# Patient Record
Sex: Male | Born: 1940 | Race: White | Hispanic: No | Smoking: Former smoker
Health system: Southern US, Community
[De-identification: ages and names within clinical notes are randomized; demographics above are authoritative.]

## PROBLEM LIST (undated history)

## (undated) DIAGNOSIS — I10 Essential (primary) hypertension: Secondary | ICD-10-CM

## (undated) DIAGNOSIS — J189 Pneumonia, unspecified organism: Secondary | ICD-10-CM

## (undated) DIAGNOSIS — H409 Unspecified glaucoma: Secondary | ICD-10-CM

## (undated) DIAGNOSIS — H3530 Unspecified macular degeneration: Secondary | ICD-10-CM

## (undated) DIAGNOSIS — R002 Palpitations: Secondary | ICD-10-CM

## (undated) DIAGNOSIS — R011 Cardiac murmur, unspecified: Secondary | ICD-10-CM

## (undated) DIAGNOSIS — K219 Gastro-esophageal reflux disease without esophagitis: Secondary | ICD-10-CM

## (undated) DIAGNOSIS — I723 Aneurysm of iliac artery: Secondary | ICD-10-CM

## (undated) DIAGNOSIS — I8290 Acute embolism and thrombosis of unspecified vein: Secondary | ICD-10-CM

## (undated) DIAGNOSIS — C4490 Unspecified malignant neoplasm of skin, unspecified: Secondary | ICD-10-CM

## (undated) DIAGNOSIS — I714 Abdominal aortic aneurysm, without rupture, unspecified: Secondary | ICD-10-CM

## (undated) DIAGNOSIS — R001 Bradycardia, unspecified: Secondary | ICD-10-CM

## (undated) DIAGNOSIS — C801 Malignant (primary) neoplasm, unspecified: Secondary | ICD-10-CM

## (undated) DIAGNOSIS — L989 Disorder of the skin and subcutaneous tissue, unspecified: Secondary | ICD-10-CM

## (undated) DIAGNOSIS — R42 Dizziness and giddiness: Secondary | ICD-10-CM

## (undated) DIAGNOSIS — R200 Anesthesia of skin: Secondary | ICD-10-CM

## (undated) DIAGNOSIS — E785 Hyperlipidemia, unspecified: Secondary | ICD-10-CM

## (undated) DIAGNOSIS — G4730 Sleep apnea, unspecified: Secondary | ICD-10-CM

## (undated) DIAGNOSIS — I499 Cardiac arrhythmia, unspecified: Secondary | ICD-10-CM

## (undated) DIAGNOSIS — I493 Ventricular premature depolarization: Secondary | ICD-10-CM

## (undated) DIAGNOSIS — I4891 Unspecified atrial fibrillation: Secondary | ICD-10-CM

## (undated) DIAGNOSIS — R0683 Snoring: Secondary | ICD-10-CM

## (undated) HISTORY — DX: Unspecified glaucoma: H40.9

## (undated) HISTORY — DX: Essential (primary) hypertension: I10

## (undated) HISTORY — DX: Abdominal aortic aneurysm, without rupture: I71.4

## (undated) HISTORY — PX: POLYPECTOMY: SHX149

## (undated) HISTORY — PX: COLONOSCOPY: SHX174

## (undated) HISTORY — DX: Snoring: R06.83

## (undated) HISTORY — DX: Cardiac murmur, unspecified: R01.1

## (undated) HISTORY — DX: Bradycardia, unspecified: R00.1

## (undated) HISTORY — DX: Sleep apnea, unspecified: G47.30

## (undated) HISTORY — PX: SKIN CANCER EXCISION: SHX779

## (undated) HISTORY — DX: Palpitations: R00.2

## (undated) HISTORY — DX: Dizziness and giddiness: R42

## (undated) HISTORY — DX: Disorder of the skin and subcutaneous tissue, unspecified: L98.9

## (undated) HISTORY — DX: Abdominal aortic aneurysm, without rupture, unspecified: I71.40

## (undated) HISTORY — PX: WISDOM TOOTH EXTRACTION: SHX21

## (undated) HISTORY — DX: Hyperlipidemia, unspecified: E78.5

## (undated) HISTORY — PX: TONSILLECTOMY: SHX5217

## (undated) HISTORY — DX: Gastro-esophageal reflux disease without esophagitis: K21.9

## (undated) HISTORY — DX: Unspecified atrial fibrillation: I48.91

---
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
## 1963-12-19 HISTORY — PX: APPENDECTOMY: SHX54

## 2001-12-31 ENCOUNTER — Ambulatory Visit (HOSPITAL_COMMUNITY): Admission: RE | Admit: 2001-12-31 | Discharge: 2001-12-31 | Payer: Self-pay | Admitting: *Deleted

## 2001-12-31 ENCOUNTER — Encounter: Payer: Self-pay | Admitting: *Deleted

## 2002-01-09 ENCOUNTER — Encounter: Payer: Self-pay | Admitting: *Deleted

## 2002-01-09 ENCOUNTER — Ambulatory Visit (HOSPITAL_COMMUNITY): Admission: RE | Admit: 2002-01-09 | Discharge: 2002-01-09 | Payer: Self-pay | Admitting: *Deleted

## 2005-01-31 ENCOUNTER — Ambulatory Visit: Payer: Self-pay | Admitting: *Deleted

## 2005-02-17 ENCOUNTER — Ambulatory Visit: Payer: Self-pay | Admitting: *Deleted

## 2007-07-12 ENCOUNTER — Ambulatory Visit: Payer: Self-pay | Admitting: Cardiovascular Disease

## 2007-07-24 ENCOUNTER — Ambulatory Visit: Payer: Self-pay | Admitting: Cardiovascular Disease

## 2007-07-24 LAB — CONVERTED CEMR LAB
ALT: 23 units/L (ref 0–53)
AST: 26 units/L (ref 0–37)
Albumin: 3.8 g/dL (ref 3.5–5.2)
Alkaline Phosphatase: 81 units/L (ref 39–117)
Bilirubin, Direct: 0.1 mg/dL (ref 0.0–0.3)
Cholesterol: 182 mg/dL (ref 0–200)
HDL: 35.1 mg/dL — ABNORMAL LOW (ref >39.0)
LDL Cholesterol: 122 mg/dL — ABNORMAL HIGH (ref 0–99)
Total Bilirubin: 0.9 mg/dL (ref 0.3–1.2)
Total CHOL/HDL Ratio: 5.2
Total Protein: 7.3 g/dL (ref 6.0–8.3)
Triglycerides: 125 mg/dL (ref 0–149)
VLDL: 25 mg/dL (ref 0–40)

## 2007-10-11 ENCOUNTER — Ambulatory Visit: Payer: Self-pay | Admitting: Cardiovascular Disease

## 2007-10-22 ENCOUNTER — Ambulatory Visit: Payer: Self-pay | Admitting: Gastroenterology

## 2007-11-05 ENCOUNTER — Ambulatory Visit: Payer: Self-pay | Admitting: Gastroenterology

## 2009-06-02 ENCOUNTER — Encounter: Payer: Self-pay | Admitting: Physician Assistant

## 2009-06-02 ENCOUNTER — Telehealth: Payer: Self-pay | Admitting: Cardiovascular Disease

## 2009-06-02 ENCOUNTER — Ambulatory Visit: Payer: Self-pay | Admitting: Cardiology

## 2009-06-02 DIAGNOSIS — E785 Hyperlipidemia, unspecified: Secondary | ICD-10-CM

## 2009-06-02 DIAGNOSIS — R42 Dizziness and giddiness: Secondary | ICD-10-CM

## 2009-06-02 DIAGNOSIS — I10 Essential (primary) hypertension: Secondary | ICD-10-CM | POA: Insufficient documentation

## 2009-06-02 DIAGNOSIS — R002 Palpitations: Secondary | ICD-10-CM

## 2009-06-02 DIAGNOSIS — I679 Cerebrovascular disease, unspecified: Secondary | ICD-10-CM | POA: Insufficient documentation

## 2009-07-15 ENCOUNTER — Ambulatory Visit: Payer: Self-pay | Admitting: Cardiovascular Disease

## 2009-08-31 ENCOUNTER — Ambulatory Visit: Payer: Self-pay | Admitting: Cardiovascular Disease

## 2009-08-31 DIAGNOSIS — I4891 Unspecified atrial fibrillation: Secondary | ICD-10-CM

## 2009-08-31 DIAGNOSIS — R011 Cardiac murmur, unspecified: Secondary | ICD-10-CM

## 2010-04-07 ENCOUNTER — Telehealth: Payer: Self-pay | Admitting: Cardiovascular Disease

## 2010-04-13 ENCOUNTER — Telehealth: Payer: Self-pay | Admitting: Cardiovascular Disease

## 2010-04-20 ENCOUNTER — Ambulatory Visit: Payer: Self-pay | Admitting: Cardiovascular Disease

## 2010-11-11 ENCOUNTER — Encounter (INDEPENDENT_AMBULATORY_CARE_PROVIDER_SITE_OTHER): Payer: Self-pay | Admitting: *Deleted

## 2011-01-05 ENCOUNTER — Ambulatory Visit
Admission: RE | Admit: 2011-01-05 | Discharge: 2011-01-05 | Payer: Self-pay | Source: Home / Self Care | Attending: Cardiovascular Disease | Admitting: Cardiovascular Disease

## 2011-01-05 ENCOUNTER — Encounter: Payer: Self-pay | Admitting: Cardiovascular Disease

## 2011-01-17 NOTE — Assessment & Plan Note (Signed)
Summary: ROV/DIZZINESS AND BP ISSUES/DM   Visit Type:  Follow-up Primary Provider:  Shaune Pollack, MD  CC:  Dizziness/BP issues.  History of Present Illness: Jonathan Lane is seen today for F/U of dizzyness and HTN.  He seems overly concerned about his BP.  He occasionally gets hight readings at home.  In our office he is always non-postural and in the 120 sytolic range.  I did speak with him last time about the effects of ETOH making BP more labile.  He had his prostate biopsy with Dr Cassell Smiles and fortuately his does not have CA.  I believe this stress may have had something to do with it.  He does have some dizzyness with change in postion and I suspect this represents an inner ear problem.  He denies SSCP, diaphoresis, dyspnea ore edema. He has been compliant with his meds  Current Problems (verified): 1)  Cardiac Murmur  (ICD-785.2) 2)  Atrial Fibrillation  (ICD-427.31) 3)  Dizziness  (ICD-780.4) 4)  Bradycardia..relative  (ICD-427.89) 5)  Hypertension, Unspecified  (ICD-401.9) 6)  Hyperlipidemia-mixed  (ICD-272.4) 7)  Palpitations  (ICD-785.1)  Current Medications (verified): 1)  Cardizem Cd 240 Mg Xr24h-Cap (Diltiazem Hcl Coated Beads) .... One Tablet By Mouth Once Daily 2)  Losartan Potassium-Hctz 100-12.5 Mg Tabs (Losartan Potassium-Hctz) .... One Tablet By Mouth Once Daily 3)  Aspir-Low 81 Mg Tbec (Aspirin) .Marland Kitchen.. 1 Tab Morning 4)  Aspirin 325 Mg Tabs (Aspirin) .Marland Kitchen.. 1 Tab By Mouth Once Daily 5)  Coq10 30 Mg Caps (Coenzyme Q10) .... Take One Daily 6)  Compete  Tabs (Multiple Vitamins-Minerals) .... Take One Daily 7)  Lumigan 0.01 % Soln (Bimatoprost) .... Uad 8)  Fish Oil   Oil (Fish Oil) .... Tab By Mouth Once Daily 9)  Pomegranate 250 Mg Caps (Pomegranate (Punica Granatum)) .Marland Kitchen.. 1 Tab By Mouth Once Daily 10)  Cranberry 405 Mg Caps (Cranberry) .Marland Kitchen.. 1 Tab By Mouth Once Daily 11)  Sm Stool Softener 100 Mg Caps (Docusate Sodium) .... Uad 12)  Istalol 0.5 % Soln (Timolol Maleate) ....  Uad 13)  Prostate Health  Caps (Misc Natural Products) .... Once Daily  Allergies (verified): No Known Drug Allergies  Past History:  Past Medical History: Last updated: 06/02/2009 BRADYCARDIA.Marland KitchenRELATIVE (ICD-427.89) HYPERTENSION, UNSPECIFIED (ICD-401.9) HYPERLIPIDEMIA-MIXED (ICD-272.4) PALPITATIONS (ICD-785.1)    Past Surgical History: Last updated: 06/02/2009 Tonsillectomy..either [id number] or 1959 Appendectomy..1965  Family History: Last updated: 06/02/2009 Family History of CVA or Stroke: Mother deceased at 43..stroke, Father deceased at 87.. stroke  Social History: Last updated: 06/02/2009 Retired .Marland Kitchen2000 Married  Tobacco Use - Former. quit 1967 Alcohol Use - yes Regular Exercise - yes Drug Use - no  Review of Systems       Denies fever, malais, weight loss, blurry vision, decreased visual acuity, cough, sputum, SOB, hemoptysis, pleuritic pain, palpitaitons, heartburn, abdominal pain, melena, lower extremity edema, claudication, or rash.   Vital Signs:  Patient profile:   70 year old male Height:      74 inches Weight:      198 pounds BMI:     25.51 Pulse rate:   61 / minute BP sitting:   122 / 70  (left arm) BP standing:   120 / 72  Vitals Entered By: Laurance Flatten CMA (Apr 20, 2010 8:17 AM)  Physical Exam  General:  Affect appropriate Healthy:  appears stated age HEENT: normal Neck supple with no adenopathy JVP normal no bruits no thyromegaly Lungs clear with no wheezing and good diaphragmatic motion Heart:  S1/S2  no murmur,rub, gallop or click PMI normal Abdomen: benighn, BS positve, no tenderness, no AAA no bruit.  No HSM or HJR Distal pulses intact with no bruits No edema Neuro non-focal Skin warm and dry    Impression & Recommendations:  Problem # 1:  HYPERTENSION, UNSPECIFIED (ICD-401.9) Well controlled.  Will correlate his home BP cuff with ours next visit His updated medication list for this problem includes:    Cardizem Cd 240 Mg  Xr24h-cap (Diltiazem hcl coated beads) ..... One tablet by mouth once daily    Losartan Potassium-hctz 100-12.5 Mg Tabs (Losartan potassium-hctz) ..... One tablet by mouth once daily    Aspir-low 81 Mg Tbec (Aspirin) .Marland Kitchen... 1 tab morning    Aspirin 325 Mg Tabs (Aspirin) .Marland Kitchen... 1 tab by mouth once daily  Problem # 2:  DIZZINESS (ICD-780.4) Non-cardiac with no postural symptoms.  Likely related to inner ear or vetigo. F/U primary  Problem # 3:  HYPERLIPIDEMIA-MIXED (ICD-272.4) Last LDL 122.  Continue diet Rx in light of no vascular diseae.  F/U labs in 6 months  Patient Instructions: 1)  Your physician recommends that you schedule a follow-up appointment in: 6 months. 2)  Your physician recommends that you continue on your current medications as directed. Please refer to the Current Medication list given to you today.

## 2011-01-17 NOTE — Progress Notes (Signed)
Summary: pls call spouse  Phone Note Call from Patient Call back at Home Phone [phone]   Caller: Patient Reason for Call: Talk to Nurse, Talk to Doctor Summary of Call: pt will be out so per patient it is ok to talk to spouse Bonita Quin  Initial call taken by: Omer Jack,  April 13, 2010 9:51 AM  Follow-up for Phone Call        spoke with pt wife, she is aware of the med changes suggested by dr Eden Emms. the pt is unavailable at this time to talk with me. appt made for follow up next week per spouse request. will call pt back later today to discuss meds Deliah Goody, RN  April 13, 2010 10:46 AM  spoke with pt, he is willing to make the med changes and will keep his appt next week to follow up. he will call prior to the appt with problems Deliah Goody, RN  April 13, 2010 5:26 PM     New/Updated Medications: LOSARTAN POTASSIUM-HCTZ 100-12.5 MG TABS (LOSARTAN POTASSIUM-HCTZ) one tablet by mouth once daily Prescriptions: LOSARTAN POTASSIUM-HCTZ 100-12.5 MG TABS (LOSARTAN POTASSIUM-HCTZ) one tablet by mouth once daily  #30 x 12   Entered by:   Deliah Goody, RN   Authorized by:   Colon Branch, MD, [facility]   Signed by:   Deliah Goody, RN on 04/13/2010   Method used:   Electronically to        [facility] (retail)       [address]       [place], [place]  [id number]       Ph: [phone]       Fax: [phone]   RxID:   [id number]

## 2011-01-17 NOTE — Letter (Signed)
Summary: Appointment - Missed  [facility], Main Office  [address]   [place], [place] [address]   Phone: [phone]  Fax: [phone]         November 11, 2010 MRN: [id number]       Jonathan Lane [address] [place], [place]  [address]     Dear Jonathan Lane,  Our records indicate you missed your appointment on October 25, 2010 with Dr. Eden Emms.  It is very important that we reach you to reschedule this appointment. We look forward to participating in your health care needs. Please contact us at the number listed above at your earliest convenience to reschedule this appointment.     Sincerely,   Glass blower/designer

## 2011-01-17 NOTE — Progress Notes (Signed)
Summary: b/p today 173/108 - wants to be seen today  Phone Note Call from Patient Call back at Home Phone [phone]   Caller: Patient Reason for Call: Talk to Nurse Summary of Call: B/P today 173/108. at home. dizziness, "feeling something in his heart" . pt on new meds. would like to come into the office if possible.  Initial call taken by: Lorne Skeens,  April 07, 2010 8:39 AM  Follow-up for Phone Call        spoke with pt, he had dizziness yesterday and when he got up this am he also felt dizzy. he took his bp this am and it was 173/108. he has noticed that his bp has been running elevated for the last several weeks. he also states he has noticed a funny feeling in his heart that he associates with his elevated bp. he feels the cardizem is not working as well as it first did. the dizziness the pt is having is when he looks up or when he gets out of bed to go to the bathroom. he states he feels like something is going on inside his head. instructed pt to contact primary care md for eval of vertigo. will foward to dr Eden Emms to review bp meds for titration. Deliah Goody, RN  April 07, 2010 10:54 AM   please call pt @ [phone] [address]  April 07, 2010 1:12 PM Follow-up by: Deliah Goody, RN,  April 07, 2010 9:54 AM  Additional Follow-up for Phone Call Additional follow up Details #1::        Change ramapril to Hyzaar 100/12.5 continue cardiazem.  F/U primary and F/U me 4-6 weeks.  Order carotid duplex Additional Follow-up by: Colon Branch, MD, [facility],  April 08, 2010 11:40 AM     Appended Document: b/p today 173/108 - wants to be seen today Left message to call back

## 2011-01-19 NOTE — Assessment & Plan Note (Signed)
Summary: f27m/mj   Primary Provider:  Shaune Pollack, MD  CC:  CHECK UP.  History of Present Illness: Jonathan Lane is seen today for F/U of dizzyness and HTN.  He seems overly concerned about his BP.  He occasionally gets hight readings at home.  In our office he is always non-postural and in the 120 sytolic range.  I did speak with him last time about the effects of ETOH making BP more labile.  He had his prostate biopsy with Dr Cassell Smiles and fortuately his does not have CA.  I believe this stress may have had something to do with it.  His visual problems and dizzyness are resolved  He denies SSCP, diaphoresis, dyspnea ore edema. He has been compliant with his meds  Has some soreness in the medial aspect of his right elbow "golfers elbow" even though he only plays tennis.  Has decreased ASA to 81 mg.  Suggested Naproxen for NSAI   Current Problems (verified): 1)  Cardiac Murmur  (ICD-785.2) 2)  Atrial Fibrillation  (ICD-427.31) 3)  Dizziness  (ICD-780.4) 4)  Bradycardia..relative  (ICD-427.89) 5)  Hypertension, Unspecified  (ICD-401.9) 6)  Hyperlipidemia-mixed  (ICD-272.4) 7)  Palpitations  (ICD-785.1)  Current Medications (verified): 1)  Cardizem Cd 240 Mg Xr24h-Cap (Diltiazem Hcl Coated Beads) .... One Tablet By Mouth Once Daily 2)  Losartan Potassium-Hctz 100-12.5 Mg Tabs (Losartan Potassium-Hctz) .... One Tablet By Mouth Once Daily 3)  Aspir-Low 81 Mg Tbec (Aspirin) .Marland Kitchen.. 1 Tab Morning 4)  Coq10 30 Mg Caps (Coenzyme Q10) .... Take One Daily 5)  Compete  Tabs (Multiple Vitamins-Minerals) .... Take One Daily 6)  Lumigan 0.01 % Soln (Bimatoprost) .... Uad 7)  Fish Oil   Oil (Fish Oil) .... Tab By Mouth Once Daily 8)  Pomegranate 250 Mg Caps (Pomegranate (Punica Granatum)) .Marland Kitchen.. 1 Tab By Mouth Once Daily 9)  Cranberry 405 Mg Caps (Cranberry) .Marland Kitchen.. 1 Tab By Mouth Once Daily 10)  Sm Stool Softener 100 Mg Caps (Docusate Sodium) .... Uad 11)  Prostate Health  Caps (Misc Natural Products) .... Once  Daily 12)  Combigan 0.2-0.5 % Soln (Brimonidine Tartrate-Timolol) .... As Direected 13)  Valacyclovir Hcl 1 Gm Tabs (Valacyclovir Hcl) .... As Needed  Allergies (verified): No Known Drug Allergies  Past History:  Past Medical History: Last updated: 06/02/2009 BRADYCARDIA.Marland KitchenRELATIVE (ICD-427.89) HYPERTENSION, UNSPECIFIED (ICD-401.9) HYPERLIPIDEMIA-MIXED (ICD-272.4) PALPITATIONS (ICD-785.1)    Past Surgical History: Last updated: 06/02/2009 Tonsillectomy..either [id number] or 1959 Appendectomy..1965  Family History: Last updated: 06/02/2009 Family History of CVA or Stroke: Mother deceased at 75..stroke, Father deceased at 62.. stroke  Social History: Last updated: 06/02/2009 Retired .Marland Kitchen2000 Married  Tobacco Use - Former. quit 1967 Alcohol Use - yes Regular Exercise - yes Drug Use - no  Review of Systems       Denies fever, malais, weight loss, blurry vision, decreased visual acuity, cough, sputum, SOB, hemoptysis, pleuritic pain, palpitaitons, heartburn, abdominal pain, melena, lower extremity edema, claudication, or rash.   Vital Signs:  Patient profile:   70 year old male Height:      74 inches Weight:      204 pounds BMI:     26.29 Pulse rate:   57 / minute Pulse (ortho):   64 / minute Resp:     14 per minute BP sitting:   130 / 72  (left arm) BP standing:   132 / 70  Vitals Entered By: Kem Parkinson (January 05, 2011 4:31 PM)  Physical Exam  General:  Affect appropriate Healthy:  appears  stated age HEENT: normal Neck supple with no adenopathy JVP normal no bruits no thyromegaly Lungs clear with no wheezing and good diaphragmatic motion Heart:  S1/S2 no murmur,rub, gallop or click PMI normal Abdomen: benighn, BS positve, no tenderness, no AAA no bruit.  No HSM or HJR Distal pulses intact with no bruits No edema Neuro non-focal Skin warm and dry    Impression & Recommendations:  Problem # 1:  ATRIAL FIBRILLATION (ICD-427.31) Maint NSR The  following medications were removed from the medication list:    Aspirin 325 Mg Tabs (Aspirin) .Marland Kitchen... 1 tab by mouth once daily His updated medication list for this problem includes:    Aspir-low 81 Mg Tbec (Aspirin) .Marland Kitchen... 1 tab morning  Problem # 2:  DIZZINESS (ICD-780.4) Resolved likely related to inner ear.  Not postural and BP under adequate control  Problem # 3:  HYPERTENSION, UNSPECIFIED (ICD-401.9) Well controlled The following medications were removed from the medication list:    Aspirin 325 Mg Tabs (Aspirin) .Marland Kitchen... 1 tab by mouth once daily His updated medication list for this problem includes:    Cardizem Cd 240 Mg Xr24h-cap (Diltiazem hcl coated beads) ..... One tablet by mouth once daily    Losartan Potassium-hctz 100-12.5 Mg Tabs (Losartan potassium-hctz) ..... One tablet by mouth once daily    Aspir-low 81 Mg Tbec (Aspirin) .Marland Kitchen... 1 tab morning  Problem # 4:  HYPERLIPIDEMIA-MIXED (ICD-272.4) At goal with no side effects CHOL: 182 (07/24/2007)   LDL: 122 (07/24/2007)   HDL: 35.1 (07/24/2007)   TG: 125 (07/24/2007)  Problem # 5:  PALPITATIONS (ICD-785.1) Benign  Continue Calcium blocker The following medications were removed from the medication list:    Aspirin 325 Mg Tabs (Aspirin) .Marland Kitchen... 1 tab by mouth once daily His updated medication list for this problem includes:    Cardizem Cd 240 Mg Xr24h-cap (Diltiazem hcl coated beads) ..... One tablet by mouth once daily    Aspir-low 81 Mg Tbec (Aspirin) .Marland Kitchen... 1 tab morning  Patient Instructions: 1)  Your physician wants you to follow-up in: 6 MONTHS  You will receive a reminder letter in the mail two months in advance. If you don't receive a letter, please call our office to schedule the follow-up appointment.

## 2011-05-02 NOTE — Assessment & Plan Note (Signed)
[facility]                            CARDIOLOGY OFFICE NOTE   NAME:Masterson, ROBERTA KELLY                    MRN:          [id number]  DATE:07/12/2007                            DOB:          31-Jul-1941    Mr. Carriere is seen today as a new patient.  He is referred by Dr.  Georgina Pillion for evaluation of hypertension, hypercholesterolemia, and  relative bradycardia.   The patient has previously been seen by Dr. Kennyth Arnold back in 2006.   He has not had a recent stress test.  The patient has had hypertension  for at least 30 years.  He has been maintained on atenolol and Ramipril.   He takes his blood pressure at home.  In general he has noticed that his  pulse has been fairly low and his blood pressure can spike during  exercise.  His weight is up a little bit.  He wants to increase his  exercise activity.   The patient has no documented coronary artery disease.  He is a  nonsmoker, having quit in 1967.  He is a nondiabetic.  His cholesterol  has been elevated in the past.  I do not have any recent lab work on  him.  He said that he started taking Fish oil a couple of years ago and  this had markedly improved his memory and that was all he was taking for  his cholesterol.   In talking to the patient, he is retired.  His activity level has been  somewhat low.  He is trying to increase it, particularly by walking and  playing tennis.   He wanted to square away his blood pressure medicines before he did any  more activities.   His review of systems otherwise negative.   PAST MEDICAL HISTORY:  Is fairly benign.  He has had  hypercholesterolemia, hypertension, previous smoking, tonsillectomy and  appendectomy.   He is retired. He is married to [facility] who is also a patient of  mine.  He retired in 2000.  He likes to play tennis.  He is a nonsmoker,  nondrinker.   FAMILY HISTORY:  Remarkable for a mother dying at age 65 of a stroke.  Father  dying at age 70 of a stroke.   He is currently taking:  1. Atenolol 50 daily.  2. Ramipril 10 daily.  3. Coenzyme Q.  4. Fish oil.  5. Aspirin daily.  6. Cranberry extract.  7. Zinc.   He denies any allergies.   Review of systems otherwise negative.   EXAMINATION:  Is remarkable for a healthy appearing middle-aged white  male in no distress.  His respiratory rate is 14, pulse is quite low at 49.  His weight is  equal to 207.  Blood pressure is 126/72.  He is afebrile.  HEENT:  Normal.  Carotids normal without bruit.  There is no lymphadenopathy, no  thyromegaly, no JVP elevation.  LUNGS:  Clear.  Good diaphragmatic motion, no wheezing.  There is an S1, S2 with normal heart sounds, PMI is normal.  ABDOMEN:  Benign. There is no AAA,  no tenderness.  Bowel sounds are  positive. There is no hepatosplenomegaly, hepatojugular reflex.  Distal  pulses are intact with no edema.  PTs are +3, femorals are +3.  NEURO:  Nonfocal.  There is no muscular weakness.   His baseline EKG shows sinus bradycardia at a rate of 49 with low atrial  focus, otherwise normal.   IMPRESSION:  1. Hypertension with relative bradycardia.  Decrease atenolol to 25 a      day, increase Ramipril to 10 b.i.d.  Follow up in 8 to 10 weeks.  2. Relative bradycardia secondary to beta blockade.  I think that this      is probably giving him some chronotropic incompetence and fatigue      with exercise.  Since he wants to improve his exercise tolerance, I      think it is reasonable to lower his beta blocker and note number 1,      to compensate we will increase his Ramipril for blood pressure      control.  3. History of hypercholesterolemia, not on statin.  Fish oil is fine      but we need to recheck a fasting lipid and liver profile.  4. Previous issues in regards to muscle cramps, no evidence of      claudication or peripheral vascular disease.  Continue coenzyme Q      as he feels better on it.  5. Stroke  prophylaxis.  Continue baby aspirin a day.   I will see him back in 8 to 10 weeks to further assess his blood  pressure and heart rate and talk to him about his cholesterol.     Noralyn Pick. Eden Emms, MD, [facility]  Electronically Signed    PCN/MedQ  DD: 07/12/2007  DT: 07/12/2007  Job #: [id number]

## 2011-05-02 NOTE — Assessment & Plan Note (Signed)
[facility]                            CARDIOLOGY OFFICE NOTE   NAME:Jonathan Lane, Jonathan Lane                    MRN:          [id number]  DATE:10/11/2007                            DOB:          05/07/1941    Jonathan Lane returns today for followup.  I followed him for  palpitations, hypercholesterolemia, and hypertension.  His biggest issue  has been relative bradycardia and his competitive tennis playing.  His  weight has been stable.  He is watching his salt.  The last time I saw  him his heart rate was 48.  We cut his atenolol back to 25 mg a day and  put him on ramipril 10 mg a day.  He seems to be doing well with this.   REVIEW OF SYSTEMS:  He has not had any lightheadedness, palpitations,  PND, or orthopnea.  He is due to have Dr. Georgina Pillion check his lipids again.  He prefers to take fish oil alone.  Review of systems otherwise  negative.   CURRENT MEDICATIONS:  1. Coenzyme-Q.  2. An aspirin a day.  3. Cranberry juice.  4. Ramipril 10 a day.  5. Atenolol 25 a day.  I suggested that he may take his atenolol at      night, rather than be a little sluggish during his tennis      tournaments.   PHYSICAL EXAMINATION:  VITAL SIGNS:  His weight is 206, blood pressure  is 116/66, pulse is up 58, afebrile, respiratory rate 14.  HEENT:  Normal.  NECK:  Carotids normal without bruit.  No lymphadenopathy.  No  thyromegaly.  No JVP elevation.  HEART:  S1 S2 with normal heart sounds.  LUNGS:  Clear with good diaphragmatic motion.  ABDOMEN:  Benign.  No renal bruits.  No tenderness.  Bowel sounds  positive.  Femorals are plus 3.  EXTREMITIES:  PTs are plus 3.  NEUROLOGIC:  Nonfocal.  No muscular weakness.   IMPRESSION:  1. Hypertension, currently fairly well controlled.  Continue ramipril      and low dose beta-blocker.  2. Hypercholesterolemia.  Continue fish oil.  Followup lipid and liver      profile with Dr. Georgina Pillion.  3. Pervious muscle cramping,  improved on Coenzyme-Q.  No evidence of      vascular heart disease.   I will see the patient back in about 6 months to reassess his blood  pressure.     Jonathan Lane. Eden Emms, MD, [facility]  Electronically Signed    PCN/MedQ  DD: 10/11/2007  DT: 10/11/2007  Job #: [id number]

## 2011-05-16 ENCOUNTER — Other Ambulatory Visit: Payer: Self-pay | Admitting: Cardiovascular Disease

## 2011-05-16 ENCOUNTER — Telehealth: Payer: Self-pay | Admitting: Cardiovascular Disease

## 2011-05-16 MED ORDER — LOSARTAN POTASSIUM-HCTZ 100-12.5 MG PO TABS: 1.0000 | ORAL_TABLET | Freq: Every day | ORAL | Status: DC

## 2011-05-16 NOTE — Telephone Encounter (Signed)
Pt needs losartan to be call in to [facility] # [phone]

## 2011-05-16 NOTE — Telephone Encounter (Signed)
rx sent in today, pt needs ov with Jonathan Lane

## 2011-11-06 ENCOUNTER — Other Ambulatory Visit: Payer: Self-pay

## 2011-11-06 MED ORDER — LOSARTAN POTASSIUM-HCTZ 100-12.5 MG PO TABS: 1.0000 | ORAL_TABLET | Freq: Every day | ORAL | Status: DC

## 2011-11-08 ENCOUNTER — Other Ambulatory Visit: Payer: Self-pay

## 2011-11-15 ENCOUNTER — Telehealth: Payer: Self-pay | Admitting: *Deleted

## 2011-11-15 ENCOUNTER — Other Ambulatory Visit: Payer: Self-pay | Admitting: Cardiovascular Disease

## 2011-11-15 MED ORDER — LOSARTAN POTASSIUM-HCTZ 100-12.5 MG PO TABS: 1.0000 | ORAL_TABLET | Freq: Every day | ORAL | Status: DC

## 2011-11-15 NOTE — Telephone Encounter (Signed)
[facility] pharmacy called for a prescription re- fill on pt for Hyzaar 100-12.5 mg one tablet po daily. Order for medication given to pharmacist, with 5 refills.

## 2011-12-29 ENCOUNTER — Encounter: Payer: Self-pay | Admitting: *Deleted

## 2012-01-01 ENCOUNTER — Encounter: Payer: Self-pay | Admitting: Cardiovascular Disease

## 2012-01-01 ENCOUNTER — Ambulatory Visit (INDEPENDENT_AMBULATORY_CARE_PROVIDER_SITE_OTHER): Payer: Medicare Other | Admitting: Cardiovascular Disease

## 2012-01-01 DIAGNOSIS — I10 Essential (primary) hypertension: Secondary | ICD-10-CM

## 2012-01-01 DIAGNOSIS — E785 Hyperlipidemia, unspecified: Secondary | ICD-10-CM

## 2012-01-01 DIAGNOSIS — I4891 Unspecified atrial fibrillation: Secondary | ICD-10-CM

## 2012-01-01 NOTE — Assessment & Plan Note (Signed)
Cholesterol is at goal.  Continue current dose of statin and diet Rx.  No myalgias or side effects.  F/U  LFT's in 6 months. Lab Results  Component Value Date   LDLCALC 122* 07/24/2007             

## 2012-01-01 NOTE — Assessment & Plan Note (Signed)
Maint NSR continue cardizem

## 2012-01-01 NOTE — Patient Instructions (Signed)
Your physician wants you to follow-up in:  6 MONTHS WITH DR NISHAN  You will receive a reminder letter in the mail two months in advance. If you don't receive a letter, please call our office to schedule the follow-up appointment. Your physician recommends that you continue on your current medications as directed. Please refer to the Current Medication list given to you today. 

## 2012-01-01 NOTE — Assessment & Plan Note (Signed)
Well controlled.  Continue current medications and low sodium Dash type diet.    

## 2012-01-01 NOTE — Progress Notes (Signed)
Jonathan Lane is seen today for F/U of dizzyness and HTN. He seems overly concerned about his BP. He occasionally gets hight readings at home. In our office he is always non-postural and in the 120 sytolic range. I did speak with him last time about the effects of ETOH making BP more labile. He had his prostate biopsy with Dr Cassell Smiles and fortuately his does not have CA. I believe this stress may have had something to do with it. His visual problems and dizzyness are resolved He denies SSCP, diaphoresis, dyspnea ore edema. He has been compliant with his meds Has some soreness in the medial aspect of his right elbow "golfers elbow" even though he only plays tennis. Has decreased ASA to 81 mg. Suggested Naproxen for NSAI  Weight is up and not exercising.    Wife Jonathan Lane is a patient of mine and has a difficult decision regarding spinal surgery  ROS: Denies fever, malais, weight loss, blurry vision, decreased visual acuity, cough, sputum, SOB, hemoptysis, pleuritic pain, palpitaitons, heartburn, abdominal pain, melena, lower extremity edema, claudication, or rash.  All other systems reviewed and negative  General: Affect appropriate Healthy:  appears stated age HEENT: normal Neck supple with no adenopathy JVP normal no bruits no thyromegaly Lungs clear with no wheezing and good diaphragmatic motion Heart:  S1/S2 no murmur,rub, gallop or click PMI normal Abdomen: benighn, BS positve, no tenderness, no AAA no bruit.  No HSM or HJR Distal pulses intact with no bruits No edema Neuro non-focal Skin warm and dry No muscular weakness   Current Outpatient Prescriptions  Medication Sig Dispense Refill  . aspirin 81 MG tablet Take 160 mg by mouth daily.      . bimatoprost (LUMIGAN) 0.01 % SOLN 1 drop at bedtime.      . Coenzyme Q10 (CO Q 10 PO) Take 1 Can by mouth daily.      . COSOPT PF 22.3-6.8 MG/ML SOLN Apply 1 drop to eye 2 (two) times daily. Each eye       . Cranberry 400 MG CAPS Take 1 capsule by  mouth daily.      Marland Kitchen diltiazem (CARDIZEM CD) 240 MG 24 hr capsule Take 240 mg by mouth daily.      Marland Kitchen docusate sodium (COLACE) 100 MG capsule Take 100 mg by mouth as needed.      . fish oil-omega-3 fatty acids 1000 MG capsule Take 1 g by mouth daily.       Marland Kitchen losartan-hydrochlorothiazide (HYZAAR) 100-12.5 MG per tablet Take 1 tablet by mouth daily.  30 tablet  11  . Misc Natural Products (PROSTATE HEALTH) CAPS Take 1 capsule by mouth daily.      . multivitamin (THERAGRAN) per tablet Take 1 tablet by mouth daily.      . Pomegranate 250 MG CAPS Take 1 capsule by mouth daily.      . valACYclovir (VALTREX) 1000 MG tablet Take 1,000 mg by mouth as needed.        Allergies  Combigan  Electrocardiogram:  NSR rate 62 normal ECG no LVH  Assessment and Plan

## 2012-02-23 ENCOUNTER — Other Ambulatory Visit: Payer: Self-pay | Admitting: Cardiovascular Disease

## 2012-02-23 MED ORDER — DILTIAZEM HCL ER COATED BEADS 240 MG PO CP24: 240.0000 mg | ORAL_CAPSULE | Freq: Every day | ORAL | Status: DC

## 2013-01-03 ENCOUNTER — Telehealth: Payer: Self-pay | Admitting: Cardiovascular Disease

## 2013-01-03 MED ORDER — LOSARTAN POTASSIUM-HCTZ 100-12.5 MG PO TABS: 1.0000 | ORAL_TABLET | Freq: Every day | ORAL | Status: DC

## 2013-01-03 NOTE — Telephone Encounter (Signed)
[facility] request for losartin 100-12.5 three times, no response , pt now out and going out of town today, pls call in asap

## 2013-01-27 ENCOUNTER — Other Ambulatory Visit: Payer: Self-pay | Admitting: *Deleted

## 2013-01-27 MED ORDER — DILTIAZEM HCL ER COATED BEADS 240 MG PO CP24: 240.0000 mg | ORAL_CAPSULE | Freq: Every day | ORAL | Status: DC

## 2013-06-16 ENCOUNTER — Other Ambulatory Visit: Payer: Self-pay | Admitting: Cardiovascular Disease

## 2013-09-19 ENCOUNTER — Other Ambulatory Visit: Payer: Self-pay | Admitting: Cardiovascular Disease

## 2013-10-22 ENCOUNTER — Other Ambulatory Visit: Payer: Self-pay | Admitting: Cardiovascular Disease

## 2013-11-19 ENCOUNTER — Other Ambulatory Visit: Payer: Self-pay | Admitting: Cardiovascular Disease

## 2013-12-04 ENCOUNTER — Ambulatory Visit (INDEPENDENT_AMBULATORY_CARE_PROVIDER_SITE_OTHER): Payer: Medicare Other | Admitting: Cardiovascular Disease

## 2013-12-04 ENCOUNTER — Encounter: Payer: Self-pay | Admitting: Cardiovascular Disease

## 2013-12-04 VITALS — BP 134/70 | HR 55 | Ht 74.0 in | Wt 198.0 lb

## 2013-12-04 DIAGNOSIS — I4891 Unspecified atrial fibrillation: Secondary | ICD-10-CM

## 2013-12-04 DIAGNOSIS — E785 Hyperlipidemia, unspecified: Secondary | ICD-10-CM

## 2013-12-04 MED ORDER — LOSARTAN POTASSIUM-HCTZ 100-12.5 MG PO TABS: 1.0000 | ORAL_TABLET | Freq: Every day | ORAL | Status: DC

## 2013-12-04 MED ORDER — DILTIAZEM HCL ER COATED BEADS 240 MG PO CP24: 240.0000 mg | ORAL_CAPSULE | Freq: Every day | ORAL | Status: DC

## 2013-12-04 NOTE — Assessment & Plan Note (Signed)
Main NSR with no palpitations  

## 2013-12-04 NOTE — Assessment & Plan Note (Signed)
Well controlled.  Continue current medications and low sodium Dash type diet.    

## 2013-12-04 NOTE — Patient Instructions (Signed)
Your physician wants you to follow-up in: YEAR WITH DR NISHAN  You will receive a reminder letter in the mail two months in advance. If you don't receive a letter, please call our office to schedule the follow-up appointment.  Your physician recommends that you continue on your current medications as directed. Please refer to the Current Medication list given to you today. 

## 2013-12-04 NOTE — Progress Notes (Signed)
Patient ID: Jonathan Lane, male   DOB: 1941/02/02, 72 y.o.   MRN: [id number] Jonathan Lane is seen today for F/U of dizzyness and HTN. He seems overly concerned about his BP. He occasionally gets hight readings at home. In our office he is always non-postural and in the 120 sytolic range. I did speak with him last time about the effects of ETOH making BP more labile. He had his prostate biopsy with Dr Jonathan Lane and fortuately his does not have CA. I believe this stress may have had something to do with it. His visual problems and dizzyness are resolved He denies SSCP, diaphoresis, dyspnea ore edema. He has been compliant with his meds Has some soreness in the medial aspect of his right elbow "golfers elbow" even though he only plays tennis. Has decreased ASA to 81 mg. Suggested Naproxen for NSAI Weight is up and not exercising.   Wife Jonathan Lane is a patient of mine with chronic back problems Recent travel to [place] and flu   Has lost weight by eating better   ROS: Denies fever, malais, weight loss, blurry vision, decreased visual acuity, cough, sputum, SOB, hemoptysis, pleuritic pain, palpitaitons, heartburn, abdominal pain, melena, lower extremity edema, claudication, or rash.  All other systems reviewed and negative  General: Affect appropriate Healthy:  appears stated age HEENT: normal Neck supple with no adenopathy JVP normal no bruits no thyromegaly Lungs clear with no wheezing and good diaphragmatic motion Heart:  S1/S2 no murmur, no rub, gallop or click PMI normal Abdomen: benighn, BS positve, no tenderness, no AAA no bruit.  No HSM or HJR Distal pulses intact with no bruits No edema Neuro non-focal Skin warm and dry No muscular weakness   Current Outpatient Prescriptions  Medication Sig Dispense Refill  . aspirin 81 MG tablet Take 160 mg by mouth daily.      . bimatoprost (LUMIGAN) 0.01 % SOLN 1 drop at bedtime.      . Coenzyme Q10 (CO Q 10 PO) Take 1 Can by mouth daily.      .  COSOPT PF 22.3-6.8 MG/ML SOLN Apply 1 drop to eye 2 (two) times daily. Each eye       . Cranberry 400 MG CAPS Take 1 capsule by mouth daily.      Marland Kitchen diltiazem (CARDIZEM CD) 240 MG 24 hr capsule Take 1 capsule (240 mg total) by mouth daily.  30 capsule  11  . docusate sodium (COLACE) 100 MG capsule Take 100 mg by mouth as needed.      . fish oil-omega-3 fatty acids 1000 MG capsule Take 1 g by mouth daily.       Marland Kitchen losartan-hydrochlorothiazide (HYZAAR) 100-12.5 MG per tablet TAKE 1 TABLET ONCE DAILY.  30 tablet  0  . Misc Natural Products (PROSTATE HEALTH) CAPS Take 1 capsule by mouth daily.      . multivitamin (THERAGRAN) per tablet Take 1 tablet by mouth daily.      . Pomegranate 250 MG CAPS Take 1 capsule by mouth daily.      . valACYclovir (VALTREX) 1000 MG tablet Take 1,000 mg by mouth as needed.       No current facility-administered medications for this visit.    Allergies  Combigan  Electrocardiogram:  SR rate 56 nonspecific ST/T wave changes   Assessment and Plan

## 2013-12-04 NOTE — Assessment & Plan Note (Signed)
Cholesterol is at goal.  Continue current dose of statin and diet Rx.  No myalgias or side effects.  F/U  LFT's in 6 months. Lab Results  Component Value Date   LDLCALC 122* 07/24/2007

## 2014-04-07 ENCOUNTER — Encounter: Payer: Self-pay | Admitting: *Deleted

## 2014-04-09 ENCOUNTER — Ambulatory Visit (INDEPENDENT_AMBULATORY_CARE_PROVIDER_SITE_OTHER): Payer: Medicare Other | Admitting: Neurology

## 2014-04-09 ENCOUNTER — Encounter: Payer: Self-pay | Admitting: Neurology

## 2014-04-09 ENCOUNTER — Encounter (INDEPENDENT_AMBULATORY_CARE_PROVIDER_SITE_OTHER): Payer: Self-pay

## 2014-04-09 VITALS — BP 125/79 | HR 52 | Resp 18 | Ht 74.0 in | Wt 200.0 lb

## 2014-04-09 DIAGNOSIS — R0609 Other forms of dyspnea: Secondary | ICD-10-CM

## 2014-04-09 DIAGNOSIS — R0683 Snoring: Secondary | ICD-10-CM | POA: Insufficient documentation

## 2014-04-09 DIAGNOSIS — R0989 Other specified symptoms and signs involving the circulatory and respiratory systems: Secondary | ICD-10-CM

## 2014-04-09 DIAGNOSIS — G4710 Hypersomnia, unspecified: Secondary | ICD-10-CM

## 2014-04-09 NOTE — Progress Notes (Signed)
[facility]  Provider:  Larey Seat, M D  Referring Provider: Marjorie Smolder, MD Primary Care Physician:  Marjorie Smolder, MD  Chief Complaint  Patient presents with  . New Evaluation    Room 11  . Sleep consult   Dear Dr. Inda Merlin , Thank you for allowing me to participate in your patient's sleep medical care.   HPI:  Jonathan Lane is a 73 y.o., caucasian, married, right handed male , who is seen here upon referral from Dr. Inda Merlin for a sleep evaluation,  The patient's usual bedtime is around 12:00, falls asleep promptly , rises in the morning between 7 and 8 AM. Appears not to be asleep for the full  interval. He states that he wakes up frequently about every 30 minutes after an initial arousal at about 4 AM. He's not sure why he wakes up so frequently; he is not in pain, he does not have shortness of breath or choking, nor nightmares. Goes to  the bathroom between one or 2 times at night.  He estimates his total sleep time to be close to 5.5 hours. He has been witnessed to snore and his wife reports him to have apnea. His wife reports that he would have crescendo breathing, that after a period of shallow breathing or stopping to breathe he would seemingly gasp for air and  jerk. He has daytime excessive sleepiness and severe fatigue.  The patient reports no refreshing sleep and non restorative sleep for well over 2 decades.   He was employed at [employer]. He retired in 2000. He was working irregular hours , 60 hrs. /week . No caffeine, his PVCs are resolved, rare ETOH, no tobacco use.   He has a known history of nasal septal deviation, had a tonsillectomy in childhood that caused a bleeding, and has a history of PVCs.  He is in the process of losing weight ( 30 pounds already ) , but has continued to snore, and he has nocturia, significant retrognathia. He has a mustache.  He has been evaluated for another adenoid  surgery .    His father was known to snore and had apnea, he was overweight. He dies after a stroke.   Review of Systems: Out of a complete 14 system review, the patient complains of only the following symptoms, and all other reviewed systems are negative. Epworth sleepiness score of 12 points, the fatigue severity score at 48 points and the geriatric depression scale at 2 points.     History   Social History  . Marital Status: Married    Spouse Name: [name]    Number of Children: 1  . Years of Education: Masters   Occupational History  .     Social History Main Topics  . Smoking status: Former Smoker    Types: Cigarettes    Quit date: 12/18/1965  . Smokeless tobacco: Never Used  . Alcohol Use: Yes     Comment: 5-7 drinks per week  . Drug Use: No  . Sexual Activity: Not on file   Other Topics Concern  . Not on file   Social History Narrative   Patient is married [name]).   Patient is retired.   Patient has one adult child.   Patient does not drink any caffeine.   Patient is right-handed.   Patient has a Scientist, water quality.             Family History  Problem Relation Age of Onset  . Stroke Father   . Stroke Mother   . Stroke Sister   . Breast cancer Sister   . High blood pressure Sister   . Diabetes Sister   . Colon cancer      Uncle    Past Medical History  Diagnosis Date  . Hyperlipidemia   . Hypertension   . Bradycardia   . Heart palpitations   . Cardiac murmur   . Atrial fibrillation   . Dizziness   . Bradycardia   . Snoring     Past Surgical History  Procedure Laterality Date  . Tonsillectomy      as a child  . Appendectomy  1965    Current Outpatient Prescriptions  Medication Sig Dispense Refill  . aspirin 81 MG tablet Take 160 mg by mouth daily.      . bimatoprost (LUMIGAN) 0.01 % SOLN 1 drop at bedtime.      . Coenzyme Q10 (CO Q 10 PO) Take 1 Can by mouth daily.      . COSOPT PF 22.3-6.8 MG/ML SOLN Apply 1 drop to eye 2 (two)  times daily. Each eye       . Cranberry 400 MG CAPS Take 1 capsule by mouth daily.      Marland Kitchen diltiazem (CARDIZEM CD) 240 MG 24 hr capsule Take 1 capsule (240 mg total) by mouth daily.  30 capsule  11  . docusate sodium (COLACE) 100 MG capsule Take 100 mg by mouth as needed.      . fish oil-omega-3 fatty acids 1000 MG capsule Take 1 g by mouth daily.       Marland Kitchen losartan-hydrochlorothiazide (HYZAAR) 100-12.5 MG per tablet Take 1 tablet by mouth daily.  30 tablet  11  . Misc Natural Products (PROSTATE HEALTH) CAPS Take 1 capsule by mouth daily.      . multivitamin (THERAGRAN) per tablet Take 1 tablet by mouth daily.      . Pomegranate 250 MG CAPS Take 1 capsule by mouth daily.      . valACYclovir (VALTREX) 1000 MG tablet Take 1,000 mg by mouth as needed.       No current facility-administered medications for this visit.    Allergies as of 04/09/2014 - Review Complete 04/09/2014  Allergen Reaction Noted  . Combigan [brimonidine tartrate-timolol]  01/01/2012    Vitals: BP 125/79  Pulse 52  Resp 18  Ht 6\' 2"  (1.88 m)  Wt 200 lb (90.719 kg)  BMI 25.67 kg/m2 Last Weight:  Wt Readings from Last 1 Encounters:  04/09/14 200 lb (90.719 kg)   Last Height:   Ht Readings from Last 1 Encounters:  04/09/14 6\' 2"  (1.88 m)    Physical exam:  General: The patient is awake, alert and appears not in acute distress. The patient is well groomed. Head: Normocephalic, atraumatic.  Neck is supple. Mallampati 3 , neck circumference: 16, retrognathia, reduced air space.   TMJ click on both sides.   Cardiovascular:  Regular rate and rhythm , without  murmurs or carotid bruit, and without distended neck veins. Respiratory: Lungs are clear to auscultation. Skin:  Without evidence of edema, or rash Trunk: BMI , normal posture.  Neurologic exam : The patient is awake and alert, oriented to place and time.   Memory subjective described as intact. There is a normal attention span & concentration ability.  Speech is fluent without dysarthria, dysphonia or aphasia. Mood and affect are appropriate.  Cranial nerves: Pupils are  equal and briskly reactive to light. Funduscopic exam without  evidence of pallor or edema.  Extraocular movements  in vertical and horizontal planes intact and without nystagmus. Visual fields by finger perimetry are intact. Hearing to finger rub intact.  Facial sensation intact to fine touch. Facial motor strength is symmetric and tongue and uvula move midline.  Motor exam:  Normal tone , muscle bulk and symmetric  in all extremities.  Sensory:  Fine touch, pinprick and vibration were tested in all extremities.  Proprioception is tested in the upper extremities only. This was normal.  Coordination: Rapid alternating movements in the fingers/hands is tested and normal. Finger-to-nose maneuver tested and normal without evidence of ataxia, dysmetria or tremor.  Gait and station: Patient walks without assistive device .  Deep tendon reflexes: in the  upper and lower extremities are symmetric and intact. Babinski maneuver response is  downgoing.   Assessment:  After physical and neurologic examination, review of laboratory studies, imaging, neurophysiology testing and pre-existing records, assessment is  1) witnessed apneas, snoring and gasping for air - retrognathia, TMJ- but he has reduced his BMI significantly.  OSA testing needed.   Plan:  Treatment plan and additional workup : 1)split at 15 and score at 3%, no CO2 is needed.

## 2014-04-09 NOTE — Patient Instructions (Addendum)
Sleep Apnea   Sleep apnea is a sleep disorder characterized by abnormal pauses in breathing while you sleep. When your breathing pauses, the level of oxygen in your blood decreases. This causes you to move out of deep sleep and into light sleep. As a result, your quality of sleep is poor, and the system that carries your blood throughout your body (cardiovascular system) experiences stress. If sleep apnea remains untreated, the following conditions can develop:  · High blood pressure (hypertension).  · Coronary artery disease.  · Inability to achieve or maintain an erection (impotence).  · Impairment of your thought process (cognitive dysfunction).  There are three types of sleep apnea:  1. Obstructive sleep apnea Pauses in breathing during sleep because of a blocked airway.  2. Central sleep apnea Pauses in breathing during sleep because the area of the brain that controls your breathing does not send the correct signals to the muscles that control breathing.  3. Mixed sleep apnea A combination of both obstructive and central sleep apnea.  RISK FACTORS  The following risk factors can increase your risk of developing sleep apnea:  · Being overweight.  · Smoking.  · Having narrow passages in your nose and throat.  · Being of older age.  · Being male.  · Alcohol use.  · Sedative and tranquilizer use.  · Ethnicity. Among individuals younger than 35 years, African Americans are at increased risk of sleep apnea.  SYMPTOMS   · Difficulty staying asleep.  · Daytime sleepiness and fatigue.  · Loss of energy.  · Irritability.  · Loud, heavy snoring.  · Morning headaches.  · Trouble concentrating.  · Forgetfulness.  · Decreased interest in sex.  DIAGNOSIS   In order to diagnose sleep apnea, your caregiver will perform a physical examination. Your caregiver may suggest that you take a home sleep test. Your caregiver may also recommend that you spend the night in a sleep lab. In the sleep lab, several monitors record  information about your heart, lungs, and brain while you sleep. Your leg and arm movements and blood oxygen level are also recorded.  TREATMENT  The following actions may help to resolve mild sleep apnea:  · Sleeping on your side.    · Using a decongestant if you have nasal congestion.    · Avoiding the use of depressants, including alcohol, sedatives, and narcotics.    · Losing weight and modifying your diet if you are overweight.  There also are devices and treatments to help open your airway:  · Oral appliances. These are custom-made mouthpieces that shift your lower jaw forward and slightly open your bite. This opens your airway.  · Devices that create positive airway pressure. This positive pressure "splints" your airway open to help you breathe better during sleep. The following devices create positive airway pressure:  · Continuous positive airway pressure (CPAP) device. The CPAP device creates a continuous level of air pressure with an air pump. The air is delivered to your airway through a mask while you sleep. This continuous pressure keeps your airway open.  · Nasal expiratory positive airway pressure (EPAP) device. The EPAP device creates positive air pressure as you exhale. The device consists of single-use valves, which are inserted into each nostril and held in place by adhesive. The valves create very little resistance when you inhale but create much more resistance when you exhale. That increased resistance creates the positive airway pressure. This positive pressure while you exhale keeps your airway open, making it easier   continuous air pressure through a mask. However, with the BPAP machine, the pressure is set at two different levels. The pressure when you  exhale is lower than the pressure when you inhale.  Surgery. Typically, surgery is only done if you cannot comply with less invasive treatments or if the less invasive treatments do not improve your condition. Surgery involves removing excess tissue in your airway to create a wider passage way. Document Released: 11/24/2002 Document Revised: 03/31/2013 Document Reviewed: 04/11/2012 [facility] Patient Information 2014 [place]. Fatigue Fatigue is a feeling of tiredness, lack of energy, lack of motivation, or feeling tired all the time. Having enough rest, good nutrition, and reducing stress will normally reduce fatigue. Consult your caregiver if it persists. The nature of your fatigue will help your caregiver to find out its cause. The treatment is based on the cause.  CAUSES  There are many causes for fatigue. Most of the time, fatigue can be traced to one or more of your habits or routines. Most causes fit into one or more of three general areas. They are: Lifestyle problems  Sleep disturbances.  Overwork.  Physical exertion.  Unhealthy habits.  Poor eating habits or eating disorders.  Alcohol and/or drug use .  Lack of proper nutrition (malnutrition). Psychological problems  Stress and/or anxiety problems.  Depression.  Grief.  Boredom. Medical Problems or Conditions  Anemia.  Pregnancy.  Thyroid gland problems.  Recovery from major surgery.  Continuous pain.  Emphysema or asthma that is not well controlled  Allergic conditions.  Diabetes.  Infections (such as mononucleosis).  Obesity.  Sleep disorders, such as sleep apnea.  Heart failure or other heart-related problems.  Cancer.  Kidney disease.  Liver disease.  Effects of certain medicines such as antihistamines, cough and cold remedies, prescription pain medicines, heart and blood pressure medicines, drugs used for treatment of cancer, and some antidepressants. SYMPTOMS  The symptoms of  fatigue include:   Lack of energy.  Lack of drive (motivation).  Drowsiness.  Feeling of indifference to the surroundings. DIAGNOSIS  The details of how you feel help guide your caregiver in finding out what is causing the fatigue. You will be asked about your present and past health condition. It is important to review all medicines that you take, including prescription and non-prescription items. A thorough exam will be done. You will be questioned about your feelings, habits, and normal lifestyle. Your caregiver may suggest blood tests, urine tests, or other tests to look for common medical causes of fatigue.  TREATMENT  Fatigue is treated by correcting the underlying cause. For example, if you have continuous pain or depression, treating these causes will improve how you feel. Similarly, adjusting the dose of certain medicines will help in reducing fatigue.  HOME CARE INSTRUCTIONS   Try to get the required amount of good sleep every night.  Eat a healthy and nutritious diet, and drink enough water throughout the day.  Practice ways of relaxing (including yoga or meditation).  Exercise regularly.  Make plans to change situations that cause stress. Act on those plans so that stresses decrease over time. Keep your work and personal routine reasonable.  Avoid street drugs and minimize use of alcohol.  Start taking a daily multivitamin after consulting your caregiver. SEEK MEDICAL CARE IF:   You have persistent tiredness, which cannot be accounted for.  You have fever.  You have unintentional weight loss.  You have headaches.  You have disturbed sleep throughout the night.  You are feeling  sad.  You have constipation.  You have dry skin.  You have gained weight.  You are taking any new or different medicines that you suspect are causing fatigue.  You are unable to sleep at night.  You develop any unusual swelling of your legs or other parts of your body. SEEK  IMMEDIATE MEDICAL CARE IF:   You are feeling confused.  Your vision is blurred.  You feel faint or pass out.  You develop severe headache.  You develop severe abdominal, pelvic, or back pain.  You develop chest pain, shortness of breath, or an irregular or fast heartbeat.  You are unable to pass a normal amount of urine.  You develop abnormal bleeding such as bleeding from the rectum or you vomit blood.  You have thoughts about harming yourself or committing suicide.  You are worried that you might harm someone else. MAKE SURE YOU:   Understand these instructions.  Will watch your condition.  Will get help right away if you are not doing well or get worse. Document Released: 10/01/2007 Document Revised: 02/26/2012 Document Reviewed: 10/01/2007 [facility] Patient Information 2014 [place].

## 2014-05-20 ENCOUNTER — Ambulatory Visit: Payer: BC Managed Care – PPO | Admitting: Neurology

## 2014-05-20 DIAGNOSIS — G4733 Obstructive sleep apnea (adult) (pediatric): Secondary | ICD-10-CM

## 2014-05-28 ENCOUNTER — Telehealth: Payer: Self-pay | Admitting: Neurology

## 2014-05-28 NOTE — Telephone Encounter (Signed)
I called and spoke with the patient about his recent sleep study results. Patient stated he will callback this afternoon for his sleep results.

## 2014-05-29 ENCOUNTER — Other Ambulatory Visit: Payer: Self-pay | Admitting: *Deleted

## 2014-05-29 DIAGNOSIS — G4710 Hypersomnia, unspecified: Secondary | ICD-10-CM

## 2014-05-29 DIAGNOSIS — R0683 Snoring: Secondary | ICD-10-CM

## 2014-05-29 NOTE — Telephone Encounter (Signed)
I called and spoke with the patient about his recent sleep study results. I informed the patient that the study revealed very mild obstructive sleep and that Dr. Brett Fairy would like to discuss treatment options during a follow up appointment. Patient has been scheduled for June 01, 2014 at 11:30 am with an arrival time of 11:15 am. I will fax a copy of the report to Dr. Merilynn Finland office and will give the patient his copy on the morning of his appointment.

## 2014-06-01 ENCOUNTER — Encounter: Payer: Self-pay | Admitting: Neurology

## 2014-06-01 ENCOUNTER — Ambulatory Visit (INDEPENDENT_AMBULATORY_CARE_PROVIDER_SITE_OTHER): Payer: Medicare Other | Admitting: Neurology

## 2014-06-01 VITALS — BP 123/77 | HR 60 | Resp 17 | Ht 73.25 in | Wt 196.0 lb

## 2014-06-01 DIAGNOSIS — M2619 Other specified anomalies of jaw-cranial base relationship: Secondary | ICD-10-CM

## 2014-06-01 DIAGNOSIS — M2610 Unspecified anomaly of jaw-cranial base relationship: Secondary | ICD-10-CM

## 2014-06-01 DIAGNOSIS — G4733 Obstructive sleep apnea (adult) (pediatric): Secondary | ICD-10-CM

## 2014-06-01 NOTE — Progress Notes (Signed)
[facility]  Provider:  Larey Lane, M D  Referring Provider: Marjorie Smolder, MD Primary Care Physician:  Jonathan Smolder, MD  Chief Complaint  Patient presents with  . Follow-up    Room   . treatment options   Dear Dr. Inda Lane ,   Thank you for allowing me to participate in your patient's sleep medical care.   HPI:  Jonathan Lane is a 73 y.o., caucasian, married, right handed male , who is seen here upon referral from Dr. Inda Lane for a sleep evaluation, Jonathan Lane has continued to lose weight and is actually visibly slender, he has less snoring which is confirmed by his spouse. The patient underwent a polysomnography on 05-20-14 which documented an AHI of 7.2 and an RDI of 7.2 as well. The sleep efficiency was 77% the patient AHI was greatest during REM sleep and partially depending on sleep position. During REM sleep the AHI was 15.1 and in supine sleep 8.0. The last option saturation was 86% is about 45 minutes of desaturations. Heart rate was borderline low between 49 and 51 beats per minute. The patient's main problem seems to be fatigue as well as a slightly elevated daytime sleepiness with an Epworth of 12 points. Be discussed today treatment options for this mild sleep apnea. Since REM sleep seems to accentuate the sleep apnea positive airway pressure therapy is a viable option to treat him it would be a very small pressure Jonathan Lane for about 14 days. Alternative therapies oppositional which the patient already is aware of.  The position of sleep however did not accentuated apneas very  significantly. Since most of the apneas were actually REM dependent medication to suppress  REM sleep or prolonged the REM latency , which is also a viable option. The patient has  retrognathia and a dental advancement therapy could be addressed should these other options fail.       Consultation note: The patient's usual bedtime is around 12:00, falls  asleep promptly , rises in the morning between 7 and 8 AM. Appears not to be asleep for the full  interval. He states that he wakes up frequently about every 30 minutes after an initial arousal at about 4 AM. He's not sure why he wakes up so frequently; he is not in pain, he does not have shortness of breath or choking, nor nightmares. Goes to  the bathroom between one or 2 times at night.  He estimates his total sleep time to be close to 5.5 hours. He has been witnessed to snore and his wife reports him to have apnea. His wife reports that he would have crescendo breathing, that after a period of shallow breathing or stopping to breathe he would seemingly gasp for air and  jerk. He has daytime excessive sleepiness and severe fatigue.  The patient reports no refreshing sleep and non restorative sleep for well over 2 decades. He was employed at [employer]. He retired in 2000. He was working irregular hours , 60 hrs. /week . No caffeine, his PVCs are resolved, rare ETOH, no tobacco use.  He has a known history of nasal septal deviation, had a tonsillectomy in childhood that caused a bleeding, and has a history of PVCs.  He is in the process of losing weight ( 30 pounds already ) , but has continued to snore, and he has nocturia, significant retrognathia. He has a mustache.  He has been evaluated for another adenoid surgery .  His father was known to snore and had apnea, he was overweight. He dies after a stroke.   Review of Systems: Out of a complete 14 system review, the patient complains of only the following symptoms, and all other reviewed systems are negative. Epworth sleepiness score of 12 points, the fatigue severity score at 48 points and the geriatric depression scale at 2 points.     History   Social History  . Marital Status: Married    Spouse Name: Jonathan Lane    Number of Children: 1  . Years of Education: Masters   Occupational History  .     Social History  Main Topics  . Smoking status: Former Smoker    Types: Cigarettes    Quit date: 12/18/1965  . Smokeless tobacco: Never Used  . Alcohol Use: Yes     Comment: 5-7 drinks per week  . Drug Use: No  . Sexual Activity: Not on file   Other Topics Concern  . Not on file   Social History Narrative   Patient is married Jonathan Lane).   Patient is retired.   Patient has one adult child.   Patient does not drink any caffeine.   Patient is right-handed.   Patient has a Scientist, water quality.             Family History  Problem Relation Age of Onset  . Stroke Father   . Stroke Mother   . Stroke Sister   . Breast cancer Sister   . High blood pressure Sister   . Diabetes Sister   . Colon cancer      Uncle    Past Medical History  Diagnosis Date  . Hyperlipidemia   . Hypertension   . Bradycardia   . Heart palpitations   . Cardiac murmur   . Atrial fibrillation   . Dizziness   . Bradycardia   . Snoring     Past Surgical History  Procedure Laterality Date  . Tonsillectomy      as a child  . Appendectomy  1965    Current Outpatient Prescriptions  Medication Sig Dispense Refill  . aspirin 81 MG tablet Take 81 mg by mouth daily.       . bimatoprost (LUMIGAN) 0.01 % SOLN 1 drop at bedtime.      . Coenzyme Q10 (CO Q 10 PO) Take 1 Can by mouth daily.      . COSOPT PF 22.3-6.8 MG/ML SOLN Apply 1 drop to eye 2 (two) times daily. Each eye       . Cranberry 400 MG CAPS Take 1 capsule by mouth daily.      Marland Kitchen diltiazem (CARDIZEM CD) 240 MG 24 hr capsule Take 1 capsule (240 mg total) by mouth daily.  30 capsule  11  . docusate sodium (COLACE) 100 MG capsule Take 100 mg by mouth as needed.      . fish oil-omega-3 fatty acids 1000 MG capsule Take 1 g by mouth daily.       Marland Kitchen losartan-hydrochlorothiazide (HYZAAR) 100-12.5 MG per tablet Take 1 tablet by mouth daily.  30 tablet  11  . Misc Natural Products (PROSTATE HEALTH) CAPS Take 1 capsule by mouth daily.      . multivitamin (THERAGRAN) per  tablet Take 1 tablet by mouth daily.      . Pomegranate 250 MG CAPS Take 1 capsule by mouth daily.      . valACYclovir (VALTREX) 1000 MG tablet Take 1,000 mg by mouth as needed.  No current facility-administered medications for this visit.    Allergies as of 06/01/2014 - Review Complete 06/01/2014  Allergen Reaction Noted  . Combigan [brimonidine tartrate-timolol]  01/01/2012    Vitals: BP 123/77  Pulse 60  Resp 17  Ht 6' 1.25" (1.861 m)  Wt 196 lb (88.905 kg)  BMI 25.67 kg/m2 Last Weight:  Wt Readings from Last 1 Encounters:  06/01/14 196 lb (88.905 kg)   Last Height:   Ht Readings from Last 1 Encounters:  06/01/14 6' 1.25" (1.861 m)    Physical exam:  General: The patient is awake, alert and appears not in acute distress. The patient is well groomed. Head: Normocephalic, atraumatic.  Neck is supple. Mallampati 3 , neck circumference: 16, retrognathia, reduced air space.   TMJ click on both sides.   Cardiovascular:  Regular rate and rhythm , without  murmurs or carotid bruit, and without distended neck veins. Respiratory: Lungs are clear to auscultation. Skin:  Without evidence of edema, or rash Trunk: BMI , normal posture.  Neurologic exam : The patient is awake and alert, oriented to place and time.   Memory subjective described as intact. There is a normal attention span & concentration ability. Speech is fluent without dysarthria, dysphonia or aphasia. Mood and affect are appropriate.  Cranial nerves: Pupils are equal and briskly reactive to light. Funduscopic exam without  evidence of pallor or edema.  Extraocular movements  in vertical and horizontal planes intact and without nystagmus. Visual fields by finger perimetry are intact. Hearing to finger rub intact.  Facial sensation intact to fine touch. Facial motor strength is symmetric and tongue and uvula move midline.  Motor exam:  Normal tone , muscle bulk and symmetric  in all extremities.  Sensory:   Fine touch, pinprick and vibration were tested in all extremities.  Proprioception is tested in the upper extremities only. This was normal.  Coordination: Rapid alternating movements in the fingers/hands is tested and normal. Finger-to-nose maneuver tested and normal without evidence of ataxia, dysmetria or tremor.  Gait and station: Patient walks without assistive device .  Deep tendon reflexes: in the  upper and lower extremities are symmetric and intact. Babinski maneuver response is  downgoing.   Assessment:  After physical and neurologic examination, review of laboratory studies, imaging, neurophysiology testing and pre-existing records, assessment is  1) witnessed apneas, snoring and gasping for air - retrognathia, TMJ- but he has reduced his BMI significantly.     Discussed auto pap treatment as well as low dose Amitriptyline .  Plan:  Treatment plan and additional workup : 1) send order to DME for auto-titration form 4 through 10 cm water , mask of choice to be fitted with [place] .  Elavil 10 mg at night not a possibility due to glaucoma.  If sleep PAP doesn't work will use dental advancement therapy

## 2014-06-04 ENCOUNTER — Other Ambulatory Visit: Payer: Medicare Other | Admitting: *Deleted

## 2014-06-05 ENCOUNTER — Other Ambulatory Visit (INDEPENDENT_AMBULATORY_CARE_PROVIDER_SITE_OTHER): Payer: Self-pay | Admitting: *Deleted

## 2014-06-05 DIAGNOSIS — Z0289 Encounter for other administrative examinations: Secondary | ICD-10-CM

## 2014-06-05 NOTE — Progress Notes (Signed)
Pt arrives at sleep lab for CPAP mask fitting and desensitization due to: patient needs mask fitting as he is being sent directly to auto-cpap settings of 4-10 cm H2O.  Pt tried 4 cm, 5 cm, 6 cm w/ epr 1 today and seemed to do well with all settings.  He was educated about EPR and auto cpap capabilities and how it will benefit him during REM sleep.  Patient has mildly deviated septum and we discussed saline nasal spray, Flonase, humidity, breathe right strips as options to ensure he does not experience issues with it.  He does have an ENT physician who could evaluate him if needed if it presents a problem.  He is committed to therapy and anxious to get started.  He was able to desensitize well in the sleep lab and tried several sleep positions and used CPAP for a total of probably 30 minutes.    CPAP Masks tried:  ResMed P10 Medium, Respironics Nuance Pro size medium, [place] Large nasal mask, [place] size standard nasal mask  CPAP Masks preferred:  ResMed P10 medium  Desensitization needs:   Pt did experience some tenderness even though he preferred the pillow, he may need to have a nasal mask available to switch back and forth with, he is aware he can contact our office to borrow one from [place].  He is also made aware of the initial 30 day mask exchange program offered by DME and mask manufacture.  He asked about Amitriptyline prescription, I couldn't see that it was place but only discussed during visit.  I explained Dr. Brett Fairy may want to see how he does with CPAP only and reevaluate at 30 day visit before adding medication.  I will contact [facility] and let them know he is ready and anxious to get started.  [facility] brochure given to patient.

## 2014-07-15 ENCOUNTER — Encounter: Payer: Self-pay | Admitting: Neurology

## 2014-08-20 ENCOUNTER — Encounter: Payer: Self-pay | Admitting: Gastroenterology

## 2014-09-25 ENCOUNTER — Encounter: Payer: Self-pay | Admitting: Gastroenterology

## 2014-10-09 ENCOUNTER — Ambulatory Visit (INDEPENDENT_AMBULATORY_CARE_PROVIDER_SITE_OTHER): Payer: Medicare Other | Admitting: Neurology

## 2014-10-09 ENCOUNTER — Encounter: Payer: Self-pay | Admitting: Neurology

## 2014-10-09 VITALS — BP 143/81 | HR 55 | Temp 97.2°F | Resp 14 | Ht 74.25 in | Wt 198.0 lb

## 2014-10-09 DIAGNOSIS — Z9989 Dependence on other enabling machines and devices: Secondary | ICD-10-CM | POA: Insufficient documentation

## 2014-10-09 DIAGNOSIS — Z9911 Dependence on respirator [ventilator] status: Secondary | ICD-10-CM

## 2014-10-09 MED ORDER — FEXOFENADINE HCL 30 MG PO TBDP: 30.0000 mg | ORAL_TABLET | Freq: Every day | ORAL | Status: DC

## 2014-10-09 NOTE — Progress Notes (Signed)
[facility]  Provider:  Larey Seat, M D  Referring Provider: Marjorie Smolder, MD Primary Care Physician:  Marjorie Smolder, MD  Chief Complaint  Patient presents with  . RV sleep    Rm 11, alone   Dear Dr. Inda Merlin ,   Thank you for allowing me to participate in your patient's sleep medical care:   HPI:  Jonathan Lane is a 73 y.o., caucasian, married, right handed male , who is seen here upon referral from Dr. Inda Merlin for a sleep evaluation, Tamashiro has continued to lose weight and is actually visibly slender, he has less snoring which is confirmed by his spouse. The patient underwent a polysomnography on 05-20-14 which documented an AHI of 7.2 and an RDI of 7.2 as well. The sleep efficiency was 77% the patient AHI was greatest during REM sleep and partially depending on sleep position. During REM sleep the AHI was 15.1 and in supine sleep 8.0. The last option saturation was 86% is about 45 minutes of desaturations. Heart rate was borderline low between 49 and 51 beats per minute. The patient's main problem seems to be fatigue as well as a slightly elevated daytime sleepiness with an Epworth of 12 points. Be discussed today treatment options for this mild sleep apnea. Since REM sleep seems to accentuate the sleep apnea positive airway pressure therapy is a viable option to treat him it would be a very small pressure [name] for about 14 days. Alternative therapies oppositional which the patient already is aware of.  The position of sleep however did not accentuated apneas very  significantly. Since most of the apneas were actually REM dependent medication to suppress  REM sleep or prolonged the REM latency , which is also a viable option. The patient has  retrognathia and a dental advancement therapy could be addressed should these other options fail. The patient's usual bedtime is around 12:00, falls asleep promptly , rises in the morning between 7  and 8 AM. Appears not to be asleep for the full  interval. He states that he wakes up frequently about every 30 minutes after an initial arousal at about 4 AM. He's not sure why he wakes up so frequently; he is not in pain, he does not have shortness of breath or choking, nor nightmares. Goes to  the bathroom between one or 2 times at night.  He estimates his total sleep time to be close to 5.5 hours. He has been witnessed to snore and his wife reports him to have apnea. His wife reports that he would have crescendo breathing, that after a period of shallow breathing or stopping to breathe he would seemingly gasp for air and  jerk. He has daytime excessive sleepiness and severe fatigue.  The patient reports no refreshing sleep and non restorative sleep for well over 2 decades. He was employed at [employer]. He retired in 2000.  He was working irregular hours , 60 hrs. /week . No caffeine, his PVCs are resolved, rare ETOH, no tobacco use. He has a known history of nasal septal deviation, had a tonsillectomy in childhood that caused a bleeding, and has a history of PVCs.  He is in the process of losing weight ( 30 pounds already ) , but has continued to snore, and he has nocturia, significant retrognathia. He has a mustache.  He has been evaluated for another adenoid surgery .    His father was known to snore and  had apnea, he was overweight. He died after a stroke. His daughter is bipolar and decompensated after 9-11, lost her job in [place] after a paranoid break down.   Interval history : 10-09-14  Mr. Gutterman sleeps better, is compliant with CPAP but uses almost daily Afrin.  73% compliance after recent adenoid surgery.  AHI 2.1 ,  Average use of CPAP daily is  4 hours 49 minutes.  High air leak. He is happy with his restorative sleep. Weight loss sustained.  198 pounds    Review of Systems: Out of a complete 14 system review, the patient complains of only the  following symptoms, and all other reviewed systems are negative.  FSS 17 from 48 points  and Epworth  9 points from 12 . GDS score  Runny nose.  Non smoker, but 5-6 drinks a week.          History   Social History  . Marital Status: Married    Spouse Name: Jonathan Lane    Number of Children: 1  . Years of Education: Masters   Occupational History  .     Social History Main Topics  . Smoking status: Former Smoker    Types: Cigarettes    Quit date: 12/18/1965  . Smokeless tobacco: Never Used  . Alcohol Use: Yes     Comment: 5-7 drinks per week  . Drug Use: No  . Sexual Activity: Not on file   Other Topics Concern  . Not on file   Social History Narrative   Patient is married Jonathan Lane).   Patient is retired.   Patient has one adult child.   Patient does not drink any caffeine.   Patient is right-handed.   Patient has a Scientist, water quality.             Family History  Problem Relation Age of Onset  . Stroke Father   . Stroke Mother   . Stroke Sister   . Breast cancer Sister   . High blood pressure Sister   . Diabetes Sister   . Colon cancer      Uncle    Past Medical History  Diagnosis Date  . Hyperlipidemia   . Hypertension   . Bradycardia   . Heart palpitations   . Cardiac murmur   . Atrial fibrillation   . Dizziness   . Bradycardia   . Snoring     Past Surgical History  Procedure Laterality Date  . Tonsillectomy      as a child  . Appendectomy  1965    Current Outpatient Prescriptions  Medication Sig Dispense Refill  . aspirin 81 MG tablet Take 81 mg by mouth daily.       . bimatoprost (LUMIGAN) 0.01 % SOLN 1 drop at bedtime.      . Coenzyme Q10 (CO Q 10 PO) Take 1 Can by mouth daily.      . COSOPT PF 22.3-6.8 MG/ML SOLN Apply 1 drop to eye 2 (two) times daily. Each eye       . Cranberry 400 MG CAPS Take 1 capsule by mouth daily.      Marland Kitchen diltiazem (CARDIZEM CD) 240 MG 24 hr capsule Take 1 capsule (240 mg total) by mouth daily.  30 capsule  11   . docusate sodium (COLACE) 100 MG capsule Take 100 mg by mouth as needed.      . fish oil-omega-3 fatty acids 1000 MG capsule Take 1 g by mouth daily.       Marland Kitchen  losartan-hydrochlorothiazide (HYZAAR) 100-12.5 MG per tablet Take 1 tablet by mouth daily.  30 tablet  11  . Misc Natural Products (PROSTATE HEALTH) CAPS Take 1 capsule by mouth daily.      . multivitamin (THERAGRAN) per tablet Take 1 tablet by mouth daily.      Marland Kitchen oxymetazoline (AFRIN) 0.05 % nasal spray Place 1 spray into both nostrils daily as needed for congestion.      . Pomegranate 250 MG CAPS Take 1 capsule by mouth daily.      . valACYclovir (VALTREX) 1000 MG tablet Take 1,000 mg by mouth as needed.       No current facility-administered medications for this visit.    Allergies as of 10/09/2014 - Review Complete 10/09/2014  Allergen Reaction Noted  . Combigan [brimonidine tartrate-timolol]  01/01/2012    Vitals: BP 143/81  Pulse 55  Temp(Src) 97.2 F (36.2 C) (Oral)  Resp 14  Ht 6' 2.25" (1.886 m)  Wt 198 lb (89.812 kg)  BMI 25.25 kg/m2 Last Weight:  Wt Readings from Last 1 Encounters:  10/09/14 198 lb (89.812 kg)   Last Height:   Ht Readings from Last 1 Encounters:  10/09/14 6' 2.25" (1.886 m)    Physical exam:  General: The patient is awake, alert and appears not in acute distress. The patient is well groomed. Head: Normocephalic, atraumatic.  Neck is supple. Mallampati 3 , neck circumference: 16, retrognathia, reduced air space.   TMJ click on both sides.   Cardiovascular:  Regular rate and rhythm , without  murmurs or carotid bruit, and without distended neck veins. Respiratory: Lungs are clear to auscultation. Skin:  Without evidence of edema, or rash Trunk: BMI , normal posture.  Neurologic exam : The patient is awake and alert, oriented to place and time.   Memory subjective described as intact. There is a normal attention span & concentration ability. Speech is fluent without dysarthria,  dysphonia or aphasia. Mood and affect are appropriate.  Cranial nerves: Pupils are equal and briskly reactive to light. Funduscopic exam without  evidence of pallor or edema.  Extraocular movements  in vertical and horizontal planes intact and without nystagmus. Visual fields by finger perimetry are intact. Hearing to finger rub intact.  Facial sensation intact to fine touch. Facial motor strength is symmetric and tongue and uvula move midline.  Motor exam:  Normal tone , muscle bulk and symmetric  in all extremities.  Sensory:  Fine touch, pinprick and vibration were tested in all extremities.  Proprioception is tested in the upper extremities only. This was normal.  Coordination: Rapid alternating movements in the fingers/hands is tested and normal. Finger-to-nose maneuver tested and normal without evidence of ataxia, dysmetria or tremor.  Gait and station: Patient walks without assistive device .  Deep tendon reflexes: in the  upper and lower extremities are symmetric and intact. Babinski maneuver response is  downgoing.   Assessment:  After physical and neurologic examination, review of laboratory studies, imaging, neurophysiology testing and pre-existing records, assessment is OSA. Plan:  Treatment plan and additional workup : 1)  Not interested in sinus  surgery , has a blocked right nasion, on Afrin. I will write for fexofenadine   Daily use and urged him to hydrate well , try Mucinex.   Saline nose rinse.

## 2014-11-18 ENCOUNTER — Ambulatory Visit (AMBULATORY_SURGERY_CENTER): Payer: Self-pay | Admitting: *Deleted

## 2014-11-18 VITALS — Ht 74.0 in | Wt 201.6 lb

## 2014-11-18 DIAGNOSIS — Z86010 Personal history of colonic polyps: Secondary | ICD-10-CM

## 2014-11-18 MED ORDER — NA SULFATE-K SULFATE-MG SULF 17.5-3.13-1.6 GM/177ML PO SOLN: 1.0000 | Freq: Once | ORAL | Status: DC

## 2014-11-18 NOTE — Progress Notes (Signed)
No egg or soy allergy. ewm No home 02 use. ewm No blood thinners. ewm No issues with past sedation. ewm Pt declined emmi video. ewm Pt states he takes florastor and prn colace. stools are soft and regular now per pt. ewm

## 2014-12-02 ENCOUNTER — Ambulatory Visit (AMBULATORY_SURGERY_CENTER): Payer: Medicare Other | Admitting: Gastroenterology

## 2014-12-02 ENCOUNTER — Encounter: Payer: Self-pay | Admitting: Gastroenterology

## 2014-12-02 VITALS — BP 124/59 | HR 52 | Temp 97.7°F | Resp 13 | Ht 74.0 in | Wt 201.0 lb

## 2014-12-02 DIAGNOSIS — Z86010 Personal history of colonic polyps: Secondary | ICD-10-CM

## 2014-12-02 MED ORDER — SODIUM CHLORIDE 0.9 % IV SOLN: 500.0000 mL | INTRAVENOUS | Status: DC

## 2014-12-02 NOTE — Progress Notes (Signed)
A/ox3 pleased with MAC, report to Penny RN 

## 2014-12-02 NOTE — Op Note (Signed)
[place]  [address]. [place], [address]   COLONOSCOPY PROCEDURE REPORT  PATIENT: Jonathan Lane, Jonathan Lane  MR#: [id number] BIRTHDATE: 1941/07/19 , 73  yrs. old GENDER: male ENDOSCOPIST: Inda Castle, MD REFERRED JK:DTOIZ Inda Merlin, M.D. PROCEDURE DATE:  12/02/2014 PROCEDURE:   Colonoscopy, diagnostic First Screening Colonoscopy - Avg.  risk and is 50 yrs.  old or older - No.  Prior Negative Screening - Now for repeat screening. N/A  History of Adenoma - Now for follow-up colonoscopy & has been > or = to 3 yrs.  Yes hx of adenoma.  Has been 3 or more years since last colonoscopy.  Polyps Removed Today? No.  Recommend repeat exam, <10 yrs? No. ASA CLASS:   Class II INDICATIONS:high risk personal history of colonic polyps 20023. 2008 colonoscopy negative for polyps MEDICATIONS: Monitored anesthesia care and Propofol 160 mg IV  DESCRIPTION OF PROCEDURE:   After the risks benefits and alternatives of the procedure were thoroughly explained, informed consent was obtained.  The digital rectal exam revealed no abnormalities of the rectum.   The LB [id number]  endoscope was introduced through the anus and advanced to the terminal ileum which was intubated for a short distance. No adverse events experienced.   The quality of the prep was excellent using Suprep The instrument was then slowly withdrawn as the colon was fully examined.      COLON FINDINGS: There was mild diverticulosis noted in the sigmoid colon.   The examination was otherwise normal.  Retroflexed views revealed no abnormalities. The time to cecum=2 minutes 23 seconds. Withdrawal time=7 minutes 26 seconds.  The scope was withdrawn and the procedure completed. COMPLICATIONS: There were no immediate complications.  ENDOSCOPIC IMPRESSION: 1.   Mild diverticulosis was noted in the sigmoid colon 2.   The examination was otherwise normal  RECOMMENDATIONS: Given your age, you will not need another  colonoscopy for colon cancer screening or polyp surveillance.  These types of tests usually stop around the age 32.  eSigned:  Inda Castle, MD 12/02/2014 10:16 AM   cc:

## 2014-12-02 NOTE — Patient Instructions (Signed)
Discharge instructions given. Handouts on diverticulosis and a high fiber diet. Resume previous medications. YOU HAD AN ENDOSCOPIC PROCEDURE TODAY AT THE [facility]: Refer to the procedure report that was given to you for any specific questions about what was found during the examination.  If the procedure report does not answer your questions, please call your gastroenterologist to clarify.  If you requested that your care partner not be given the details of your procedure findings, then the procedure report has been included in a sealed envelope for you to review at your convenience later.  YOU SHOULD EXPECT: Some feelings of bloating in the abdomen. Passage of more gas than usual.  Walking can help get rid of the air that was put into your GI tract during the procedure and reduce the bloating. If you had a lower endoscopy (such as a colonoscopy or flexible sigmoidoscopy) you may notice spotting of blood in your stool or on the toilet paper. If you underwent a bowel prep for your procedure, then you may not have a normal bowel movement for a few days.  DIET: Your first meal following the procedure should be a light meal and then it is ok to progress to your normal diet.  A half-sandwich or bowl of soup is an example of a good first meal.  Heavy or fried foods are harder to digest and may make you feel nauseous or bloated.  Likewise meals heavy in dairy and vegetables can cause extra gas to form and this can also increase the bloating.  Drink plenty of fluids but you should avoid alcoholic beverages for 24 hours.  ACTIVITY: Your care partner should take you home directly after the procedure.  You should plan to take it easy, moving slowly for the rest of the day.  You can resume normal activity the day after the procedure however you should NOT DRIVE or use heavy machinery for 24 hours (because of the sedation medicines used during the test).    SYMPTOMS TO REPORT IMMEDIATELY: A  gastroenterologist can be reached at any hour.  During normal business hours, 8:30 AM to 5:00 PM Monday through Friday, call [phone].  After hours and on weekends, please call the GI answering service at [phone] who will take a message and have the physician on call contact you.   Following lower endoscopy (colonoscopy or flexible sigmoidoscopy):  Excessive amounts of blood in the stool  Significant tenderness or worsening of abdominal pains  Swelling of the abdomen that is new, acute  Fever of 100F or higher  FOLLOW UP: If any biopsies were taken you will be contacted by phone or by letter within the next 1-3 weeks.  Call your gastroenterologist if you have not heard about the biopsies in 3 weeks.  Our staff will call the home number listed on your records the next business day following your procedure to check on you and address any questions or concerns that you may have at that time regarding the information given to you following your procedure. This is a courtesy call and so if there is no answer at the home number and we have not heard from you through the emergency physician on call, we will assume that you have returned to your regular daily activities without incident.  SIGNATURES/CONFIDENTIALITY: You and/or your care partner have signed paperwork which will be entered into your electronic medical record.  These signatures attest to the fact that that the information above on your After Visit Summary   has been reviewed and is understood.  Full responsibility of the confidentiality of this discharge information lies with you and/or your care-partner. 

## 2014-12-03 ENCOUNTER — Telehealth: Payer: Self-pay | Admitting: *Deleted

## 2014-12-03 NOTE — Telephone Encounter (Signed)
  Follow up Call-  Call back number 12/02/2014  Post procedure Call Back phone  # [phone]  Permission to leave phone message Yes     Left message to call us back if experiencing problems or has any questions

## 2014-12-23 ENCOUNTER — Other Ambulatory Visit: Payer: Self-pay | Admitting: Cardiovascular Disease

## 2015-01-13 ENCOUNTER — Encounter: Payer: Self-pay | Admitting: Cardiovascular Disease

## 2015-01-13 ENCOUNTER — Ambulatory Visit (INDEPENDENT_AMBULATORY_CARE_PROVIDER_SITE_OTHER): Payer: PPO | Admitting: Cardiovascular Disease

## 2015-01-13 VITALS — BP 130/70 | HR 54 | Ht 74.0 in | Wt 206.8 lb

## 2015-01-13 DIAGNOSIS — R0683 Snoring: Secondary | ICD-10-CM

## 2015-01-13 DIAGNOSIS — E785 Hyperlipidemia, unspecified: Secondary | ICD-10-CM

## 2015-01-13 DIAGNOSIS — I10 Essential (primary) hypertension: Secondary | ICD-10-CM

## 2015-01-13 NOTE — Assessment & Plan Note (Signed)
Cholesterol is at goal.  Continue current dose of statin and diet Rx.  No myalgias or side effects.  F/U  LFT's in 6 months. Lab Results  Component Value Date   LDLCALC 122* 07/24/2007

## 2015-01-13 NOTE — Assessment & Plan Note (Signed)
Wearing CPAP since June and sleeping better Discussed how this will improve BP and make recurrence of PAF less likely

## 2015-01-13 NOTE — Assessment & Plan Note (Signed)
Well controlled.  Continue current medications and low sodium Dash type diet.   Told him to take BP meds in morning and afternoon

## 2015-01-13 NOTE — Patient Instructions (Signed)
Your physician wants you to follow-up in: YEAR WITH DR NISHAN  You will receive a reminder letter in the mail two months in advance. If you don't receive a letter, please call our office to schedule the follow-up appointment.  Your physician recommends that you continue on your current medications as directed. Please refer to the Current Medication list given to you today. 

## 2015-01-13 NOTE — Progress Notes (Signed)
Patient ID: Jonathan Lane, male   DOB: 1941-04-04, 74 y.o.   MRN: [id number] [facility] is seen today for F/U of dizzyness and HTN. He seems overly concerned about his BP. He occasionally gets hight readings at home. In our office he is always non-postural and in the 280 sytolic range. I did speak with him last time about the effects of ETOH making BP more labile. He had his prostate biopsy with Dr Era Bumpers and fortuately his does not have CA. I believe this stress may have had something to do with it. His visual problems and dizzyness are resolved He denies SSCP, diaphoresis, dyspnea ore edema. He has been compliant with his meds Has some soreness in the medial aspect of his right elbow "golfers elbow" even though he only plays tennis. Has decreased ASA to 81 mg. Suggested Naproxen for NSAI Weight is up and not exercising.   Wife [employer] is a patient of mine with chronic back problems  Much better on high dose tramadol 8 pills/day Recent travel to [place] and flu   Has lost weight by eating better     ROS: Denies fever, malais, weight loss, blurry vision, decreased visual acuity, cough, sputum, SOB, hemoptysis, pleuritic pain, palpitaitons, heartburn, abdominal pain, melena, lower extremity edema, claudication, or rash.  All other systems reviewed and negative  General: Affect appropriate Healthy:  appears stated age 74: normal Neck supple with no adenopathy JVP normal no bruits no thyromegaly Lungs clear with no wheezing and good diaphragmatic motion Heart:  S1/S2 no murmur, no rub, gallop or click PMI normal Abdomen: benighn, BS positve, no tenderness, no AAA no bruit.  No HSM or HJR Distal pulses intact with no bruits No edema Neuro non-focal Skin warm and dry No muscular weakness   Current Outpatient Prescriptions  Medication Sig Dispense Refill  . aspirin 81 MG tablet Take 81 mg by mouth daily.     . bimatoprost (LUMIGAN) 0.01 % SOLN 1 drop at bedtime.    . Coenzyme Q10  (CO Q 10 PO) Take 1 Can by mouth daily.    . COSOPT PF 22.3-6.8 MG/ML SOLN Apply 1 drop to eye 2 (two) times daily. Each eye     . Cranberry 400 MG CAPS Take 1 capsule by mouth daily.    Marland Kitchen diltiazem (CARDIZEM CD) 240 MG 24 hr capsule TAKE (1) CAPSULE DAILY. 30 capsule 0  . docusate sodium (COLACE) 100 MG capsule Take 100 mg by mouth as needed.    . fexofenadine (ALLEGRA ODT) 30 MG disintegrating tablet Take 1 tablet (30 mg total) by mouth daily. (Patient not taking: Reported on 11/18/2014) 30 tablet 0  . fish oil-omega-3 fatty acids 1000 MG capsule Take 1 g by mouth daily.     Marland Kitchen losartan-hydrochlorothiazide (HYZAAR) 100-12.5 MG per tablet TAKE 1 TABLET ONCE DAILY. 30 tablet 0  . Misc Natural Products (PROSTATE HEALTH) CAPS Take 1 capsule by mouth daily.    . multivitamin (THERAGRAN) per tablet Take 1 tablet by mouth daily.    Marland Kitchen oxymetazoline (AFRIN) 0.05 % nasal spray Place 1 spray into both nostrils daily as needed for congestion.    . Pomegranate 250 MG CAPS Take 1 capsule by mouth daily.    Marland Kitchen saccharomyces boulardii (FLORASTOR) 250 MG capsule Take 250 mg by mouth 2 (two) times daily.    . valACYclovir (VALTREX) 1000 MG tablet Take 1,000 mg by mouth as needed.     No current facility-administered medications for this visit.  Allergies  Combigan  Electrocardiogram:  12/04/13  SR rate 56  Nonspecific ST changes  01/13/15 SR rate 54  Normal   Assessment and Plan

## 2015-01-25 ENCOUNTER — Other Ambulatory Visit: Payer: Self-pay | Admitting: Cardiovascular Disease

## 2015-02-02 ENCOUNTER — Other Ambulatory Visit: Payer: Self-pay | Admitting: Cardiovascular Disease

## 2015-02-05 ENCOUNTER — Emergency Department (HOSPITAL_COMMUNITY)
Admission: EM | Admit: 2015-02-05 | Discharge: 2015-02-06 | Disposition: A | Discharge status: Home / Self Care | Payer: PPO | Attending: Emergency Medicine | Admitting: Emergency Medicine

## 2015-02-05 ENCOUNTER — Encounter (HOSPITAL_COMMUNITY): Payer: Self-pay | Admitting: Emergency Medicine

## 2015-02-05 DIAGNOSIS — R040 Epistaxis: Secondary | ICD-10-CM | POA: Insufficient documentation

## 2015-02-05 DIAGNOSIS — R011 Cardiac murmur, unspecified: Secondary | ICD-10-CM | POA: Insufficient documentation

## 2015-02-05 DIAGNOSIS — Z9981 Dependence on supplemental oxygen: Secondary | ICD-10-CM | POA: Diagnosis not present

## 2015-02-05 DIAGNOSIS — G4730 Sleep apnea, unspecified: Secondary | ICD-10-CM | POA: Insufficient documentation

## 2015-02-05 DIAGNOSIS — E785 Hyperlipidemia, unspecified: Secondary | ICD-10-CM | POA: Insufficient documentation

## 2015-02-05 DIAGNOSIS — Z7982 Long term (current) use of aspirin: Secondary | ICD-10-CM | POA: Diagnosis not present

## 2015-02-05 DIAGNOSIS — I10 Essential (primary) hypertension: Secondary | ICD-10-CM | POA: Diagnosis not present

## 2015-02-05 DIAGNOSIS — Z79899 Other long term (current) drug therapy: Secondary | ICD-10-CM | POA: Diagnosis not present

## 2015-02-05 DIAGNOSIS — I4891 Unspecified atrial fibrillation: Secondary | ICD-10-CM | POA: Insufficient documentation

## 2015-02-05 DIAGNOSIS — Z87891 Personal history of nicotine dependence: Secondary | ICD-10-CM | POA: Insufficient documentation

## 2015-02-05 DIAGNOSIS — H409 Unspecified glaucoma: Secondary | ICD-10-CM | POA: Diagnosis not present

## 2015-02-05 MED ORDER — OXYMETAZOLINE HCL 0.05 % NA SOLN
3.0000 | Freq: Once | NASAL | Status: AC
  Administered 2015-02-06: 3 via NASAL
  Filled 2015-02-05: qty 15

## 2015-02-05 NOTE — ED Notes (Signed)
Bleeding controlled.

## 2015-02-05 NOTE — ED Notes (Signed)
Pt reports he sneezed and his nose began to bleed. Bleeding currently uncontrolled pt told to hold pressure to bridge of nose. Pt reports he takes Aspirin and is on blood pressure medication.

## 2015-02-06 MED ORDER — SALINE SPRAY 0.65 % NA SOLN: 2.0000 | NASAL | Status: DC | PRN

## 2015-02-06 NOTE — Discharge Instructions (Signed)

## 2015-02-06 NOTE — ED Provider Notes (Signed)
TIME SEEN: 12:20 AM  CHIEF COMPLAINT: Epistaxis  HPI: Pt is a 74 y.o. male with history of hypertension, hyperlipidemia, atrial fibrillation only on aspirin who presents to the emergency department with a left-sided nosebleed that started tonight. He reports he sneezed and his nose began to bleed. Denies any facial trauma, objects in his nose, picking his nose. States he was holding pressure at home without relief.  ROS: See HPI Constitutional: no fever  Eyes: no drainage  ENT: no runny nose   Cardiovascular:  no chest pain  Resp: no SOB  GI: no vomiting GU: no dysuria Integumentary: no rash  Allergy: no hives  Musculoskeletal: no leg swelling  Neurological: no slurred speech ROS otherwise negative  PAST MEDICAL HISTORY/PAST SURGICAL HISTORY:  Past Medical History  Diagnosis Date  . Hyperlipidemia   . Hypertension   . Bradycardia   . Heart palpitations   . Cardiac murmur   . Atrial fibrillation   . Dizziness   . Bradycardia   . Snoring   . Sleep apnea     uses cpap  . Allergy   . Glaucoma     MEDICATIONS:  Prior to Admission medications   Medication Sig Start Date End Date Taking? Authorizing Provider  aspirin 81 MG tablet Take 81 mg by mouth daily.     Historical Provider, MD  bimatoprost (LUMIGAN) 0.01 % SOLN 1 drop at bedtime.    Historical Provider, MD  Coenzyme Q10 (CO Q 10 PO) Take 1 Can by mouth daily.    Historical Provider, MD  COSOPT PF 22.3-6.8 MG/ML SOLN Apply 1 drop to eye 2 (two) times daily. Each eye  12/28/11   Historical Provider, MD  Cranberry 400 MG CAPS Take 1 capsule by mouth daily.    Historical Provider, MD  diltiazem (CARDIZEM CD) 240 MG 24 hr capsule TAKE (1) CAPSULE DAILY. 01/27/15   Josue Hector, MD  docusate sodium (COLACE) 100 MG capsule Take 100 mg by mouth as needed.    Historical Provider, MD  fexofenadine (ALLEGRA ODT) 30 MG disintegrating tablet Take 1 tablet (30 mg total) by mouth daily. 10/09/14   Asencion Partridge Dohmeier, MD  fish  oil-omega-3 fatty acids 1000 MG capsule Take 1 g by mouth daily.     Historical Provider, MD  losartan-hydrochlorothiazide (HYZAAR) 100-12.5 MG per tablet TAKE 1 TABLET ONCE DAILY. 01/27/15   Josue Hector, MD  Misc Natural Products [facility]) CAPS Take 1 capsule by mouth daily.    Historical Provider, MD  multivitamin [facility]) per tablet Take 1 tablet by mouth daily.    Historical Provider, MD  oxymetazoline (AFRIN) 0.05 % nasal spray Place 1 spray into both nostrils daily as needed for congestion.    Historical Provider, MD  Pomegranate 250 MG CAPS Take 1 capsule by mouth daily.    Historical Provider, MD  saccharomyces boulardii (FLORASTOR) 250 MG capsule Take 250 mg by mouth 2 (two) times daily.    Historical Provider, MD  valACYclovir (VALTREX) 1000 MG tablet Take 1,000 mg by mouth as needed.    Historical Provider, MD    ALLERGIES:  Allergies  Allergen Reactions  . Combigan [Brimonidine Tartrate-Timolol] Itching    Itching eyes    SOCIAL HISTORY:  History  Substance Use Topics  . Smoking status: Former Smoker    Types: Cigarettes    Quit date: 12/18/1965  . Smokeless tobacco: Never Used  . Alcohol Use: Yes     Comment: 5-7 drinks per week  FAMILY HISTORY: Family History  Problem Relation Age of Onset  . Stroke Father   . Stroke Mother   . Stroke Sister   . Breast cancer Sister   . High blood pressure Sister   . Diabetes Sister   . Colon cancer      Uncle  . Colon cancer Paternal Uncle   . Rectal cancer Neg Hx   . Stomach cancer Neg Hx     EXAM: BP 147/75 mmHg  Pulse 67  Temp(Src) 97.7 F (36.5 C) (Oral)  Resp 18  SpO2 97% CONSTITUTIONAL: Alert and oriented and responds appropriately to questions. Well-appearing; well-nourished HEAD: Normocephalic EYES: Conjunctivae clear, PERRL, no conjunctival pallor ENT: normal nose; no rhinorrhea; moist mucous membranes; pharynx without lesions noted, turbinates are inflamed but there is no active bleeding,  no blood in the posterior oropharynx NECK: Supple, no meningismus, no LAD  CARD: RRR; S1 and S2 appreciated; no murmurs, no clicks, no rubs, no gallops RESP: Normal chest excursion without splinting or tachypnea; breath sounds clear and equal bilaterally; no wheezes, no rhonchi, no rales,  ABD/GI: Normal bowel sounds; non-distended; soft, non-tender, no rebound, no guarding BACK:  The back appears normal and is non-tender to palpation, there is no CVA tenderness EXT: Normal ROM in all joints; non-tender to palpation; no edema; normal capillary refill; no cyanosis    SKIN: Normal color for age and race; warm NEURO: Moves all extremities equally PSYCH: The patient's mood and manner are appropriate. Grooming and personal hygiene are appropriate.  MEDICAL DECISION MAKING: Patient here with nosebleed has stopped after using Afrin nasal spray and pressure. Likely secondary to dry mucous membranes secondary to weather. Have advised him to use over-the-counter nasal saline. Discussed with him return precautions and what to do at home of his nose begins to bleed again. Patient and wife verbalize understanding and are comfortable with plan.       [place], DO 02/06/15 [phone]

## 2015-04-07 ENCOUNTER — Encounter: Payer: Self-pay | Admitting: Cardiovascular Disease

## 2015-08-24 ENCOUNTER — Other Ambulatory Visit: Payer: Self-pay | Admitting: Cardiovascular Disease

## 2015-10-11 ENCOUNTER — Encounter: Payer: Self-pay | Admitting: Neurology

## 2015-10-11 ENCOUNTER — Ambulatory Visit (INDEPENDENT_AMBULATORY_CARE_PROVIDER_SITE_OTHER): Payer: PPO | Admitting: Neurology

## 2015-10-11 VITALS — BP 142/82 | HR 62 | Resp 20 | Ht 74.0 in | Wt 203.0 lb

## 2015-10-11 DIAGNOSIS — G4733 Obstructive sleep apnea (adult) (pediatric): Secondary | ICD-10-CM | POA: Diagnosis not present

## 2015-10-11 DIAGNOSIS — Z9989 Dependence on other enabling machines and devices: Principal | ICD-10-CM

## 2015-10-11 NOTE — Progress Notes (Signed)
[facility]  Provider:  Larey Lane, [facility]  Referring Provider: Darcus Austin, MD Primary Care Physician:  Jonathan Smolder, MD  Chief Complaint  Patient presents with  . Follow-up    cpap follow up, rm 10, alone   Dear Dr. Inda Lane ,   Thank you for allowing me to participate in your patient's sleep medical care:   HPI:  Jonathan Lane is a 74 y.o., caucasian, married, right handed male , who is seen here upon referral from Dr. Inda Lane for a sleep evaluation, [facility] Lane has continued to lose weight and is actually visibly slender, he has less snoring which is confirmed by his spouse. The patient underwent a polysomnography on 05-20-14 which documented an AHI of 7.2 and an RDI of 7.2 as well. The sleep efficiency was 77% the patient AHI was greatest during REM sleep and partially depending on sleep position. During REM sleep the AHI was 15.1 and in supine sleep 8.0. The last option saturation was 86% is about 45 minutes of desaturations. Heart rate was borderline low between 49 and 51 beats per minute.  The patient's main problem seems to be fatigue as well as a slightly elevated daytime sleepiness with an Epworth of 12 points. Be discussed today treatment options for this mild sleep apnea. Since REM sleep seems to accentuate the sleep apnea positive airway pressure therapy is a viable option to treat him it would be a very small pressure [facility] for about 14 days. Alternative therapies oppositional which the patient already is aware of.  The position of sleep however did not accentuated apneas very significantly. Since most of the apneas were actually REM dependent medication to suppress  REM sleep or prolonged the REM latency , which is also a viable option. The patient has  retrognathia and a dental advancement therapy could be addressed should these other options fail.  The patient's usual bedtime is around 12:00, falls asleep promptly , rises in the  morning between 7 and 8 AM. Appears not to be asleep for the full  interval. He states that he wakes up frequently about every 30 minutes after an initial arousal at about 4 AM. He's not sure why he wakes up so frequently; he is not in pain, he does not have shortness of breath or choking, nor nightmares. Goes to  the bathroom between one or 2 times at night.  He estimates his total sleep time to be close to 5.5 hours. He has been witnessed to snore and his wife reports him to have apnea. His wife reports that he would have crescendo breathing, that after a period of shallow breathing or stopping to breathe he would seemingly gasp for air and  jerk. He has daytime excessive sleepiness and severe fatigue. The patient reports no refreshing sleep and non restorative sleep for well over 2 decades. He was employed at [employer]. He retired in 2000.  He was working irregular hours , 60 hrs. /week . No caffeine, his PVCs are resolved, rare ETOH, no tobacco use. He has a known history of nasal septal deviation, had a tonsillectomy in childhood that caused a bleeding, and has a history of PVCs. Non smoker, but 5-6 alcoholic drinks a week.   He is in the process of losing weight ( 30 pounds already ) , but has continued to snore, and he has nocturia, significant retrognathia. He has a mustache.  He has been evaluated for another adenoid surgery .  Treatment plan and additional workup :   Not interested in sinus surgery , has a blocked right nasion, on Afrin. I will write for fexofenadine daily use and urged him to hydrate well , try Mucinex.   Saline nose rinse.   10-11-15, We are is in today for compliance report on [employer] CPAP use he has been 100% compliant in number of days and 97% compliance for over 4 hours of daily use. His average user time is 6 hours and 34 minutes his machine is on auto set, between 4 and 10 cm water with full-time and expiratory pressure relief of 3 cm water.  His AHI 0.6 he still has moderate severe air leaks his 95th percentile pressure is 8.2 cm water. There needs to be no adjustments made.  He has reported neither headaches, nor poor sleep. He is refreshed.   Review of Systems: Out of a complete 14 system review, the patient complains of only the following symptoms, and all other reviewed systems are negative. 10-11-15 , His Epworth sleepiness score today is 3 fatigue severity 15 and the geriatric depression score was endorsed at 0 points. Runny nose, nasal voice. Abdominal obesity, slender extremities. Retrognathia.    Social History   Social History  . Marital Status: Married    Spouse Name: [employer] Lane  . Number of Children: 1  . Years of Education: Masters   Occupational History  .     Social History Main Topics  . Smoking status: Former Smoker    Types: Cigarettes    Quit date: 12/18/1965  . Smokeless tobacco: Never Used  . Alcohol Use: Yes     Comment: 5-7 drinks per week  . Drug Use: No  . Sexual Activity: Not on file   Other Topics Concern  . Not on file   Social History Narrative   Patient is married [employer]).   Patient is retired.   Patient has one adult child.   Patient does not drink any caffeine.   Patient is right-handed.   Patient has a Scientist, water quality.             Family History  Problem Relation Age of Onset  . Stroke Father   . Stroke Mother   . Stroke Sister   . Breast cancer Sister   . High blood pressure Sister   . Diabetes Sister   . Colon cancer      Uncle  . Colon cancer Paternal Uncle   . Rectal cancer Neg Hx   . Stomach cancer Neg Hx     Past Medical History  Diagnosis Date  . Hyperlipidemia   . Hypertension   . Bradycardia   . Heart palpitations   . Cardiac murmur   . Atrial fibrillation ([place])   . Dizziness   . Bradycardia   . Snoring   . Sleep apnea     uses cpap  . Allergy   . Glaucoma     Past Surgical History  Procedure Laterality Date  . Tonsillectomy      as a  child  . Appendectomy  1965  . Colonoscopy    . Polypectomy      Current Outpatient Prescriptions  Medication Sig Dispense Refill  . aspirin 81 MG tablet Take 81 mg by mouth daily.     . Coenzyme Q10 (CO Q 10 PO) Take 1 Can by mouth daily.    . COSOPT PF 22.3-6.8 MG/ML SOLN Apply 1 drop to eye 2 (two) times daily.  Each eye     . Cranberry 400 MG CAPS Take 1 capsule by mouth daily.    Marland Kitchen diltiazem (CARDIZEM CD) 240 MG 24 hr capsule TAKE (1) CAPSULE DAILY. 30 capsule 3  . docusate sodium (COLACE) 100 MG capsule Take 100 mg by mouth as needed.    . fexofenadine (ALLEGRA ODT) 30 MG disintegrating tablet Take 1 tablet (30 mg total) by mouth daily. 30 tablet 0  . fish oil-omega-3 fatty acids 1000 MG capsule Take 1 g by mouth daily.     Marland Kitchen losartan-hydrochlorothiazide (HYZAAR) 100-12.5 MG per tablet TAKE 1 TABLET ONCE DAILY. 30 tablet 3  . Misc Natural Products (PROSTATE HEALTH) CAPS Take 1 capsule by mouth daily.    . multivitamin (THERAGRAN) per tablet Take 1 tablet by mouth daily.    Marland Kitchen oxymetazoline (AFRIN) 0.05 % nasal spray Place 1 spray into both nostrils daily as needed for congestion.    . Pomegranate 250 MG CAPS Take 1 capsule by mouth daily.    Marland Kitchen saccharomyces boulardii (FLORASTOR) 250 MG capsule Take 250 mg by mouth 2 (two) times daily.    . sodium chloride (OCEAN) 0.65 % SOLN nasal spray Place 2 sprays into both nostrils as needed for congestion. 30 mL 0  . UNABLE TO FIND Med Name: Lantanoprost 0.05% 1 drop in each eye at bedtime.    . valACYclovir (VALTREX) 1000 MG tablet Take 1,000 mg by mouth as needed.     No current facility-administered medications for this visit.    Allergies as of 10/11/2015 - Review Complete 10/11/2015  Allergen Reaction Noted  . Combigan [brimonidine tartrate-timolol] Itching 01/01/2012    Vitals: BP 142/82 mmHg  Pulse 62  Resp 20  Ht 6\' 2"  (1.88 m)  Wt 203 lb (92.08 kg)  BMI 26.05 kg/m2 Last Weight:  Wt Readings from Last 1 Encounters:   10/11/15 203 lb (92.08 kg)   Last Height:   Ht Readings from Last 1 Encounters:  10/11/15 6\' 2"  (1.88 m)    Physical exam:  General: The patient is awake, alert and appears not in acute distress. The patient is well groomed. Head: Normocephalic, atraumatic.  Neck is supple. Mallampati 3 , neck circumference: 16, retrognathia, reduced air space.   TMJ click on both sides.   Cardiovascular:  Regular rate and rhythm , without  murmurs or carotid bruit, and without distended neck veins. Respiratory: Lungs are clear to auscultation. Skin:  Without evidence of edema, or rash Trunk: BMI  normal posture.  Neurologic exam : The patient is awake and alert, oriented to place and time.   Memory subjective described as intact.  Speech is fluent without dysarthria, but with nasal dysphonia . Mood and affect are appropriate.  Cranial nerves: Pupils are equal and briskly reactive to light. Funduscopic exam without  evidence of pallor or edema.  Extraocular movements  in vertical and horizontal planes intact and without nystagmus. Visual fields by finger perimetry are intact. Hearing to finger rub intact.  Facial sensation intact to fine touch. Facial motor strength is symmetric and tongue and uvula move midline.  Motor exam:  Normal tone , muscle bulk and symmetric  in all extremities. Droopy shoulders. No Gynaecomastia.   Sensory:  Fine touch, pinprick and vibration were tested in all extremities.  Proprioception is tested in the upper extremities only. This was normal.  Coordination: Rapid alternating movements in the fingers/hands is tested and normal.  Finger-to-nose maneuver tested and normal without evidence of ataxia, dysmetria or tremor.  Gait and station: Patient walks without assistive device .  Deep tendon reflexes: in the upper and lower extremities are symmetric and intact. Babinski maneuver response is downgoing.   Assessment:  After physical and neurologic examination,  review of laboratory studies, imaging, neurophysiology testing and pre-existing records, assessment is OSA. More than 50% of the face to face time during this 20 minute visit was dedicated to advancing treatments and arising treatments in sleep apnea. [employer] will continue to use CPAP. He could change to a dental device since his weight loss allowed for alternatives to CPAP.     Plan:   Continue CPAP use, Rv in 12 month.  Cc Dr. Inda Lane

## 2015-12-17 ENCOUNTER — Other Ambulatory Visit: Payer: Self-pay | Admitting: Cardiovascular Disease

## 2016-01-10 NOTE — Progress Notes (Signed)
Patient ID: Jonathan Lane, male   DOB: 1941-12-05, 75 y.o.   MRN: [id number] Jonathan Lane is seen today for F/U of dizzyness and HTN. He seems overly concerned about his BP. He occasionally gets hight readings at home. In our office he is always non-postural and in the [id number] sytolic range. I did speak with him last time about the effects of ETOH making BP more labile. He had his prostate biopsy with Dr Era Bumpers and fortuately his does not have CA. I believe this stress may have had something to do with it. His visual problems and dizzyness are resolved He denies SSCP, diaphoresis, dyspnea ore edema. He has been compliant with his meds Has some soreness in the medial aspect of his right elbow "golfers elbow" even though he only plays tennis. Has decreased ASA to 81 mg. Suggested Naproxen for NSAI Weight is up and not exercising.   Wife [name] is a patient of mine with chronic back problems  Much better on high dose tramadol 8 pills/day Recent travel to [place] and flu   Has lost weight by eating better    ROS: Denies fever, malais, weight loss, blurry vision, decreased visual acuity, cough, sputum, SOB, hemoptysis, pleuritic pain, palpitaitons, heartburn, abdominal pain, melena, lower extremity edema, claudication, or rash.  All other systems reviewed and negative  General: Affect appropriate Healthy:  appears stated age 75: normal Neck supple with no adenopathy JVP normal no bruits no thyromegaly Lungs clear with no wheezing and good diaphragmatic motion Heart:  S1/S2 SEM  murmur, no rub, gallop or click PMI normal Abdomen: benighn, BS positve, no tenderness, no AAA no bruit.  No HSM or HJR Distal pulses intact with no bruits No edema Neuro non-focal Skin warm and dry No muscular weakness   Current Outpatient Prescriptions  Medication Sig Dispense Refill  . aspirin 81 MG tablet Take 81 mg by mouth daily.     . Coenzyme Q10 (CO Q 10 PO) Take 1 capsule by mouth daily.     . COSOPT  PF 22.3-6.8 MG/ML SOLN Place 1 drop into both eyes 2 (two) times daily.     . Cranberry 400 MG CAPS Take 1 capsule by mouth daily.    Marland Kitchen diltiazem (DILACOR XR) 240 MG 24 hr capsule Take 240 mg by mouth daily.    . fexofenadine (ALLEGRA ODT) 30 MG disintegrating tablet Take 1 tablet (30 mg total) by mouth daily. 30 tablet 0  . fish oil-omega-3 fatty acids 1000 MG capsule Take 1 g by mouth daily.     Marland Kitchen latanoprost (XALATAN) 0.005 % ophthalmic solution Place 1 drop into both eyes at bedtime.    Marland Kitchen losartan-hydrochlorothiazide (HYZAAR) 100-12.5 MG tablet Take 1 tablet by mouth daily.    . Misc Natural Products (PROSTATE HEALTH) CAPS Take 1 capsule by mouth daily.    . multivitamin (THERAGRAN) per tablet Take 1 tablet by mouth daily.    Marland Kitchen oxymetazoline (AFRIN) 0.05 % nasal spray Place 1 spray into both nostrils daily as needed for congestion.    . polyethylene glycol (MIRALAX / GLYCOLAX) packet Take 17 g by mouth every other day.    . Pomegranate 250 MG CAPS Take 1 capsule by mouth daily.    Marland Kitchen saccharomyces boulardii (FLORASTOR) 250 MG capsule Take 250 mg by mouth 2 (two) times daily.    . sodium chloride (OCEAN) 0.65 % SOLN nasal spray Place 2 sprays into both nostrils as needed for congestion. 30 mL 0  . valACYclovir (VALTREX) 1000  MG tablet Take 1,000 mg by mouth as directed.      No current facility-administered medications for this visit.    Allergies  Combigan  Electrocardiogram:  12/04/13  SR rate 56  Nonspecific ST changes  01/13/15 SR rate 54  Normal   01/13/16 SR rate 61 PVC otherwise normal   Assessment and Plan  HTN:  Well controlled diet and exercise helping most Murmur:  Benign SEM no need for echo  Prostate:  PSA back down biopsy negative f/u [facility]baum PVC: on ECG asymptomatic observe   [name]

## 2016-01-11 ENCOUNTER — Encounter: Payer: Self-pay | Admitting: Cardiovascular Disease

## 2016-01-13 ENCOUNTER — Ambulatory Visit (INDEPENDENT_AMBULATORY_CARE_PROVIDER_SITE_OTHER): Payer: PPO | Admitting: Cardiovascular Disease

## 2016-01-13 ENCOUNTER — Encounter: Payer: Self-pay | Admitting: Cardiovascular Disease

## 2016-01-13 VITALS — BP 142/70 | HR 58 | Ht 74.0 in | Wt 205.8 lb

## 2016-01-13 DIAGNOSIS — I493 Ventricular premature depolarization: Secondary | ICD-10-CM

## 2016-01-13 DIAGNOSIS — I10 Essential (primary) hypertension: Secondary | ICD-10-CM | POA: Diagnosis not present

## 2016-01-13 MED ORDER — DILTIAZEM HCL ER 240 MG PO CP24: 240.0000 mg | ORAL_CAPSULE | Freq: Every day | ORAL | Status: DC

## 2016-01-13 MED ORDER — LOSARTAN POTASSIUM-HCTZ 100-12.5 MG PO TABS: 1.0000 | ORAL_TABLET | Freq: Every day | ORAL | Status: DC

## 2016-01-13 NOTE — Patient Instructions (Signed)
Medication Instructions:  Your physician recommends that you continue on your current medications as directed. Please refer to the Current Medication list given to you today.  Labwork: NONE  Testing/Procedures: NONE  Follow-Up: Your physician wants you to follow-up in:1 year with Dr. Nishan. You will receive a reminder letter in the mail two months in advance. If you don't receive a letter, please call our office to schedule the follow-up appointment.   If you need a refill on your cardiac medications before your next appointment, please call your pharmacy.    

## 2016-01-25 DIAGNOSIS — Z86018 Personal history of other benign neoplasm: Secondary | ICD-10-CM | POA: Diagnosis not present

## 2016-01-25 DIAGNOSIS — L821 Other seborrheic keratosis: Secondary | ICD-10-CM | POA: Diagnosis not present

## 2016-01-25 DIAGNOSIS — Z85828 Personal history of other malignant neoplasm of skin: Secondary | ICD-10-CM | POA: Diagnosis not present

## 2016-01-25 DIAGNOSIS — D485 Neoplasm of uncertain behavior of skin: Secondary | ICD-10-CM | POA: Diagnosis not present

## 2016-01-25 DIAGNOSIS — D0471 Carcinoma in situ of skin of right lower limb, including hip: Secondary | ICD-10-CM | POA: Diagnosis not present

## 2016-01-25 DIAGNOSIS — D225 Melanocytic nevi of trunk: Secondary | ICD-10-CM | POA: Diagnosis not present

## 2016-01-25 DIAGNOSIS — Z23 Encounter for immunization: Secondary | ICD-10-CM | POA: Diagnosis not present

## 2016-02-10 DIAGNOSIS — D0471 Carcinoma in situ of skin of right lower limb, including hip: Secondary | ICD-10-CM | POA: Diagnosis not present

## 2016-04-08 ENCOUNTER — Encounter: Payer: Self-pay | Admitting: Neurology

## 2016-06-29 DIAGNOSIS — G4733 Obstructive sleep apnea (adult) (pediatric): Secondary | ICD-10-CM | POA: Diagnosis not present

## 2016-06-29 DIAGNOSIS — M2610 Unspecified anomaly of jaw-cranial base relationship: Secondary | ICD-10-CM | POA: Diagnosis not present

## 2016-06-30 DIAGNOSIS — E782 Mixed hyperlipidemia: Secondary | ICD-10-CM | POA: Diagnosis not present

## 2016-06-30 DIAGNOSIS — Z0000 Encounter for general adult medical examination without abnormal findings: Secondary | ICD-10-CM | POA: Diagnosis not present

## 2016-06-30 DIAGNOSIS — I10 Essential (primary) hypertension: Secondary | ICD-10-CM | POA: Diagnosis not present

## 2016-06-30 DIAGNOSIS — N400 Enlarged prostate without lower urinary tract symptoms: Secondary | ICD-10-CM | POA: Diagnosis not present

## 2016-08-23 DIAGNOSIS — E782 Mixed hyperlipidemia: Secondary | ICD-10-CM | POA: Diagnosis not present

## 2016-08-31 DIAGNOSIS — Z86018 Personal history of other benign neoplasm: Secondary | ICD-10-CM | POA: Diagnosis not present

## 2016-08-31 DIAGNOSIS — Z85828 Personal history of other malignant neoplasm of skin: Secondary | ICD-10-CM | POA: Diagnosis not present

## 2016-08-31 DIAGNOSIS — C44319 Basal cell carcinoma of skin of other parts of face: Secondary | ICD-10-CM | POA: Diagnosis not present

## 2016-08-31 DIAGNOSIS — L820 Inflamed seborrheic keratosis: Secondary | ICD-10-CM | POA: Diagnosis not present

## 2016-08-31 DIAGNOSIS — D485 Neoplasm of uncertain behavior of skin: Secondary | ICD-10-CM | POA: Diagnosis not present

## 2016-08-31 DIAGNOSIS — D225 Melanocytic nevi of trunk: Secondary | ICD-10-CM | POA: Diagnosis not present

## 2016-08-31 DIAGNOSIS — L570 Actinic keratosis: Secondary | ICD-10-CM | POA: Diagnosis not present

## 2016-10-10 ENCOUNTER — Encounter: Payer: Self-pay | Admitting: Neurology

## 2016-10-10 ENCOUNTER — Ambulatory Visit (INDEPENDENT_AMBULATORY_CARE_PROVIDER_SITE_OTHER): Payer: PPO | Admitting: Neurology

## 2016-10-10 VITALS — BP 126/64 | HR 50 | Resp 20 | Ht 74.0 in | Wt 195.0 lb

## 2016-10-10 DIAGNOSIS — G4733 Obstructive sleep apnea (adult) (pediatric): Secondary | ICD-10-CM

## 2016-10-10 DIAGNOSIS — Z9989 Dependence on other enabling machines and devices: Secondary | ICD-10-CM | POA: Diagnosis not present

## 2016-10-10 NOTE — Progress Notes (Signed)
[facility]  Provider:  Larey Seat, [place] D  Referring Provider: Darcus Austin, MD Primary Care Physician:  Marjorie Smolder, MD  Chief Complaint  Patient presents with  . Follow-up    has used cpap in 6 months   Dear Dr. Inda Merlin ,   Thank you for allowing me to participate in your patient's sleep medical care:   HPI:  Jonathan Lane is a 75 y.o., caucasian, married, right handed male , who is seen here upon referral from Dr. Inda Merlin for a sleep evaluation, Toon has continued to lose weight and is actually visibly slender, he has less snoring which is confirmed by his spouse. The patient underwent a polysomnography on 05-20-14 which documented an AHI of 7.2 and an RDI of 7.2 as well. The sleep efficiency was 77% the patient AHI was greatest during REM sleep and partially depending on sleep position. During REM sleep the AHI was 15.1 and in supine sleep 8.0. The last option saturation was 86% is about 45 minutes of desaturations. Heart rate was borderline low between 49 and 51 beats per minute.  The patient's main problem seems to be fatigue as well as a slightly elevated daytime sleepiness with an Epworth of 12 points. Be discussed today treatment options for this mild sleep apnea. Since REM sleep seems to accentuate the sleep apnea positive airway pressure therapy is a viable option to treat him it would be a very small pressure [name] for about 14 days. Alternative therapies oppositional which the patient already is aware of.  The position of sleep however did not accentuated apneas very significantly. Since most of the apneas were actually REM dependent medication to suppress  REM sleep or prolonged the REM latency , which is also a viable option. The patient has  retrognathia and a dental advancement therapy could be addressed should these other options fail.  The patient's usual bedtime is around 12:00, falls asleep promptly , rises in the  morning between 7 and 8 AM. Appears not to be asleep for the full  interval. He states that he wakes up frequently about every 30 minutes after an initial arousal at about 4 AM. He's not sure why he wakes up so frequently; he is not in pain, he does not have shortness of breath or choking, nor nightmares. Goes to  the bathroom between one or 2 times at night.  He estimates his total sleep time to be close to 5.5 hours. He has been witnessed to snore and his wife reports him to have apnea. His wife reports that he would have crescendo breathing, that after a period of shallow breathing or stopping to breathe he would seemingly gasp for air and  jerk. He has daytime excessive sleepiness and severe fatigue. The patient reports no refreshing sleep and non restorative sleep for well over 2 decades. He was employed at [employer]. He retired in 2000.  He was working irregular hours , 60 hrs. /week . No caffeine, his PVCs are resolved, rare ETOH, no tobacco use. He has a known history of nasal septal deviation, had a tonsillectomy in childhood that caused a bleeding, and has a history of PVCs. Non smoker, 5-6 alcoholic drinks a week.   He is in the process of losing weight ( 30 pounds already ) , but has continued to snore, and he has nocturia, significant retrognathia. He has a mustache.  He has been evaluated for another adenoid surgery . Treatment  plan and additional workup : Not interested in sinus surgery, has a blocked right nasion, on Afrin. I will write for fexofenadine daily use and urged him to hydrate well , try Mucinex.   Saline nose rinse.   10-11-2015, We are is in today for compliance report on Mr. Lazzara CPAP use he has been 100% compliant in number of days and 97% compliance for over 4 hours of daily use. His average user time is 6 hours and 34 minutes his machine is on auto set, between 4 and 10 cm water with full-time and expiratory pressure relief of 3 cm water. His AHI  0.6 he still has moderate severe air leaks his 95th percentile pressure is 8.2 cm water. There needs to be no adjustments made.  He has reported neither headaches, nor poor sleep. He is refreshed.   10-10-2016, I have the pleasure of seeing Mr. Constance Haw today, reported a normal degree of daytime sleepiness with an Epworth score of 7, fatigue severity only 17, geriatric depression score 2 out of 15. He is an compliant CPAP user with 87% compliance average user time of 5 hours and 10 minutes each night. He's using an AutoSet between 4 and 10 cm water pressure with 3 cm EPR. 95th percentile pressure is 7.9 and his residual AHI is 0.7 excellent resolution. He states that he has a little bit sleepier lately than he used to be and that he also has trouble to get to sleep before 2 AM if he takes daytime naps. He has therefore eliminate those over 30 minutes.   Review of Systems: Out of a complete 14 system review, the patient complains of only the following symptoms, and all other reviewed systems are negative. 10-11-15 , His Epworth sleepiness score today is  7 fatigue severity  17  and the geriatric depression score was endorsed at 2/15  points. Abdominal obesity, slender extremities. Retrognathia.    Social History   Social History  . Marital status: Married    Spouse name: [name]  . Number of children: 1  . Years of education: Masters   Occupational History  .  Retired   Social History Main Topics  . Smoking status: Former Smoker    Types: Cigarettes    Quit date: 12/18/1965  . Smokeless tobacco: Never Used  . Alcohol use Yes     Comment: 5-7 drinks per week  . Drug use: No  . Sexual activity: Not on file   Other Topics Concern  . Not on file   Social History Narrative   Patient is married [name]).   Patient is retired.   Patient has one adult child.   Patient does not drink any caffeine.   Patient is right-handed.   Patient has a Scientist, water quality.             Family History   Problem Relation Age of Onset  . Stroke Father   . Stroke Mother   . Stroke Sister   . Breast cancer Sister   . High blood pressure Sister   . Diabetes Sister   . Colon cancer      Uncle  . Colon cancer Paternal Uncle   . Rectal cancer Neg Hx   . Stomach cancer Neg Hx     Past Medical History:  Diagnosis Date  . Allergy   . Atrial fibrillation ([place])   . Bradycardia   . Bradycardia   . Cardiac murmur   . Dizziness   . Glaucoma   .  Heart palpitations   . Hyperlipidemia   . Hypertension   . Sleep apnea    uses cpap  . Snoring     Past Surgical History:  Procedure Laterality Date  . APPENDECTOMY  1965  . COLONOSCOPY    . POLYPECTOMY    . TONSILLECTOMY     as a child    Current Outpatient Prescriptions  Medication Sig Dispense Refill  . aspirin 81 MG tablet Take 81 mg by mouth daily.     Marland Kitchen atorvastatin (LIPITOR) 10 MG tablet Take 10 mg by mouth daily.    . Coenzyme Q10 (CO Q 10 PO) Take 1 capsule by mouth daily.     . COSOPT PF 22.3-6.8 MG/ML SOLN Place 1 drop into both eyes 2 (two) times daily.     . Cranberry 400 MG CAPS Take 1 capsule by mouth daily.    Marland Kitchen diltiazem (DILACOR XR) 240 MG 24 hr capsule Take 1 capsule (240 mg total) by mouth daily. 90 capsule 3  . fish oil-omega-3 fatty acids 1000 MG capsule Take 1 g by mouth 2 (two) times daily.     Marland Kitchen latanoprost (XALATAN) 0.005 % ophthalmic solution Place 1 drop into both eyes at bedtime.    Marland Kitchen losartan-hydrochlorothiazide (HYZAAR) 100-12.5 MG tablet Take 1 tablet by mouth daily. 90 tablet 3  . Misc Natural Products (PROSTATE HEALTH) CAPS Take 1 capsule by mouth daily.    . multivitamin (THERAGRAN) per tablet Take 1 tablet by mouth daily.    Marland Kitchen oxymetazoline (AFRIN) 0.05 % nasal spray Place 1 spray into both nostrils daily as needed for congestion.    . polyethylene glycol (MIRALAX / GLYCOLAX) packet Take 17 g by mouth every other day.    . Pomegranate 250 MG CAPS Take 1 capsule by mouth daily.    . sodium chloride  (OCEAN) 0.65 % SOLN nasal spray Place 2 sprays into both nostrils as needed for congestion. 30 mL 0  . UNABLE TO FIND Med Name: Focus Factor    . valACYclovir (VALTREX) 1000 MG tablet Take 1,000 mg by mouth as directed.      No current facility-administered medications for this visit.     Allergies as of 10/10/2016 - Review Complete 10/10/2016  Allergen Reaction Noted  . Combigan [brimonidine tartrate-timolol] Itching 01/01/2012    Vitals: BP 126/64   Pulse (!) 50   Resp 20   Ht 6\' 2"  (1.88 m)   Wt 195 lb (88.5 kg)   BMI 25.04 kg/m  Last Weight:  Wt Readings from Last 1 Encounters:  10/10/16 195 lb (88.5 kg)   Last Height:   Ht Readings from Last 1 Encounters:  10/10/16 6\' 2"  (1.88 m)    Physical exam:  General: The patient is awake, alert and appears not in acute distress. The patient is well groomed. Head: Normocephalic, atraumatic.  Neck is supple. Mallampati 3, neck circumference 16, retrognathia, reduced air space.   TMJ click on both sides.   Cardiovascular: Regular rate and rhythm, without  murmurs or carotid bruit, and without distended neck veins. Respiratory: Lungs are clear to auscultation. Skin:  Without evidence of edema, or rash Trunk: droopy shoulders,  Erect.   Neurologic exam : The patient is awake and alert, oriented to place and time.  Memory subjective described as intact.  Speech is fluent without dysarthria, but with nasal dysphonia. Mood and affect are appropriate.  Cranial nerves: Pupils are equal and briskly reactive to light.  Extraocular movements  in vertical  and horizontal planes intact and without nystagmus. Visual fields by finger perimetry are intact. Hearing to finger rub intact.  Facial sensation intact to fine touch. Facial motor strength is symmetric and tongue and uvula move midline. Motor exam: Normal tone, muscle bulk and symmetric in all extremities. Droopy shoulders. No Gynaecomastia.  Deep tendon reflexes: in the upper and lower  extremities are symmetric and intact. Babinski maneuver response is downgoing.  Assessment:  After physical and neurologic examination, review of laboratory studies, imaging, neurophysiology testing and pre-existing records, assessment is OSA. More than 50% of the face to face time during this 20 minute visit was dedicated to advancing treatments and arising treatments in sleep apnea. Mr. Thelen will continue to use CPAP. He could change to a dental device since his weight loss allowed for alternatives to CPAP.   Plan: Continue CPAP use, Rv in 12 month.  Cc Dr. Dow Adolph, MD

## 2016-10-17 DIAGNOSIS — C44319 Basal cell carcinoma of skin of other parts of face: Secondary | ICD-10-CM | POA: Diagnosis not present

## 2017-01-11 ENCOUNTER — Other Ambulatory Visit: Payer: Self-pay | Admitting: *Deleted

## 2017-01-11 MED ORDER — DILTIAZEM HCL ER 240 MG PO CP24: 240.0000 mg | ORAL_CAPSULE | Freq: Every day | ORAL | 0 refills | Status: DC

## 2017-01-11 MED ORDER — LOSARTAN POTASSIUM-HCTZ 100-12.5 MG PO TABS: 1.0000 | ORAL_TABLET | Freq: Every day | ORAL | 0 refills | Status: DC

## 2017-02-04 NOTE — Progress Notes (Signed)
Patient ID: Jonathan Lane, male   DOB: 1941/01/04, 76 y.o.   MRN: [id number]   Jonathan Lane is seen today for F/U of dizziness and HTN. BP is labile likely due to ETOH . Sees Jonathan Lane for elevated PSA with negative Biopsy for cancer  I believe this stress may have had something to do with it.  He denies SSCP, diaphoresis, dyspnea ore edema. He has been compliant with his meds   Wife Jonathan Lane  is a patient of mine with chronic back problems  Much better on high dose tramadol 8 pills/day Recent travel to [place] and flu   Has lost weight by eating better    ROS: Denies fever, malais, weight loss, blurry vision, decreased visual acuity, cough, sputum, SOB, hemoptysis, pleuritic pain, palpitaitons, heartburn, abdominal pain, melena, lower extremity edema, claudication, or rash.  All other systems reviewed and negative  General: Affect appropriate Healthy:  appears stated age 1: normal Neck supple with no adenopathy JVP normal no bruits no thyromegaly Lungs clear with no wheezing and good diaphragmatic motion Heart:  S1/S2 SEM  murmur, no rub, gallop or click PMI normal Abdomen: benighn, BS positve, no tenderness, no AAA no bruit.  No HSM or HJR Distal pulses intact with no bruits No edema Neuro non-focal Skin warm and dry No muscular weakness   Current Outpatient Prescriptions  Medication Sig Dispense Refill  . aspirin 81 MG tablet Take 81 mg by mouth daily.     Marland Kitchen atorvastatin (LIPITOR) 10 MG tablet Take 10 mg by mouth daily.    . Coenzyme Q10 (CO Q 10 PO) Take 1 capsule by mouth daily.     . COSOPT PF 22.3-6.8 MG/ML SOLN Place 1 drop into both eyes 2 (two) times daily.     . Cranberry 400 MG CAPS Take 1 capsule by mouth daily.    Marland Kitchen diltiazem (DILACOR XR) 240 MG 24 hr capsule Take 1 capsule (240 mg total) by mouth daily. 90 capsule 3  . fish oil-omega-3 fatty acids 1000 MG capsule Take 1 g by mouth 2 (two) times daily.     Marland Kitchen latanoprost (XALATAN) 0.005 % ophthalmic  solution Place 1 drop into both eyes at bedtime.    Marland Kitchen losartan-hydrochlorothiazide (HYZAAR) 100-12.5 MG tablet Take 1 tablet by mouth daily. 90 tablet 3  . Misc Natural Products (PROSTATE HEALTH) CAPS Take 1 capsule by mouth daily.    . multivitamin (THERAGRAN) per tablet Take 1 tablet by mouth daily.    Marland Kitchen oxymetazoline (AFRIN) 0.05 % nasal spray Place 1 spray into both nostrils daily as needed for congestion.    . polyethylene glycol (MIRALAX / GLYCOLAX) packet Take 17 g by mouth every other day.    . Pomegranate 250 MG CAPS Take 1 capsule by mouth daily.    . sodium chloride (OCEAN) 0.65 % SOLN nasal spray Place 2 sprays into both nostrils as needed for congestion. 30 mL 0  . UNABLE TO FIND Med Name: Focus Factor    . valACYclovir (VALTREX) 1000 MG tablet Take 1,000 mg by mouth as directed.      No current facility-administered medications for this visit.     Allergies  Combigan [brimonidine tartrate-timolol]  Electrocardiogram:  12/04/13  SR rate 56  Nonspecific ST changes  01/13/15 SR rate 54  Normal   01/13/16 SR rate 61 PVC otherwise normal  02/15/17  SR rate 50 PR 212 normal   Assessment and Plan  HTN:  Well controlled diet and exercise helping most  Murmur:  Benign SEM no need for echo  Prostate:  PSA back down biopsy negative f/u Jonathan Lane PVC: on ECG asymptomatic observe  Bradycardia:  Not symptomatic avoid beta blocker consider f/u ETT for chronotropic response If he becomes symptomatic   Jonathan Lane

## 2017-02-15 ENCOUNTER — Ambulatory Visit (INDEPENDENT_AMBULATORY_CARE_PROVIDER_SITE_OTHER): Payer: PPO | Admitting: Cardiovascular Disease

## 2017-02-15 ENCOUNTER — Encounter: Payer: Self-pay | Admitting: Cardiovascular Disease

## 2017-02-15 VITALS — BP 128/68 | HR 50 | Ht 74.0 in | Wt 203.8 lb

## 2017-02-15 DIAGNOSIS — I4891 Unspecified atrial fibrillation: Secondary | ICD-10-CM | POA: Diagnosis not present

## 2017-02-15 DIAGNOSIS — H47329 Drusen of optic disc, unspecified eye: Secondary | ICD-10-CM | POA: Diagnosis not present

## 2017-02-15 DIAGNOSIS — H401132 Primary open-angle glaucoma, bilateral, moderate stage: Secondary | ICD-10-CM | POA: Diagnosis not present

## 2017-02-15 DIAGNOSIS — H2513 Age-related nuclear cataract, bilateral: Secondary | ICD-10-CM | POA: Diagnosis not present

## 2017-02-15 MED ORDER — LOSARTAN POTASSIUM-HCTZ 100-12.5 MG PO TABS: 1.0000 | ORAL_TABLET | Freq: Every day | ORAL | 3 refills | Status: DC

## 2017-02-15 MED ORDER — DILTIAZEM HCL ER 240 MG PO CP24: 240.0000 mg | ORAL_CAPSULE | Freq: Every day | ORAL | 3 refills | Status: DC

## 2017-02-15 NOTE — Patient Instructions (Addendum)

## 2017-03-01 DIAGNOSIS — Z86018 Personal history of other benign neoplasm: Secondary | ICD-10-CM | POA: Diagnosis not present

## 2017-03-01 DIAGNOSIS — Z85828 Personal history of other malignant neoplasm of skin: Secondary | ICD-10-CM | POA: Diagnosis not present

## 2017-03-01 DIAGNOSIS — L821 Other seborrheic keratosis: Secondary | ICD-10-CM | POA: Diagnosis not present

## 2017-03-01 DIAGNOSIS — D224 Melanocytic nevi of scalp and neck: Secondary | ICD-10-CM | POA: Diagnosis not present

## 2017-03-01 DIAGNOSIS — D225 Melanocytic nevi of trunk: Secondary | ICD-10-CM | POA: Diagnosis not present

## 2017-03-01 DIAGNOSIS — Z23 Encounter for immunization: Secondary | ICD-10-CM | POA: Diagnosis not present

## 2017-03-19 DIAGNOSIS — N401 Enlarged prostate with lower urinary tract symptoms: Secondary | ICD-10-CM | POA: Diagnosis not present

## 2017-03-19 DIAGNOSIS — R972 Elevated prostate specific antigen [PSA]: Secondary | ICD-10-CM | POA: Diagnosis not present

## 2017-03-19 DIAGNOSIS — N5201 Erectile dysfunction due to arterial insufficiency: Secondary | ICD-10-CM | POA: Diagnosis not present

## 2017-03-19 DIAGNOSIS — R351 Nocturia: Secondary | ICD-10-CM | POA: Diagnosis not present

## 2017-05-31 DIAGNOSIS — M549 Dorsalgia, unspecified: Secondary | ICD-10-CM | POA: Diagnosis not present

## 2017-07-30 DIAGNOSIS — Z0000 Encounter for general adult medical examination without abnormal findings: Secondary | ICD-10-CM | POA: Diagnosis not present

## 2017-07-30 DIAGNOSIS — E782 Mixed hyperlipidemia: Secondary | ICD-10-CM | POA: Diagnosis not present

## 2017-07-30 DIAGNOSIS — F410 Panic disorder [episodic paroxysmal anxiety] without agoraphobia: Secondary | ICD-10-CM | POA: Diagnosis not present

## 2017-07-30 DIAGNOSIS — N400 Enlarged prostate without lower urinary tract symptoms: Secondary | ICD-10-CM | POA: Diagnosis not present

## 2017-07-30 DIAGNOSIS — I10 Essential (primary) hypertension: Secondary | ICD-10-CM | POA: Diagnosis not present

## 2017-08-22 DIAGNOSIS — H401132 Primary open-angle glaucoma, bilateral, moderate stage: Secondary | ICD-10-CM | POA: Diagnosis not present

## 2017-08-22 DIAGNOSIS — H43813 Vitreous degeneration, bilateral: Secondary | ICD-10-CM | POA: Diagnosis not present

## 2017-08-22 DIAGNOSIS — H43812 Vitreous degeneration, left eye: Secondary | ICD-10-CM | POA: Diagnosis not present

## 2017-08-22 DIAGNOSIS — H353111 Nonexudative age-related macular degeneration, right eye, early dry stage: Secondary | ICD-10-CM | POA: Diagnosis not present

## 2017-08-22 DIAGNOSIS — H353221 Exudative age-related macular degeneration, left eye, with active choroidal neovascularization: Secondary | ICD-10-CM | POA: Diagnosis not present

## 2017-08-22 DIAGNOSIS — H2513 Age-related nuclear cataract, bilateral: Secondary | ICD-10-CM | POA: Diagnosis not present

## 2017-08-31 DIAGNOSIS — D225 Melanocytic nevi of trunk: Secondary | ICD-10-CM | POA: Diagnosis not present

## 2017-08-31 DIAGNOSIS — Z86018 Personal history of other benign neoplasm: Secondary | ICD-10-CM | POA: Diagnosis not present

## 2017-08-31 DIAGNOSIS — L570 Actinic keratosis: Secondary | ICD-10-CM | POA: Diagnosis not present

## 2017-08-31 DIAGNOSIS — Z85828 Personal history of other malignant neoplasm of skin: Secondary | ICD-10-CM | POA: Diagnosis not present

## 2017-08-31 DIAGNOSIS — D485 Neoplasm of uncertain behavior of skin: Secondary | ICD-10-CM | POA: Diagnosis not present

## 2017-08-31 DIAGNOSIS — D0461 Carcinoma in situ of skin of right upper limb, including shoulder: Secondary | ICD-10-CM | POA: Diagnosis not present

## 2017-08-31 DIAGNOSIS — Z23 Encounter for immunization: Secondary | ICD-10-CM | POA: Diagnosis not present

## 2017-08-31 DIAGNOSIS — D224 Melanocytic nevi of scalp and neck: Secondary | ICD-10-CM | POA: Diagnosis not present

## 2017-08-31 DIAGNOSIS — L821 Other seborrheic keratosis: Secondary | ICD-10-CM | POA: Diagnosis not present

## 2017-08-31 DIAGNOSIS — H353221 Exudative age-related macular degeneration, left eye, with active choroidal neovascularization: Secondary | ICD-10-CM | POA: Diagnosis not present

## 2017-10-02 DIAGNOSIS — H353221 Exudative age-related macular degeneration, left eye, with active choroidal neovascularization: Secondary | ICD-10-CM | POA: Diagnosis not present

## 2017-10-02 DIAGNOSIS — H2513 Age-related nuclear cataract, bilateral: Secondary | ICD-10-CM | POA: Diagnosis not present

## 2017-10-02 DIAGNOSIS — H43813 Vitreous degeneration, bilateral: Secondary | ICD-10-CM | POA: Diagnosis not present

## 2017-10-02 DIAGNOSIS — H353111 Nonexudative age-related macular degeneration, right eye, early dry stage: Secondary | ICD-10-CM | POA: Diagnosis not present

## 2017-10-05 DIAGNOSIS — L570 Actinic keratosis: Secondary | ICD-10-CM | POA: Diagnosis not present

## 2017-10-05 DIAGNOSIS — D0461 Carcinoma in situ of skin of right upper limb, including shoulder: Secondary | ICD-10-CM | POA: Diagnosis not present

## 2017-10-11 ENCOUNTER — Ambulatory Visit: Payer: PPO | Admitting: Neurology

## 2017-10-15 ENCOUNTER — Encounter: Payer: Self-pay | Admitting: Nurse Practitioner

## 2017-10-15 DIAGNOSIS — H401131 Primary open-angle glaucoma, bilateral, mild stage: Secondary | ICD-10-CM | POA: Diagnosis not present

## 2017-10-15 DIAGNOSIS — H25813 Combined forms of age-related cataract, bilateral: Secondary | ICD-10-CM | POA: Diagnosis not present

## 2017-10-15 DIAGNOSIS — H353221 Exudative age-related macular degeneration, left eye, with active choroidal neovascularization: Secondary | ICD-10-CM | POA: Diagnosis not present

## 2017-10-15 DIAGNOSIS — H353111 Nonexudative age-related macular degeneration, right eye, early dry stage: Secondary | ICD-10-CM | POA: Diagnosis not present

## 2017-10-16 NOTE — Progress Notes (Signed)
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 Inda Merlin for a sleep evaluation, Jonathan Lane has continued to lose weight and is actually visibly slender, he has less snoring which is confirmed by his spouse. The patient underwent a polysomnography on 05-20-14 which documented an AHI of 7.2 and an RDI of 7.2 as well. The sleep efficiency was 77% the patient AHI was greatest during REM sleep and partially depending on sleep position. During REM sleep the AHI was 15.1 and in supine sleep 8.0. The last option saturation was 86% is about 45 minutes of desaturations. Heart rate was borderline low between 49 and 51 beats per minute.  The patient's main problem seems to be fatigue as well as a slightly elevated daytime sleepiness with an Epworth of 12 points. Be discussed today treatment options for this mild sleep apnea. Since REM sleep seems to accentuate the sleep apnea positive airway pressure therapy is a viable option to treat him it would be a very small pressure Jonathan Lane for about 14 days. Alternative therapies oppositional which the patient already is aware of.  The position of sleep however did not accentuated apneas very significantly. Since most of the apneas were actually REM dependent medication to suppress  REM sleep or prolonged the REM latency , which is also a viable option. The patient has  retrognathia and a dental advancement therapy could be addressed should these other options fail.  The patient's usual bedtime is around 12:00, falls asleep promptly , rises in the morning between 7 and 8 AM. Appears not to be asleep for the full  interval. He states that he wakes up frequently about every 30 minutes after an initial arousal at about 4  AM. He's not sure why he wakes up so frequently; he is not in pain, he does not have shortness of breath or choking, nor nightmares. Goes to  the bathroom between one or 2 times at night.  He estimates his total sleep time to be close to 5.5 hours. He has been witnessed to snore and his wife reports him to have apnea. His wife reports that he would have crescendo breathing, that after a period of shallow breathing or stopping to breathe he would seemingly gasp for air and  jerk. He has daytime excessive sleepiness and severe fatigue. The patient reports no refreshing sleep and non restorative sleep for well over 2 decades. He was employed at [employer]. He retired in 2000.  He was working irregular hours , 60 hrs. /week . No caffeine, his PVCs are resolved, rare ETOH, no tobacco use. He has a known history of nasal septal deviation, had a tonsillectomy in childhood that caused a bleeding, and has a history of PVCs. Non smoker, 5-6 alcoholic drinks a week.   He is in the process of losing weight ( 30 pounds already ) , but has continued to snore, and he has nocturia, significant retrognathia. He has a mustache.  He has been evaluated for another adenoid surgery . Treatment plan and additional workup : Not interested in sinus surgery, has a blocked right nasion, on Afrin. I will write for fexofenadine daily use and urged him to hydrate well , try Mucinex.   Saline nose rinse.  10-11-2015, We are is in today for compliance report on Jonathan Lane CPAP use he has been 100% compliant in number of days and 97% compliance for over 4 hours of daily use. His average user time is 6 hours and 34 minutes his machine is on auto set, between 4 and 10 cm water with full-time and expiratory pressure relief of 3 cm water. His AHI 0.6 he still has moderate severe air leaks his 95th percentile pressure is 8.2 cm water. There needs to be no adjustments made.  He has reported neither headaches, nor poor  sleep. He is refreshed.   10-10-2016, I have the pleasure of seeing Jonathan Lane today, reported a normal degree of daytime sleepiness with an Epworth score of 7, fatigue severity only 17, geriatric depression score 2 out of 15. He is an compliant CPAP user with 87% compliance average user time of 5 hours and 10 minutes each night. He's using an AutoSet between 4 and 10 cm water pressure with 3 cm EPR. 95th percentile pressure is 7.9 and his residual AHI is 0.7 excellent resolution. He states that he has a little bit sleepier lately than he used to be and that he also has trouble to get to sleep before 2 AM if he takes daytime naps. He has therefore eliminate those over 30 minutes.   UPDATE 10/31/2018CM Jonathan Lane, 76 year old male returns for follow-up with history of obstructive sleep apnea. He is here for CPAP compliance. ESS score today is 4 and fatigue severity scale is 9. CPAP compliance data dated 08/17/2017 - 10/15/2017 shows greater than 4 hours at 85% average usage 6 hours 32 minutes. Pressure 4-10 cm EPR 3. AHI 1.1. He uses nasal pillows. He denies afternoon naps. He is still physically active. He returns for reevaluation    REVIEW OF SYSTEMS: Full 14 system review of systems performed and notable only for those listed, all others are neg:  Constitutional: neg  Cardiovascular: neg Ear/Nose/Throat: neg  Skin: neg Eyes: neg Respiratory: neg Gastroitestinal: neg  Hematology/Lymphatic: neg  Endocrine: neg Musculoskeletal:neg Allergy/Immunology: neg Neurological: neg Psychiatric: neg Sleep : Obstructive sleep apnea with CPAP   ALLERGIES: Allergies  Allergen Reactions  . Combigan [Brimonidine Tartrate-Timolol] Itching    Itching eyes    HOME MEDICATIONS: Outpatient Medications Prior to Visit  Medication Sig Dispense Refill  . aspirin 81 MG tablet Take 81 mg by mouth daily.     Marland Kitchen atorvastatin (LIPITOR) 10 MG tablet Take 10 mg by mouth daily.    . Coenzyme Q10 (CO Q 10 PO)  Take 1 capsule by mouth daily.     . COSOPT PF 22.3-6.8 MG/ML SOLN Place 1 drop into both eyes 2 (two) times daily.     . Cranberry 400 MG CAPS Take 1 capsule by mouth daily.    Marland Kitchen diltiazem (DILACOR XR) 240 MG 24 hr capsule Take 1 capsule (240 mg total) by mouth daily. 90 capsule 3  . fish oil-omega-3 fatty acids 1000 MG capsule Take 1 g by mouth 2 (two) times daily.     Marland Kitchen latanoprost (XALATAN) 0.005 % ophthalmic solution Place 1 drop into both eyes at bedtime.    Marland Kitchen losartan-hydrochlorothiazide (HYZAAR) 100-12.5 MG tablet Take 1 tablet by mouth daily. 90 tablet 3  . Misc Natural Products (PROSTATE HEALTH) CAPS Take 1 capsule by mouth daily.    . multivitamin (THERAGRAN) per tablet Take 1 tablet by mouth daily.    Marland Kitchen oxymetazoline (AFRIN) 0.05 % nasal spray Place 1 spray into  both nostrils daily as needed for congestion.    . Pomegranate 250 MG CAPS Take 1 capsule by mouth daily.    . sodium chloride (OCEAN) 0.65 % SOLN nasal spray Place 2 sprays into both nostrils as needed for congestion. 30 mL 0  . UNABLE TO FIND Med Name: Focus Factor    . valACYclovir (VALTREX) 1000 MG tablet Take 1,000 mg by mouth as directed.     . polyethylene glycol (MIRALAX / GLYCOLAX) packet Take 17 g by mouth every other day.     No facility-administered medications prior to visit.     PAST MEDICAL HISTORY: Past Medical History:  Diagnosis Date  . Allergy   . Atrial fibrillation ([place])   . Bradycardia   . Bradycardia   . Cardiac murmur   . Dizziness   . Glaucoma   . Heart palpitations   . Hyperlipidemia   . Hypertension   . Skin abnormalities    pre cancerous lesion R hand  . Sleep apnea    uses cpap  . Snoring     PAST SURGICAL HISTORY: Past Surgical History:  Procedure Laterality Date  . APPENDECTOMY  1965  . COLONOSCOPY    . POLYPECTOMY    . TONSILLECTOMY     as a child    FAMILY HISTORY: Family History  Problem Relation Age of Onset  . Stroke Father   . Stroke Mother   . Stroke  Sister   . Breast cancer Sister   . High blood pressure Sister   . Diabetes Sister   . Colon cancer Unknown        Uncle  . Colon cancer Paternal Uncle   . Mitral valve prolapse Other        sugery 09-27-2017  . Rectal cancer Neg Hx   . Stomach cancer Neg Hx     SOCIAL HISTORY: Social History   Social History  . Marital status: Married    Spouse name: [name]  . Number of children: 1  . Years of education: Masters   Occupational History  .  Retired   Social History Main Topics  . Smoking status: Former Smoker    Types: Cigarettes    Quit date: 12/18/1965  . Smokeless tobacco: Never Used  . Alcohol use Yes     Comment: 5-7 drinks per week  . Drug use: No  . Sexual activity: Not on file   Other Topics Concern  . Not on file   Social History Narrative   Patient is married [name]).   Patient is retired.   Patient has one adult child.   Patient does not drink any caffeine.   Patient is right-handed.   Patient has a Scientist, water quality.              PHYSICAL EXAM  Vitals:   10/17/17 1253  Weight: 203 lb 3.2 oz (92.2 kg)  Height: 6\' 2"  (1.88 m)   Body mass index is 26.09 kg/m.  Generalized: Well developed, in no acute distress  Head: normocephalic and atraumatic,. Oropharynx benign  Neck: Supple,  Musculoskeletal: No deformity   Neurological examination   Mentation: Alert oriented to time, place, history taking. Attention span and concentration appropriate. Recent and remote memory intact.  Follows all commands speech and language fluent.   Cranial nerve II-XII: .Pupils were equal round reactive to light extraocular movements were full, visual field were full on confrontational test. Facial sensation and strength were normal. hearing was intact to finger rubbing bilaterally.  Uvula tongue midline. head turning and shoulder shrug were normal and symmetric.Tongue protrusion into cheek strength was normal. Motor: normal bulk and tone, full strength in the BUE,  BLE, Sensory: normal and symmetric to light touch,  Coordination: finger-nose-finger, heel-to-shin bilaterally, no dysmetria Reflexes: Symmetric upper and lower, plantar responses were flexor bilaterally. Gait and Station: Rising up from seated position without assistance, normal stance,  moderate stride, good arm swing, smooth turning, able to perform tiptoe, and heel walking without difficulty. Tandem gait is steady  DIAGNOSTIC DATA (LABS, IMAGING, TESTING) - I reviewed patient records, labs, notes, testing and imaging myself where available.  No results found for: WBC, HGB, HCT, MCV, PLT    Component Value Date/Time   PROT 7.3 07/24/2007 1013   ALBUMIN 3.8 07/24/2007 1013   AST 26 07/24/2007 1013   ALT 23 07/24/2007 1013   ALKPHOS 81 07/24/2007 1013   BILITOT 0.9 07/24/2007 1013   Lab Results  Component Value Date   CHOL 182 07/24/2007   HDL 35.1 (L) 07/24/2007   LDLCALC 122 (H) 07/24/2007   TRIG 125 07/24/2007   CHOLHDL 5.2 CALC 07/24/2007    ASSESSMENT AND PLAN  76 y.o. year old male  has a past medical history of Allergy; Atrial fibrillation ([place]); Bradycardia; Bradycardia; Cardiac murmur; Dizziness; Glaucoma; Heart palpitations; Hyperlipidemia; Hypertension; Skin abnormalities; Sleep apnea; and Snoring. here to follow-up for obstructive sleep apnea. CPAP compliance. ESS score today is 4 and fatigue severity scale is 9. CPAP compliance data dated 08/17/2017 - 10/15/2017 shows greater than 4 hours at 85% average usage 6 hours 32 minutes. Pressure 4-10 cm EPR 3. AHI 1.1   CPAP compliance 85 data reviewed with patient  Continue same settings Follow-up yearly and when necessary Dennie Bible, [facility], [facility], APRN  [facility] [address], [place], [place] [address] [phone]

## 2017-10-17 ENCOUNTER — Encounter: Payer: Self-pay | Admitting: Nurse Practitioner

## 2017-10-17 ENCOUNTER — Ambulatory Visit (INDEPENDENT_AMBULATORY_CARE_PROVIDER_SITE_OTHER): Payer: PPO | Admitting: Nurse Practitioner

## 2017-10-17 VITALS — Ht 74.0 in | Wt 203.2 lb

## 2017-10-17 DIAGNOSIS — G4733 Obstructive sleep apnea (adult) (pediatric): Secondary | ICD-10-CM | POA: Diagnosis not present

## 2017-10-17 DIAGNOSIS — Z9989 Dependence on other enabling machines and devices: Secondary | ICD-10-CM

## 2017-10-17 NOTE — Patient Instructions (Addendum)
CPAP compliance 85% Continue same settings Follow-up yearly and when necessary

## 2017-10-18 NOTE — Progress Notes (Signed)
I agree with the assessment and plan as directed by NP .The patient is known to me .   Kemberly Taves, MD  

## 2017-10-30 DIAGNOSIS — H353221 Exudative age-related macular degeneration, left eye, with active choroidal neovascularization: Secondary | ICD-10-CM | POA: Diagnosis not present

## 2017-10-30 DIAGNOSIS — H43813 Vitreous degeneration, bilateral: Secondary | ICD-10-CM | POA: Diagnosis not present

## 2017-10-30 DIAGNOSIS — H353111 Nonexudative age-related macular degeneration, right eye, early dry stage: Secondary | ICD-10-CM | POA: Diagnosis not present

## 2017-10-30 DIAGNOSIS — H2513 Age-related nuclear cataract, bilateral: Secondary | ICD-10-CM | POA: Diagnosis not present

## 2017-11-01 DIAGNOSIS — K219 Gastro-esophageal reflux disease without esophagitis: Secondary | ICD-10-CM | POA: Diagnosis not present

## 2017-11-01 DIAGNOSIS — Z23 Encounter for immunization: Secondary | ICD-10-CM | POA: Diagnosis not present

## 2017-11-12 DIAGNOSIS — H25813 Combined forms of age-related cataract, bilateral: Secondary | ICD-10-CM | POA: Diagnosis not present

## 2017-11-12 DIAGNOSIS — H353111 Nonexudative age-related macular degeneration, right eye, early dry stage: Secondary | ICD-10-CM | POA: Diagnosis not present

## 2017-11-12 DIAGNOSIS — H353221 Exudative age-related macular degeneration, left eye, with active choroidal neovascularization: Secondary | ICD-10-CM | POA: Diagnosis not present

## 2017-11-12 DIAGNOSIS — H401131 Primary open-angle glaucoma, bilateral, mild stage: Secondary | ICD-10-CM | POA: Diagnosis not present

## 2017-11-19 DIAGNOSIS — J069 Acute upper respiratory infection, unspecified: Secondary | ICD-10-CM | POA: Diagnosis not present

## 2017-11-30 DIAGNOSIS — H353221 Exudative age-related macular degeneration, left eye, with active choroidal neovascularization: Secondary | ICD-10-CM | POA: Diagnosis not present

## 2017-11-30 DIAGNOSIS — H43813 Vitreous degeneration, bilateral: Secondary | ICD-10-CM | POA: Diagnosis not present

## 2017-11-30 DIAGNOSIS — H43392 Other vitreous opacities, left eye: Secondary | ICD-10-CM | POA: Diagnosis not present

## 2017-11-30 DIAGNOSIS — H353111 Nonexudative age-related macular degeneration, right eye, early dry stage: Secondary | ICD-10-CM | POA: Diagnosis not present

## 2017-12-27 DIAGNOSIS — J029 Acute pharyngitis, unspecified: Secondary | ICD-10-CM | POA: Diagnosis not present

## 2017-12-27 DIAGNOSIS — R05 Cough: Secondary | ICD-10-CM | POA: Diagnosis not present

## 2018-01-01 DIAGNOSIS — J209 Acute bronchitis, unspecified: Secondary | ICD-10-CM | POA: Diagnosis not present

## 2018-01-21 ENCOUNTER — Encounter: Payer: Self-pay | Admitting: Family Medicine

## 2018-01-21 DIAGNOSIS — R5383 Other fatigue: Secondary | ICD-10-CM | POA: Diagnosis not present

## 2018-01-21 DIAGNOSIS — K219 Gastro-esophageal reflux disease without esophagitis: Secondary | ICD-10-CM | POA: Diagnosis not present

## 2018-01-21 DIAGNOSIS — R634 Abnormal weight loss: Secondary | ICD-10-CM | POA: Diagnosis not present

## 2018-01-23 DIAGNOSIS — R634 Abnormal weight loss: Secondary | ICD-10-CM | POA: Diagnosis not present

## 2018-01-24 ENCOUNTER — Encounter: Payer: Self-pay | Admitting: Family Medicine

## 2018-01-24 DIAGNOSIS — R634 Abnormal weight loss: Secondary | ICD-10-CM | POA: Diagnosis not present

## 2018-01-25 DIAGNOSIS — H43813 Vitreous degeneration, bilateral: Secondary | ICD-10-CM | POA: Diagnosis not present

## 2018-01-25 DIAGNOSIS — H353221 Exudative age-related macular degeneration, left eye, with active choroidal neovascularization: Secondary | ICD-10-CM | POA: Diagnosis not present

## 2018-01-25 DIAGNOSIS — H353111 Nonexudative age-related macular degeneration, right eye, early dry stage: Secondary | ICD-10-CM | POA: Diagnosis not present

## 2018-01-25 DIAGNOSIS — H43392 Other vitreous opacities, left eye: Secondary | ICD-10-CM | POA: Diagnosis not present

## 2018-01-28 ENCOUNTER — Telehealth: Payer: Self-pay | Admitting: Cardiovascular Disease

## 2018-01-28 DIAGNOSIS — F411 Generalized anxiety disorder: Secondary | ICD-10-CM | POA: Diagnosis not present

## 2018-01-28 DIAGNOSIS — K219 Gastro-esophageal reflux disease without esophagitis: Secondary | ICD-10-CM | POA: Diagnosis not present

## 2018-01-28 DIAGNOSIS — R634 Abnormal weight loss: Secondary | ICD-10-CM | POA: Diagnosis not present

## 2018-01-28 DIAGNOSIS — G4700 Insomnia, unspecified: Secondary | ICD-10-CM | POA: Diagnosis not present

## 2018-01-28 NOTE — Telephone Encounter (Signed)
New message    Pt c/o BP issue: STAT if pt c/o blurred vision, one-sided weakness or slurred speech  1. What are your last 5 BP readings? Yesterday 185/84 , today 170/87   2. Are you having any other symptoms (ex. Dizziness, headache, blurred vision, passed out)?  Heart muscle hurting that was unusual   3. What is your BP issue? Discuss with nurse.

## 2018-01-28 NOTE — Telephone Encounter (Signed)
Patient stated his BP has been elevated for a couple of weeks and yesterday he felt some pain in his heart muscle. Patient stated he saw his PCP a few weeks ago and they told him to follow up with his cardiologist. Patient stated he call an on call doctor last night and they told him to follow up with his cardiologist.  Made an appointment with Soyla Dryer to be evaluated. Encouraged patient to go to ED if chest pain came back. Patient verbalized understanding.

## 2018-01-29 ENCOUNTER — Encounter: Payer: Self-pay | Admitting: Physician Assistant

## 2018-01-29 ENCOUNTER — Ambulatory Visit: Payer: PPO | Admitting: Physician Assistant

## 2018-01-29 VITALS — BP 150/84 | HR 63 | Ht 73.0 in | Wt 190.2 lb

## 2018-01-29 DIAGNOSIS — R079 Chest pain, unspecified: Secondary | ICD-10-CM | POA: Insufficient documentation

## 2018-01-29 DIAGNOSIS — I10 Essential (primary) hypertension: Secondary | ICD-10-CM | POA: Diagnosis not present

## 2018-01-29 DIAGNOSIS — E785 Hyperlipidemia, unspecified: Secondary | ICD-10-CM

## 2018-01-29 DIAGNOSIS — R002 Palpitations: Secondary | ICD-10-CM

## 2018-01-29 MED ORDER — LOSARTAN POTASSIUM-HCTZ 100-25 MG PO TABS: 1.0000 | ORAL_TABLET | Freq: Every day | ORAL | 3 refills | Status: DC

## 2018-01-29 MED ORDER — DILTIAZEM HCL ER 240 MG PO CP24: 240.0000 mg | ORAL_CAPSULE | Freq: Every day | ORAL | 3 refills | Status: DC

## 2018-01-29 NOTE — Progress Notes (Signed)
Cardiology Office Note    Date:  01/29/2018   ID:  Jonathan Lane, DOB 02-20-1941, MRN [id number]  PCP:  Darcus Austin, MD  Cardiologist: [name], MD  Chief Complaint  Patient presents with  . Hypertension    History of Present Illness:  Jonathan Lane is a 77 y.o. male 3 of labile hypertension felt secondary to alcohol in the past(although patient denies), dizziness, bradycardia not symptomatic but avoid beta-blockers and consider ETT for chronotropic response in the future if he becomes symptomatic.  Chart also indicates he has had atrial fibrillation in the past but I cannot find documentation of this and he only notes that he has had PVCs.  He has been maintained on diltiazem.  Patient has felt like his BP has been up for a month. Under a lot of stress with his bipolar daughter. Wife just had TAVR.  Flu in the house and eating a lot of food from outside-soups, deli meats and processed foods.Sunday while shopping felt an ache in his left chest-lasted ~1hr. He went home and BP 185/87.  He came down so he did not go to the ER.  Saw primary care yest BP 140/70, treated for GERD and just got back from cruise so tested negative for parasites.  Has had about 10 pound weight loss.  Past Medical History:  Diagnosis Date  . Allergy   . Atrial fibrillation ([place])   . Bradycardia   . Bradycardia   . Cardiac murmur   . Dizziness   . Glaucoma   . Heart palpitations   . Hyperlipidemia   . Hypertension   . Skin abnormalities    pre cancerous lesion R hand  . Sleep apnea    uses cpap  . Snoring     Past Surgical History:  Procedure Laterality Date  . APPENDECTOMY  1965  . COLONOSCOPY    . POLYPECTOMY    . TONSILLECTOMY     as a child    Current Medications: Current Meds  Medication Sig  . Apoaequorin (PREVAGEN PO) Take 1 capsule by mouth as directed. Focus Health and memory.  Marland Kitchen aspirin 81 MG tablet Take 81 mg by mouth daily.   Marland Kitchen atorvastatin (LIPITOR) 10 MG tablet  Take 10 mg by mouth daily.  . Coenzyme Q10 (CO Q 10 PO) Take 1 capsule by mouth daily.   . COSOPT PF 22.3-6.8 MG/ML SOLN Place 1 drop into both eyes 2 (two) times daily.   . Cranberry 400 MG CAPS Take 1 capsule by mouth daily.  Marland Kitchen diltiazem (DILACOR XR) 240 MG 24 hr capsule Take 1 capsule (240 mg total) by mouth daily.  . fish oil-omega-3 fatty acids 1000 MG capsule Take 1 g by mouth 2 (two) times daily.   Marland Kitchen latanoprost (XALATAN) 0.005 % ophthalmic solution Place 1 drop into both eyes at bedtime.  . Misc Natural Products (PROSTATE HEALTH) CAPS Take 1 capsule by mouth daily.  . multivitamin (THERAGRAN) per tablet Take 1 tablet by mouth daily.  Marland Kitchen oxymetazoline (AFRIN) 0.05 % nasal spray Place 1 spray into both nostrils daily as needed for congestion.  . Pomegranate 250 MG CAPS Take 1 capsule by mouth daily.  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  . Probiotic Product (ALIGN PO) Take by mouth. Take daily  . RHOPRESSA 0.02 % SOLN as directed.  . sodium chloride (OCEAN) 0.65 % SOLN nasal spray Place 2 sprays into both nostrils as needed for  congestion.  . valACYclovir (VALTREX) 1000 MG tablet Take 1,000 mg by mouth as directed.   . [DISCONTINUED] diltiazem (DILACOR XR) 240 MG 24 hr capsule Take 1 capsule (240 mg total) by mouth daily.  . [DISCONTINUED] losartan-hydrochlorothiazide (HYZAAR) 100-12.5 MG tablet Take 1 tablet by mouth daily.     Allergies:   Combigan [brimonidine tartrate-timolol]   Social History   Socioeconomic History  . Marital status: Married    Spouse name: [name]  . Number of children: 1  . Years of education: Masters  . Highest education level: None  Social Needs  . Financial resource strain: None  . Food insecurity - worry: None  . Food insecurity - inability: None  . Transportation needs - medical: None  . Transportation needs - non-medical: None  Occupational History    Employer: RETIRED  Tobacco Use  .  Smoking status: Former Smoker    Types: Cigarettes    Last attempt to quit: 12/18/1965    Years since quitting: 52.1  . Smokeless tobacco: Never Used  Substance and Sexual Activity  . Alcohol use: Yes    Comment: 5-7 drinks per week  . Drug use: No  . Sexual activity: None  Other Topics Concern  . None  Social History Narrative   Patient is married Charity fundraiser).   Patient is retired.   Patient has one adult child.   Patient does not drink any caffeine.   Patient is right-handed.   Patient has a Scientist, water quality.              Family History:  The patient's family history includes Breast cancer in his sister; Colon cancer in his paternal uncle and unknown relative; Diabetes in his sister; High blood pressure in his sister; Mitral valve prolapse in his other; Stroke in his father, mother, and sister.   ROS:   Please see the history of present illness.    Review of Systems  Constitution: Positive for weight loss.  Cardiovascular: Positive for chest pain.  Respiratory: Positive for cough.    All other systems reviewed and are negative.   PHYSICAL EXAM:   VS:  BP (!) 150/84   Pulse 63   Ht 6\' 1"  (1.854 m)   Wt 190 lb 4 oz (86.3 kg)   SpO2 97%   BMI 25.10 kg/m   Physical Exam  GEN: Well nourished, well developed, in no acute distress  Neck: no JVD, carotid bruits, or masses Cardiac:RRR; 1/6 systolic murmur at the left sternal border Respiratory:  clear to auscultation bilaterally, normal work of breathing GI: soft, nontender, nondistended, + BS Ext: without cyanosis, clubbing, or edema, Good distal pulses bilaterally Neuro:  Alert and Oriented x 3 Psych: euthymic mood, full affect  Wt Readings from Last 3 Encounters:  01/29/18 190 lb 4 oz (86.3 kg)  10/17/17 203 lb 3.2 oz (92.2 kg)  02/15/17 203 lb 12.8 oz (92.4 kg)      Studies/Labs Reviewed:   EKG:  EKG is  ordered today.  The ekg ordered today demonstrates sinus bradycardia with nonspecific ST-T wave changes, similar  to prior EKGs.  No acute change  Recent Labs: No results found for requested labs within last 8760 hours.   Lipid Panel    Component Value Date/Time   CHOL 182 07/24/2007 1013   TRIG 125 07/24/2007 1013   HDL 35.1 (L) 07/24/2007 1013   CHOLHDL 5.2 CALC 07/24/2007 1013   VLDL 25 07/24/2007 1013   LDLCALC 122 (H) 07/24/2007 1013  Additional studies/ records that were reviewed today include:     ASSESSMENT:    1. Essential hypertension   2. Chest pain, unspecified type   3. Elevated lipids   4. Palpitations      PLAN:  In order of problems listed above:  Essential hypertension has been elevated recently suspect a combination of stress, excessive sodium intake.  Will increase Hyzaar to 100/25 mg daily.  He does have blood work at primary care yesterday so we will not repeat this.  Chest pain described as an ache while shopping on Sunday lasted about 1 hour and blood pressure was elevated.  With cardiac risk factors of hypertension and HLD would recommend nuclear stress test.  Follow-up with Dr. Johnsie Cancel  Hyperlipidemia on Lipitor  History of palpitations has been on diltiazem for many years.  There is a question of atrial fibrillation as it is on his problem list but I could not find that documented in the charts and patient is unaware of this diagnosis    Medication Adjustments/Labs and Tests Ordered: Current medicines are reviewed at length with the patient today.  Concerns regarding medicines are outlined above.  Medication changes, Labs and Tests ordered today are listed in the Patient Instructions below. Patient Instructions  Medication Instructions:  1) INCREASE HYZAAR to 100-25mg  daily  Labwork: None  Testing/Procedures: Selinda Eon recommends you have a NUCLEAR STRESS TEST.  Follow-Up: Your provider recommends that you schedule a follow-up appointment with Dr. Johnsie Cancel in 3 weeks.   Any Other Special Instructions Will Be Listed Below (If Applicable).     If  you need a refill on your cardiac medications before your next appointment, please call your pharmacy.      Sumner Boast, PA-C  01/29/2018 10:42 AM    [facility] [address], [place], [place]  [address] Phone: [phone]; Fax: [phone]

## 2018-01-29 NOTE — Patient Instructions (Signed)
Medication Instructions:  1) INCREASE HYZAAR to 100-25mg  daily  Labwork: None  Testing/Procedures: Selinda Eon recommends you have a NUCLEAR STRESS TEST.  Follow-Up: Your provider recommends that you schedule a follow-up appointment with Dr. Johnsie Cancel in 3 weeks.   Any Other Special Instructions Will Be Listed Below (If Applicable).     If you need a refill on your cardiac medications before your next appointment, please call your pharmacy.

## 2018-02-01 DIAGNOSIS — H401131 Primary open-angle glaucoma, bilateral, mild stage: Secondary | ICD-10-CM | POA: Diagnosis not present

## 2018-02-01 DIAGNOSIS — H25813 Combined forms of age-related cataract, bilateral: Secondary | ICD-10-CM | POA: Diagnosis not present

## 2018-02-01 DIAGNOSIS — H353221 Exudative age-related macular degeneration, left eye, with active choroidal neovascularization: Secondary | ICD-10-CM | POA: Diagnosis not present

## 2018-02-01 DIAGNOSIS — H353111 Nonexudative age-related macular degeneration, right eye, early dry stage: Secondary | ICD-10-CM | POA: Diagnosis not present

## 2018-02-19 ENCOUNTER — Encounter (HOSPITAL_COMMUNITY): Payer: PPO

## 2018-02-19 DIAGNOSIS — G4733 Obstructive sleep apnea (adult) (pediatric): Secondary | ICD-10-CM | POA: Diagnosis not present

## 2018-02-19 DIAGNOSIS — M2610 Unspecified anomaly of jaw-cranial base relationship: Secondary | ICD-10-CM | POA: Diagnosis not present

## 2018-02-25 DIAGNOSIS — R61 Generalized hyperhidrosis: Secondary | ICD-10-CM | POA: Diagnosis not present

## 2018-02-25 DIAGNOSIS — R1031 Right lower quadrant pain: Secondary | ICD-10-CM | POA: Diagnosis not present

## 2018-02-25 DIAGNOSIS — K219 Gastro-esophageal reflux disease without esophagitis: Secondary | ICD-10-CM | POA: Diagnosis not present

## 2018-02-26 ENCOUNTER — Other Ambulatory Visit: Payer: Self-pay | Admitting: Family Medicine

## 2018-02-26 ENCOUNTER — Ambulatory Visit
Admission: RE | Admit: 2018-02-26 | Discharge: 2018-02-26 | Disposition: A | Discharge status: Home / Self Care | Payer: PPO | Source: Ambulatory Visit | Attending: Family Medicine | Admitting: Family Medicine

## 2018-02-26 DIAGNOSIS — R61 Generalized hyperhidrosis: Secondary | ICD-10-CM

## 2018-02-28 DIAGNOSIS — Z85828 Personal history of other malignant neoplasm of skin: Secondary | ICD-10-CM | POA: Diagnosis not present

## 2018-02-28 DIAGNOSIS — L821 Other seborrheic keratosis: Secondary | ICD-10-CM | POA: Diagnosis not present

## 2018-02-28 DIAGNOSIS — D0471 Carcinoma in situ of skin of right lower limb, including hip: Secondary | ICD-10-CM | POA: Diagnosis not present

## 2018-02-28 DIAGNOSIS — Z86018 Personal history of other benign neoplasm: Secondary | ICD-10-CM | POA: Diagnosis not present

## 2018-02-28 DIAGNOSIS — D485 Neoplasm of uncertain behavior of skin: Secondary | ICD-10-CM | POA: Diagnosis not present

## 2018-02-28 DIAGNOSIS — D224 Melanocytic nevi of scalp and neck: Secondary | ICD-10-CM | POA: Diagnosis not present

## 2018-02-28 DIAGNOSIS — L309 Dermatitis, unspecified: Secondary | ICD-10-CM | POA: Diagnosis not present

## 2018-02-28 DIAGNOSIS — D225 Melanocytic nevi of trunk: Secondary | ICD-10-CM | POA: Diagnosis not present

## 2018-02-28 DIAGNOSIS — D2262 Melanocytic nevi of left upper limb, including shoulder: Secondary | ICD-10-CM | POA: Diagnosis not present

## 2018-02-28 DIAGNOSIS — Z23 Encounter for immunization: Secondary | ICD-10-CM | POA: Diagnosis not present

## 2018-03-01 DIAGNOSIS — H353111 Nonexudative age-related macular degeneration, right eye, early dry stage: Secondary | ICD-10-CM | POA: Diagnosis not present

## 2018-03-01 DIAGNOSIS — H353221 Exudative age-related macular degeneration, left eye, with active choroidal neovascularization: Secondary | ICD-10-CM | POA: Diagnosis not present

## 2018-03-01 DIAGNOSIS — H25813 Combined forms of age-related cataract, bilateral: Secondary | ICD-10-CM | POA: Diagnosis not present

## 2018-03-01 DIAGNOSIS — H401131 Primary open-angle glaucoma, bilateral, mild stage: Secondary | ICD-10-CM | POA: Diagnosis not present

## 2018-03-04 ENCOUNTER — Ambulatory Visit: Payer: PPO | Admitting: Cardiovascular Disease

## 2018-03-15 ENCOUNTER — Encounter: Payer: Self-pay | Admitting: Gastroenterology

## 2018-03-15 ENCOUNTER — Ambulatory Visit: Payer: PPO | Admitting: Gastroenterology

## 2018-03-15 VITALS — BP 138/80 | HR 66 | Ht 74.0 in | Wt 196.4 lb

## 2018-03-15 DIAGNOSIS — R1031 Right lower quadrant pain: Secondary | ICD-10-CM | POA: Insufficient documentation

## 2018-03-15 NOTE — Progress Notes (Signed)
03/15/2018 Jonathan Lane [id number] 1941/04/14   HISTORY OF PRESENT ILLNESS: This is a very pleasant 77 year old male who was previously known to Dr. Deatra Ina.  His last colonoscopy was in December 2015 at which time he was found only mild diverticulosis in the sigmoid colon.  He tells me that he's had issues with intermittent right lower quadrant abdominal pains on and off for 40 years.  He tells me that Dr. Lyla Son had told him previously that he did not know what was causing this pain.  Anyway, he presents to our office today at the request of his PCP, Dr. Inda Merlin, with complaints of an episode of right lower quadrant abdominal pain.  He tells me this happened about 3 or 4 weeks ago when it woke him from sleep in the middle the night.  The area stayed sore, but the acute pain had resolved.  At this point he is no longer having any abdominal pain and cannot reproduce the pain upon pressing on his abdomen.  He denies any issues with moving his bowels.  He also reports night sweats, but his PCP has been working with him in regards to evaluation of those.  His CBC was normal.  Sed rate was slightly elevated at 25.  CMP unremarkable.  TSH normal.  Past Medical History:  Diagnosis Date  . Allergy   . Atrial fibrillation ([place])   . Bradycardia   . Bradycardia   . Cardiac murmur   . Dizziness   . Glaucoma   . Heart palpitations   . Hyperlipidemia   . Hypertension   . Skin abnormalities    pre cancerous lesion R hand  . Sleep apnea    uses cpap  . Snoring    Past Surgical History:  Procedure Laterality Date  . APPENDECTOMY  1965  . COLONOSCOPY    . POLYPECTOMY    . TONSILLECTOMY     as a child    reports that he quit smoking about 52 years ago. His smoking use included cigarettes. He has never used smokeless tobacco. He reports that he drinks alcohol. He reports that he does not use drugs. family history includes Breast cancer in his sister; Colon cancer in his paternal uncle  and unknown relative; Diabetes in his sister; High blood pressure in his sister; Mitral valve prolapse in his other; Stroke in his father, mother, and sister. Allergies  Allergen Reactions  . Combigan [Brimonidine Tartrate-Timolol] Itching    Itching eyes      Outpatient Encounter Medications as of 03/15/2018  Medication Sig  . Apoaequorin (PREVAGEN PO) Take 1 capsule by mouth as directed. Focus [employer] and memory.  Marland Kitchen aspirin 81 MG tablet Take 81 mg by mouth daily.   Marland Kitchen atorvastatin (LIPITOR) 10 MG tablet Take 10 mg by mouth daily.  . Coenzyme Q10 (CO Q 10 PO) Take 1 capsule by mouth daily.   . COSOPT PF 22.3-6.8 MG/ML SOLN Place 1 drop into both eyes 2 (two) times daily.   . Cranberry 400 MG CAPS Take 1 capsule by mouth daily.  Marland Kitchen diltiazem (DILACOR XR) 240 MG 24 hr capsule Take 1 capsule (240 mg total) by mouth daily.  . fish oil-omega-3 fatty acids 1000 MG capsule Take 1 g by mouth 2 (two) times daily.   Marland Kitchen latanoprost (XALATAN) 0.005 % ophthalmic solution Place 1 drop into both eyes at bedtime.  Marland Kitchen losartan-hydrochlorothiazide (HYZAAR) 100-25 MG tablet Take 1 tablet by mouth daily.  . Misc Natural Products (  PROSTATE HEALTH) CAPS Take 1 capsule by mouth daily.  . multivitamin (THERAGRAN) per tablet Take 1 tablet by mouth daily.  Marland Kitchen oxymetazoline (AFRIN) 0.05 % nasal spray Place 1 spray into both nostrils daily as needed for congestion.  . Pomegranate 250 MG CAPS Take 1 capsule by mouth daily.  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  . Probiotic Product (ALIGN PO) Take by mouth. Take daily  . RHOPRESSA 0.02 % SOLN as directed.  . valACYclovir (VALTREX) 1000 MG tablet Take 1,000 mg by mouth as directed.   . [DISCONTINUED] sodium chloride (OCEAN) 0.65 % SOLN nasal spray Place 2 sprays into both nostrils as needed for congestion. (Patient not taking: Reported on 03/15/2018)   No facility-administered encounter medications on file as of  03/15/2018.      REVIEW OF SYSTEMS  : All other systems reviewed and negative except where noted in the History of Present Illness.   PHYSICAL EXAM: BP 138/80   Pulse 66   Ht 6\' 2"  (1.88 m)   Wt 196 lb 6.4 oz (89.1 kg)   SpO2 97%   BMI 25.22 kg/m  General: Well developed white male in no acute distress Head: Normocephalic and atraumatic Eyes:  Sclerae anicteric, conjunctiva pink. Ears: Normal auditory acuity Lungs: Clear throughout to auscultation; no increased WOB. Heart: Regular rate and rhythm; no M/R/G. Abdomen: Soft, non-distended.  BS present.  Non-tender. Musculoskeletal: Symmetrical with no gross deformities  Skin: No lesions on visible extremities Extremities: No edema  Neurological: Alert oriented x 4, grossly non-focal Psychological:  Alert and cooperative. Normal mood and affect  ASSESSMENT AND PLAN: *RLQ abdominal pain: Has had issues with chronic intermittent right lower quadrant abdominal pains for the past 40 years with no cause determined.  This specific episode woke him from sleep at night, but at this juncture he is no longer having any pain.  Advised him that if the pain returns then to call our office and we would order a CT scan of the abdomen and pelvis with contrast.  He is in agreement with the plan.    CC:  Darcus Austin, MD

## 2018-03-15 NOTE — Progress Notes (Signed)
Reviewed and agree with initial management plan.  Jobani Sabado T. Trulee Hamstra, MD FACG 

## 2018-03-21 ENCOUNTER — Telehealth (HOSPITAL_COMMUNITY): Payer: Self-pay | Admitting: *Deleted

## 2018-03-21 DIAGNOSIS — H25813 Combined forms of age-related cataract, bilateral: Secondary | ICD-10-CM | POA: Diagnosis not present

## 2018-03-21 DIAGNOSIS — H401131 Primary open-angle glaucoma, bilateral, mild stage: Secondary | ICD-10-CM | POA: Diagnosis not present

## 2018-03-21 DIAGNOSIS — H353111 Nonexudative age-related macular degeneration, right eye, early dry stage: Secondary | ICD-10-CM | POA: Diagnosis not present

## 2018-03-21 DIAGNOSIS — H353221 Exudative age-related macular degeneration, left eye, with active choroidal neovascularization: Secondary | ICD-10-CM | POA: Diagnosis not present

## 2018-03-21 NOTE — Telephone Encounter (Signed)
Left message on voicemail per DPR in reference to upcoming appointment scheduled on 03/26/18 with detailed instructions given per Myocardial Perfusion Study Information Sheet for the test. LM to arrive 15 minutes early, and that it is imperative to arrive on time for appointment to keep from having the test rescheduled. If you need to cancel or reschedule your appointment, please call the office within 24 hours of your appointment. Failure to do so may result in a cancellation of your appointment, and a $50 no show fee. Phone number given for call back for any questions. Kirstie Peri

## 2018-03-26 ENCOUNTER — Ambulatory Visit (HOSPITAL_COMMUNITY): Payer: PPO | Attending: Cardiology

## 2018-03-26 DIAGNOSIS — I10 Essential (primary) hypertension: Secondary | ICD-10-CM | POA: Diagnosis not present

## 2018-03-26 DIAGNOSIS — E782 Mixed hyperlipidemia: Secondary | ICD-10-CM | POA: Insufficient documentation

## 2018-03-26 DIAGNOSIS — I4891 Unspecified atrial fibrillation: Secondary | ICD-10-CM | POA: Insufficient documentation

## 2018-03-26 DIAGNOSIS — R079 Chest pain, unspecified: Secondary | ICD-10-CM | POA: Insufficient documentation

## 2018-03-26 LAB — MYOCARDIAL PERFUSION IMAGING
CHL CUP NUCLEAR SDS: 0
CSEPHR: 97 %
Estimated workload: 13.3 METS
Exercise duration (min): 11 min
Exercise duration (sec): 0 s
LVDIAVOL: 89 mL (ref 62–150)
LVSYSVOL: 26 mL
MPHR: 143 {beats}/min
NUC STRESS TID: 0.79
Peak HR: 139 {beats}/min
RATE: 0.28
Rest HR: 59 {beats}/min
SRS: 5
SSS: 5

## 2018-03-26 MED ORDER — TECHNETIUM TC 99M TETROFOSMIN IV KIT
10.8000 | PACK | Freq: Once | INTRAVENOUS | Status: AC | PRN
  Administered 2018-03-26: 10.8 via INTRAVENOUS
  Filled 2018-03-26: qty 11

## 2018-03-26 MED ORDER — TECHNETIUM TC 99M TETROFOSMIN IV KIT
32.1000 | PACK | Freq: Once | INTRAVENOUS | Status: AC | PRN
  Administered 2018-03-26: 32.1 via INTRAVENOUS
  Filled 2018-03-26: qty 33

## 2018-03-26 NOTE — Progress Notes (Signed)
Cardiology Office Note    Date:  04/03/2018   ID:  Jonathan Lane, DOB 05-Apr-1941, MRN [id number]  PCP:  Darcus Austin, MD  Cardiologist: [name], MD  No chief complaint on file.   History of Present Illness:   77 y.o. HTN, ETOH Use , bradycardia avoids beta blocker. On cardizem for palpitations long standing no documentation of PAF. Seen  By PA for elevated BP February and Hyzaar increased. Family stress with Bipolar daughter and wife having TAVR procedure. High salt Diet. Also complained of chest pain F/U myovue reviewed from 03/26/18 normal no ischemia EF 71%  I think he had a lot of anxiety and stress during winter with rental property and wife's health. Larena Glassman also patient of mine He lost 15 lbs and had no appetite On Nexium now    Past Medical History:  Diagnosis Date  . Allergy   . Atrial fibrillation ([place])   . Bradycardia   . Bradycardia   . Cardiac murmur   . Dizziness   . Glaucoma   . Heart palpitations   . Hyperlipidemia   . Hypertension   . Skin abnormalities    pre cancerous lesion R hand  . Sleep apnea    uses cpap  . Snoring     Past Surgical History:  Procedure Laterality Date  . APPENDECTOMY  1965  . COLONOSCOPY    . POLYPECTOMY    . TONSILLECTOMY     as a child    Current Medications: Current Meds  Medication Sig  . Apoaequorin (PREVAGEN PO) Take 1 capsule by mouth as directed. [employer] and memory.  Marland Kitchen aspirin 81 MG tablet Take 81 mg by mouth daily.   Marland Kitchen atorvastatin (LIPITOR) 10 MG tablet Take 10 mg by mouth daily.  . Coenzyme Q10 (CO Q 10 PO) Take 1 capsule by mouth daily.   . COSOPT PF 22.3-6.8 MG/ML SOLN Place 1 drop into both eyes 2 (two) times daily.   . Cranberry 400 MG CAPS Take 1 capsule by mouth daily.  Marland Kitchen diltiazem (DILACOR XR) 240 MG 24 hr capsule Take 1 capsule (240 mg total) by mouth daily.  . fish oil-omega-3 fatty acids 1000 MG capsule Take 1 g by mouth 2 (two) times daily.   Marland Kitchen latanoprost (XALATAN) 0.005 %  ophthalmic solution Place 1 drop into both eyes at bedtime.  Marland Kitchen losartan-hydrochlorothiazide (HYZAAR) 100-25 MG tablet Take 1 tablet by mouth daily.  . Misc Natural Products (PROSTATE HEALTH) CAPS Take 1 capsule by mouth daily.  . multivitamin (THERAGRAN) per tablet Take 1 tablet by mouth daily.  Marland Kitchen oxymetazoline (AFRIN) 0.05 % nasal spray Place 1 spray into both nostrils daily as needed for congestion.  . Pomegranate 250 MG CAPS Take 1 capsule by mouth daily.  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  . Probiotic Product (ALIGN PO) Take by mouth. Take daily  . RHOPRESSA 0.02 % SOLN as directed.  . valACYclovir (VALTREX) 1000 MG tablet Take 1,000 mg by mouth as directed.   . [DISCONTINUED] diltiazem (DILACOR XR) 240 MG 24 hr capsule Take 1 capsule (240 mg total) by mouth daily.  . [DISCONTINUED] losartan-hydrochlorothiazide (HYZAAR) 100-25 MG tablet Take 1 tablet by mouth daily.     Allergies:   Combigan [brimonidine tartrate-timolol]   Social History   Socioeconomic History  . Marital status: Married    Spouse name: [name]  . Number of children: 1  . Years of education: Masters  .  Highest education level: Not on file  Occupational History    Employer: RETIRED  Social Needs  . Financial resource strain: Not on file  . Food insecurity:    Worry: Not on file    Inability: Not on file  . Transportation needs:    Medical: Not on file    Non-medical: Not on file  Tobacco Use  . Smoking status: Former Smoker    Types: Cigarettes    Last attempt to quit: 12/18/1965    Years since quitting: 52.3  . Smokeless tobacco: Never Used  Substance and Sexual Activity  . Alcohol use: Yes    Comment: 5-7 drinks per week  . Drug use: No  . Sexual activity: Not on file  Lifestyle  . Physical activity:    Days per week: Not on file    Minutes per session: Not on file  . Stress: Not on file  Relationships  . Social connections:    Talks  on phone: Not on file    Gets together: Not on file    Attends religious service: Not on file    Active member of club or organization: Not on file    Attends meetings of clubs or organizations: Not on file    Relationship status: Not on file  Other Topics Concern  . Not on file  Social History Narrative   Patient is married [name]).   Patient is retired.   Patient has one adult child.   Patient does not drink any caffeine.   Patient is right-handed.   Patient has a Scientist, water quality.              Family History:  The patient's family history includes Breast cancer in his sister; Colon cancer in his paternal uncle and unknown relative; Diabetes in his sister; High blood pressure in his sister; Mitral valve prolapse in his other; Stroke in his father, mother, and sister.   ROS:   Please see the history of present illness.    Review of Systems  Constitution: Positive for weight loss.  Cardiovascular: Positive for chest pain.  Respiratory: Positive for cough.    All other systems reviewed and are negative.   PHYSICAL EXAM:   VS:  BP 120/68   Pulse (!) 58   Ht 6\' 1"  (1.854 m)   Wt 197 lb (89.4 kg)   SpO2 96%   BMI 25.99 kg/m   Affect appropriate Healthy:  appears stated age 77: normal Neck supple with no adenopathy JVP normal no bruits no thyromegaly Lungs clear with no wheezing and good diaphragmatic motion Heart:  S1/S2 no murmur, no rub, gallop or click PMI normal Abdomen: benighn, BS positve, no tenderness, no AAA no bruit.  No HSM or HJR Distal pulses intact with no bruits No edema Neuro non-focal Skin warm and dry No muscular weakness   Wt Readings from Last 3 Encounters:  04/03/18 197 lb (89.4 kg)  03/26/18 190 lb (86.2 kg)  03/15/18 196 lb 6.4 oz (89.1 kg)      Studies/Labs Reviewed:   EKG:  EKG is  ordered today.  The ekg ordered today demonstrates sinus bradycardia with nonspecific ST-T wave changes, similar to prior EKGs.  No acute  change  Recent Labs: No results found for requested labs within last 8760 hours.   Lipid Panel    Component Value Date/Time   CHOL 182 07/24/2007 1013   TRIG 125 07/24/2007 1013   HDL 35.1 (L) 07/24/2007 1013   CHOLHDL  5.2 CALC 07/24/2007 1013   VLDL 25 07/24/2007 1013   LDLCALC 122 (H) 07/24/2007 1013    Additional studies/ records that were reviewed today include:     ASSESSMENT:    1. Palpitations   2. Essential hypertension   3. Elevated lipids      PLAN:  In order of problems listed above:  HTN:  Improved on higher dose Hyzaar continue lifestyle modifications, weight loss and low sodium diet   Chest pain  Reviewed myovue from 03/26/18  Normal no ischemia EF 71% observe   HLD:  On statin labs with primary   Palpitations: benign sounding no need for event monitor at this time No beta blocker due to bradycardia PMH indicates PAF But no documentation of this   GERD:  On nexium improved weight loss appears to be from stress not "cancer" or other serious illness   [name]

## 2018-03-27 ENCOUNTER — Encounter: Payer: Self-pay | Admitting: Cardiovascular Disease

## 2018-04-02 DIAGNOSIS — H353221 Exudative age-related macular degeneration, left eye, with active choroidal neovascularization: Secondary | ICD-10-CM | POA: Diagnosis not present

## 2018-04-02 DIAGNOSIS — H353111 Nonexudative age-related macular degeneration, right eye, early dry stage: Secondary | ICD-10-CM | POA: Diagnosis not present

## 2018-04-02 DIAGNOSIS — H43813 Vitreous degeneration, bilateral: Secondary | ICD-10-CM | POA: Diagnosis not present

## 2018-04-02 DIAGNOSIS — H43392 Other vitreous opacities, left eye: Secondary | ICD-10-CM | POA: Diagnosis not present

## 2018-04-03 ENCOUNTER — Ambulatory Visit: Payer: PPO | Admitting: Cardiovascular Disease

## 2018-04-03 ENCOUNTER — Encounter: Payer: Self-pay | Admitting: Cardiovascular Disease

## 2018-04-03 VITALS — BP 120/68 | HR 58 | Ht 73.0 in | Wt 197.0 lb

## 2018-04-03 DIAGNOSIS — E785 Hyperlipidemia, unspecified: Secondary | ICD-10-CM

## 2018-04-03 DIAGNOSIS — I10 Essential (primary) hypertension: Secondary | ICD-10-CM | POA: Diagnosis not present

## 2018-04-03 DIAGNOSIS — R002 Palpitations: Secondary | ICD-10-CM

## 2018-04-03 MED ORDER — LOSARTAN POTASSIUM-HCTZ 100-25 MG PO TABS: 1.0000 | ORAL_TABLET | Freq: Every day | ORAL | 3 refills | Status: DC

## 2018-04-03 MED ORDER — DILTIAZEM HCL ER 240 MG PO CP24: 240.0000 mg | ORAL_CAPSULE | Freq: Every day | ORAL | 3 refills | Status: DC

## 2018-04-03 NOTE — Patient Instructions (Addendum)

## 2018-04-04 DIAGNOSIS — N401 Enlarged prostate with lower urinary tract symptoms: Secondary | ICD-10-CM | POA: Diagnosis not present

## 2018-04-04 DIAGNOSIS — R972 Elevated prostate specific antigen [PSA]: Secondary | ICD-10-CM | POA: Diagnosis not present

## 2018-04-04 DIAGNOSIS — D0471 Carcinoma in situ of skin of right lower limb, including hip: Secondary | ICD-10-CM | POA: Diagnosis not present

## 2018-04-04 DIAGNOSIS — R351 Nocturia: Secondary | ICD-10-CM | POA: Diagnosis not present

## 2018-04-04 DIAGNOSIS — N5201 Erectile dysfunction due to arterial insufficiency: Secondary | ICD-10-CM | POA: Diagnosis not present

## 2018-04-08 DIAGNOSIS — E782 Mixed hyperlipidemia: Secondary | ICD-10-CM | POA: Diagnosis not present

## 2018-04-08 DIAGNOSIS — I10 Essential (primary) hypertension: Secondary | ICD-10-CM | POA: Diagnosis not present

## 2018-04-08 DIAGNOSIS — F411 Generalized anxiety disorder: Secondary | ICD-10-CM | POA: Diagnosis not present

## 2018-04-22 DIAGNOSIS — H524 Presbyopia: Secondary | ICD-10-CM | POA: Diagnosis not present

## 2018-04-22 DIAGNOSIS — H353112 Nonexudative age-related macular degeneration, right eye, intermediate dry stage: Secondary | ICD-10-CM | POA: Diagnosis not present

## 2018-04-22 DIAGNOSIS — H401132 Primary open-angle glaucoma, bilateral, moderate stage: Secondary | ICD-10-CM | POA: Diagnosis not present

## 2018-04-22 DIAGNOSIS — H353222 Exudative age-related macular degeneration, left eye, with inactive choroidal neovascularization: Secondary | ICD-10-CM | POA: Diagnosis not present

## 2018-04-23 DIAGNOSIS — H353111 Nonexudative age-related macular degeneration, right eye, early dry stage: Secondary | ICD-10-CM | POA: Diagnosis not present

## 2018-04-23 DIAGNOSIS — H401131 Primary open-angle glaucoma, bilateral, mild stage: Secondary | ICD-10-CM | POA: Diagnosis not present

## 2018-04-23 DIAGNOSIS — H353221 Exudative age-related macular degeneration, left eye, with active choroidal neovascularization: Secondary | ICD-10-CM | POA: Diagnosis not present

## 2018-04-23 DIAGNOSIS — H25813 Combined forms of age-related cataract, bilateral: Secondary | ICD-10-CM | POA: Diagnosis not present

## 2018-05-06 ENCOUNTER — Ambulatory Visit: Payer: PPO | Admitting: Cardiovascular Disease

## 2018-05-22 ENCOUNTER — Encounter: Payer: Self-pay | Admitting: Gastroenterology

## 2018-05-22 ENCOUNTER — Ambulatory Visit: Payer: PPO | Admitting: Gastroenterology

## 2018-05-22 VITALS — BP 106/64 | HR 72 | Ht 74.0 in | Wt 202.4 lb

## 2018-05-22 DIAGNOSIS — J029 Acute pharyngitis, unspecified: Secondary | ICD-10-CM | POA: Diagnosis not present

## 2018-05-22 DIAGNOSIS — K219 Gastro-esophageal reflux disease without esophagitis: Secondary | ICD-10-CM

## 2018-05-22 MED ORDER — ESOMEPRAZOLE MAGNESIUM 40 MG PO CPDR: 40.0000 mg | DELAYED_RELEASE_CAPSULE | Freq: Two times a day (BID) | ORAL | 1 refills | Status: DC

## 2018-05-22 NOTE — Progress Notes (Signed)
05/22/2018 LORING LISKEY [id number] September 23, 1941   HISTORY OF PRESENT ILLNESS:  This is a 77 year old male who is patient of Dr. Lynne Leader (previously patient of Dr. Kelby Fam).  Was seen by me earlier this year with complaints of RLQ abdominal pain that had been present for 40 years.  At this point that pain has now about resolved.  Anyway, he comes in to our office today with complaints of reflux and sore throat.  He feels that overall he thinks that he's had reflux for a couple of years and did not recognize it.  Then, last fall he began taking zantac.  Around January of this year he started having sore throat as well.  PCP placed him on daily PPI, 40 mg and symptoms resolved.  He continued the medication for a few months and was doing well.  Now, the past several weeks, he has started with sore throat again despite his PPI.  He's also noted some heartburn and indigestion as well and got some liquid Maalox, which did help when he took it.  Reports what sounds like globus sensation, feels like there is something in his throat when he swallows.  He denies dysphagia or weight loss.     Past Medical History:  Diagnosis Date  . Allergy   . Atrial fibrillation ([place])   . Bradycardia   . Bradycardia   . Cardiac murmur   . Dizziness   . Glaucoma   . Heart palpitations   . Hyperlipidemia   . Hypertension   . Skin abnormalities    pre cancerous lesion R hand  . Sleep apnea    uses cpap  . Snoring    Past Surgical History:  Procedure Laterality Date  . APPENDECTOMY  1965  . COLONOSCOPY    . POLYPECTOMY    . TONSILLECTOMY     as a child    reports that he quit smoking about 52 years ago. His smoking use included cigarettes. He has never used smokeless tobacco. He reports that he drinks alcohol. He reports that he does not use drugs. family history includes Breast cancer in his sister; Colon cancer in his paternal uncle and unknown relative; Diabetes in his sister; High blood pressure in  his sister; Mitral valve prolapse in his other; Stroke in his father, mother, and sister. Allergies  Allergen Reactions  . Combigan [Brimonidine Tartrate-Timolol] Itching    Itching eyes      Outpatient Encounter Medications as of 05/22/2018  Medication Sig  . Apoaequorin (PREVAGEN PO) Take 1 capsule by mouth as directed. Focus [facility] and memory.  Marland Kitchen aspirin 81 MG tablet Take 81 mg by mouth daily.   Marland Kitchen atorvastatin (LIPITOR) 10 MG tablet Take 10 mg by mouth daily.  . Coenzyme Q10 (CO Q 10 PO) Take 1 capsule by mouth daily.   . COSOPT PF 22.3-6.8 MG/ML SOLN Place 1 drop into both eyes 2 (two) times daily.   . Cranberry 400 MG CAPS Take 1 capsule by mouth daily.  Marland Kitchen diltiazem (DILACOR XR) 240 MG 24 hr capsule Take 1 capsule (240 mg total) by mouth daily.  Marland Kitchen esomeprazole (NEXIUM) 40 MG capsule Take 40 mg by mouth daily at 12 noon.  . fish oil-omega-3 fatty acids 1000 MG capsule Take 1 g by mouth 2 (two) times daily.   Marland Kitchen latanoprost (XALATAN) 0.005 % ophthalmic solution Place 1 drop into both eyes at bedtime.  Marland Kitchen losartan-hydrochlorothiazide (HYZAAR) 100-25 MG tablet Take 1 tablet by mouth  daily.  . Misc Natural Products (PROSTATE HEALTH) CAPS Take 1 capsule by mouth daily.  . multivitamin (THERAGRAN) per tablet Take 1 tablet by mouth daily.  Marland Kitchen oxymetazoline (AFRIN) 0.05 % nasal spray Place 1 spray into both nostrils daily as needed for congestion.  . Pomegranate 250 MG CAPS Take 1 capsule by mouth daily.  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  . Probiotic Product (ALIGN PO) Take by mouth. Take daily  . RHOPRESSA 0.02 % SOLN as directed.  . valACYclovir (VALTREX) 1000 MG tablet Take 1,000 mg by mouth as directed.    No facility-administered encounter medications on file as of 05/22/2018.      REVIEW OF SYSTEMS  : All other systems reviewed and negative except where noted in the History of Present Illness.   PHYSICAL EXAM: BP 106/64    Pulse 72   Ht 6\' 2"  (1.88 m)   Wt 202 lb 6 oz (91.8 kg)   BMI 25.98 kg/m  General: Well developed white male in no acute distress Head: Normocephalic and atraumatic Eyes:  Sclerae anicteric, conjunctiva pink. Ears: Normal auditory acuity Lungs: Clear throughout to auscultation; no W/R/R. Heart: Regular rate and rhythm; no M/R/G. Abdomen: Soft, non-distended.  BS present.  Non-tender. Musculoskeletal: Symmetrical with no gross deformities  Skin: No lesions on visible extremities Extremities: No edema  Neurological: Alert oriented x 4, grossly non-focal Psychological:  Alert and cooperative. Normal mood and affect  ASSESSMENT AND PLAN: *GERD and sore throat:  Thinks that he's had reflux issues for a few years that had not been treated.  Initially symptoms got better and essentially resolved with daily PPI but now with recurrent symptom again.  Discussed reflux dietary measures; will give literature.  Will increase PPI to BID for 4-6 weeks and then likely will try to back down to once daily again.  Will schedule for EGD with Dr. Fuller Plan. *RLQ abdominal pain:  This has about resolved at this point.  **The risks, benefits, and alternatives to EGD were discussed with the patient and he consents to proceed.   CC:  Darcus Austin, MD

## 2018-05-22 NOTE — Progress Notes (Signed)
Reviewed and agree with management plan.  Cervando Durnin T. Lyssa Hackley, MD FACG 

## 2018-05-22 NOTE — Patient Instructions (Signed)
You have been scheduled for an endoscopy. Please follow written instructions given to you at your visit today. If you use inhalers (even only as needed), please bring them with you on the day of your procedure. Your physician has requested that you go to www.startemmi.com and enter the access code given to you at your visit today. This web site gives a general overview about your procedure. However, you should still follow specific instructions given to you by our office regarding your preparation for the procedure.  Increase esomeprazole to 40 mg twice a day.   We have given you a handout on coping with reflux.

## 2018-06-04 ENCOUNTER — Telehealth: Payer: Self-pay | Admitting: Cardiovascular Disease

## 2018-06-04 NOTE — Telephone Encounter (Signed)
New Message    Pt c/o of Chest Pain: STAT if CP now or developed within 24 hours  1. Are you having CP right now? Yes   2. Are you experiencing any other symptoms (ex. SOB, nausea, vomiting, sweating)? No symptoms  3. How long have you been experiencing CP? Sunday   4. Is your CP continuous or coming and going? Continuous  5. Have you taken Nitroglycerin? No  ?

## 2018-06-04 NOTE — Telephone Encounter (Signed)
Patient calling and states that Sunday he walked a mile around the track and after he finished walking he started to have left sided chest pain underneath his breast that he rates 6/10. He states that his pain has been continuous since then but eases off and comes back. He states that today he rates his pain 3/10. He states that his pain is at rest and when he is up walking around. He states that there isn't anything that he can do to make the pain feel better or worse. Patient denies SOB, radiation, lightheadedness, dizziness, palpitations, n/v, or any other symptoms. Patient states that his HR has been regular in the 70s. Patient states that the last time he checked his BP it was 120/70. Patient with normal myoview 03/2018 and no Hx of CAD. Advised patient to take Tylenol for pain and follow up with PCP. Patient verbalized understanding and thanked me for the call.

## 2018-06-25 DIAGNOSIS — H353111 Nonexudative age-related macular degeneration, right eye, early dry stage: Secondary | ICD-10-CM | POA: Diagnosis not present

## 2018-06-25 DIAGNOSIS — H43813 Vitreous degeneration, bilateral: Secondary | ICD-10-CM | POA: Diagnosis not present

## 2018-06-25 DIAGNOSIS — H43392 Other vitreous opacities, left eye: Secondary | ICD-10-CM | POA: Diagnosis not present

## 2018-06-25 DIAGNOSIS — H353221 Exudative age-related macular degeneration, left eye, with active choroidal neovascularization: Secondary | ICD-10-CM | POA: Diagnosis not present

## 2018-07-02 DIAGNOSIS — H401121 Primary open-angle glaucoma, left eye, mild stage: Secondary | ICD-10-CM | POA: Diagnosis not present

## 2018-07-17 DIAGNOSIS — H401132 Primary open-angle glaucoma, bilateral, moderate stage: Secondary | ICD-10-CM | POA: Diagnosis not present

## 2018-08-01 ENCOUNTER — Encounter: Payer: Self-pay | Admitting: Gastroenterology

## 2018-08-12 ENCOUNTER — Ambulatory Visit (AMBULATORY_SURGERY_CENTER): Payer: PPO | Admitting: Gastroenterology

## 2018-08-12 ENCOUNTER — Encounter: Payer: Self-pay | Admitting: Gastroenterology

## 2018-08-12 VITALS — BP 135/80 | HR 52 | Temp 98.0°F | Resp 11 | Ht 74.0 in | Wt 202.0 lb

## 2018-08-12 DIAGNOSIS — K219 Gastro-esophageal reflux disease without esophagitis: Secondary | ICD-10-CM

## 2018-08-12 DIAGNOSIS — K3189 Other diseases of stomach and duodenum: Secondary | ICD-10-CM | POA: Diagnosis not present

## 2018-08-12 DIAGNOSIS — I10 Essential (primary) hypertension: Secondary | ICD-10-CM | POA: Diagnosis not present

## 2018-08-12 DIAGNOSIS — R1031 Right lower quadrant pain: Secondary | ICD-10-CM | POA: Diagnosis not present

## 2018-08-12 DIAGNOSIS — G4733 Obstructive sleep apnea (adult) (pediatric): Secondary | ICD-10-CM | POA: Diagnosis not present

## 2018-08-12 MED ORDER — SODIUM CHLORIDE 0.9 % IV SOLN: 500.0000 mL | Freq: Once | INTRAVENOUS | Status: DC

## 2018-08-12 NOTE — Progress Notes (Signed)
Called to room to assist during endoscopic procedure.  Patient ID and intended procedure confirmed with present staff. Received instructions for my participation in the procedure from the performing physician.  

## 2018-08-12 NOTE — Op Note (Signed)
[place] Patient Name: Jonathan Lane Procedure Date: 08/12/2018 9:30 AM MRN: [id number] Endoscopist: Ladene Artist , MD Age: 77 Referring MD:  Date of Birth: 07-03-1941 Gender: Male Account #: [id number] Procedure:                Upper GI endoscopy Indications:              Abdominal pain in the right lower quadrant,                            Gastroesophageal reflux disease Medicines:                Monitored Anesthesia Care Procedure:                Pre-Anesthesia Assessment:                           - Prior to the procedure, a History and Physical                            was performed, and patient medications and                            allergies were reviewed. The patient's tolerance of                            previous anesthesia was also reviewed. The risks                            and benefits of the procedure and the sedation                            options and risks were discussed with the patient.                            All questions were answered, and informed consent                            was obtained. Prior Anticoagulants: The patient has                            taken no previous anticoagulant or antiplatelet                            agents. ASA Grade Assessment: II - A patient with                            mild systemic disease. After reviewing the risks                            and benefits, the patient was deemed in                            satisfactory condition to undergo the procedure.  After obtaining informed consent, the endoscope was                            passed under direct vision. Throughout the                            procedure, the patient's blood pressure, pulse, and                            oxygen saturations were monitored continuously. The                            Endoscope was introduced through the mouth, and                            advanced to the second part  of duodenum. The upper                            GI endoscopy was accomplished without difficulty.                            The patient tolerated the procedure well. Scope In: Scope Out: Findings:                 The Z-line was variable and was found at the                            gastroesophageal junction. Biopsies were taken with                            a cold forceps for histology.                           One benign-appearing, intrinsic mild stenosis was                            found at the gastroesophageal junction. This                            stenosis measured 1.4 cm (inner diameter). The                            stenosis was traversed.                           The exam of the esophagus was otherwise normal.                           Diffuse moderately erythematous mucosa without                            bleeding was found in the entire examined stomach.                            Biopsies were taken with a  cold forceps for                            histology.                           A small hiatal hernia was present.                           The exam of the stomach was otherwise normal.                           The duodenal bulb and second portion of the                            duodenum were normal. Complications:            No immediate complications. Estimated Blood Loss:     Estimated blood loss was minimal. Impression:               - Z-line variable, at the gastroesophageal                            junction. Biopsied.                           - Benign-appearing esophageal stenosis.                           - Erythematous mucosa in the stomach. Biopsied.                           - Small hiatal hernia.                           - Normal duodenal bulb and second portion of the                            duodenum. Recommendation:           - Patient has a contact number available for                            emergencies. The signs and  symptoms of potential                            delayed complications were discussed with the                            patient. Return to normal activities tomorrow.                            Written discharge instructions were provided to the                            patient.                           -  Resume previous diet.                           - Antireflux measures.                           - Continue present medications.                           - Await pathology results. Ladene Artist, MD 08/12/2018 9:45:16 AM This report has been signed electronically.

## 2018-08-12 NOTE — Progress Notes (Signed)
Report to PACU, RN, vss, BBS= Clear.  

## 2018-08-12 NOTE — Patient Instructions (Signed)
Impression/Recommendations:  Hiatal hernia handout given to patient. Antireflux handout given to patient.  Resume previous diet. Continue present medications.  Await pathology results.  YOU HAD AN ENDOSCOPIC PROCEDURE TODAY AT [facility]:   Refer to the procedure report that was given to you for any specific questions about what was found during the examination.  If the procedure report does not answer your questions, please call your gastroenterologist to clarify.  If you requested that your care partner not be given the details of your procedure findings, then the procedure report has been included in a sealed envelope for you to review at your convenience later.  YOU SHOULD EXPECT: Some feelings of bloating in the abdomen. Passage of more gas than usual.  Walking can help get rid of the air that was put into your GI tract during the procedure and reduce the bloating. If you had a lower endoscopy (such as a colonoscopy or flexible sigmoidoscopy) you may notice spotting of blood in your stool or on the toilet paper. If you underwent a bowel prep for your procedure, you may not have a normal bowel movement for a few days.  Please Note:  You might notice some irritation and congestion in your nose or some drainage.  This is from the oxygen used during your procedure.  There is no need for concern and it should clear up in a day or so.  SYMPTOMS TO REPORT IMMEDIATELY:  Following upper endoscopy (EGD)  Vomiting of blood or coffee ground material  New chest pain or pain under the shoulder blades  Painful or persistently difficult swallowing  New shortness of breath  Fever of 100F or higher  Black, tarry-looking stools  For urgent or emergent issues, a gastroenterologist can be reached at any hour by calling [phone].   DIET:  We do recommend a small meal at first, but then you may proceed to your regular diet.  Drink plenty of fluids but you should avoid alcoholic  beverages for 24 hours.  ACTIVITY:  You should plan to take it easy for the rest of today and you should NOT DRIVE or use heavy machinery until tomorrow (because of the sedation medicines used during the test).    FOLLOW UP: Our staff will call the number listed on your records the next business day following your procedure to check on you and address any questions or concerns that you may have regarding the information given to you following your procedure. If we do not reach you, we will leave a message.  However, if you are feeling well and you are not experiencing any problems, there is no need to return our call.  We will assume that you have returned to your regular daily activities without incident.  If any biopsies were taken you will be contacted by phone or by letter within the next 1-3 weeks.  Please call us at [phone] if you have not heard about the biopsies in 3 weeks.    SIGNATURES/CONFIDENTIALITY: You and/or your care partner have signed paperwork which will be entered into your electronic medical record.  These signatures attest to the fact that that the information above on your After Visit Summary has been reviewed and is understood.  Full responsibility of the confidentiality of this discharge information lies with you and/or your care-partner.

## 2018-08-13 ENCOUNTER — Telehealth: Payer: Self-pay

## 2018-08-13 ENCOUNTER — Other Ambulatory Visit: Payer: Self-pay | Admitting: Gastroenterology

## 2018-08-13 DIAGNOSIS — H401131 Primary open-angle glaucoma, bilateral, mild stage: Secondary | ICD-10-CM | POA: Diagnosis not present

## 2018-08-13 DIAGNOSIS — H2513 Age-related nuclear cataract, bilateral: Secondary | ICD-10-CM | POA: Diagnosis not present

## 2018-08-13 DIAGNOSIS — H353111 Nonexudative age-related macular degeneration, right eye, early dry stage: Secondary | ICD-10-CM | POA: Diagnosis not present

## 2018-08-13 DIAGNOSIS — H353221 Exudative age-related macular degeneration, left eye, with active choroidal neovascularization: Secondary | ICD-10-CM | POA: Diagnosis not present

## 2018-08-13 NOTE — Telephone Encounter (Signed)
  Follow up Call-  Call back number 08/12/2018  Post procedure Call Back phone  # [phone]  Permission to leave phone message Yes  Some recent data might be hidden     Patient questions:  Do you have a fever, pain , or abdominal swelling? No. Pain Score  0 *  Have you tolerated food without any problems? Yes.    Have you been able to return to your normal activities? Yes.    Do you have any questions about your discharge instructions: Diet   No. Medications  No. Follow up visit  No.  Do you have questions or concerns about your Care? No.  Actions: * If pain score is 4 or above: No action needed, pain <4.

## 2018-08-13 NOTE — Telephone Encounter (Signed)
Left message

## 2018-08-27 ENCOUNTER — Encounter: Payer: Self-pay | Admitting: Gastroenterology

## 2018-08-29 DIAGNOSIS — D485 Neoplasm of uncertain behavior of skin: Secondary | ICD-10-CM | POA: Diagnosis not present

## 2018-08-29 DIAGNOSIS — D225 Melanocytic nevi of trunk: Secondary | ICD-10-CM | POA: Diagnosis not present

## 2018-08-29 DIAGNOSIS — L570 Actinic keratosis: Secondary | ICD-10-CM | POA: Diagnosis not present

## 2018-08-29 DIAGNOSIS — Z86018 Personal history of other benign neoplasm: Secondary | ICD-10-CM | POA: Diagnosis not present

## 2018-08-29 DIAGNOSIS — Z85828 Personal history of other malignant neoplasm of skin: Secondary | ICD-10-CM | POA: Diagnosis not present

## 2018-08-29 DIAGNOSIS — D224 Melanocytic nevi of scalp and neck: Secondary | ICD-10-CM | POA: Diagnosis not present

## 2018-08-29 DIAGNOSIS — B078 Other viral warts: Secondary | ICD-10-CM | POA: Diagnosis not present

## 2018-08-30 ENCOUNTER — Telehealth: Payer: Self-pay | Admitting: Gastroenterology

## 2018-08-30 NOTE — Telephone Encounter (Signed)
I reviewed all the results with the patient.  All questions answered.  He will call back for any additional questions or concerns.

## 2018-09-03 DIAGNOSIS — H353221 Exudative age-related macular degeneration, left eye, with active choroidal neovascularization: Secondary | ICD-10-CM | POA: Diagnosis not present

## 2018-09-03 DIAGNOSIS — H43392 Other vitreous opacities, left eye: Secondary | ICD-10-CM | POA: Diagnosis not present

## 2018-09-03 DIAGNOSIS — H353111 Nonexudative age-related macular degeneration, right eye, early dry stage: Secondary | ICD-10-CM | POA: Diagnosis not present

## 2018-09-03 DIAGNOSIS — H43813 Vitreous degeneration, bilateral: Secondary | ICD-10-CM | POA: Diagnosis not present

## 2018-10-03 DIAGNOSIS — M5030 Other cervical disc degeneration, unspecified cervical region: Secondary | ICD-10-CM | POA: Diagnosis not present

## 2018-10-03 DIAGNOSIS — M5412 Radiculopathy, cervical region: Secondary | ICD-10-CM | POA: Diagnosis not present

## 2018-10-09 DIAGNOSIS — H6120 Impacted cerumen, unspecified ear: Secondary | ICD-10-CM | POA: Diagnosis not present

## 2018-10-09 DIAGNOSIS — Z23 Encounter for immunization: Secondary | ICD-10-CM | POA: Diagnosis not present

## 2018-10-09 DIAGNOSIS — Z0000 Encounter for general adult medical examination without abnormal findings: Secondary | ICD-10-CM | POA: Diagnosis not present

## 2018-10-09 DIAGNOSIS — I10 Essential (primary) hypertension: Secondary | ICD-10-CM | POA: Diagnosis not present

## 2018-10-09 DIAGNOSIS — F411 Generalized anxiety disorder: Secondary | ICD-10-CM | POA: Diagnosis not present

## 2018-10-09 DIAGNOSIS — K219 Gastro-esophageal reflux disease without esophagitis: Secondary | ICD-10-CM | POA: Diagnosis not present

## 2018-10-09 DIAGNOSIS — E782 Mixed hyperlipidemia: Secondary | ICD-10-CM | POA: Diagnosis not present

## 2018-10-17 ENCOUNTER — Ambulatory Visit: Payer: PPO | Admitting: Nurse Practitioner

## 2018-10-18 DIAGNOSIS — M542 Cervicalgia: Secondary | ICD-10-CM | POA: Diagnosis not present

## 2018-10-23 DIAGNOSIS — M542 Cervicalgia: Secondary | ICD-10-CM | POA: Diagnosis not present

## 2018-10-25 ENCOUNTER — Telehealth: Payer: Self-pay | Admitting: Cardiovascular Disease

## 2018-10-25 DIAGNOSIS — M542 Cervicalgia: Secondary | ICD-10-CM | POA: Diagnosis not present

## 2018-10-25 MED ORDER — HYDROCHLOROTHIAZIDE 25 MG PO TABS: 25.0000 mg | ORAL_TABLET | Freq: Every day | ORAL | 3 refills | Status: DC

## 2018-10-25 MED ORDER — LOSARTAN POTASSIUM 100 MG PO TABS: 100.0000 mg | ORAL_TABLET | Freq: Every day | ORAL | 3 refills | Status: DC

## 2018-10-25 NOTE — Telephone Encounter (Signed)
Made changes per pharmacy's request.

## 2018-10-25 NOTE — Telephone Encounter (Signed)
Yes this fine, thanks for checking

## 2018-10-25 NOTE — Telephone Encounter (Signed)
New Message         Pharmacist is calling because "Losartin HCTZ 100/25 MG is on back order and they were calling to see if they can split it into Losartin 100 HCZT 25 MG. (Ask for [place]).

## 2018-10-28 DIAGNOSIS — M542 Cervicalgia: Secondary | ICD-10-CM | POA: Diagnosis not present

## 2018-10-31 DIAGNOSIS — M542 Cervicalgia: Secondary | ICD-10-CM | POA: Diagnosis not present

## 2018-11-06 DIAGNOSIS — M542 Cervicalgia: Secondary | ICD-10-CM | POA: Diagnosis not present

## 2018-11-11 DIAGNOSIS — M542 Cervicalgia: Secondary | ICD-10-CM | POA: Diagnosis not present

## 2018-11-12 DIAGNOSIS — H43813 Vitreous degeneration, bilateral: Secondary | ICD-10-CM | POA: Diagnosis not present

## 2018-11-12 DIAGNOSIS — H353111 Nonexudative age-related macular degeneration, right eye, early dry stage: Secondary | ICD-10-CM | POA: Diagnosis not present

## 2018-11-12 DIAGNOSIS — H43392 Other vitreous opacities, left eye: Secondary | ICD-10-CM | POA: Diagnosis not present

## 2018-11-12 DIAGNOSIS — H353221 Exudative age-related macular degeneration, left eye, with active choroidal neovascularization: Secondary | ICD-10-CM | POA: Diagnosis not present

## 2018-11-21 DIAGNOSIS — H401132 Primary open-angle glaucoma, bilateral, moderate stage: Secondary | ICD-10-CM | POA: Diagnosis not present

## 2019-01-21 NOTE — Progress Notes (Signed)
[facility]  PATIENT: Jonathan Lane DOB: Mar 24, 1941   REASON FOR VISIT: follow up for obstructive sleep apnea noncompliant with CPAP HISTORY FROM: Patient    HISTORY OF PRESENT ILLNESS:Donaciano F Eckrich is a 78 y.o., caucasian, married, right handed male , who is seen here upon referral from Dr. Inda Merlin for a sleep evaluation, [place] has continued to lose weight and is actually visibly slender, he has less snoring which is confirmed by his spouse. The patient underwent a polysomnography on 05-20-14 which documented an AHI of 7.2 and an RDI of 7.2 as well. The sleep efficiency was 77% the patient AHI was greatest during REM sleep and partially depending on sleep position. During REM sleep the AHI was 15.1 and in supine sleep 8.0. The last option saturation was 86% is about 45 minutes of desaturations. Heart rate was borderline low between 49 and 51 beats per minute.  The patient's main problem seems to be fatigue as well as a slightly elevated daytime sleepiness with an Epworth of 12 points. Be discussed today treatment options for this mild sleep apnea. Since REM sleep seems to accentuate the sleep apnea positive airway pressure therapy is a viable option to treat him it would be a very small pressure [name] for about 14 days. Alternative therapies oppositional which the patient already is aware of.  The position of sleep however did not accentuated apneas very significantly. Since most of the apneas were actually REM dependent medication to suppress  REM sleep or prolonged the REM latency , which is also a viable option. The patient has  retrognathia and a dental advancement therapy could be addressed should these other options fail.  The patient's usual bedtime is around 12:00, falls asleep promptly , rises in the morning between 7 and 8 AM. Appears not to be asleep for the full  interval. He states that he wakes up frequently about every 30 minutes after an initial  arousal at about 4 AM. He's not sure why he wakes up so frequently; he is not in pain, he does not have shortness of breath or choking, nor nightmares. Goes to  the bathroom between one or 2 times at night.  He estimates his total sleep time to be close to 5.5 hours. He has been witnessed to snore and his wife reports him to have apnea. His wife reports that he would have crescendo breathing, that after a period of shallow breathing or stopping to breathe he would seemingly gasp for air and  jerk. He has daytime excessive sleepiness and severe fatigue. The patient reports no refreshing sleep and non restorative sleep for well over 2 decades. He was employed at [employer]. He retired in 2000.  He was working irregular hours , 60 hrs. /week . No caffeine, his PVCs are resolved, rare ETOH, no tobacco use. He has a known history of nasal septal deviation, had a tonsillectomy in childhood that caused a bleeding, and has a history of PVCs. Non smoker, 5-6 alcoholic drinks a week.   He is in the process of losing weight ( 30 pounds already ) , but has continued to snore, and he has nocturia, significant retrognathia. He has a mustache.  He has been evaluated for another adenoid surgery . Treatment plan and additional workup : Not interested in sinus surgery, has a blocked right nasion, on Afrin. I will write for fexofenadine daily use and urged him to hydrate well , try Mucinex.   Saline nose  rinse.   10-11-2015, We are is in today for compliance report on Mr. Degeorge CPAP use he has been 100% compliant in number of days and 97% compliance for over 4 hours of daily use. His average user time is 6 hours and 34 minutes his machine is on auto set, between 4 and 10 cm water with full-time and expiratory pressure relief of 3 cm water. His AHI 0.6 he still has moderate severe air leaks his 95th percentile pressure is 8.2 cm water. There needs to be no adjustments made.  He has reported neither  headaches, nor poor sleep. He is refreshed.   10-10-2016, I have the pleasure of seeing Mr. Constance Haw today, reported a normal degree of daytime sleepiness with an Epworth score of 7, fatigue severity only 17, geriatric depression score 2 out of 15. He is an compliant CPAP user with 87% compliance average user time of 5 hours and 10 minutes each night. He's using an AutoSet between 4 and 10 cm water pressure with 3 cm EPR. 95th percentile pressure is 7.9 and his residual AHI is 0.7 excellent resolution. He states that he has a little bit sleepier lately than he used to be and that he also has trouble to get to sleep before 2 AM if he takes daytime naps. He has therefore eliminate those over 30 minutes.   UPDATE 10/31/2018CM Mr. Litsey, 78 year old male returns for follow-up with history of obstructive sleep apnea. He is here for CPAP compliance. ESS score today is 4 and fatigue severity scale is 9. CPAP compliance data dated 08/17/2017 - 10/15/2017 shows greater than 4 hours at 85% average usage 6 hours 32 minutes. Pressure 4-10 cm EPR 3. AHI 1.1. He uses nasal pillows. He denies afternoon naps. He is still physically active. He returns for reevaluation  UPDATE 2/5/2020CM Mr. Constance Haw 78 year old male returns for follow-up with history of obstructive sleep apnea.  He was last seen in the office October 2018.  He went on the cruise in February of the next year got a flu virus on the cruise, had some problems with acid reflux, diagnosed with macular degeneration etc. and just has not used the CPAP machine in over a year now.  He was made aware that his insurance may not pay for his supplies when he orders them, he has to be at least 30 days compliant to get supplies paid for by his insurance company.  He is not due for a new machine.  He was also made aware of the risk and ramifications of not being compliant with CPAP.  He returns for reevaluation  REVIEW OF SYSTEMS: Full 14 system review of systems  performed and notable only for those listed, all others are neg:  Constitutional: neg  Cardiovascular: neg Ear/Nose/Throat: Ringing in the ears Skin: neg Eyes: neg Respiratory: neg Gastroitestinal: neg  Hematology/Lymphatic: neg  Endocrine: neg Musculoskeletal:neg Allergy/Immunology: neg Neurological: neg Psychiatric: neg Sleep : Obstructive sleep apnea with CPAP   ALLERGIES: Allergies  Allergen Reactions  . Combigan [Brimonidine Tartrate-Timolol] Itching    Itching eyes    HOME MEDICATIONS: Outpatient Medications Prior to Visit  Medication Sig Dispense Refill  . Apoaequorin (PREVAGEN PO) Take 1 capsule by mouth as directed. [facility] and memory.    Marland Kitchen aspirin 81 MG tablet Take 81 mg by mouth daily.     Marland Kitchen atorvastatin (LIPITOR) 10 MG tablet Take 10 mg by mouth daily.    . Coenzyme Q10 (CO Q 10 PO) Take 1 capsule by mouth  daily.     . COSOPT PF 22.3-6.8 MG/ML SOLN Place 1 drop into both eyes 2 (two) times daily.     . Cranberry 400 MG CAPS Take 1 capsule by mouth daily.    Marland Kitchen docusate sodium (COLACE) 100 MG capsule Take 200 mg by mouth daily.    Marland Kitchen esomeprazole (NEXIUM) 40 MG capsule Take 1 capsule (40 mg total) by mouth 2 (two) times daily before a meal. 60 capsule 1  . fish oil-omega-3 fatty acids 1000 MG capsule Take 1 g by mouth 2 (two) times daily.     . hydrochlorothiazide (HYDRODIURIL) 25 MG tablet Take 1 tablet (25 mg total) by mouth daily. 90 tablet 3  . latanoprost (XALATAN) 0.005 % ophthalmic solution Place 1 drop into both eyes at bedtime.    Marland Kitchen losartan (COZAAR) 100 MG tablet Take 1 tablet (100 mg total) by mouth daily. 90 tablet 3  . Misc Natural Products (PROSTATE HEALTH) CAPS Take 1 capsule by mouth daily.    . multivitamin (THERAGRAN) per tablet Take 1 tablet by mouth daily.    Marland Kitchen oxymetazoline (AFRIN) 0.05 % nasal spray Place 1 spray into both nostrils daily as needed for congestion.    . Pomegranate 250 MG CAPS Take 1 capsule by mouth daily.    Marland Kitchen  PRESCRIPTION MEDICATION Eyelea, monthly  (injection in the left eye for macular degeneration).    . Probiotic Product (ALIGN PO) Take by mouth. Take daily    . RHOPRESSA 0.02 % SOLN as directed.    Marland Kitchen UNABLE TO FIND Place into the left eye. Med Name: Gomez Cleverly Shots. 1 shot every 10 weeks.    . valACYclovir (VALTREX) 1000 MG tablet Take 1,000 mg by mouth as directed.     . diltiazem (DILACOR XR) 240 MG 24 hr capsule Take 1 capsule (240 mg total) by mouth daily. (Patient not taking: Reported on 01/22/2019) 90 capsule 3  . esomeprazole (NEXIUM) 40 MG capsule Take 40 mg by mouth daily.    Marland Kitchen esomeprazole (NEXIUM) 40 MG capsule TAKE 1 CAPSULE TWICE DAILY BEFORE MEALS 60 capsule 0   No facility-administered medications prior to visit.     PAST MEDICAL HISTORY: Past Medical History:  Diagnosis Date  . Atrial fibrillation ([place])   . Bradycardia   . Bradycardia   . Cardiac murmur   . Dizziness   . Glaucoma   . Heart palpitations   . Hyperlipidemia   . Hypertension   . Skin abnormalities    pre cancerous lesion R hand  . Sleep apnea    uses cpap  . Snoring     PAST SURGICAL HISTORY: Past Surgical History:  Procedure Laterality Date  . APPENDECTOMY  1965  . COLONOSCOPY    . POLYPECTOMY    . TONSILLECTOMY     as a child    FAMILY HISTORY: Family History  Problem Relation Age of Onset  . Stroke Father   . Stroke Mother   . Stroke Sister   . Breast cancer Sister   . High blood pressure Sister   . Diabetes Sister   . Colon cancer Other        Uncle  . Colon cancer Paternal Uncle   . Mitral valve prolapse Other        sugery 09-27-2017  . Rectal cancer Neg Hx   . Stomach cancer Neg Hx     SOCIAL HISTORY: Social History   Socioeconomic History  . Marital status: Married    Spouse name:  Lynda  . Number of children: 1  . Years of education: Masters  . Highest education level: Not on file  Occupational History    Employer: RETIRED  Social Needs  . Financial resource  strain: Not on file  . Food insecurity:    Worry: Not on file    Inability: Not on file  . Transportation needs:    Medical: Not on file    Non-medical: Not on file  Tobacco Use  . Smoking status: Former Smoker    Types: Cigarettes    Last attempt to quit: 12/18/1965    Years since quitting: 53.1  . Smokeless tobacco: Never Used  Substance and Sexual Activity  . Alcohol use: Yes    Comment: 5-7 drinks per week  . Drug use: No  . Sexual activity: Not on file  Lifestyle  . Physical activity:    Days per week: Not on file    Minutes per session: Not on file  . Stress: Not on file  Relationships  . Social connections:    Talks on phone: Not on file    Gets together: Not on file    Attends religious service: Not on file    Active member of club or organization: Not on file    Attends meetings of clubs or organizations: Not on file    Relationship status: Not on file  . Intimate partner violence:    Fear of current or ex partner: Not on file    Emotionally abused: Not on file    Physically abused: Not on file    Forced sexual activity: Not on file  Other Topics Concern  . Not on file  Social History Narrative   Patient is married [name]).   Patient is retired.   Patient has one adult child.   Patient does not drink any caffeine.   Patient is right-handed.   Patient has a Scientist, water quality.              PHYSICAL EXAM  Vitals:   01/22/19 0829  BP: 127/77  Pulse: (!) 57  Weight: 207 lb 9.6 oz (94.2 kg)  Height: 6\' 2"  (1.88 m)   Body mass index is 26.65 kg/m.  Generalized: Well developed, in no acute distress  Head: normocephalic and atraumatic,. Oropharynx benign mallopatti 3 Neck: Supple, circumference 16 Lungs clear Musculoskeletal: No deformity  Skin no rash or edema Neurological examination   Mentation: Alert oriented to time, place, history taking. Attention span and concentration appropriate. Recent and remote memory intact.  Follows all commands speech  and language fluent.   Cranial nerve II-XII: .Pupils were equal round reactive to light extraocular movements were full, visual field were full on confrontational test. Facial sensation and strength were normal. hearing was intact to finger rubbing bilaterally. Uvula tongue midline. head turning and shoulder shrug were normal and symmetric.Tongue protrusion into cheek strength was normal. Motor: normal bulk and tone, full strength in the BUE, BLE, Sensory: normal and symmetric to light touch,  Coordination: finger-nose-finger, heel-to-shin bilaterally, no dysmetria Gait and Station: Rising up from seated position without assistance, normal stance,  moderate stride, good arm swing, smooth turning, able to perform tiptoe, and heel walking without difficulty. Tandem gait is steady  DIAGNOSTIC DATA (LABS, IMAGING, TESTING) - I reviewed patient records, labs, notes, testing and imaging myself where available.  No results found for: WBC, HGB, HCT, MCV, PLT    Component Value Date/Time   PROT 7.3 07/24/2007 1013   ALBUMIN 3.8  07/24/2007 1013   AST 26 07/24/2007 1013   ALT 23 07/24/2007 1013   ALKPHOS 81 07/24/2007 1013   BILITOT 0.9 07/24/2007 1013   Lab Results  Component Value Date   CHOL 182 07/24/2007   HDL 35.1 (L) 07/24/2007   LDLCALC 122 (H) 07/24/2007   TRIG 125 07/24/2007   CHOLHDL 5.2 CALC 07/24/2007    ASSESSMENT AND PLAN  78 y.o. year old male  has a past medical history of Allergy; Atrial fibrillation ([place]); Bradycardia; Bradycardia; Cardiac murmur; Dizziness; Glaucoma; Heart palpitations; Hyperlipidemia; Hypertension; Skin abnormalities; Sleep apnea; and Snoring. here to follow-up for obstructive sleep apnea.  He has not used his in over a year according to the patient.  PLAN: Reach out to [facility] for supplies you may have to pay for these  Once you have used CPAP for 30 days a compliance download can be obtained. If compliant they will then pay for supplies    Continue same settings Follow-up 3 months I explained in particular the risks and ramifications of untreated moderate to severe OSA, especially with respect to cardiovascular disease  including congestive heart failure, difficult to treat hypertension, cardiac arrhythmias, or stroke. Even type 2 diabetes has, in part, been linked to untreated OSA. Symptoms of untreated OSA include daytime sleepiness, memory problems, mood irritability and mood disorder such as depression and anxiety, lack of energy, as well as recurrent headaches, especially morning headaches. We talked about trying to maintain a healthy lifestyle in general, as well as the importance of weight control. I encouraged the patient to eat healthy, exercise daily and keep well hydrated, to keep a scheduled bedtime and wake time routine, to not skip any meals and eat healthy snacks in between meals Dennie Bible, [facility], [facility], [facility] [address], [place], [place] [address] [phone]

## 2019-01-22 ENCOUNTER — Ambulatory Visit (INDEPENDENT_AMBULATORY_CARE_PROVIDER_SITE_OTHER): Payer: PPO | Admitting: Nurse Practitioner

## 2019-01-22 ENCOUNTER — Encounter: Payer: Self-pay | Admitting: Nurse Practitioner

## 2019-01-22 DIAGNOSIS — G4733 Obstructive sleep apnea (adult) (pediatric): Secondary | ICD-10-CM | POA: Diagnosis not present

## 2019-01-22 NOTE — Patient Instructions (Signed)
Pt has not used CPAP  Reach out to [facility] for supplies you may have to pay for these  Once you have used for 30 days a compliance download can be obtained. If compliant they will then pay for supplies  Continue same settings Follow-up 3 months

## 2019-01-31 DIAGNOSIS — H2513 Age-related nuclear cataract, bilateral: Secondary | ICD-10-CM | POA: Diagnosis not present

## 2019-01-31 DIAGNOSIS — H401132 Primary open-angle glaucoma, bilateral, moderate stage: Secondary | ICD-10-CM | POA: Diagnosis not present

## 2019-01-31 DIAGNOSIS — H2511 Age-related nuclear cataract, right eye: Secondary | ICD-10-CM | POA: Diagnosis not present

## 2019-01-31 DIAGNOSIS — H25812 Combined forms of age-related cataract, left eye: Secondary | ICD-10-CM | POA: Diagnosis not present

## 2019-02-04 DIAGNOSIS — H353221 Exudative age-related macular degeneration, left eye, with active choroidal neovascularization: Secondary | ICD-10-CM | POA: Diagnosis not present

## 2019-02-04 DIAGNOSIS — H43813 Vitreous degeneration, bilateral: Secondary | ICD-10-CM | POA: Diagnosis not present

## 2019-02-04 DIAGNOSIS — H35371 Puckering of macula, right eye: Secondary | ICD-10-CM | POA: Diagnosis not present

## 2019-02-04 DIAGNOSIS — H353111 Nonexudative age-related macular degeneration, right eye, early dry stage: Secondary | ICD-10-CM | POA: Diagnosis not present

## 2019-02-18 DIAGNOSIS — Z23 Encounter for immunization: Secondary | ICD-10-CM | POA: Diagnosis not present

## 2019-02-18 DIAGNOSIS — D485 Neoplasm of uncertain behavior of skin: Secondary | ICD-10-CM | POA: Diagnosis not present

## 2019-02-18 DIAGNOSIS — L820 Inflamed seborrheic keratosis: Secondary | ICD-10-CM | POA: Diagnosis not present

## 2019-03-06 DIAGNOSIS — G4733 Obstructive sleep apnea (adult) (pediatric): Secondary | ICD-10-CM | POA: Diagnosis not present

## 2019-04-06 NOTE — Progress Notes (Signed)
Virtual Visit via Video Note   This visit type was conducted due to national recommendations for restrictions regarding the COVID-19 Pandemic (e.g. social distancing) in an effort to limit this patient's exposure and mitigate transmission in our community.  Due to his co-morbid illnesses, this patient is at least at moderate risk for complications without adequate follow up.  This format is felt to be most appropriate for this patient at this time.  All issues noted in this document were discussed and addressed.  A limited physical exam was performed with this format.  Please refer to the patient's chart for his consent to telehealth for [facility].   Evaluation Performed:  Follow-up visit  Date:  04/10/2019   ID:  [name], DOB 02/20/1941, MRN [id number]  Patient Location: Home Provider Location: Office  PCP:  Darcus Austin, MD (Inactive)  Cardiologist:  [name], MD   Electrophysiologist:  None   Chief Complaint:  HTN  History of Present Illness:    Jonathan Lane is a 78 y.o. male with history of ETOH Use, Bradycardia, Palpitations and HTN. Wife Jonathan Lane also a patient of mine with many health issues that are stressful to patient. Atypical chest pain with normal myovue 03/26/18 BP Rx with diuretic, cardizem and losartan  Would like PSA done next visit Dr Jeffie Pollock did not draw he has mildly enlarged prostate. Walking and weight down 5 lbs. Needs refills   The patient does not have symptoms concerning for COVID-19 infection (fever, chills, cough, or new shortness of breath).    Past Medical History:  Diagnosis Date  . Atrial fibrillation ([place])   . Bradycardia   . Bradycardia   . Cardiac murmur   . Dizziness   . Glaucoma   . Heart palpitations   . Hyperlipidemia   . Hypertension   . Skin abnormalities    pre cancerous lesion R hand  . Sleep apnea    uses cpap  . Snoring    Past Surgical History:  Procedure Laterality Date  . APPENDECTOMY  1965  .  COLONOSCOPY    . POLYPECTOMY    . TONSILLECTOMY     as a child     Current Meds  Medication Sig  . Apoaequorin (PREVAGEN PO) Take 1 capsule by mouth as directed. [facility] and memory.  Marland Kitchen aspirin 81 MG tablet Take 81 mg by mouth daily.   Marland Kitchen atorvastatin (LIPITOR) 10 MG tablet Take 10 mg by mouth daily.  . Coenzyme Q10 (CO Q 10 PO) Take 1 capsule by mouth daily.   . COSOPT PF 22.3-6.8 MG/ML SOLN Place 1 drop into both eyes 2 (two) times daily.   . Cranberry 400 MG CAPS Take 1 capsule by mouth daily.  Marland Kitchen docusate sodium (COLACE) 100 MG capsule Take 200 mg by mouth daily.  Marland Kitchen esomeprazole (NEXIUM) 40 MG capsule Take 1 capsule (40 mg total) by mouth 2 (two) times daily before a meal.  . fish oil-omega-3 fatty acids 1000 MG capsule Take 1 g by mouth 2 (two) times daily.   . hydrochlorothiazide (HYDRODIURIL) 25 MG tablet Take 1 tablet (25 mg total) by mouth daily.  Marland Kitchen latanoprost (XALATAN) 0.005 % ophthalmic solution Place 1 drop into both eyes at bedtime.  Marland Kitchen losartan (COZAAR) 100 MG tablet Take 1 tablet (100 mg total) by mouth daily.  . Misc Natural Products (PROSTATE HEALTH) CAPS Take 1 capsule by mouth daily.  . multivitamin (THERAGRAN) per tablet Take 1 tablet by mouth daily.  Marland Kitchen  oxymetazoline (AFRIN) 0.05 % nasal spray Place 1 spray into both nostrils daily as needed for congestion.  . Pomegranate 250 MG CAPS Take 1 capsule by mouth daily.  . Probiotic Product (ALIGN PO) Take by mouth. Take daily  . RHOPRESSA 0.02 % SOLN as directed.  Marland Kitchen UNABLE TO FIND Place into the left eye. Med Name: Gomez Cleverly Shots. 1 shot every 10 weeks.  . valACYclovir (VALTREX) 1000 MG tablet Take 1,000 mg by mouth as directed.      Allergies:   Brimonidine tartrate-timolol   Social History   Tobacco Use  . Smoking status: Former Smoker    Types: Cigarettes    Last attempt to quit: 12/18/1965    Years since quitting: 53.3  . Smokeless tobacco: Never Used  Substance Use Topics  . Alcohol use: Yes    Comment: 5-7  drinks per week  . Drug use: No     Family Hx: The patient's family history includes Breast cancer in his sister; Colon cancer in his paternal uncle and another family member; Diabetes in his sister; High blood pressure in his sister; Mitral valve prolapse in an other family member; Stroke in his father, mother, and sister. There is no history of Rectal cancer or Stomach cancer.  ROS:   Please see the history of present illness.     All other systems reviewed and are negative.   Prior CV studies:   The following studies were reviewed today:  Myovue 03/2018  Labs/Other Tests and Data Reviewed:    ECG:  SR rate 58 inferolateral J point elevation   Recent Labs: No results found for requested labs within last 8760 hours.   Recent Lipid Panel Lab Results  Component Value Date/Time   CHOL 182 07/24/2007 10:13 AM   TRIG 125 07/24/2007 10:13 AM   HDL 35.1 (L) 07/24/2007 10:13 AM   CHOLHDL 5.2 CALC 07/24/2007 10:13 AM   LDLCALC 122 (H) 07/24/2007 10:13 AM    Wt Readings from Last 3 Encounters:  04/10/19 93 kg  01/22/19 94.2 kg  08/12/18 91.6 kg     Objective:    Vital Signs:  BP 125/75   Pulse (!) 55   Ht 6\' 2"  (1.88 m)   Wt 93 kg   BMI 26.32 kg/m    No distress  Skin warm and dry No tachypnea No JVP elevation  No edema  ASSESSMENT & PLAN:    1. HTN:  Well controlled.  Continue current medications and low sodium Dash type diet.   2. Palpitations: benign no documented arrhythmia improved with cardizem 3. Chest Pain : resolved normal myovue 03/2018 4. HLD:  On statin labs with primary  5. Prostate:  Sees Wrenn mildly enlarged PSA not done this year   COVID-19 Education: The signs and symptoms of COVID-19 were discussed with the patient and how to seek care for testing (follow up with PCP or arrange E-visit).  The importance of social distancing was discussed today.  Time:   Today, I have spent 30 minutes with the patient with telehealth technology discussing the  above problems.     Medication Adjustments/Labs and Tests Ordered: Current medicines are reviewed at length with the patient today.  Concerns regarding medicines are outlined above.   Tests Ordered: No orders of the defined types were placed in this encounter.   Medication Changes: No orders of the defined types were placed in this encounter.   Disposition:  Follow up in a year   Signed, [name], MD  04/10/2019 9:56 AM    [facility]

## 2019-04-07 DIAGNOSIS — N5201 Erectile dysfunction due to arterial insufficiency: Secondary | ICD-10-CM | POA: Diagnosis not present

## 2019-04-07 DIAGNOSIS — R351 Nocturia: Secondary | ICD-10-CM | POA: Diagnosis not present

## 2019-04-07 DIAGNOSIS — N401 Enlarged prostate with lower urinary tract symptoms: Secondary | ICD-10-CM | POA: Diagnosis not present

## 2019-04-09 ENCOUNTER — Telehealth: Payer: Self-pay

## 2019-04-09 NOTE — Telephone Encounter (Signed)
Virtual Visit Pre-Appointment Phone Call  "(Name), I am calling you today to discuss your upcoming appointment. We are currently trying to limit exposure to the virus that causes COVID-19 by seeing patients at home rather than in the office."  1. "What is the BEST phone number to call the day of the visit?" - include this in appointment notes  2. "Do you have or have access to (through a family member/friend) a smartphone with video capability that we can use for your visit?" a. If yes - list this number in appt notes as "cell" (if different from BEST phone #) and list the appointment type as a VIDEO visit in appointment notes b. If no - list the appointment type as a PHONE visit in appointment notes  3. Confirm consent - "In the setting of the current Covid19 crisis, you are scheduled for a (phone or video) visit with your provider on (date) at (time).  Just as we do with many in-office visits, in order for you to participate in this visit, we must obtain consent.  If you'd like, I can send this to your mychart (if signed up) or email for you to review.  Otherwise, I can obtain your verbal consent now.  All virtual visits are billed to your insurance company just like a normal visit would be.  By agreeing to a virtual visit, we'd like you to understand that the technology does not allow for your provider to perform an examination, and thus may limit your provider's ability to fully assess your condition. If your provider identifies any concerns that need to be evaluated in person, we will make arrangements to do so.  Finally, though the technology is pretty good, we cannot assure that it will always work on either your or our end, and in the setting of a video visit, we may have to convert it to a phone-only visit.  In either situation, we cannot ensure that we have a secure connection.  Are you willing to proceed? Yes  4. Advise patient to be prepared - "Two hours prior to your appointment, go  ahead and check your blood pressure, pulse, oxygen saturation, and your weight (if you have the equipment to check those) and write them all down. When your visit starts, your provider will ask you for this information. If you have an Apple Watch or Kardia device, please plan to have heart rate information ready on the day of your appointment. Please have a pen and paper handy nearby the day of the visit as well."  5. Give patient instructions for MyChart download to smartphone OR Doximity/Doxy.me as below if video visit (depending on what platform provider is using)  6. Inform patient they will receive a phone call 15 minutes prior to their appointment time (may be from unknown caller ID) so they should be prepared to answer    TELEPHONE CALL NOTE  Jonathan Lane has been deemed a candidate for a follow-up tele-health visit to limit community exposure during the Covid-19 pandemic. I spoke with the patient via phone to ensure availability of phone/video source, confirm preferred email & phone number, and discuss instructions and expectations.  I reminded Jonathan Lane to be prepared with any vital sign and/or heart rhythm information that could potentially be obtained via home monitoring, at the time of his visit. I reminded Jonathan Lane to expect a phone call prior to his visit.  Michaelyn Barter, RN 04/09/2019 3:58 PM   IF  USING DOXIMITY or DOXY.ME - The patient will receive a link just prior to their visit by text.     FULL LENGTH CONSENT FOR TELE-HEALTH VISIT   I hereby voluntarily request, consent and authorize [facility] and its employed or contracted physicians, physician assistants, nurse practitioners or other licensed health care professionals (the Practitioner), to provide me with telemedicine health care services (the "Services") as deemed necessary by the treating Practitioner. I acknowledge and consent to receive the Services by the Practitioner via  telemedicine. I understand that the telemedicine visit will involve communicating with the Practitioner through live audiovisual communication technology and the disclosure of certain medical information by electronic transmission. I acknowledge that I have been given the opportunity to request an in-person assessment or other available alternative prior to the telemedicine visit and am voluntarily participating in the telemedicine visit.  I understand that I have the right to withhold or withdraw my consent to the use of telemedicine in the course of my care at any time, without affecting my right to future care or treatment, and that the Practitioner or I may terminate the telemedicine visit at any time. I understand that I have the right to inspect all information obtained and/or recorded in the course of the telemedicine visit and may receive copies of available information for a reasonable fee.  I understand that some of the potential risks of receiving the Services via telemedicine include:  Marland Kitchen Delay or interruption in medical evaluation due to technological equipment failure or disruption; . Information transmitted may not be sufficient (e.g. poor resolution of images) to allow for appropriate medical decision making by the Practitioner; and/or  . In rare instances, security protocols could fail, causing a breach of personal health information.  Furthermore, I acknowledge that it is my responsibility to provide information about my medical history, conditions and care that is complete and accurate to the best of my ability. I acknowledge that Practitioner's advice, recommendations, and/or decision may be based on factors not within their control, such as incomplete or inaccurate data provided by me or distortions of diagnostic images or specimens that may result from electronic transmissions. I understand that the practice of medicine is not an exact science and that Practitioner makes no warranties or  guarantees regarding treatment outcomes. I acknowledge that I will receive a copy of this consent concurrently upon execution via email to the email address I last provided but may also request a printed copy by calling the office of [facility].    I understand that my insurance will be billed for this visit.   I have read or had this consent read to me. . I understand the contents of this consent, which adequately explains the benefits and risks of the Services being provided via telemedicine.  . I have been provided ample opportunity to ask questions regarding this consent and the Services and have had my questions answered to my satisfaction. . I give my informed consent for the services to be provided through the use of telemedicine in my medical care  By participating in this telemedicine visit I agree to the above.

## 2019-04-10 ENCOUNTER — Telehealth (INDEPENDENT_AMBULATORY_CARE_PROVIDER_SITE_OTHER): Payer: PPO | Admitting: Cardiovascular Disease

## 2019-04-10 ENCOUNTER — Other Ambulatory Visit: Payer: Self-pay

## 2019-04-10 ENCOUNTER — Encounter: Payer: Self-pay | Admitting: Cardiovascular Disease

## 2019-04-10 VITALS — BP 125/75 | HR 55 | Ht 74.0 in | Wt 205.0 lb

## 2019-04-10 DIAGNOSIS — I10 Essential (primary) hypertension: Secondary | ICD-10-CM

## 2019-04-10 MED ORDER — HYDROCHLOROTHIAZIDE 25 MG PO TABS: 25.0000 mg | ORAL_TABLET | Freq: Every day | ORAL | 3 refills | Status: DC

## 2019-04-10 MED ORDER — DILTIAZEM HCL ER 240 MG PO CP24: 240.0000 mg | ORAL_CAPSULE | Freq: Every day | ORAL | 3 refills | Status: DC

## 2019-04-10 MED ORDER — LOSARTAN POTASSIUM 100 MG PO TABS: 100.0000 mg | ORAL_TABLET | Freq: Every day | ORAL | 3 refills | Status: DC

## 2019-04-10 MED ORDER — ATORVASTATIN CALCIUM 10 MG PO TABS: 10.0000 mg | ORAL_TABLET | Freq: Every day | ORAL | 3 refills | Status: DC

## 2019-04-10 NOTE — Patient Instructions (Signed)

## 2019-05-01 ENCOUNTER — Telehealth: Payer: Self-pay

## 2019-05-01 NOTE — Telephone Encounter (Signed)
Spoke with the patient and they have given verbal consent to file their insurance and to do a doxy.me visit. E-mail, mobile number and carrier have been confirmed and sent.  E-mail: [email or web address]  Text: [phone] ([place])

## 2019-05-04 ENCOUNTER — Encounter: Payer: Self-pay | Admitting: Adult Health

## 2019-05-06 ENCOUNTER — Encounter: Payer: Self-pay | Admitting: Adult Health

## 2019-05-07 ENCOUNTER — Encounter: Payer: PPO | Admitting: Adult Health

## 2019-05-07 ENCOUNTER — Other Ambulatory Visit: Payer: Self-pay

## 2019-05-07 NOTE — Telephone Encounter (Signed)
Pt called and said he has not received the email.  He was asked if she checked him junk mail. Pt asked me to hold while he checked.  While holding the call disconnected.

## 2019-05-07 NOTE — Telephone Encounter (Signed)
Lovey Newcomer, RN to call.

## 2019-05-08 ENCOUNTER — Ambulatory Visit (INDEPENDENT_AMBULATORY_CARE_PROVIDER_SITE_OTHER): Payer: PPO | Admitting: Adult Health

## 2019-05-08 ENCOUNTER — Other Ambulatory Visit: Payer: Self-pay

## 2019-05-08 ENCOUNTER — Encounter: Payer: Self-pay | Admitting: Adult Health

## 2019-05-08 DIAGNOSIS — G4733 Obstructive sleep apnea (adult) (pediatric): Secondary | ICD-10-CM

## 2019-05-08 DIAGNOSIS — Z9989 Dependence on other enabling machines and devices: Secondary | ICD-10-CM

## 2019-05-08 NOTE — Progress Notes (Signed)
PATIENT: Jonathan Lane DOB: May 24, 1941  REASON FOR VISIT: follow up HISTORY FROM: patient  Virtual Visit via Video Note  I connected with Demerius Podolak Loschiavo on 05/08/19 at  9:00 AM EDT by a video enabled telemedicine application located remotely at [facility] and verified that I am speaking with the correct person using two identifiers who was located at their own home.   I discussed the limitations of evaluation and management by telemedicine and the availability of in person appointments. The patient expressed understanding and agreed to proceed.   PATIENT: Jonathan Lane DOB: [birth year]  REASON FOR VISIT: follow up HISTORY FROM: patient  HISTORY OF PRESENT ILLNESS: Today 05/08/19:  Mr. Jonathan Lane is a 78 year old male with a history of obstructive sleep apnea on CPAP.  He returns today for a virtual visit.  His download indicates that he uses machine nightly for compliance of 100%.  He uses machine greater than 4 hours each night.  On average he uses his machine 7 hours and 42 minutes.  His residual AHI is 1.1 on 4 to 10 cm of water with EPR 3.  He does have a leak in the 95th percentile at 45.6 L/min.  He states he is currently wearing the nasal pillows.  He states he is probably due for new supplies.  He also reports that he does feel the mask leaking.  He joins me today for a virtual visit    REVIEW OF SYSTEMS: Out of a complete 14 system review of symptoms, the patient complains only of the following symptoms, and all other reviewed systems are negative.  See HPI  ALLERGIES: Allergies  Allergen Reactions  . Brimonidine Tartrate-Timolol Itching    Itching eyes Itching eyes, rash    HOME MEDICATIONS: Outpatient Medications Prior to Visit  Medication Sig Dispense Refill  . Apoaequorin (PREVAGEN PO) Take 1 capsule by mouth as directed. [facility] and memory.    Marland Kitchen aspirin 81 MG tablet Take 81 mg by mouth daily.     Marland Kitchen atorvastatin (LIPITOR)  10 MG tablet Take 1 tablet (10 mg total) by mouth daily. 90 tablet 3  . Coenzyme Q10 (CO Q 10 PO) Take 1 capsule by mouth daily.     . COSOPT PF 22.3-6.8 MG/ML SOLN Place 1 drop into both eyes 2 (two) times daily.     . Cranberry 400 MG CAPS Take 1 capsule by mouth daily.    Marland Kitchen diltiazem (DILACOR XR) 240 MG 24 hr capsule Take 1 capsule (240 mg total) by mouth daily. 90 capsule 3  . docusate sodium (COLACE) 100 MG capsule Take 200 mg by mouth daily.    Marland Kitchen esomeprazole (NEXIUM) 40 MG capsule Take 1 capsule (40 mg total) by mouth 2 (two) times daily before a meal. 60 capsule 1  . fish oil-omega-3 fatty acids 1000 MG capsule Take 1 g by mouth 2 (two) times daily.     . hydrochlorothiazide (HYDRODIURIL) 25 MG tablet Take 1 tablet (25 mg total) by mouth daily. 90 tablet 3  . latanoprost (XALATAN) 0.005 % ophthalmic solution Place 1 drop into both eyes at bedtime.    Marland Kitchen losartan (COZAAR) 100 MG tablet Take 1 tablet (100 mg total) by mouth daily. 90 tablet 3  . Misc Natural Products (PROSTATE HEALTH) CAPS Take 1 capsule by mouth daily.    . multivitamin (THERAGRAN) per tablet Take 1 tablet by mouth daily.    Marland Kitchen oxymetazoline (AFRIN) 0.05 % nasal spray Place 1  spray into both nostrils daily as needed for congestion.    . Pomegranate 250 MG CAPS Take 1 capsule by mouth daily.    . Probiotic Product (ALIGN PO) Take by mouth. Take daily    . RHOPRESSA 0.02 % SOLN as directed.    Marland Kitchen UNABLE TO FIND Place into the left eye. Med Name: Gomez Cleverly Shots. 1 shot every 10 weeks.    . valACYclovir (VALTREX) 1000 MG tablet Take 1,000 mg by mouth as directed.      No facility-administered medications prior to visit.     PAST MEDICAL HISTORY: Past Medical History:  Diagnosis Date  . Atrial fibrillation ([place])   . Bradycardia   . Bradycardia   . Cardiac murmur   . Dizziness   . Glaucoma   . Heart palpitations   . Hyperlipidemia   . Hypertension   . Skin abnormalities    pre cancerous lesion R hand  . Sleep apnea     uses cpap  . Snoring     PAST SURGICAL HISTORY: Past Surgical History:  Procedure Laterality Date  . APPENDECTOMY  1965  . COLONOSCOPY    . POLYPECTOMY    . TONSILLECTOMY     as a child    FAMILY HISTORY: Family History  Problem Relation Age of Onset  . Stroke Father   . Stroke Mother   . Stroke Sister   . Breast cancer Sister   . High blood pressure Sister   . Diabetes Sister   . Colon cancer Other        Uncle  . Colon cancer Paternal Uncle   . Mitral valve prolapse Other        sugery 09-27-2017  . Rectal cancer Neg Hx   . Stomach cancer Neg Hx     SOCIAL HISTORY: Social History   Socioeconomic History  . Marital status: Married    Spouse name: Jonathan Lane  . Number of children: 1  . Years of education: Masters  . Highest education level: Not on file  Occupational History    Employer: RETIRED  Social Needs  . Financial resource strain: Not on file  . Food insecurity:    Worry: Not on file    Inability: Not on file  . Transportation needs:    Medical: Not on file    Non-medical: Not on file  Tobacco Use  . Smoking status: Former Smoker    Types: Cigarettes    Last attempt to quit: 12/18/1965    Years since quitting: 53.4  . Smokeless tobacco: Never Used  Substance and Sexual Activity  . Alcohol use: Yes    Comment: 5-7 drinks per week  . Drug use: No  . Sexual activity: Not on file  Lifestyle  . Physical activity:    Days per week: Not on file    Minutes per session: Not on file  . Stress: Not on file  Relationships  . Social connections:    Talks on phone: Not on file    Gets together: Not on file    Attends religious service: Not on file    Active member of club or organization: Not on file    Attends meetings of clubs or organizations: Not on file    Relationship status: Not on file  . Intimate partner violence:    Fear of current or ex partner: Not on file    Emotionally abused: Not on file    Physically abused: Not on file    Forced  sexual activity: Not on file  Other Topics Concern  . Not on file  Social History Narrative   Patient is married Jonathan Lane).   Patient is retired.   Patient has one adult child.   Patient does not drink any caffeine.   Patient is right-handed.   Patient has a Scientist, water quality.               PHYSICAL EXAM Generalized: Well developed, in no acute distress   Neurological examination  Mentation: Alert oriented to time, place, history taking. Follows all commands speech and language fluent Cranial nerve II-XII:Extraocular movements were full. Facial symmetry noted. uvula tongue midline. Head turning and shoulder shrug  were normal and symmetric. Motor: Good strength throughout subjectively per patient Sensory: Sensory testing is intact to soft touch on all 4 extremities subjectively per patient Reflexes: UTA  DIAGNOSTIC DATA (LABS, IMAGING, TESTING) - I reviewed patient records, labs, notes, testing and imaging myself where available.  No results found for: WBC, HGB, HCT, MCV, PLT    Component Value Date/Time   PROT 7.3 07/24/2007 1013   ALBUMIN 3.8 07/24/2007 1013   AST 26 07/24/2007 1013   ALT 23 07/24/2007 1013   ALKPHOS 81 07/24/2007 1013   BILITOT 0.9 07/24/2007 1013   Lab Results  Component Value Date   CHOL 182 07/24/2007   HDL 35.1 (L) 07/24/2007   LDLCALC 122 (H) 07/24/2007   TRIG 125 07/24/2007   CHOLHDL 5.2 CALC 07/24/2007   No results found for: HGBA1C No results found for: VITAMINB12 No results found for: TSH    ASSESSMENT AND PLAN 78 y.o. year old male  has a past medical history of Atrial fibrillation (HCC), Bradycardia, Bradycardia, Cardiac murmur, Dizziness, Glaucoma, Heart palpitations, Hyperlipidemia, Hypertension, Skin abnormalities, Sleep apnea, and Snoring. here with :  1.  Obstructive sleep apnea on CPAP  The patient CPAP download shows excellent compliance and good treatment of his apnea.  He is encouraged to continue using CPAP nightly and  greater than 4 hours each night.  His download does show a leak with his mask.  I have advised that he should request new supplies, if he continues to feel the mask leaking we will send him for mask refitting.  He will follow-up in 1 year or sooner if needed.   I spent 15 minutes with the patient this time was spent reviewing his CPAP download and discussing plan of care with the patient   Ward Givens, MSN, NP-C 05/08/2019, 9:22 AM [facility] [address] [place], [place] [address] [phone]

## 2019-05-14 NOTE — Progress Notes (Signed)
This encounter was created in error - please disregard.  This encounter was created in error - please disregard.

## 2019-05-27 DIAGNOSIS — H43813 Vitreous degeneration, bilateral: Secondary | ICD-10-CM | POA: Diagnosis not present

## 2019-05-27 DIAGNOSIS — H353222 Exudative age-related macular degeneration, left eye, with inactive choroidal neovascularization: Secondary | ICD-10-CM | POA: Diagnosis not present

## 2019-05-27 DIAGNOSIS — H35371 Puckering of macula, right eye: Secondary | ICD-10-CM | POA: Diagnosis not present

## 2019-05-27 DIAGNOSIS — H353111 Nonexudative age-related macular degeneration, right eye, early dry stage: Secondary | ICD-10-CM | POA: Diagnosis not present

## 2019-06-04 DIAGNOSIS — G4733 Obstructive sleep apnea (adult) (pediatric): Secondary | ICD-10-CM | POA: Diagnosis not present

## 2019-06-20 ENCOUNTER — Other Ambulatory Visit: Payer: Self-pay | Admitting: Gastroenterology

## 2019-06-23 ENCOUNTER — Other Ambulatory Visit: Payer: Self-pay | Admitting: Gastroenterology

## 2019-06-23 MED ORDER — ESOMEPRAZOLE MAGNESIUM 40 MG PO CPDR: 40.0000 mg | DELAYED_RELEASE_CAPSULE | Freq: Two times a day (BID) | ORAL | 0 refills | Status: DC

## 2019-06-23 NOTE — Telephone Encounter (Signed)
Patient called needing a refill

## 2019-06-23 NOTE — Telephone Encounter (Signed)
rx sent

## 2019-07-02 ENCOUNTER — Emergency Department (HOSPITAL_COMMUNITY)
Admission: EM | Admit: 2019-07-02 | Discharge: 2019-07-02 | Disposition: A | Discharge status: Home / Self Care | Payer: PPO | Attending: Emergency Medicine | Admitting: Emergency Medicine

## 2019-07-02 DIAGNOSIS — Z7982 Long term (current) use of aspirin: Secondary | ICD-10-CM | POA: Insufficient documentation

## 2019-07-02 DIAGNOSIS — I10 Essential (primary) hypertension: Secondary | ICD-10-CM | POA: Diagnosis not present

## 2019-07-02 DIAGNOSIS — Z87891 Personal history of nicotine dependence: Secondary | ICD-10-CM | POA: Diagnosis not present

## 2019-07-02 DIAGNOSIS — Z79899 Other long term (current) drug therapy: Secondary | ICD-10-CM | POA: Diagnosis not present

## 2019-07-02 DIAGNOSIS — R040 Epistaxis: Secondary | ICD-10-CM

## 2019-07-02 NOTE — ED Notes (Signed)
Patient verbalizes understanding of discharge instructions. Opportunity for questioning and answers were provided. Armband removed by staff, pt discharged from ED.  

## 2019-07-02 NOTE — ED Provider Notes (Signed)
[facility] Provider Note   CSN: [id number] Arrival date & time: 07/02/19  1003    History   Chief Complaint Chief Complaint  Patient presents with  . Epistaxis    HPI [facility] is a 78 y.o. male.     HPI   78 year old male presents today with complaints of epistaxis.  Patient notes that he was in the hospital visiting his wife.  He notes he was picking his nose when he caused a nosebleed.  He reports he applied direct pressure and noticed some running down the back of his throat thereafter.  He notes he spit up blood from that.  He notes the direct pressure stopped bleeding.  Nursing staff taking care of his wife told him to come to the emergency room for evaluation.  He denies any dizziness, chest pain shortness of breath, notes he did take his blood pressure medication this morning.  He takes daily aspirin no other anticoagulation.  Past Medical History:  Diagnosis Date  . Atrial fibrillation ([place])   . Bradycardia   . Bradycardia   . Cardiac murmur   . Dizziness   . Glaucoma   . Heart palpitations   . Hyperlipidemia   . Hypertension   . Skin abnormalities    pre cancerous lesion R hand  . Sleep apnea    uses cpap  . Snoring     Patient Active Problem List   Diagnosis Date Noted  . Obstructive sleep apnea 01/22/2019  . Gastroesophageal reflux disease 05/22/2018  . Sore throat 05/22/2018  . RLQ abdominal pain 03/15/2018  . Chest pain 01/29/2018  . OSA on CPAP 10/11/2015  . 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 Snoring   . ATRIAL FIBRILLATION 08/31/2009  . CARDIAC MURMUR 08/31/2009  . Elevated lipids 06/02/2009  . Essential hypertension 06/02/2009  . DIZZINESS 06/02/2009  . PALPITATIONS 06/02/2009    Past Surgical History:  Procedure Laterality Date  . APPENDECTOMY  1965  . COLONOSCOPY    . POLYPECTOMY    . TONSILLECTOMY     as a child        Home  Medications    Prior to Admission medications   Medication Sig Start Date End Date Taking? Authorizing Provider  Apoaequorin (PREVAGEN PO) Take 1 capsule by mouth as directed. [facility] and memory.    [provider]  aspirin 81 MG tablet Take 81 mg by mouth daily.     [provider]  atorvastatin (LIPITOR) 10 MG tablet Take 1 tablet (10 mg total) by mouth daily. 04/10/19   Josue Hector, MD  Coenzyme Q10 (CO Q 10 PO) Take 1 capsule by mouth daily.     [provider]  COSOPT PF 22.3-6.8 MG/ML SOLN Place 1 drop into both eyes 2 (two) times daily.  12/28/11   [provider]  Cranberry 400 MG CAPS Take 1 capsule by mouth daily.    [provider]  diltiazem (DILACOR XR) 240 MG 24 hr capsule Take 1 capsule (240 mg total) by mouth daily. 04/10/19   Josue Hector, MD  docusate sodium (COLACE) 100 MG capsule Take 200 mg by mouth daily.    [provider]  esomeprazole (NEXIUM) 40 MG capsule Take 1 capsule (40 mg total) by mouth 2 (two) times daily before a meal. Please call office for additional refills 06/23/19   Zehr, Janett Billow D, PA-C  fish oil-omega-3 fatty acids 1000 MG  capsule Take 1 g by mouth 2 (two) times daily.     [provider]  hydrochlorothiazide (HYDRODIURIL) 25 MG tablet Take 1 tablet (25 mg total) by mouth daily. 04/10/19   Josue Hector, MD  latanoprost (XALATAN) 0.005 % ophthalmic solution Place 1 drop into both eyes at bedtime.    [provider]  losartan (COZAAR) 100 MG tablet Take 1 tablet (100 mg total) by mouth daily. 04/10/19   Josue Hector, MD  Misc Natural Products [facility]) CAPS Take 1 capsule by mouth daily.    [provider]  multivitamin [facility]) per tablet Take 1 tablet by mouth daily.    [provider]  oxymetazoline (AFRIN) 0.05 % nasal spray Place 1 spray into both nostrils daily as needed for congestion.    [provider]  Pomegranate 250 MG CAPS  Take 1 capsule by mouth daily.    [provider]  Probiotic Product (ALIGN PO) Take by mouth. Take daily    [provider]  RHOPRESSA 0.02 % SOLN as directed. 01/08/18   [provider]  UNABLE TO FIND Place into the left eye. Med Name: Gomez Cleverly Shots. 1 shot every 10 weeks.    [provider]  valACYclovir (VALTREX) 1000 MG tablet Take 1,000 mg by mouth as directed.     [provider]    Family History Family History  Problem Relation Age of Onset  . Stroke Father   . Stroke Mother   . Stroke Sister   . Breast cancer Sister   . High blood pressure Sister   . Diabetes Sister   . Colon cancer Other        Uncle  . Colon cancer Paternal Uncle   . Mitral valve prolapse Other        sugery 09-27-2017  . Rectal cancer Neg Hx   . Stomach cancer Neg Hx     Social History Social History   Tobacco Use  . Smoking status: Former Smoker    Types: Cigarettes    Quit date: 12/18/1965    Years since quitting: 53.5  . Smokeless tobacco: Never Used  Substance Use Topics  . Alcohol use: Yes    Comment: 5-7 drinks per week  . Drug use: No     Allergies   Brimonidine tartrate-timolol   Review of Systems Review of Systems  All other systems reviewed and are negative.    Physical Exam Updated Vital Signs BP (!) 173/80 (BP Location: Right Arm)   Pulse 63   Temp 98.6 F (37 C) (Oral)   Resp 18   SpO2 98%   Physical Exam Vitals signs and nursing note reviewed.  Constitutional:      Appearance: He is well-developed.  HENT:     Head: Normocephalic and atraumatic.     Comments: Small lesion noted at the right anterior nasal septum, no active bleeding nares patent bilateral Eyes:     General: No scleral icterus.       Right eye: No discharge.        Left eye: No discharge.     Conjunctiva/sclera: Conjunctivae normal.     Pupils: Pupils are equal, round, and reactive to light.  Neck:     Musculoskeletal: Normal range of motion.      Vascular: No JVD.     Trachea: No tracheal deviation.  Pulmonary:     Effort: Pulmonary effort is normal.     Breath sounds: No stridor.  Neurological:  Mental Status: He is alert and oriented to person, place, and time.     Coordination: Coordination normal.  Psychiatric:        Behavior: Behavior normal.        Thought Content: Thought content normal.        Judgment: Judgment normal.      ED Treatments / Results  Labs (all labs ordered are listed, but only abnormal results are displayed) Labs Reviewed - No data to display  EKG None  Radiology No results found.  Procedures Procedures (including critical care time)  Medications Ordered in ED Medications - No data to display   Initial Impression / Assessment and Plan / ED Course  I have reviewed the triage vital signs and the nursing notes.  Pertinent labs & imaging results that were available during my care of the patient were reviewed by me and considered in my medical decision making (see chart for details).        78 year old male presents today with epistaxis.  This happened approximately 2 hours prior to arrival.  He has no rebleeding.  He does have a small lesion where he can tell he had caused trauma.  Patient has no other acute concerns here today.  He is stable and will be discharged with return precautions.  He verbalized understanding and agreement to today's plan had no further questions or concerns.  Final Clinical Impressions(s) / ED Diagnoses   Final diagnoses:  Epistaxis    ED Discharge Orders    None       Francee Gentile 07/02/19 1129    Long, Wonda Olds, MD 07/03/19 [phone]

## 2019-07-02 NOTE — ED Triage Notes (Addendum)
Pt is actually here in the hospital staying with his wife, pt states that he was picking his nose and it started bleeding , pt then coughed up a clot , the nurses wanted him to come get checked out in the Ed , no blood thinners , pt is NOT bleeding at this time

## 2019-07-02 NOTE — Discharge Instructions (Signed)
Please read attached information. If you experience any new or worsening signs or symptoms please return to the emergency room for evaluation. Please follow-up with your primary care provider or specialist as discussed. Please use medication prescribed only as directed and discontinue taking if you have any concerning signs or symptoms.   °

## 2019-07-23 DIAGNOSIS — G4733 Obstructive sleep apnea (adult) (pediatric): Secondary | ICD-10-CM | POA: Diagnosis not present

## 2019-08-07 ENCOUNTER — Other Ambulatory Visit: Payer: Self-pay | Admitting: Gastroenterology

## 2019-08-17 ENCOUNTER — Emergency Department (HOSPITAL_COMMUNITY): Payer: PPO

## 2019-08-17 ENCOUNTER — Observation Stay (HOSPITAL_COMMUNITY)
Admission: EM | Admit: 2019-08-17 | Discharge: 2019-08-18 | Disposition: A | Discharge status: Home / Self Care | Payer: PPO | Attending: Internal Medicine | Admitting: Internal Medicine

## 2019-08-17 ENCOUNTER — Other Ambulatory Visit: Payer: Self-pay

## 2019-08-17 ENCOUNTER — Encounter (HOSPITAL_COMMUNITY): Payer: Self-pay | Admitting: *Deleted

## 2019-08-17 DIAGNOSIS — R079 Chest pain, unspecified: Principal | ICD-10-CM | POA: Diagnosis present

## 2019-08-17 DIAGNOSIS — I723 Aneurysm of iliac artery: Secondary | ICD-10-CM | POA: Diagnosis not present

## 2019-08-17 DIAGNOSIS — I10 Essential (primary) hypertension: Secondary | ICD-10-CM | POA: Insufficient documentation

## 2019-08-17 DIAGNOSIS — G4733 Obstructive sleep apnea (adult) (pediatric): Secondary | ICD-10-CM | POA: Diagnosis not present

## 2019-08-17 DIAGNOSIS — R0789 Other chest pain: Secondary | ICD-10-CM | POA: Diagnosis not present

## 2019-08-17 DIAGNOSIS — I4891 Unspecified atrial fibrillation: Secondary | ICD-10-CM | POA: Insufficient documentation

## 2019-08-17 DIAGNOSIS — E785 Hyperlipidemia, unspecified: Secondary | ICD-10-CM | POA: Diagnosis not present

## 2019-08-17 DIAGNOSIS — Z79899 Other long term (current) drug therapy: Secondary | ICD-10-CM | POA: Insufficient documentation

## 2019-08-17 DIAGNOSIS — Z20828 Contact with and (suspected) exposure to other viral communicable diseases: Secondary | ICD-10-CM | POA: Diagnosis not present

## 2019-08-17 DIAGNOSIS — E876 Hypokalemia: Secondary | ICD-10-CM | POA: Diagnosis present

## 2019-08-17 DIAGNOSIS — Z7982 Long term (current) use of aspirin: Secondary | ICD-10-CM | POA: Insufficient documentation

## 2019-08-17 DIAGNOSIS — K219 Gastro-esophageal reflux disease without esophagitis: Secondary | ICD-10-CM | POA: Diagnosis not present

## 2019-08-17 LAB — BASIC METABOLIC PANEL
Anion gap: 12 (ref 5–15)
BUN: 19 mg/dL (ref 8–23)
CO2: 26 mmol/L (ref 22–32)
Calcium: 9.4 mg/dL (ref 8.9–10.3)
Chloride: 101 mmol/L (ref 98–111)
Creatinine, Ser: 0.92 mg/dL (ref 0.61–1.24)
GFR calc Af Amer: >60 mL/min (ref >60)
GFR calc non Af Amer: >60 mL/min (ref >60)
Glucose, Bld: 125 mg/dL — ABNORMAL HIGH (ref 70–99)
Potassium: 3.1 mmol/L — ABNORMAL LOW (ref 3.5–5.1)
Sodium: 139 mmol/L (ref 135–145)

## 2019-08-17 LAB — CBC
HCT: 44 % (ref 39.0–52.0)
Hemoglobin: 14.5 g/dL (ref 13.0–17.0)
MCH: 31.3 pg (ref 26.0–34.0)
MCHC: 33 g/dL (ref 30.0–36.0)
MCV: 94.8 fL (ref 80.0–100.0)
Platelets: 204 10*3/uL (ref 150–400)
RBC: 4.64 MIL/uL (ref 4.22–5.81)
RDW: 13.4 % (ref 11.5–15.5)
WBC: 8.3 10*3/uL (ref 4.0–10.5)
nRBC: 0 % (ref 0.0–0.2)

## 2019-08-17 LAB — TROPONIN I (HIGH SENSITIVITY): Troponin I (High Sensitivity): 10 ng/L (ref <18)

## 2019-08-17 MED ORDER — SODIUM CHLORIDE 0.9% FLUSH
3.0000 mL | Freq: Once | INTRAVENOUS | Status: AC
  Administered 2019-08-18: 04:00:00 3 mL via INTRAVENOUS

## 2019-08-17 NOTE — ED Triage Notes (Signed)
Pt reports that he had a sharp left lateral chest (non radiating) pain that started about 30 minutes ago, now subsided. Reports he took his BP at home and it was 170's/100's.

## 2019-08-18 ENCOUNTER — Emergency Department (HOSPITAL_COMMUNITY): Payer: PPO

## 2019-08-18 ENCOUNTER — Encounter (HOSPITAL_COMMUNITY): Payer: Self-pay | Admitting: Internal Medicine

## 2019-08-18 ENCOUNTER — Ambulatory Visit (HOSPITAL_BASED_OUTPATIENT_CLINIC_OR_DEPARTMENT_OTHER): Payer: PPO

## 2019-08-18 DIAGNOSIS — R072 Precordial pain: Secondary | ICD-10-CM | POA: Diagnosis not present

## 2019-08-18 DIAGNOSIS — R079 Chest pain, unspecified: Secondary | ICD-10-CM

## 2019-08-18 DIAGNOSIS — E876 Hypokalemia: Secondary | ICD-10-CM | POA: Diagnosis not present

## 2019-08-18 DIAGNOSIS — K219 Gastro-esophageal reflux disease without esophagitis: Secondary | ICD-10-CM | POA: Diagnosis not present

## 2019-08-18 DIAGNOSIS — I771 Stricture of artery: Secondary | ICD-10-CM | POA: Diagnosis not present

## 2019-08-18 DIAGNOSIS — R0789 Other chest pain: Secondary | ICD-10-CM | POA: Diagnosis not present

## 2019-08-18 DIAGNOSIS — I723 Aneurysm of iliac artery: Secondary | ICD-10-CM | POA: Diagnosis not present

## 2019-08-18 LAB — CBC
HCT: 44.6 % (ref 39.0–52.0)
Hemoglobin: 14.3 g/dL (ref 13.0–17.0)
MCH: 31 pg (ref 26.0–34.0)
MCHC: 32.1 g/dL (ref 30.0–36.0)
MCV: 96.5 fL (ref 80.0–100.0)
Platelets: 197 10*3/uL (ref 150–400)
RBC: 4.62 MIL/uL (ref 4.22–5.81)
RDW: 13.8 % (ref 11.5–15.5)
WBC: 7.9 10*3/uL (ref 4.0–10.5)
nRBC: 0 % (ref 0.0–0.2)

## 2019-08-18 LAB — LIPID PANEL
Cholesterol: 123 mg/dL (ref 0–200)
HDL: 37 mg/dL — ABNORMAL LOW (ref >40)
LDL Cholesterol: 75 mg/dL (ref 0–99)
Total CHOL/HDL Ratio: 3.3 RATIO
Triglycerides: 54 mg/dL (ref <150)
VLDL: 11 mg/dL (ref 0–40)

## 2019-08-18 LAB — COMPREHENSIVE METABOLIC PANEL
ALT: 19 U/L (ref 0–44)
AST: 23 U/L (ref 15–41)
Albumin: 3.4 g/dL — ABNORMAL LOW (ref 3.5–5.0)
Alkaline Phosphatase: 85 U/L (ref 38–126)
Anion gap: 11 (ref 5–15)
BUN: 17 mg/dL (ref 8–23)
CO2: 27 mmol/L (ref 22–32)
Calcium: 8.9 mg/dL (ref 8.9–10.3)
Chloride: 100 mmol/L (ref 98–111)
Creatinine, Ser: 0.96 mg/dL (ref 0.61–1.24)
GFR calc Af Amer: >60 mL/min (ref >60)
GFR calc non Af Amer: >60 mL/min (ref >60)
Glucose, Bld: 113 mg/dL — ABNORMAL HIGH (ref 70–99)
Potassium: 3.2 mmol/L — ABNORMAL LOW (ref 3.5–5.1)
Sodium: 138 mmol/L (ref 135–145)
Total Bilirubin: 0.7 mg/dL (ref 0.3–1.2)
Total Protein: 6.8 g/dL (ref 6.5–8.1)

## 2019-08-18 LAB — SARS CORONAVIRUS 2 BY RT PCR (HOSPITAL ORDER, PERFORMED IN ~~LOC~~ HOSPITAL LAB): SARS Coronavirus 2: NEGATIVE

## 2019-08-18 LAB — TROPONIN I (HIGH SENSITIVITY)
Troponin I (High Sensitivity): 10 ng/L (ref <18)
Troponin I (High Sensitivity): 11 ng/L (ref <18)

## 2019-08-18 LAB — MRSA PCR SCREENING: MRSA by PCR: NEGATIVE

## 2019-08-18 LAB — ECHOCARDIOGRAM COMPLETE

## 2019-08-18 MED ORDER — NETARSUDIL DIMESYLATE 0.02 % OP SOLN: 1.0000 [drp] | Freq: Every day | OPHTHALMIC | Status: DC

## 2019-08-18 MED ORDER — LIDOCAINE VISCOUS HCL 2 % MT SOLN
15.0000 mL | Freq: Once | OROMUCOSAL | Status: AC
  Administered 2019-08-18: 02:00:00 15 mL via ORAL
  Filled 2019-08-18: qty 15

## 2019-08-18 MED ORDER — ACETAMINOPHEN 325 MG PO TABS: 650.0000 mg | ORAL_TABLET | Freq: Four times a day (QID) | ORAL | Status: DC | PRN

## 2019-08-18 MED ORDER — ATORVASTATIN CALCIUM 10 MG PO TABS
10.0000 mg | ORAL_TABLET | Freq: Every day | ORAL | Status: DC
  Administered 2019-08-18: 11:00:00 10 mg via ORAL
  Filled 2019-08-18: qty 1

## 2019-08-18 MED ORDER — DORZOLAMIDE HCL-TIMOLOL MAL 2-0.5 % OP SOLN
1.0000 [drp] | Freq: Two times a day (BID) | OPHTHALMIC | Status: DC
  Administered 2019-08-18: 1 [drp] via OPHTHALMIC
  Filled 2019-08-18: qty 10

## 2019-08-18 MED ORDER — NITROGLYCERIN 0.4 MG SL SUBL
0.4000 mg | SUBLINGUAL_TABLET | SUBLINGUAL | Status: DC | PRN
  Filled 2019-08-18: qty 1

## 2019-08-18 MED ORDER — PANTOPRAZOLE SODIUM 40 MG PO TBEC
40.0000 mg | DELAYED_RELEASE_TABLET | Freq: Every day | ORAL | Status: DC
  Administered 2019-08-18: 11:00:00 40 mg via ORAL
  Filled 2019-08-18: qty 1

## 2019-08-18 MED ORDER — ACETAMINOPHEN 650 MG RE SUPP: 650.0000 mg | Freq: Four times a day (QID) | RECTAL | Status: DC | PRN

## 2019-08-18 MED ORDER — HYDROCHLOROTHIAZIDE 25 MG PO TABS
25.0000 mg | ORAL_TABLET | Freq: Every day | ORAL | Status: DC
  Administered 2019-08-18: 25 mg via ORAL
  Filled 2019-08-18: qty 1

## 2019-08-18 MED ORDER — NITROGLYCERIN 0.4 MG SL SUBL: 0.4000 mg | SUBLINGUAL_TABLET | SUBLINGUAL | 1 refills | Status: AC | PRN

## 2019-08-18 MED ORDER — ASPIRIN EC 81 MG PO TBEC
81.0000 mg | DELAYED_RELEASE_TABLET | Freq: Every day | ORAL | Status: DC
  Administered 2019-08-18: 81 mg via ORAL
  Filled 2019-08-18: qty 1

## 2019-08-18 MED ORDER — DORZOLAMIDE HCL-TIMOLOL MAL PF 22.3-6.8 MG/ML OP SOLN: 1.0000 [drp] | Freq: Two times a day (BID) | OPHTHALMIC | Status: DC

## 2019-08-18 MED ORDER — ALUM & MAG HYDROXIDE-SIMETH 200-200-20 MG/5ML PO SUSP
30.0000 mL | Freq: Once | ORAL | Status: AC
  Administered 2019-08-18: 30 mL via ORAL
  Filled 2019-08-18: qty 30

## 2019-08-18 MED ORDER — ALIGN PO CAPS: 1.0000 | ORAL_CAPSULE | Freq: Every day | ORAL | Status: DC

## 2019-08-18 MED ORDER — ASPIRIN 81 MG PO CHEW
324.0000 mg | CHEWABLE_TABLET | Freq: Once | ORAL | Status: AC
  Administered 2019-08-18: 02:00:00 324 mg via ORAL
  Filled 2019-08-18: qty 4

## 2019-08-18 MED ORDER — LOSARTAN POTASSIUM 50 MG PO TABS
100.0000 mg | ORAL_TABLET | Freq: Every day | ORAL | Status: DC
  Administered 2019-08-18: 100 mg via ORAL
  Filled 2019-08-18: qty 2

## 2019-08-18 MED ORDER — POTASSIUM CHLORIDE CRYS ER 10 MEQ PO TBCR
10.0000 meq | EXTENDED_RELEASE_TABLET | Freq: Every day | ORAL | Status: DC
  Administered 2019-08-18: 10 meq via ORAL
  Filled 2019-08-18: qty 1

## 2019-08-18 MED ORDER — ENOXAPARIN SODIUM 40 MG/0.4ML ~~LOC~~ SOLN: 40.0000 mg | Freq: Every day | SUBCUTANEOUS | Status: DC

## 2019-08-18 MED ORDER — POTASSIUM CHLORIDE IN NACL 20-0.9 MEQ/L-% IV SOLN
INTRAVENOUS | Status: DC
  Administered 2019-08-18: 04:00:00 via INTRAVENOUS
  Filled 2019-08-18: qty 1000

## 2019-08-18 MED ORDER — POTASSIUM CHLORIDE CRYS ER 20 MEQ PO TBCR
40.0000 meq | EXTENDED_RELEASE_TABLET | Freq: Once | ORAL | Status: AC
  Administered 2019-08-18: 40 meq via ORAL
  Filled 2019-08-18: qty 2

## 2019-08-18 MED ORDER — DOCUSATE SODIUM 100 MG PO CAPS
200.0000 mg | ORAL_CAPSULE | Freq: Every day | ORAL | Status: DC
  Administered 2019-08-18: 200 mg via ORAL
  Filled 2019-08-18: qty 2

## 2019-08-18 MED ORDER — LATANOPROST 0.005 % OP SOLN
1.0000 [drp] | Freq: Every day | OPHTHALMIC | Status: DC
  Filled 2019-08-18: qty 2.5

## 2019-08-18 MED ORDER — DILTIAZEM HCL ER 240 MG PO CP24
240.0000 mg | ORAL_CAPSULE | Freq: Every day | ORAL | Status: DC
  Administered 2019-08-18: 240 mg via ORAL
  Filled 2019-08-18 (×2): qty 1

## 2019-08-18 MED ORDER — IOHEXOL 350 MG/ML SOLN
100.0000 mL | Freq: Once | INTRAVENOUS | Status: AC | PRN
  Administered 2019-08-18: 03:00:00 100 mL via INTRAVENOUS

## 2019-08-18 NOTE — Discharge Instructions (Signed)

## 2019-08-18 NOTE — Progress Notes (Signed)
  Echocardiogram 2D Echocardiogram has been performed.  Jannett Celestine 08/18/2019, 10:43 AM

## 2019-08-18 NOTE — ED Notes (Signed)
ED TO INPATIENT HANDOFF REPORT  ED Nurse Name and Phone #: Celene Squibb RN  S Name/Age/Gender Jonathan Lane 78 y.o. male Room/Bed: 031C/031C  Code Status   Code Status: Full Code  Home/SNF/Other Home Patient oriented to: self, place, time and situation Is this baseline? Yes   Triage Complete: Triage complete  Chief Complaint high bp, chest  pain  Triage Note Pt reports that he had a sharp left lateral chest (non radiating) pain that started about 30 minutes ago, now subsided. Reports he took his BP at home and it was 170's/100's.    Allergies Allergies  Allergen Reactions  . Brimonidine Tartrate-Timolol Itching    Itching eyes Itching eyes, rash    Level of Care/Admitting Diagnosis ED Disposition    ED Disposition Condition [facility] Area: [place] [[id number]]  Level of Care: Progressive [102]  I expect the patient will be discharged within 24 hours: No (not a candidate for 5C-Observation unit)  Covid Evaluation: Asymptomatic Screening Protocol (No Symptoms)  Diagnosis: Chest pain [id number]  Admitting Physician: Jani Gravel [[id number]]  Attending Physician: Jani Gravel [[id number]]  PT Class (Do Not Modify): Observation [104]  PT Acc Code (Do Not Modify): Observation [10022]       B Medical/Surgery History Past Medical History:  Diagnosis Date  . Atrial fibrillation ([place])   . Bradycardia   . Bradycardia   . Cardiac murmur   . Dizziness   . Glaucoma   . Heart palpitations   . Hyperlipidemia   . Hypertension   . Skin abnormalities    pre cancerous lesion R hand  . Sleep apnea    uses cpap  . Snoring    Past Surgical History:  Procedure Laterality Date  . APPENDECTOMY  1965  . COLONOSCOPY    . POLYPECTOMY    . TONSILLECTOMY     as a child     A IV Location/Drains/Wounds Patient Lines/Drains/Airways Status   Active Line/Drains/Airways    Name:   Placement date:   Placement time:   Site:   Days:   Peripheral IV  08/18/19 Right Antecubital   08/18/19    0222    Antecubital   less than 1          Intake/Output Last 24 hours No intake or output data in the 24 hours ending 08/18/19 0802  Labs/Imaging Results for orders placed or performed during the hospital encounter of 08/17/19 (from the past 48 hour(s))  Basic metabolic panel     Status: Abnormal   Collection Time: 08/17/19  9:55 PM  Result Value Ref Range   Sodium 139 135 - 145 mmol/L   Potassium 3.1 (L) 3.5 - 5.1 mmol/L   Chloride 101 98 - 111 mmol/L   CO2 26 22 - 32 mmol/L   Glucose, Bld 125 (H) 70 - 99 mg/dL   BUN 19 8 - 23 mg/dL   Creatinine, Ser 0.92 0.61 - 1.24 mg/dL   Calcium 9.4 8.9 - 10.3 mg/dL   GFR calc non Af Amer >60 >60 mL/min   GFR calc Af Amer >60 >60 mL/min   Anion gap 12 5 - 15    Comment: Performed at [facility], [place] [address], [place] [address]  CBC     Status: None   Collection Time: 08/17/19  9:55 PM  Result Value Ref Range   WBC 8.3 4.0 - 10.5 K/uL   RBC 4.64 4.22 - 5.81 MIL/uL  Hemoglobin 14.5 13.0 - 17.0 g/dL   HCT 44.0 39.0 - 52.0 %   MCV 94.8 80.0 - 100.0 fL   MCH 31.3 26.0 - 34.0 pg   MCHC 33.0 30.0 - 36.0 g/dL   RDW 13.4 11.5 - 15.5 %   Platelets 204 150 - 400 K/uL   nRBC 0.0 0.0 - 0.2 %    Comment: Performed at [facility], [place] [address], [place], [place] [address]  Troponin I (High Sensitivity)     Status: None   Collection Time: 08/17/19  9:55 PM  Result Value Ref Range   Troponin I (High Sensitivity) 10 <18 ng/L    Comment: (NOTE) Elevated high sensitivity troponin I (hsTnI) values and significant  changes across serial measurements may suggest ACS but many other  chronic and acute conditions are known to elevate hsTnI results.  Refer to the "Links" section for chest pain algorithms and additional  guidance. Performed at [facility], [place] [address] [place], [place] [address]   Troponin I (High Sensitivity)     Status: None   Collection Time: 08/18/19   1:27 AM  Result Value Ref Range   Troponin I (High Sensitivity) 10 <18 ng/L    Comment: (NOTE) Elevated high sensitivity troponin I (hsTnI) values and significant  changes across serial measurements may suggest ACS but many other  chronic and acute conditions are known to elevate hsTnI results.  Refer to the "Links" section for chest pain algorithms and additional  guidance. Performed at [facility], [place] [address] [place], [place] [address]   SARS Coronavirus 2 [facility] order, Performed in [facility] hospital lab) Nasopharyngeal Nasopharyngeal Swab     Status: None   Collection Time: 08/18/19  3:24 AM   Specimen: Nasopharyngeal Swab  Result Value Ref Range   SARS Coronavirus 2 NEGATIVE NEGATIVE    Comment: (NOTE) If result is NEGATIVE SARS-CoV-2 target nucleic acids are NOT DETECTED. The SARS-CoV-2 RNA is generally detectable in upper and lower  respiratory specimens during the acute phase of infection. The lowest  concentration of SARS-CoV-2 viral copies this assay can detect is 250  copies / mL. A negative result does not preclude SARS-CoV-2 infection  and should not be used as the sole basis for treatment or other  patient management decisions.  A negative result may occur with  improper specimen collection / handling, submission of specimen other  than nasopharyngeal swab, presence of viral mutation(s) within the  areas targeted by this assay, and inadequate number of viral copies  (<250 copies / mL). A negative result must be combined with clinical  observations, patient history, and epidemiological information. If result is POSITIVE SARS-CoV-2 target nucleic acids are DETECTED. The SARS-CoV-2 RNA is generally detectable in upper and lower  respiratory specimens dur ing the acute phase of infection.  Positive  results are indicative of active infection with SARS-CoV-2.  Clinical  correlation with patient history and other diagnostic information is  necessary to  determine patient infection status.  Positive results do  not rule out bacterial infection or co-infection with other viruses. If result is PRESUMPTIVE POSTIVE SARS-CoV-2 nucleic acids MAY BE PRESENT.   A presumptive positive result was obtained on the submitted specimen  and confirmed on repeat testing.  While 2019 novel coronavirus  (SARS-CoV-2) nucleic acids may be present in the submitted sample  additional confirmatory testing may be necessary for epidemiological  and / or clinical management purposes  to differentiate between  SARS-CoV-2 and other Sarbecovirus currently known to infect humans.  If clinically indicated additional testing with an alternate test  methodology [id number]) is advised. The SARS-CoV-2 RNA is generally  detectable in upper and lower respiratory sp ecimens during the acute  phase of infection. The expected result is Negative. Fact Sheet for Patients:  StrictlyIdeas.no Fact Sheet for Healthcare Providers: BankingDealers.co.za This test is not yet approved or cleared by the [place] FDA and has been authorized for detection and/or diagnosis of SARS-CoV-2 by FDA under an Emergency Use Authorization (EUA).  This EUA will remain in effect (meaning this test can be used) for the duration of the COVID-19 declaration under Section 564(b)(1) of the Act, 21 U.S.C. section 360bbb-3(b)(1), unless the authorization is terminated or revoked sooner. Performed at [facility], [place] [address], [place], [place] [address]   Comprehensive metabolic panel     Status: Abnormal   Collection Time: 08/18/19  3:24 AM  Result Value Ref Range   Sodium 138 135 - 145 mmol/L   Potassium 3.2 (L) 3.5 - 5.1 mmol/L   Chloride 100 98 - 111 mmol/L   CO2 27 22 - 32 mmol/L   Glucose, Bld 113 (H) 70 - 99 mg/dL   BUN 17 8 - 23 mg/dL   Creatinine, Ser 0.96 0.61 - 1.24 mg/dL   Calcium 8.9 8.9 - 10.3 mg/dL   Total Protein 6.8 6.5 - 8.1  g/dL   Albumin 3.4 (L) 3.5 - 5.0 g/dL   AST 23 15 - 41 U/L   ALT 19 0 - 44 U/L   Alkaline Phosphatase 85 38 - 126 U/L   Total Bilirubin 0.7 0.3 - 1.2 mg/dL   GFR calc non Af Amer >60 >60 mL/min   GFR calc Af Amer >60 >60 mL/min   Anion gap 11 5 - 15    Comment: Performed at [facility], [place] [address], [place], [place] [address]  Troponin I (High Sensitivity)     Status: None   Collection Time: 08/18/19  3:24 AM  Result Value Ref Range   Troponin I (High Sensitivity) 11 <18 ng/L    Comment: (NOTE) Elevated high sensitivity troponin I (hsTnI) values and significant  changes across serial measurements may suggest ACS but many other  chronic and acute conditions are known to elevate hsTnI results.  Refer to the "Links" section for chest pain algorithms and additional  guidance. Performed at [facility], [place] [address], [place], [place] [address]   CBC     Status: None   Collection Time: 08/18/19  5:50 AM  Result Value Ref Range   WBC 7.9 4.0 - 10.5 K/uL   RBC 4.62 4.22 - 5.81 MIL/uL   Hemoglobin 14.3 13.0 - 17.0 g/dL   HCT 44.6 39.0 - 52.0 %   MCV 96.5 80.0 - 100.0 fL   MCH 31.0 26.0 - 34.0 pg   MCHC 32.1 30.0 - 36.0 g/dL   RDW 13.8 11.5 - 15.5 %   Platelets 197 150 - 400 K/uL   nRBC 0.0 0.0 - 0.2 %    Comment: Performed at [facility], [place] [address] [place], [place] [address]  Lipid panel     Status: Abnormal   Collection Time: 08/18/19  5:50 AM  Result Value Ref Range   Cholesterol 123 0 - 200 mg/dL   Triglycerides 54 <150 mg/dL   HDL 37 (L) >40 mg/dL   Total CHOL/HDL Ratio 3.3 RATIO  VLDL 11 0 - 40 mg/dL   LDL Cholesterol 75 0 - 99 mg/dL    Comment:        Total Cholesterol/HDL:CHD Risk Coronary Heart Disease Risk Table                     Men   Women  1/2 Average Risk   3.4   3.3  Average Risk       5.0   4.4  2 X Average Risk   9.6   7.1  3 X Average Risk  23.4   11.0        Use the calculated Patient Ratio above and the CHD Risk  Table to determine the patient's CHD Risk.        ATP III CLASSIFICATION (LDL):  <100     mg/dL   Optimal  100-129  mg/dL   Near or Above                    Optimal  130-159  mg/dL   Borderline  160-189  mg/dL   High  >190     mg/dL   Very High Performed at [facility] [address], [place], [place] [address]    Dg Chest 2 View  Result Date: 08/17/2019 CLINICAL DATA:  Chest pain EXAM: CHEST - 2 VIEW COMPARISON:  02/26/2018 FINDINGS: The cardiomediastinal contours are unchanged. The lungs are clear. Pulmonary vasculature is normal. No consolidation, pleural effusion, or pneumothorax. No acute osseous abnormalities are seen. IMPRESSION: No acute chest findings. Electronically Signed   By: Keith Rake M.D.   On: 08/17/2019 22:33   Ct Angio Chest/abd/pel For Dissection W And/or Wo Contrast  Result Date: 08/18/2019 CLINICAL DATA:  Initial evaluation for acute chest pain. EXAM: CT ANGIOGRAPHY CHEST, ABDOMEN AND PELVIS TECHNIQUE: Multidetector CT imaging through the chest, abdomen and pelvis was performed using the standard protocol during bolus administration of intravenous contrast. Multiplanar reconstructed images and MIPs were obtained and reviewed to evaluate the vascular anatomy. CONTRAST:  149mL OMNIPAQUE IOHEXOL 350 MG/ML SOLN COMPARISON:  Prior radiograph from earlier same day. FINDINGS: CTA CHEST FINDINGS Cardiovascular: Precontrast imaging through the intrathoracic aorta demonstrates no mural thrombus or other acute abnormality. Mild to moderate scattered atheromatous plaque within the aortic arch. Postcontrast imaging demonstrates no evidence for dissection or other acute aortic pathology. Partially visualized great vessels intact. Heart size normal. Scattered 3 vessel coronary artery calcifications, most notable in the LAD. No pericardial effusion. Limited assessment of the pulmonary arterial tree grossly unremarkable. Mediastinum/Nodes: Thyroid within normal limits. No  pathologically enlarged mediastinal, hilar, or axillary lymph nodes. Esophagus within normal limits. Lungs/Pleura: Tracheobronchial tree intact and patent. Lungs well inflated. No focal infiltrates. No pulmonary edema or pleural effusion. No pneumothorax. No worrisome pulmonary nodule or mass. Musculoskeletal: External soft tissues demonstrate no acute finding. No acute osseous abnormality. No discrete lytic or blastic osseous lesions. Review of the MIP images confirms the above findings. CTA ABDOMEN AND PELVIS FINDINGS VASCULAR Aorta: Normal intravascular enhancement seen throughout the intra-abdominal aorta without evidence for dissection or other acute abnormality. Moderate aorto bi-iliac atherosclerotic disease. No aneurysm. Celiac: Celiac axis and its branch vessels are widely patent and well opacified. SMA: SMA widely patent without acute abnormality. Renals: Single right renal artery, with 2 left-sided renal arteries, all of which are widely patent without abnormality. IMA: IMA widely patent to its distal aspect. Inflow: Moderate atherosclerotic change present throughout the common and proximal external and  internal iliac arteries bilaterally. 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 Superimposed penetrating atheromatous ulcer at the proximal right common iliac (series 7, image 244). Iliac arteries otherwise widely patent without acute abnormality. Veins: No appreciable venous abnormality allowing for timing of the contrast bolus. Review of the MIP images confirms the above findings. NON-VASCULAR Hepatobiliary: Liver demonstrates a normal contrast enhanced appearance. Cholelithiasis without evidence for acute cholecystitis. No biliary dilatation. Pancreas: Pancreas within normal limits. Spleen: Spleen within normal limits. Adrenals/Urinary Tract: Adrenal glands are normal. Kidneys equal in size with symmetric enhancement. 9 mm nonobstructive stone present within the lower pole  the left kidney. No other radiopaque calculi. No hydronephrosis or focal enhancing renal mass. No hydroureter. Partially distended bladder within normal limits. Stomach/Bowel: Stomach within normal limits. No evidence for bowel obstruction. Appendix not visualize, consistent with history of prior appendectomy. Sigmoid diverticulosis without evidence for acute diverticulitis. No acute inflammatory changes seen about the bowels. Lymphatic: No pathologically enlarged intra-abdominopelvic lymph nodes. Reproductive: Prostate mildly enlarged measuring 5.3 cm in transverse diameter. Other: No free air or fluid. Musculoskeletal: No acute osseous abnormality. No discrete lytic or blastic osseous lesions. Multilevel facet arthropathy noted within the lower lumbar spine. Review of the MIP images confirms the above findings. IMPRESSION: 1. No CT evidence for acute aortic dissection or other acute aortic pathology. 2. 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 3. Moderate aorto bi-iliac atherosclerotic disease, with scattered 3 vessel coronary artery calcifications. 4. Cholelithiasis. 5. Sigmoid diverticulosis without evidence for acute diverticulitis. 6. Nonobstructive left renal nephrolithiasis. Electronically Signed   By: Jeannine Boga M.D.   On: 08/18/2019 03:29    Pending Labs Unresulted Labs (From admission, onward)    Start     Ordered   08/25/19 0500  Creatinine, serum  (enoxaparin (LOVENOX)    CrCl >/= 30 ml/min)  Weekly,   R    Comments: while on enoxaparin therapy    08/18/19 0305          Vitals/Pain Today's Vitals   08/18/19 0630 08/18/19 0645 08/18/19 0730 08/18/19 0800  BP:   (!) 155/78 (!) 151/79  Pulse: (!) 56 60    Resp: 16 14 17 19   Temp:      TempSrc:      SpO2: 95% 95%    PainSc:        Isolation Precautions No active isolations  Medications Medications  nitroGLYCERIN (NITROSTAT) SL  tablet 0.4 mg (has no administration in time range)  enoxaparin (LOVENOX) injection 40 mg (has no administration in time range)  acetaminophen (TYLENOL) tablet 650 mg (has no administration in time range)    Or  acetaminophen (TYLENOL) suppository 650 mg (has no administration in time range)  0.9 % NaCl with KCl 20 mEq/ L  infusion ( Intravenous New Bag/Given 08/18/19 0404)  sodium chloride flush (NS) 0.9 % injection 3 mL (3 mLs Intravenous Given 08/18/19 0405)  aspirin chewable tablet 324 mg (324 mg Oral Given 08/18/19 0144)  alum & mag hydroxide-simeth (MAALOX/MYLANTA) 200-200-20 MG/5ML suspension 30 mL (30 mLs Oral Given 08/18/19 0210)    And  lidocaine (XYLOCAINE) 2 % viscous mouth solution 15 mL (15 mLs Oral Given 08/18/19 0211)  potassium chloride SA (K-DUR) CR tablet 40 mEq (40 mEq Oral Given 08/18/19 0141)  iohexol (OMNIPAQUE) 350 MG/ML injection 100 mL (100 mLs Intravenous Contrast Given 08/18/19 0242)    Mobility walks Low  fall risk   Focused Assessments Cardiac Assessment Handoff:  Cardiac Rhythm: Normal sinus rhythm No results found for: CKTOTAL, CKMB, CKMBINDEX, TROPONINI No results found for: DDIMER Does the Patient currently have chest pain? No      R Recommendations: See Admitting Provider Note  Report given to:   Additional Notes:

## 2019-08-18 NOTE — H&P (Addendum)
TRH H&P    Patient Demographics:    Jonathan Lane, is a 78 y.o. male  MRN: [id number]  DOB - [birth year]  Admit Date - 08/17/2019  Referring MD/NP/PA: Rancour  Outpatient Primary MD for the patient is Darcus Austin, MD (Inactive)  Patient coming from:  home  Chief complaint- chest pain    HPI:    Jonathan Lane  is a 78 y.o. male,   w hypertension, hyperlipidemia, Pafib , OSA, presents with c/o chest pain left sided, "sharp" and "tightness",. Without radiation. Pt denies fever, chills, cough, palp, sob, n/v, abd pain, diarrhea, brbpr. Pt states that the pain started after he got home from a mile walk.  Took the walk about 7pm last nite.  Pt states pain started about 30 minutes before arrival to ED.  bp at home elevated 170/100.  Pt states that pain gradually subsided after 2 hours of being in the ER.    In ED,  T 98.7, P 82  R 16, Bp 169/101 pox 97% on RA  CXR IMPRESSION: No acute chest findings.  Na 139, K 3.1,  Bun 19, Creatinine 0.92 Wbc 8.3, Hgb 14.5, Plt 204 Trop 10   Ekg :  nsr at 65, nl axis, nl int, q in v1, (old), no significant change from prior ekg in 2019  Pt will be admitted for w/up of chest pain    Review of systems:    In addition to the HPI above,  No Fever-chills, No Headache, No changes with Vision or hearing, No problems swallowing food or Liquids, No Cough or Shortness of Breath, No Abdominal pain, No Nausea or Vomiting, bowel movements are regular, No Blood in stool or Urine, No dysuria, No new skin rashes or bruises, No new joints pains-aches,  No new weakness, tingling, numbness in any extremity, No recent weight gain or loss, No polyuria, polydypsia or polyphagia, No significant Mental Stressors.  All other systems reviewed and are negative.    Past History of the following :    Past Medical History:  Diagnosis Date  . Atrial fibrillation ([place])    . Bradycardia   . Bradycardia   . Cardiac murmur   . Dizziness   . Glaucoma   . Heart palpitations   . Hyperlipidemia   . Hypertension   . Skin abnormalities    pre cancerous lesion R hand  . Sleep apnea    uses cpap  . Snoring       Past Surgical History:  Procedure Laterality Date  . APPENDECTOMY  1965  . COLONOSCOPY    . POLYPECTOMY    . TONSILLECTOMY     as a child      Social History:      Social History   Tobacco Use  . Smoking status: Former Smoker    Types: Cigarettes    Quit date: 12/18/1965    Years since quitting: 53.7  . Smokeless tobacco: Never Used  Substance Use Topics  . Alcohol use: Yes    Comment: 5-7 drinks per week  Family History :     Family History  Problem Relation Age of Onset  . Stroke Father   . Stroke Mother   . Stroke Sister   . Breast cancer Sister   . High blood pressure Sister   . Diabetes Sister   . Colon cancer Other        Uncle  . Colon cancer Paternal Uncle   . Mitral valve prolapse Other        sugery 09-27-2017  . Rectal cancer Neg Hx   . Stomach cancer Neg Hx        Home Medications:   Prior to Admission medications   Medication Sig Start Date End Date Taking? Authorizing Provider  Apoaequorin (PREVAGEN PO) Take 1 capsule by mouth as directed. [facility] and memory.    [provider]  aspirin 81 MG tablet Take 81 mg by mouth daily.     [provider]  atorvastatin (LIPITOR) 10 MG tablet Take 1 tablet (10 mg total) by mouth daily. 04/10/19   Josue Hector, MD  Coenzyme Q10 (CO Q 10 PO) Take 1 capsule by mouth daily.     [provider]  COSOPT PF 22.3-6.8 MG/ML SOLN Place 1 drop into both eyes 2 (two) times daily.  12/28/11   [provider]  Cranberry 400 MG CAPS Take 1 capsule by mouth daily.    [provider]  diltiazem (DILACOR XR) 240 MG 24 hr capsule Take 1 capsule (240 mg total) by mouth daily. 04/10/19   Josue Hector, MD  docusate sodium  (COLACE) 100 MG capsule Take 200 mg by mouth daily.    [provider]  esomeprazole (NEXIUM) 40 MG capsule TAKE 1 CAPSULE TWICE DAILY BEFORE MEALS 08/07/19   Ladene Artist, MD  fish oil-omega-3 fatty acids 1000 MG capsule Take 1 g by mouth 2 (two) times daily.     [provider]  hydrochlorothiazide (HYDRODIURIL) 25 MG tablet Take 1 tablet (25 mg total) by mouth daily. 04/10/19   Josue Hector, MD  latanoprost (XALATAN) 0.005 % ophthalmic solution Place 1 drop into both eyes at bedtime.    [provider]  losartan (COZAAR) 100 MG tablet Take 1 tablet (100 mg total) by mouth daily. 04/10/19   Josue Hector, MD  Misc Natural Products [facility]) CAPS Take 1 capsule by mouth daily.    [provider]  multivitamin [facility]) per tablet Take 1 tablet by mouth daily.    [provider]  oxymetazoline (AFRIN) 0.05 % nasal spray Place 1 spray into both nostrils daily as needed for congestion.    [provider]  Pomegranate 250 MG CAPS Take 1 capsule by mouth daily.    [provider]  Probiotic Product (ALIGN PO) Take by mouth. Take daily    [provider]  RHOPRESSA 0.02 % SOLN as directed. 01/08/18   [provider]  UNABLE TO FIND Place into the left eye. Med Name: Gomez Cleverly Shots. 1 shot every 10 weeks.    [provider]  valACYclovir (VALTREX) 1000 MG tablet Take 1,000 mg by mouth as directed.     [provider]     Allergies:     Allergies  Allergen Reactions  . Brimonidine Tartrate-Timolol Itching    Itching eyes Itching eyes, rash     Physical Exam:   Vitals  Blood pressure (!) 149/82, pulse 60, temperature 98.7 F (37.1 C), temperature source Oral, resp. rate 18,  SpO2 96 %.  1.  General: axoxo3  2. Psychiatric: euthymic  3. Neurologic: cn2-12 intact, reflexes 2+ symmetric, diffuse with no clonus, motor 5/5 in all 4 ext  4. HEENMT:  Anicteric, pupils 1.74mm  symmetric, direct, consensual, near intact Neck: no jvd, no bruit  5. Respiratory : CTAB  6. Cardiovascular : rrr s1, s2,   7. Gastrointestinal:  Abd: soft, nt, nd, +bs  8. Skin:  Ext: no c/c/e,  No rash  9.Musculoskeletal:  Good ROM    Data Review:    CBC Recent Labs  Lab 08/17/19 2155  WBC 8.3  HGB 14.5  HCT 44.0  PLT 204  MCV 94.8  MCH 31.3  MCHC 33.0  RDW 13.4   ------------------------------------------------------------------------------------------------------------------  Results for orders placed or performed during the hospital encounter of 08/17/19 (from the past 48 hour(s))  Basic metabolic panel     Status: Abnormal   Collection Time: 08/17/19  9:55 PM  Result Value Ref Range   Sodium 139 135 - 145 mmol/L   Potassium 3.1 (L) 3.5 - 5.1 mmol/L   Chloride 101 98 - 111 mmol/L   CO2 26 22 - 32 mmol/L   Glucose, Bld 125 (H) 70 - 99 mg/dL   BUN 19 8 - 23 mg/dL   Creatinine, Ser 0.92 0.61 - 1.24 mg/dL   Calcium 9.4 8.9 - 10.3 mg/dL   GFR calc non Af Amer >60 >60 mL/min   GFR calc Af Amer >60 >60 mL/min   Anion gap 12 5 - 15    Comment: Performed at [facility], [place] [address] [place], [place] [address]  CBC     Status: None   Collection Time: 08/17/19  9:55 PM  Result Value Ref Range   WBC 8.3 4.0 - 10.5 K/uL   RBC 4.64 4.22 - 5.81 MIL/uL   Hemoglobin 14.5 13.0 - 17.0 g/dL   HCT 44.0 39.0 - 52.0 %   MCV 94.8 80.0 - 100.0 fL   MCH 31.3 26.0 - 34.0 pg   MCHC 33.0 30.0 - 36.0 g/dL   RDW 13.4 11.5 - 15.5 %   Platelets 204 150 - 400 K/uL   nRBC 0.0 0.0 - 0.2 %    Comment: Performed at [facility], [place] [address], [place], [place] [address]  Troponin I (High Sensitivity)     Status: None   Collection Time: 08/17/19  9:55 PM  Result Value Ref Range   Troponin I (High Sensitivity) 10 <18 ng/L    Comment: (NOTE) Elevated high sensitivity troponin I (hsTnI) values and significant  changes across serial measurements may suggest ACS but  many other  chronic and acute conditions are known to elevate hsTnI results.  Refer to the "Links" section for chest pain algorithms and additional  guidance. Performed at [facility], [place] [address], [place], [place] [address]   Troponin I (High Sensitivity)     Status: None   Collection Time: 08/18/19  1:27 AM  Result Value Ref Range   Troponin I (High Sensitivity) 10 <18 ng/L    Comment: (NOTE) Elevated high sensitivity troponin I (hsTnI) values and significant  changes across serial measurements may suggest ACS but many other  chronic and acute conditions are known to elevate hsTnI results.  Refer to the "Links" section for chest pain algorithms and additional  guidance. Performed at [facility], [place] [address] [place], [place] [address]     Chemistries  Recent Labs  Lab 08/17/19 2155  NA 139  K 3.1*  CL 101  CO2 26  GLUCOSE 125*  BUN 19  CREATININE 0.92  CALCIUM 9.4   ------------------------------------------------------------------------------------------------------------------  ------------------------------------------------------------------------------------------------------------------ GFR: CrCl cannot be calculated (Unknown ideal weight.). Liver Function Tests: No results for input(s): AST, ALT, ALKPHOS, BILITOT, PROT, ALBUMIN in the last 168 hours. No results for input(s): LIPASE, AMYLASE in the last 168 hours. No results for input(s): AMMONIA in the last 168 hours. Coagulation Profile: No results for input(s): INR, PROTIME in the last 168 hours. Cardiac Enzymes: No results for input(s): CKTOTAL, CKMB, CKMBINDEX, TROPONINI in the last 168 hours. BNP (last 3 results) No results for input(s): PROBNP in the last 8760 hours. HbA1C: No results for input(s): HGBA1C in the last 72 hours. CBG: No results for input(s): GLUCAP in the last 168 hours. Lipid Profile: No results for input(s): CHOL, HDL, LDLCALC, TRIG, CHOLHDL, LDLDIRECT in the  last 72 hours. Thyroid Function Tests: No results for input(s): TSH, T4TOTAL, FREET4, T3FREE, THYROIDAB in the last 72 hours. Anemia Panel: No results for input(s): VITAMINB12, FOLATE, FERRITIN, TIBC, IRON, RETICCTPCT in the last 72 hours.  --------------------------------------------------------------------------------------------------------------- Urine analysis: No results found for: COLORURINE, APPEARANCEUR, LABSPEC, PHURINE, GLUCOSEU, HGBUR, BILIRUBINUR, KETONESUR, PROTEINUR, UROBILINOGEN, NITRITE, LEUKOCYTESUR    Imaging Results:    Dg Chest 2 View  Result Date: 08/17/2019 CLINICAL DATA:  Chest pain EXAM: CHEST - 2 VIEW COMPARISON:  02/26/2018 FINDINGS: The cardiomediastinal contours are unchanged. The lungs are clear. Pulmonary vasculature is normal. No consolidation, pleural effusion, or pneumothorax. No acute osseous abnormalities are seen. IMPRESSION: No acute chest findings. Electronically Signed   By: Keith Rake M.D.   On: 08/17/2019 22:33       Assessment & Plan:    Principal Problem:   Chest pain Active Problems:   Gastroesophageal reflux disease   Hypokalemia  Chest pain Tele Trop I  Check lipid Check cardiac echo NPO Cardiology consult sent via message to [facility] to provide input on further testing, appreciate input   Hypokalemia Replete Check cmp in am  Hypertension, Pafib Cont Losartan 100mg  po qday Cont Hydrochlorothiazide 25mg  po qday Cont Cardizem XR 240mg  po qday Cont Asprin 81mg  po qday  Hyperlipidemia Cont Lipitor 10mg  po qhs  Gerd Cont PPI  Glaucoma Cont eye drops   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  Code Status:  FULL CODE<  Note wife just passed recently .   Admission status: Observation: Based on patients clinical presentation and evaluation of above clinical data, I have made determination that patient meets observation criteria at this time.  Time spent in minutes : 55    Jani Gravel M.D on 08/18/2019 at 3:08 AM

## 2019-08-18 NOTE — ED Notes (Signed)
ED TO INPATIENT HANDOFF REPORT  ED Nurse Name and Phone #:  Patty [phone]  S Name/Age/Gender Lucille Passy Mccarn 78 y.o. male Room/Bed: 031C/031C  Code Status   Code Status: Full Code  Home/SNF/Other Home Patient oriented to: self, place, time and situation Is this baseline? Yes   Triage Complete: Triage complete  Chief Complaint high bp, chest  pain  Triage Note Pt reports that he had a sharp left lateral chest (non radiating) pain that started about 30 minutes ago, now subsided. Reports he took his BP at home and it was 170's/100's.    Allergies Allergies  Allergen Reactions  . Brimonidine Tartrate-Timolol Itching    Itching eyes Itching eyes, rash    Level of Care/Admitting Diagnosis ED Disposition    ED Disposition Condition [facility] Area: [place] [100100]  Level of Care: Progressive [102]  I expect the patient will be discharged within 24 hours: No (not a candidate for 5C-Observation unit)  Covid Evaluation: Asymptomatic Screening Protocol (No Symptoms)  Diagnosis: Chest pain [id number]  Admitting Physician: Jani Gravel [[id number]]  Attending Physician: Jani Gravel [[id number]]  PT Class (Do Not Modify): Observation [104]  PT Acc Code (Do Not Modify): Observation [10022]       B Medical/Surgery History Past Medical History:  Diagnosis Date  . Atrial fibrillation ([place])   . Bradycardia   . Bradycardia   . Cardiac murmur   . Dizziness   . Glaucoma   . Heart palpitations   . Hyperlipidemia   . Hypertension   . Skin abnormalities    pre cancerous lesion R hand  . Sleep apnea    uses cpap  . Snoring    Past Surgical History:  Procedure Laterality Date  . APPENDECTOMY  1965  . COLONOSCOPY    . POLYPECTOMY    . TONSILLECTOMY     as a child     A IV Location/Drains/Wounds Patient Lines/Drains/Airways Status   Active Line/Drains/Airways    Name:   Placement date:   Placement time:   Site:   Days:   Peripheral IV  08/18/19 Right Antecubital   08/18/19    0222    Antecubital   less than 1          Intake/Output Last 24 hours No intake or output data in the 24 hours ending 08/18/19 [id number]  Labs/Imaging Results for orders placed or performed during the hospital encounter of 08/17/19 (from the past 48 hour(s))  Basic metabolic panel     Status: Abnormal   Collection Time: 08/17/19  9:55 PM  Result Value Ref Range   Sodium 139 135 - 145 mmol/L   Potassium 3.1 (L) 3.5 - 5.1 mmol/L   Chloride 101 98 - 111 mmol/L   CO2 26 22 - 32 mmol/L   Glucose, Bld 125 (H) 70 - 99 mg/dL   BUN 19 8 - 23 mg/dL   Creatinine, Ser 0.92 0.61 - 1.24 mg/dL   Calcium 9.4 8.9 - 10.3 mg/dL   GFR calc non Af Amer >60 >60 mL/min   GFR calc Af Amer >60 >60 mL/min   Anion gap 12 5 - 15    Comment: Performed at [facility], [place] [address] [place] [address]  CBC     Status: None   Collection Time: 08/17/19  9:55 PM  Result Value Ref Range   WBC 8.3 4.0 - 10.5 K/uL   RBC 4.64 4.22 - 5.81 MIL/uL  Hemoglobin 14.5 13.0 - 17.0 g/dL   HCT 44.0 39.0 - 52.0 %   MCV 94.8 80.0 - 100.0 fL   MCH 31.3 26.0 - 34.0 pg   MCHC 33.0 30.0 - 36.0 g/dL   RDW 13.4 11.5 - 15.5 %   Platelets 204 150 - 400 K/uL   nRBC 0.0 0.0 - 0.2 %    Comment: Performed at [facility], [place] [address] [place], [place] [address]  Troponin I (High Sensitivity)     Status: None   Collection Time: 08/17/19  9:55 PM  Result Value Ref Range   Troponin I (High Sensitivity) 10 <18 ng/L    Comment: (NOTE) Elevated high sensitivity troponin I (hsTnI) values and significant  changes across serial measurements may suggest ACS but many other  chronic and acute conditions are known to elevate hsTnI results.  Refer to the "Links" section for chest pain algorithms and additional  guidance. Performed at [facility], [place] [address] [place], [place] [address]   Troponin I (High Sensitivity)     Status: None   Collection Time: 08/18/19   1:27 AM  Result Value Ref Range   Troponin I (High Sensitivity) 10 <18 ng/L    Comment: (NOTE) Elevated high sensitivity troponin I (hsTnI) values and significant  changes across serial measurements may suggest ACS but many other  chronic and acute conditions are known to elevate hsTnI results.  Refer to the "Links" section for chest pain algorithms and additional  guidance. Performed at [facility], [place] [address] [place], [place] [address]    Dg Chest 2 View  Result Date: 08/17/2019 CLINICAL DATA:  Chest pain EXAM: CHEST - 2 VIEW COMPARISON:  02/26/2018 FINDINGS: The cardiomediastinal contours are unchanged. The lungs are clear. Pulmonary vasculature is normal. No consolidation, pleural effusion, or pneumothorax. No acute osseous abnormalities are seen. IMPRESSION: No acute chest findings. Electronically Signed   By: Keith Rake M.D.   On: 08/17/2019 22:33    Pending Labs Unresulted Labs (From admission, onward)    Start     Ordered   08/25/19 0500  Creatinine, serum  (enoxaparin (LOVENOX)    CrCl >/= 30 ml/min)  Weekly,   R    Comments: while on enoxaparin therapy    08/18/19 0305   08/18/19 0500  Comprehensive metabolic panel  Tomorrow morning,   R     08/18/19 0305   08/18/19 0500  CBC  Tomorrow morning,   R     08/18/19 0305   08/18/19 0259  SARS Coronavirus 2 [facility] order, Performed in [facility] hospital lab) Nasopharyngeal Nasopharyngeal Swab  (Symptomatic/High Risk of Exposure/Tier 1 Patients Labs with Precautions)  Once,   STAT    Question Answer Comment  Is this test for diagnosis or screening Diagnosis of ill patient   Symptomatic for COVID-19 as defined by CDC Yes   Date of Symptom Onset 08/18/2019   Hospitalized for COVID-19 Yes   Admitted to ICU for COVID-19 No   Previously tested for COVID-19 No   Resident in a congregate (group) care setting No   Employed in healthcare setting No      08/18/19 0259          Vitals/Pain Today's Vitals    08/18/19 0130 08/18/19 0145 08/18/19 0146 08/18/19 0200  BP: (!) 158/77 (!) 154/90  (!) 149/82  Pulse: 62 66  60  Resp: 20 16  18   Temp:      TempSrc:  SpO2: 97% 96%  96%  PainSc:   3      Isolation Precautions Airborne and Contact precautions  Medications Medications  sodium chloride flush (NS) 0.9 % injection 3 mL (has no administration in time range)  nitroGLYCERIN (NITROSTAT) SL tablet 0.4 mg (has no administration in time range)  enoxaparin (LOVENOX) injection 40 mg (has no administration in time range)  acetaminophen (TYLENOL) tablet 650 mg (has no administration in time range)    Or  acetaminophen (TYLENOL) suppository 650 mg (has no administration in time range)  0.9 % NaCl with KCl 20 mEq/ L  infusion (has no administration in time range)  aspirin chewable tablet 324 mg (324 mg Oral Given 08/18/19 0144)  alum & mag hydroxide-simeth (MAALOX/MYLANTA) 200-200-20 MG/5ML suspension 30 mL (30 mLs Oral Given 08/18/19 0210)    And  lidocaine (XYLOCAINE) 2 % viscous mouth solution 15 mL (15 mLs Oral Given 08/18/19 0211)  potassium chloride SA (K-DUR) CR tablet 40 mEq (40 mEq Oral Given 08/18/19 0141)  iohexol (OMNIPAQUE) 350 MG/ML injection 100 mL (100 mLs Intravenous Contrast Given 08/18/19 0242)    Mobility walks Low fall risk   Focused Assessments    R Recommendations: See Admitting Provider Note  Report given to:   Additional Notes:

## 2019-08-18 NOTE — Plan of Care (Signed)

## 2019-08-18 NOTE — Progress Notes (Signed)
Explained and discussed discharge instructions, prescriptions sent electronically to pharmacy. Follow up appts. Given. No complaints at this time.

## 2019-08-18 NOTE — ED Notes (Signed)
Pt's wife died yesterday.

## 2019-08-18 NOTE — ED Provider Notes (Signed)
[facility] EMERGENCY DEPARTMENT Provider Note   CSN: [id number] Arrival date & time: 08/17/19  2144     History   Chief Complaint Chief Complaint  Patient presents with   Chest Pain    HPI Jonathan Lane is a 78 y.o. male.     Patient with left-sided chest pain, pressure and tightness has been constant since about 8:00 this evening.  Reports he walked about a mile without a problem and when he got home he had something to eat he developed chest pain in the left side of his chest that has been constant since.  He states his blood pressure at that time was 170/101 arm and 180/100 in the other arm which is unusual for him.  The pain is been constant and not getting any better or any worse.  There is no radiation to his neck, back or arm.  Denies shortness of breath, cough or fever.  He has had this pain in the past which is attributed to high blood pressure.  He had a stress test April 2019 which is negative.  Notably his wife passed away the ICU yesterday.  He states he has a history of acid reflux but denies any pain similar to this in the past.  He does have a history of hypertension, hyperlipidemia, sleep apnea.  No leg pain or leg swelling.  He describes the pain as a tightness and a pressure  The history is provided by the patient.  Chest Pain Associated symptoms: no abdominal pain, no back pain, no dizziness, no headache, no nausea, no shortness of breath, no vomiting and no weakness     Past Medical History:  Diagnosis Date   Atrial fibrillation (HCC)    Bradycardia    Bradycardia    Cardiac murmur    Dizziness    Glaucoma    Heart palpitations    Hyperlipidemia    Hypertension    Skin abnormalities    pre cancerous lesion R hand   Sleep apnea    uses cpap   Snoring     Patient Active Problem List   Diagnosis Date Noted   Obstructive sleep apnea 01/22/2019   Gastroesophageal reflux disease 05/22/2018   Sore throat 05/22/2018     RLQ abdominal pain 03/15/2018   Chest pain 01/29/2018   OSA on CPAP 10/11/2015   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 Authorizing Provider  Apoaequorin (PREVAGEN PO) Take 1 capsule by mouth as directed. [facility] and memory.    [provider]  aspirin 81 MG tablet Take 81 mg by mouth daily.     [provider]  atorvastatin (LIPITOR) 10 MG tablet Take 1 tablet (10 mg total) by mouth daily. 04/10/19   Josue Hector, MD  Coenzyme Q10 (CO Q 10 PO) Take 1 capsule by mouth daily.     [provider]  COSOPT PF 22.3-6.8 MG/ML SOLN Place 1 drop  into both eyes 2 (two) times daily.  12/28/11   [provider]  Cranberry 400 MG CAPS Take 1 capsule by mouth daily.    [provider]  diltiazem (DILACOR XR) 240 MG 24 hr capsule Take 1 capsule (240 mg total) by mouth daily. 04/10/19   Josue Hector, MD  docusate sodium (COLACE) 100 MG capsule Take 200 mg by mouth daily.    [provider]  esomeprazole (NEXIUM) 40 MG capsule TAKE 1 CAPSULE TWICE DAILY BEFORE MEALS 08/07/19   Ladene Artist, MD  fish oil-omega-3 fatty acids 1000 MG capsule Take 1 g by mouth 2 (two) times daily.     [provider]  hydrochlorothiazide (HYDRODIURIL) 25 MG tablet Take 1 tablet (25 mg total) by mouth daily. 04/10/19   Josue Hector, MD  latanoprost (XALATAN) 0.005 % ophthalmic solution Place 1 drop into both eyes at bedtime.    [provider]  losartan  (COZAAR) 100 MG tablet Take 1 tablet (100 mg total) by mouth daily. 04/10/19   Josue Hector, MD  Misc Natural Products [facility]) CAPS Take 1 capsule by mouth daily.    [provider]  multivitamin [facility]) per tablet Take 1 tablet by mouth daily.    [provider]  oxymetazoline (AFRIN) 0.05 % nasal spray Place 1 spray into both nostrils daily as needed for congestion.    [provider]  Pomegranate 250 MG CAPS Take 1 capsule by mouth daily.    [provider]  Probiotic Product (ALIGN PO) Take by mouth. Take daily    [provider]  RHOPRESSA 0.02 % SOLN as directed. 01/08/18   [provider]  UNABLE TO FIND Place into the left eye. Med Name: Gomez Cleverly Shots. 1 shot every 10 weeks.    [provider]  valACYclovir (VALTREX) 1000 MG tablet Take 1,000 mg by mouth as directed.     [provider]    Family History Family History  Problem Relation Age of Onset   Stroke Father    Stroke Mother    Stroke Sister    Breast cancer Sister    High blood pressure Sister    Diabetes Sister    Colon cancer Other        Uncle   Colon cancer Paternal Uncle    Mitral valve prolapse Other        sugery 09-27-2017   Rectal cancer Neg Hx    Stomach cancer Neg Hx     Social History Social History   Tobacco Use   Smoking status: Former Smoker    Types: Cigarettes    Quit date: 12/18/1965    Years since quitting: 53.7   Smokeless tobacco: Never Used  Substance Use Topics   Alcohol use: Yes    Comment: 5-7 drinks per week   Drug use: No     Allergies   Brimonidine tartrate-timolol   Review of Systems Review of Systems  Constitutional: Negative for activity change and appetite change.  HENT: Negative for congestion and rhinorrhea.   Eyes: Negative for visual disturbance.  Respiratory: Positive for chest tightness. Negative for shortness of breath.   Cardiovascular: Positive for chest  pain.  Gastrointestinal: Negative for abdominal pain, nausea and vomiting.  Genitourinary: Negative for dysuria and hematuria.  Musculoskeletal: Negative for back pain.  Skin: Negative for rash.  Neurological: Negative for dizziness, weakness and headaches.   all other systems are negative except as noted in the HPI  and PMH.     Physical Exam Updated Vital Signs BP (!) 169/101 (BP Location: Right Arm)    Pulse 82    Temp 98.7 F (37.1 C) (Oral)    Resp 16    SpO2 97%   Physical Exam Vitals signs and nursing note reviewed.  Constitutional:      General: He is not in acute distress.    Appearance: He is well-developed.  HENT:     Head: Normocephalic and atraumatic.     Mouth/Throat:     Pharynx: No oropharyngeal exudate.  Eyes:     Conjunctiva/sclera: Conjunctivae normal.     Pupils: Pupils are equal, round, and reactive to light.  Neck:     Musculoskeletal: Normal range of motion and neck supple.     Comments: No meningismus. Cardiovascular:     Rate and Rhythm: Normal rate and regular rhythm.     Heart sounds: Normal heart sounds. No murmur.     Comments: Equal radial pulses and grip strengths Pulmonary:     Effort: Pulmonary effort is normal. No respiratory distress.     Breath sounds: Normal breath sounds.  Chest:     Chest wall: No tenderness.  Abdominal:     Palpations: Abdomen is soft.     Tenderness: There is no abdominal tenderness. There is no guarding or rebound.  Musculoskeletal: Normal range of motion.        General: No tenderness.  Skin:    General: Skin is warm.     Capillary Refill: Capillary refill takes less than 2 seconds.  Neurological:     General: No focal deficit present.     Mental Status: He is alert and oriented to person, place, and time. Mental status is at baseline.     Cranial Nerves: No cranial nerve deficit.     Motor: No abnormal muscle tone.     Coordination: Coordination normal.     Comments: No ataxia on finger to nose  bilaterally. No pronator drift. 5/5 strength throughout. CN 2-12 intact.Equal grip strength. Sensation intact.   Psychiatric:        Behavior: Behavior normal.      ED Treatments / Results  Labs (all labs ordered are listed, but only abnormal results are displayed) Labs Reviewed  BASIC METABOLIC PANEL - Abnormal; Notable for the following components:      Result Value   Potassium 3.1 (*)    Glucose, Bld 125 (*)    All other components within normal limits  COMPREHENSIVE METABOLIC PANEL - Abnormal; Notable for the following components:   Potassium 3.2 (*)    Glucose, Bld 113 (*)    Albumin 3.4 (*)    All other components within normal limits  LIPID PANEL - Abnormal; Notable for the following components:   HDL 37 (*)    All other components within normal limits  SARS CORONAVIRUS 2 (HOSPITAL ORDER, [place] [facility])  CBC  CBC  TROPONIN I (HIGH SENSITIVITY)  TROPONIN I (HIGH SENSITIVITY)  TROPONIN I (HIGH SENSITIVITY)    EKG EKG Interpretation  Date/Time:  Sunday August 17 2019 21:55:20 EDT Ventricular Rate:  79 PR Interval:  188 QRS Duration: 82 QT Interval:  360 QTC Calculation: 412 R Axis:   79 Text Interpretation:  Normal sinus rhythm Anterior infarct , age undetermined Abnormal ECG No previous ECGs available Confirmed by Ezequiel Essex [phone]) on 08/18/2019 12:44:20 AM   Radiology Dg Chest 2 View  Result Date: 08/17/2019 CLINICAL DATA:  Chest pain EXAM: CHEST - 2 VIEW COMPARISON:  02/26/2018 FINDINGS: The cardiomediastinal contours are unchanged. The lungs are clear. Pulmonary vasculature is normal. No consolidation, pleural effusion, or pneumothorax. No acute osseous abnormalities are seen. IMPRESSION: No acute chest findings. Electronically Signed   By: Keith Rake M.D.   On: 08/17/2019 22:33   Ct Angio Chest/abd/pel For Dissection W And/or Wo Contrast  Result Date: 08/18/2019 CLINICAL DATA:  Initial evaluation for acute chest pain.  EXAM: CT ANGIOGRAPHY CHEST, ABDOMEN AND PELVIS TECHNIQUE: Multidetector CT imaging through the chest, abdomen and pelvis was performed using the standard protocol during bolus administration of intravenous contrast. Multiplanar reconstructed images and MIPs were obtained and reviewed to evaluate the vascular anatomy. CONTRAST:  175mL OMNIPAQUE IOHEXOL 350 MG/ML SOLN COMPARISON:  Prior radiograph from earlier same day. FINDINGS: CTA CHEST FINDINGS Cardiovascular: Precontrast imaging through the intrathoracic aorta demonstrates no mural thrombus or other acute abnormality. Mild to moderate scattered atheromatous plaque within the aortic arch. Postcontrast imaging demonstrates no evidence for dissection or other acute aortic pathology. Partially visualized great vessels intact. Heart size normal. Scattered 3 vessel coronary artery calcifications, most notable in the LAD. No pericardial effusion. Limited assessment of the pulmonary arterial tree grossly unremarkable. Mediastinum/Nodes: Thyroid within normal limits. No pathologically enlarged mediastinal, hilar, or axillary lymph nodes. Esophagus within normal limits. Lungs/Pleura: Tracheobronchial tree intact and patent. Lungs well inflated. No focal infiltrates. No pulmonary edema or pleural effusion. No pneumothorax. No worrisome pulmonary nodule or mass. Musculoskeletal: External soft tissues demonstrate no acute finding. No acute osseous abnormality. No discrete lytic or blastic osseous lesions. Review of the MIP images confirms the above findings. CTA ABDOMEN AND PELVIS FINDINGS VASCULAR Aorta: Normal intravascular enhancement seen throughout the intra-abdominal aorta without evidence for dissection or other acute abnormality. Moderate aorto bi-iliac atherosclerotic disease. No aneurysm. Celiac: Celiac axis and its branch vessels are widely patent and well opacified. SMA: SMA widely patent without acute abnormality. Renals: Single right renal artery, with 2  left-sided renal arteries, all of which are widely patent without abnormality. IMA: IMA widely patent to its distal aspect. Inflow: Moderate atherosclerotic change present throughout the common and proximal external and internal iliac arteries bilaterally. 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 Superimposed penetrating atheromatous ulcer at the proximal right common iliac (series 7, image 244). Iliac arteries otherwise widely patent without acute abnormality. Veins: No appreciable venous abnormality allowing for timing of the contrast bolus. Review of the MIP images confirms the above findings. NON-VASCULAR Hepatobiliary: Liver demonstrates a normal contrast enhanced appearance. Cholelithiasis without evidence for acute cholecystitis. No biliary dilatation. Pancreas: Pancreas within normal limits. Spleen: Spleen within normal limits. Adrenals/Urinary Tract: Adrenal glands are normal. Kidneys equal in size with symmetric enhancement. 9 mm nonobstructive stone present within the lower pole the left kidney. No other radiopaque calculi. No hydronephrosis or focal enhancing renal mass. No hydroureter. Partially distended bladder within normal limits. Stomach/Bowel: Stomach within normal limits. No evidence for bowel obstruction. Appendix not visualize, consistent with history of prior appendectomy. Sigmoid diverticulosis without evidence for acute diverticulitis. No acute inflammatory changes seen about the bowels. Lymphatic: No pathologically enlarged intra-abdominopelvic lymph nodes. Reproductive: Prostate mildly enlarged measuring 5.3 cm in transverse diameter. Other: No free air or fluid. Musculoskeletal: No acute osseous abnormality. No discrete lytic or blastic osseous lesions. Multilevel facet arthropathy noted within the lower lumbar spine. Review of the MIP images confirms the above findings. IMPRESSION: 1. No CT evidence for acute aortic dissection  or other acute aortic  pathology. 2. 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 3. Moderate aorto bi-iliac atherosclerotic disease, with scattered 3 vessel coronary artery calcifications. 4. Cholelithiasis. 5. Sigmoid diverticulosis without evidence for acute diverticulitis. 6. Nonobstructive left renal nephrolithiasis. Electronically Signed   By: Jeannine Boga M.D.   On: 08/18/2019 03:29    Procedures Procedures (including critical care time)  Medications Ordered in ED Medications  sodium chloride flush (NS) 0.9 % injection 3 mL (has no administration in time range)  aspirin chewable tablet 324 mg (has no administration in time range)  alum & mag hydroxide-simeth (MAALOX/MYLANTA) 200-200-20 MG/5ML suspension 30 mL (has no administration in time range)    And  lidocaine (XYLOCAINE) 2 % viscous mouth solution 15 mL (has no administration in time range)  nitroGLYCERIN (NITROSTAT) SL tablet 0.4 mg (has no administration in time range)     Initial Impression / Assessment and Plan / ED Course  I have reviewed the triage vital signs and the nursing notes.  Pertinent labs & imaging results that were available during my care of the patient were reviewed by me and considered in my medical decision making (see chart for details).        Constant left-sided chest pain since approximately 8 PM.  EKG shows septal Q waves which are unchanged from previous. Negative stress test in April 2019.  Chest pain is not reproducible.  First troponin is negative.  Patient given aspirin and nitroglycerin  Chest pain has improved with above measures.  Troponin is negative.  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  This was discussed with Dr. Scot Dock vascular surgery who saw patient.  He recommends repeat CT scan in 6 months.  Patient with no symptoms of this ulcer.  Given his heart  score of 5 and ongoing chest pain will plan observation admission.  Discussed with Dr. Maudie Mercury.  Final Clinical Impressions(s) / ED Diagnoses   Final diagnoses:  Left-sided chest pain    ED Discharge Orders    None       Keairra Bardon, Annie Main, MD 08/18/19 [phone]

## 2019-08-18 NOTE — Plan of Care (Signed)
  Problem: Education: Goal: Knowledge of General Education information will improve Description: Including pain rating scale, medication(s)/side effects and non-pharmacologic comfort measures 08/18/2019 1603 by Don Perking, RN Outcome: Completed/Met 08/18/2019 1603 by Don Perking, RN Outcome: Completed/Met 08/18/2019 1603 by Don Perking, RN Outcome: Completed/Met 08/18/2019 1122 by Don Perking, RN Outcome: Progressing   Problem: Health Behavior/Discharge Planning: Goal: Ability to manage health-related needs will improve 08/18/2019 1603 by Don Perking, RN Outcome: Completed/Met 08/18/2019 1603 by Don Perking, RN Outcome: Completed/Met 08/18/2019 1122 by Don Perking, RN Outcome: Progressing   Problem: Clinical Measurements: Goal: Ability to maintain clinical measurements within normal limits will improve 08/18/2019 1603 by Don Perking, RN Outcome: Completed/Met 08/18/2019 1603 by Don Perking, RN Outcome: Completed/Met 08/18/2019 1122 by Don Perking, RN Outcome: Progressing Goal: Will remain free from infection 08/18/2019 1603 by Don Perking, RN Outcome: Completed/Met 08/18/2019 1603 by Don Perking, RN Outcome: Completed/Met Goal: Diagnostic test results will improve 08/18/2019 1603 by Don Perking, RN Outcome: Completed/Met 08/18/2019 1603 by Don Perking, RN Outcome: Completed/Met Goal: Respiratory complications will improve 08/18/2019 1603 by Don Perking, RN Outcome: Completed/Met 08/18/2019 1603 by Don Perking, RN Outcome: Completed/Met Goal: Cardiovascular complication will be avoided 08/18/2019 1603 by Don Perking, RN Outcome: Completed/Met 08/18/2019 1603 by Don Perking, RN Outcome: Completed/Met   Problem: Activity: Goal: Risk for activity intolerance will decrease 08/18/2019 1603 by Don Perking, RN Outcome: Completed/Met 08/18/2019 1603 by  Don Perking, RN Outcome: Completed/Met 08/18/2019 1122 by Don Perking, RN Outcome: Progressing   Problem: Nutrition: Goal: Adequate nutrition will be maintained 08/18/2019 1603 by Don Perking, RN Outcome: Completed/Met 08/18/2019 1603 by Don Perking, RN Outcome: Completed/Met   Problem: Coping: Goal: Level of anxiety will decrease 08/18/2019 1603 by Don Perking, RN Outcome: Completed/Met 08/18/2019 1603 by Don Perking, RN Outcome: Completed/Met   Problem: Elimination: Goal: Will not experience complications related to bowel motility 08/18/2019 1603 by Don Perking, RN Outcome: Completed/Met 08/18/2019 1603 by Don Perking, RN Outcome: Completed/Met Goal: Will not experience complications related to urinary retention 08/18/2019 1603 by Don Perking, RN Outcome: Completed/Met 08/18/2019 1603 by Don Perking, RN Outcome: Completed/Met   Problem: Pain Managment: Goal: General experience of comfort will improve 08/18/2019 1603 by Don Perking, RN Outcome: Completed/Met 08/18/2019 1603 by Don Perking, RN Outcome: Completed/Met   Problem: Safety: Goal: Ability to remain free from injury will improve 08/18/2019 1603 by Don Perking, RN Outcome: Completed/Met 08/18/2019 1603 by Don Perking, RN Outcome: Completed/Met   Problem: Skin Integrity: Goal: Risk for impaired skin integrity will decrease 08/18/2019 1603 by Don Perking, RN Outcome: Completed/Met 08/18/2019 1603 by Don Perking, RN Outcome: Completed/Met

## 2019-08-18 NOTE — Consult Note (Signed)
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  The consult is requested by the emergency department.  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  This is asymptomatic.  He has excellent pulses distally.  I would simply recommend a follow-up CT angiogram of the abdomen and pelvis in 6 months which I will arrange.  I will see him back at that time.  He knows to call sooner if he has problems.  Fortunately he is not a smoker.  I encouraged him to stay as active as possible.  We discussed the importance of nutrition with respect to atherosclerosis.  Okay to discharge from my standpoint.  Deitra Mayo, MD, FACS Beeper [phone] Office: [phone]   HPI:   Jonathan Lane is a pleasant 78 y.o. male, who presented to the emergency room tonight with chest pain.  He describes some left-sided chest pain which came on after he had been for a mile long walk.  He states that the pain was sharp and describes some tightness associated with this.  This did not radiate to his neck or shoulder.  He did not have any significant shortness of breath associated with this. His pain at this point has completely resolved.  The patient denies any history of claudication, rest pain, or nonhealing ulcers.  His risk factors for peripheral vascular disease include hypertension, hypercholesterolemia, and a family history of premature cardiovascular disease.  He denies any history of diabetes or history of tobacco use.  Past Medical History:  Diagnosis Date  . Atrial fibrillation ([place])   . Bradycardia   . Bradycardia   . Cardiac murmur   . Dizziness   . Glaucoma   . Heart palpitations   . Hyperlipidemia   . Hypertension   . Skin abnormalities    pre cancerous lesion R hand  . Sleep apnea    uses cpap  . Snoring     Family History  Problem Relation  Age of Onset  . Stroke Father   . Stroke Mother   . Stroke Sister   . Breast cancer Sister   . High blood pressure Sister   . Diabetes Sister   . Colon cancer Other        Uncle  . Colon cancer Paternal Uncle   . Mitral valve prolapse Other        sugery 09-27-2017  . Rectal cancer Neg Hx   . Stomach cancer Neg Hx     SOCIAL HISTORY: He is not a smoker. Social History   Socioeconomic History  . Marital status: Married    Spouse name: [name]  . Number of children: 1  . Years of education: Masters  . Highest education level: Not on file  Occupational History    Employer: RETIRED  Social Needs  . Financial resource strain: Not on file  . Food insecurity    Worry: Not on file    Inability: Not on file  . Transportation needs    Medical: Not on file    Non-medical: Not on file  Tobacco Use  . Smoking status: Former Smoker    Types: Cigarettes    Quit date: 12/18/1965    Years since quitting: 53.7  . Smokeless tobacco: Never Used  Substance and Sexual Activity  . Alcohol use: Yes    Comment: 5-7 drinks per week  . Drug  use: No  . Sexual activity: Not on file  Lifestyle  . Physical activity    Days per week: Not on file    Minutes per session: Not on file  . Stress: Not on file  Relationships  . Social Herbalist on phone: Not on file    Gets together: Not on file    Attends religious service: Not on file    Active member of club or organization: Not on file    Attends meetings of clubs or organizations: Not on file    Relationship status: Not on file  . Intimate partner violence    Fear of current or ex partner: Not on file    Emotionally abused: Not on file    Physically abused: Not on file    Forced sexual activity: Not on file  Other Topics Concern  . Not on file  Social History Narrative   Patient is married [name]).   Patient is retired.   Patient has one adult child.   Patient does not drink any caffeine.   Patient is right-handed.    Patient has a Scientist, water quality.             Allergies  Allergen Reactions  . Brimonidine Tartrate-Timolol Itching    Itching eyes Itching eyes, rash    Current Facility-Administered Medications  Medication Dose Route Frequency Provider Last Rate Last Dose  . 0.9 % NaCl with KCl 20 mEq/ L  infusion   Intravenous Continuous Jani Gravel, MD 75 mL/hr at 08/18/19 0404    . acetaminophen (TYLENOL) tablet 650 mg  650 mg Oral Q6H PRN Jani Gravel, MD       Or  . acetaminophen (TYLENOL) suppository 650 mg  650 mg Rectal Q6H PRN Jani Gravel, MD      . enoxaparin (LOVENOX) injection 40 mg  40 mg Subcutaneous Daily Jani Gravel, MD      . nitroGLYCERIN (NITROSTAT) SL tablet 0.4 mg  0.4 mg Sublingual Q5 min PRN Rancour, Stephen, MD       Current Outpatient Medications  Medication Sig Dispense Refill  . amoxicillin (AMOXIL) 500 MG capsule Take 500 mg by mouth 2 (two) times daily.    Marland Kitchen Apoaequorin (PREVAGEN PO) Take 1 capsule by mouth daily. [facility] and memory.     Marland Kitchen aspirin 81 MG tablet Take 81 mg by mouth every evening.     Marland Kitchen atorvastatin (LIPITOR) 10 MG tablet Take 1 tablet (10 mg total) by mouth daily. 90 tablet 3  . Coenzyme Q10 (CO Q 10 PO) Take 1 capsule by mouth daily.     . COSOPT PF 22.3-6.8 MG/ML SOLN Place 1 drop into both eyes 2 (two) times daily.     . Cranberry 400 MG CAPS Take 1 capsule by mouth daily.    Marland Kitchen diltiazem (DILACOR XR) 240 MG 24 hr capsule Take 1 capsule (240 mg total) by mouth daily. 90 capsule 3  . docusate sodium (COLACE) 100 MG capsule Take 200 mg by mouth daily.    Marland Kitchen esomeprazole (NEXIUM) 40 MG capsule TAKE 1 CAPSULE TWICE DAILY BEFORE MEALS (Patient taking differently: Take 40 mg by mouth 2 (two) times daily. ) 60 capsule 0  . fish oil-omega-3 fatty acids 1000 MG capsule Take 1 g by mouth 2 (two) times daily.     . Glucosamine-Chondroitin (MOVE FREE PO) Take 1 tablet by mouth daily.    Marland Kitchen latanoprost (XALATAN) 0.005 % ophthalmic solution Place 1 drop into  both eyes  at bedtime.    Marland Kitchen losartan-hydrochlorothiazide (HYZAAR) 100-25 MG tablet Take 1 tablet by mouth daily.    . Misc Natural Products (PROSTATE HEALTH) CAPS Take 1 capsule by mouth daily.    . multivitamin (THERAGRAN) per tablet Take 1 tablet by mouth daily.    Marland Kitchen oxymetazoline (AFRIN) 0.05 % nasal spray Place 1 spray into both nostrils daily as needed for congestion.    . Probiotic Product (ALIGN PO) Take by mouth. Take daily    . RHOPRESSA 0.02 % SOLN Place 1 drop into both eyes at bedtime.     . valACYclovir (VALTREX) 1000 MG tablet Take 1,000 mg by mouth daily as needed (outbreak).       REVIEW OF SYSTEMS:  [X]  denotes positive finding, [ ]  denotes negative finding Cardiac  Comments:  Chest pain or chest pressure: x   Shortness of breath upon exertion:    Short of breath when lying flat:    Irregular heart rhythm: x       Vascular    Pain in calf, thigh, or hip brought on by ambulation:    Pain in feet at night that wakes you up from your sleep:     Blood clot in your veins:    Leg swelling:         Pulmonary    Oxygen at home:    Productive cough:     Wheezing:         Neurologic    Sudden weakness in arms or legs:     Sudden numbness in arms or legs:     Sudden onset of difficulty speaking or slurred speech:    Temporary loss of vision in one eye:     Problems with dizziness:         Gastrointestinal    Blood in stool:     Vomited blood:         Genitourinary    Burning when urinating:     Blood in urine:        Psychiatric    Major depression:         Hematologic    Bleeding problems:    Problems with blood clotting too easily:        Skin    Rashes or ulcers:        Constitutional    Fever or chills:     PHYSICAL EXAM:   Vitals:   08/18/19 0400 08/18/19 0445 08/18/19 0500 08/18/19 0511  BP: (!) 146/74  129/65 129/65  Pulse:  60 (!) 55 (!) 57  Resp:  18  20  Temp:      TempSrc:      SpO2:  95% 92% 96%    GENERAL: The patient is a well-nourished  male, in no acute distress. The vital signs are documented above. CARDIAC: There is a regular rate and rhythm.  VASCULAR: I do not detect carotid bruits. He has palpable femoral, popliteal, dorsalis pedis, and posterior tibial pulses bilaterally. PULMONARY: There is good air exchange bilaterally without wheezing or rales. ABDOMEN: Soft and non-tender with normal pitched bowel sounds.  I do not palpate an aneurysm. MUSCULOSKELETAL: There are no major deformities or cyanosis. NEUROLOGIC: No focal weakness or paresthesias are detected. SKIN: There are no ulcers or rashes noted. PSYCHIATRIC: The patient has a normal affect.  DATA:    CT ANGIOGRAM CHEST ABDOMEN PELVIS: I have reviewed the images of his CT angiogram of the chest abdomen and pelvis.  Of note there is no evidence of acute aortic dissection.  There is no abdominal aortic aneurysm.  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  There is slight ectasia of the right common iliac artery which measures up to 1.8 cm with a small penetrating ulcer.  LABS: Creatinine is 0.96.  GFR greater than 60.  LDL cholesterol 75.  Hemoglobin 14.3.

## 2019-08-18 NOTE — Consult Note (Addendum)
Cardiology Consultation:   Patient ID: Jonathan Lane; [id number]; 23-Sep-1941   Admit date: 08/17/2019 Date of Consult: 08/18/2019  Primary Care Provider: Darcus Austin, MD (Inactive) Primary Cardiologist: Dr. [name], MD   Patient Profile:   Jonathan Lane is a 78 y.o. male with a hx of atypical chest pain, HTN, bradycardia, OSA with CPAP use and palpitations with no documented AF who is being seen today for the evaluation of chest pain at the request of Dr. Maudie Mercury.   History of Present Illness:   Jonathan Lane is a 78yo M with a hx as stated above who presented to [facility] on 08/18/2019 with chest pain described as sharp, localized pain that began after coming home from a walk in his neighborhood yesterday. He reports that he went on a 1.5 mile walk earlier in the day on Sunday morning without problems.  He went on about his day and states that he went on another 1.5 mile walk around 7 PM in the evening.  After returning home he was making something to eat when he felt a localized chest pain with no associated SOB, diaphoresis or N/V. There was no radiation to his upper extremities, jaw of back.   He states he took his blood pressure on his left arm and reports it was 170s/100s.  He then took it on his right arm and states it was even higher in the 180's/100's.  Given this, he reported to the ED for further evaluation.  He states he has been on her tremendous amount of stress lately.  His wife passed away 3 days ago.  He has lots of support systems around but thinks that this is because of his chest discomfort.  He has had similar symptoms in the past for which he underwent a Myoview stress test that was found to be low risk, normal.  Given his symptoms, he reported to the ED for further evaluation.    On arrival in the ED, he continued to have discomfort however it slowly subsided after approximately 2 hours. BP was elevated on arriaval at 170/100. EKG with NSR with HR 65, q waves present in  lead V1, consistent with prior tracings from one year ago. HsT found to be normal at 10>10>11, not consistent with ACS. CXR with no acute cardiopulmonary process. Chest/abdomen/pelvis CTA with no dissection or other acute aortic changes. 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 Given this, VVS was consulted who recommended follow up CT angiogram of the abdomen and pelvis in 6 months and close follow up.    Jonathan Lane was last seen by his primary cardiologist on 04/10/2019 via virtual visit. At that time, he was doing well form a cardiac perspective. He was noted to have a hx of atypical chest pain, although he was not having symptoms at that time. He underwent a Myoview stress test 03/2018 that was found to be normal and no further workup was pursued.    Past Medical History:  Diagnosis Date   Atrial fibrillation (HCC)    Bradycardia    Bradycardia    Cardiac murmur    Dizziness    Glaucoma    Heart palpitations    Hyperlipidemia    Hypertension    Skin abnormalities    pre cancerous lesion R hand   Sleep apnea    uses cpap   Snoring     Past  Surgical History:  Procedure Laterality Date   APPENDECTOMY  1965   COLONOSCOPY     POLYPECTOMY     TONSILLECTOMY     as a child     Prior to Admission medications   Medication Sig Start Date End Date Taking? Authorizing Provider  amoxicillin (AMOXIL) 500 MG capsule Take 500 mg by mouth 2 (two) times daily.   Yes [provider]  Apoaequorin (PREVAGEN PO) Take 1 capsule by mouth daily. [facility] and memory.    Yes [provider]  aspirin 81 MG tablet Take 81 mg by mouth every evening.    Yes [provider]  atorvastatin (LIPITOR) 10 MG tablet Take 1 tablet (10 mg total) by mouth daily. 04/10/19  Yes Josue Hector, MD  Coenzyme Q10 (CO Q 10 PO) Take 1 capsule by  mouth daily.    Yes [provider]  COSOPT PF 22.3-6.8 MG/ML SOLN Place 1 drop into both eyes 2 (two) times daily.  12/28/11  Yes [provider]  Cranberry 400 MG CAPS Take 1 capsule by mouth daily.   Yes [provider]  diltiazem (DILACOR XR) 240 MG 24 hr capsule Take 1 capsule (240 mg total) by mouth daily. 04/10/19  Yes Josue Hector, MD  docusate sodium (COLACE) 100 MG capsule Take 200 mg by mouth daily.   Yes [provider]  esomeprazole (NEXIUM) 40 MG capsule TAKE 1 CAPSULE TWICE DAILY BEFORE MEALS Patient taking differently: Take 40 mg by mouth 2 (two) times daily.  08/07/19  Yes Ladene Artist, MD  fish oil-omega-3 fatty acids 1000 MG capsule Take 1 g by mouth 2 (two) times daily.    Yes [provider]  Glucosamine-Chondroitin (MOVE FREE PO) Take 1 tablet by mouth daily.   Yes [provider]  latanoprost (XALATAN) 0.005 % ophthalmic solution Place 1 drop into both eyes at bedtime.   Yes [provider]  losartan-hydrochlorothiazide (HYZAAR) 100-25 MG tablet Take 1 tablet by mouth daily. 04/10/19  Yes [provider]  Misc Natural Products ([place]) CAPS Take 1 capsule by mouth daily.   Yes [provider]  multivitamin [facility]) per tablet Take 1 tablet by mouth daily.   Yes [provider]  oxymetazoline (AFRIN) 0.05 % nasal spray Place 1 spray into both nostrils daily as needed for congestion.   Yes [provider]  Probiotic Product (ALIGN PO) Take by mouth. Take daily   Yes [provider]  RHOPRESSA 0.02 % SOLN Place 1 drop into both eyes at bedtime.  01/08/18  Yes [provider]  valACYclovir (VALTREX) 1000 MG tablet Take 1,000 mg by mouth daily as needed (outbreak).    Yes [provider]    Inpatient Medications: Scheduled Meds:  aspirin EC  81 mg Oral Daily   atorvastatin  10 mg Oral Daily   diltiazem  240 mg Oral Daily   docusate  sodium  200 mg Oral Daily   dorzolamide-timolol  1 drop Both Eyes BID   hydrochlorothiazide  25 mg Oral Daily   latanoprost  1 drop Both Eyes QHS   losartan  100 mg Oral Daily   Netarsudil Dimesylate  1 drop Both Eyes QHS   pantoprazole  40 mg Oral Daily   Continuous Infusions:  PRN Meds: acetaminophen **OR** acetaminophen, nitroGLYCERIN  Allergies:    Allergies  Allergen Reactions   Brimonidine Tartrate-Timolol Itching    Itching eyes Itching eyes, rash    Social History:  Social History   Socioeconomic History   Marital status: Married    Spouse name: Jonathan Lane   Number of children: 1   Years of education: Masters   Highest education level: Not on file  Occupational History    Employer: RETIRED  Social Designer, fashion/clothing strain: Not on file   Food insecurity    Worry: Not on file    Inability: Not on file   Transportation needs    Medical: Not on file    Non-medical: Not on file  Tobacco Use   Smoking status: Former Smoker    Types: Cigarettes    Quit date: 12/18/1965    Years since quitting: 53.7   Smokeless tobacco: Never Used  Substance and Sexual Activity   Alcohol use: Yes    Comment: 5-7 drinks per week   Drug use: No   Sexual activity: Not on file  Lifestyle   Physical activity    Days per week: Not on file    Minutes per session: Not on file   Stress: Not on file  Relationships   Social connections    Talks on phone: Not on file    Gets together: Not on file    Attends religious service: Not on file    Active member of club or organization: Not on file    Attends meetings of clubs or organizations: Not on file    Relationship status: Not on file   Intimate partner violence    Fear of current or ex partner: Not on file    Emotionally abused: Not on file    Physically abused: Not on file    Forced sexual activity: Not on file  Other Topics Concern   Not on file  Social History Narrative   Patient is married Jonathan Lane).   Patient is retired.   Patient has one adult child.   Patient does not drink any caffeine.   Patient is right-handed.   Patient has a Scientist, water quality.             Family History:   Family History  Problem Relation Age of Onset   Stroke Father    Stroke Mother    Stroke Sister    Breast cancer Sister    High blood pressure Sister    Diabetes Sister    Colon cancer Other        Uncle   Colon cancer Paternal Uncle    Mitral valve prolapse Other        sugery 09-27-2017   Rectal cancer Neg Hx    Stomach cancer Neg Hx    Family Status:  Family Status  Relation Name Status   Father  Deceased at age 97       stroke   Mother  Deceased at age 68       stroke   Sister  48   Sister  Deceased at age 30   Sister  (Not Specified)   Sister  (Not Specified)   Sister  (Not Specified)   Sister  (Not Specified)   Other  (Not Specified)   Annamarie Major  (Not Specified)   Other wife Alive   Neg Hx  (Not Specified)    ROS:  Please see the history of present illness.  All other ROS reviewed and negative.     Physical Exam/Data:   Vitals:   08/18/19 0730 08/18/19 0800 08/18/19 0852 08/18/19 1051  BP: (!) 155/78 (!) 151/79 (!) 161/75 Marland Kitchen)  169/79  Pulse:   74 68  Resp: 17 19 (!) 23 17  Temp:   98.1 F (36.7 C) (!) 97.5 F (36.4 C)  TempSrc:   Oral Oral  SpO2:   96% 96%   No intake or output data in the 24 hours ending 08/18/19 1056 There were no vitals filed for this visit. There is no height or weight on file to calculate BMI.   General: Well developed, well nourished, NAD Neck: Negative for carotid bruits. No JVD Lungs:Clear to ausculation bilaterally. No wheezes, rales, or rhonchi. Breathing is unlabored. Cardiovascular: RRR with S1 S2. No murmurs Abdomen: Soft, non-tender, non-distended. No obvious abdominal masses. Extremities: No edema. No clubbing or cyanosis. DP pulses 2+ bilaterally Neuro: Alert and oriented. No focal deficits. No  facial asymmetry. MAE spontaneously. Psych: Responds to questions appropriately with normal affect.     EKG:  The EKG was personally reviewed and demonstrates: 08/18/2019 NSR with HR 65 and Q waves in lead V1, consistent with prior tracing from one year ago  Telemetry:  Telemetry was personally reviewed and demonstrates:  08/18/2019 NSR HR, 70's   Relevant CV Studies:  Echocardiogram: Pending results   Myoview stress test 03/26/2018:   Nuclear stress EF: 71%.  The study is normal.  This is a low risk study.  The left ventricular ejection fraction is hyperdynamic (>65%).  There was no ST segment deviation noted during stress. BP did drop in Stage 3 but likely erroneous as he had a hypertensive BP response in recovery.  Laboratory Data:  Chemistry Recent Labs  Lab 08/17/19 2155 08/18/19 0324  NA 139 138  K 3.1* 3.2*  CL 101 100  CO2 26 27  GLUCOSE 125* 113*  BUN 19 17  CREATININE 0.92 0.96  CALCIUM 9.4 8.9  GFRNONAA >60 >60  GFRAA >60 >60  ANIONGAP 12 11    Total Protein  Date Value Ref Range Status  08/18/2019 6.8 6.5 - 8.1 g/dL Final   Albumin  Date Value Ref Range Status  08/18/2019 3.4 (L) 3.5 - 5.0 g/dL Final   AST  Date Value Ref Range Status  08/18/2019 23 15 - 41 U/L Final   ALT  Date Value Ref Range Status  08/18/2019 19 0 - 44 U/L Final   Alkaline Phosphatase  Date Value Ref Range Status  08/18/2019 85 38 - 126 U/L Final   Total Bilirubin  Date Value Ref Range Status  08/18/2019 0.7 0.3 - 1.2 mg/dL Final   Hematology Recent Labs  Lab 08/17/19 2155 08/18/19 0550  WBC 8.3 7.9  RBC 4.64 4.62  HGB 14.5 14.3  HCT 44.0 44.6  MCV 94.8 96.5  MCH 31.3 31.0  MCHC 33.0 32.1  RDW 13.4 13.8  PLT 204 197   Cardiac EnzymesNo results for input(s): TROPONINI in the last 168 hours. No results for input(s): TROPIPOC in the last 168 hours.  BNPNo results for input(s): BNP, PROBNP in the last 168 hours.  DDimer No results for input(s): DDIMER in  the last 168 hours. TSH: No results found for: TSH Lipids: Lab Results  Component Value Date   CHOL 123 08/18/2019   HDL 37 (L) 08/18/2019   LDLCALC 75 08/18/2019   TRIG 54 08/18/2019   CHOLHDL 3.3 08/18/2019   HgbA1c:No results found for: HGBA1C  Radiology/Studies:  Dg Chest 2 View  Result Date: 08/17/2019 CLINICAL DATA:  Chest pain EXAM: CHEST - 2 VIEW COMPARISON:  02/26/2018 FINDINGS: The cardiomediastinal contours are unchanged. The lungs are  clear. Pulmonary vasculature is normal. No consolidation, pleural effusion, or pneumothorax. No acute osseous abnormalities are seen. IMPRESSION: No acute chest findings. Electronically Signed   By: Keith Rake M.D.   On: 08/17/2019 22:33   Ct Angio Chest/abd/pel For Dissection W And/or Wo Contrast  Result Date: 08/18/2019 CLINICAL DATA:  Initial evaluation for acute chest pain. EXAM: CT ANGIOGRAPHY CHEST, ABDOMEN AND PELVIS TECHNIQUE: Multidetector CT imaging through the chest, abdomen and pelvis was performed using the standard protocol during bolus administration of intravenous contrast. Multiplanar reconstructed images and MIPs were obtained and reviewed to evaluate the vascular anatomy. CONTRAST:  178mL OMNIPAQUE IOHEXOL 350 MG/ML SOLN COMPARISON:  Prior radiograph from earlier same day. FINDINGS: CTA CHEST FINDINGS Cardiovascular: Precontrast imaging through the intrathoracic aorta demonstrates no mural thrombus or other acute abnormality. Mild to moderate scattered atheromatous plaque within the aortic arch. Postcontrast imaging demonstrates no evidence for dissection or other acute aortic pathology. Partially visualized great vessels intact. Heart size normal. Scattered 3 vessel coronary artery calcifications, most notable in the LAD. No pericardial effusion. Limited assessment of the pulmonary arterial tree grossly unremarkable. Mediastinum/Nodes: Thyroid within normal limits. No pathologically enlarged mediastinal, hilar, or axillary lymph  nodes. Esophagus within normal limits. Lungs/Pleura: Tracheobronchial tree intact and patent. Lungs well inflated. No focal infiltrates. No pulmonary edema or pleural effusion. No pneumothorax. No worrisome pulmonary nodule or mass. Musculoskeletal: External soft tissues demonstrate no acute finding. No acute osseous abnormality. No discrete lytic or blastic osseous lesions. Review of the MIP images confirms the above findings. CTA ABDOMEN AND PELVIS FINDINGS VASCULAR Aorta: Normal intravascular enhancement seen throughout the intra-abdominal aorta without evidence for dissection or other acute abnormality. Moderate aorto bi-iliac atherosclerotic disease. No aneurysm. Celiac: Celiac axis and its branch vessels are widely patent and well opacified. SMA: SMA widely patent without acute abnormality. Renals: Single right renal artery, with 2 left-sided renal arteries, all of which are widely patent without abnormality. IMA: IMA widely patent to its distal aspect. Inflow: Moderate atherosclerotic change present throughout the common and proximal external and internal iliac arteries bilaterally. 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 Superimposed penetrating atheromatous ulcer at the proximal right common iliac (series 7, image 244). Iliac arteries otherwise widely patent without acute abnormality. Veins: No appreciable venous abnormality allowing for timing of the contrast bolus. Review of the MIP images confirms the above findings. NON-VASCULAR Hepatobiliary: Liver demonstrates a normal contrast enhanced appearance. Cholelithiasis without evidence for acute cholecystitis. No biliary dilatation. Pancreas: Pancreas within normal limits. Spleen: Spleen within normal limits. Adrenals/Urinary Tract: Adrenal glands are normal. Kidneys equal in size with symmetric enhancement. 9 mm nonobstructive stone present within the lower pole the left kidney. No other radiopaque calculi. No  hydronephrosis or focal enhancing renal mass. No hydroureter. Partially distended bladder within normal limits. Stomach/Bowel: Stomach within normal limits. No evidence for bowel obstruction. Appendix not visualize, consistent with history of prior appendectomy. Sigmoid diverticulosis without evidence for acute diverticulitis. No acute inflammatory changes seen about the bowels. Lymphatic: No pathologically enlarged intra-abdominopelvic lymph nodes. Reproductive: Prostate mildly enlarged measuring 5.3 cm in transverse diameter. Other: No free air or fluid. Musculoskeletal: No acute osseous abnormality. No discrete lytic or blastic osseous lesions. Multilevel facet arthropathy noted within the lower lumbar spine. Review of the MIP images confirms the above findings. IMPRESSION: 1. No CT evidence for acute aortic dissection or other acute aortic pathology. 2. 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 3. Moderate aorto bi-iliac atherosclerotic disease, with scattered 3 vessel coronary artery calcifications. 4. Cholelithiasis. 5. Sigmoid diverticulosis without evidence for acute diverticulitis. 6. Nonobstructive left renal nephrolithiasis. Electronically Signed   By: Jeannine Boga M.D.   On: 08/18/2019 03:29   Assessment and Plan:   1. Chest pain: -Described as sharp, localized pain that began after coming home from a walk in his neighborhood. He reports no associated SOB, diaphoresis or N/V. There was no radiation to his upper extremities, jaw of back. Given his symptoms, he reported to the ED for further evaluation. Pt noted BP elevated at 170/100.  -EKG with NSR with HR 65, q waves present in lead V1, consistent with prior tracings from one year ago.  -HsT 10>10>11>>>not consistent with ACS -CXR with no acute cardiopulmonary process.  -Chest/abdomen/pelvis CTA with no dissection or other acute aortic changes. 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 Given this, VVS was consulted who recommended follow up CT angiogram of the abdomen and pelvis in 6 months and close follow up.   -Echocardiogram ordered per primary team with pending results -Continue ASA, statin -Has a history of bradycardia and therefore would steer clear of beta-blocker for now  -Likely chest pain is secondary to hypertension and stress given that his wife just recently passed away 3 days ago.  He underwent a Myoview stress test last year that was low risk, negative with no ischemia.  Given negative high-sensitivity troponin and no EKG changes, would would not consider further workup for now. If recurrent symptoms, could consider adding low-dose Imdur and following closely -If further ischemic evaluation, could certainly consider outpatient setting coronary CT given coronary calcifications on chest/abdomen/pelvis CT   2. HTN: -Elevated, 169/79>161/75>151/79 -Continue diltiazem, hydrochlorothiazide, losartan -Needs greater BP control however reports home SBPs in the 130-140s when he is not under stress -Would follow OP for now and titrate/add medications if remains elevated  -No BB given bradycardia  -Creatinine stable at 0.96 today   3. Hx of palpitations: -Hx of know palpitations>>.was last seen by Dr. Johnsie Cancel -Per chart review, does not have a documented hx of atrial fibrillation -Was placed on Diltiazem for palpations with resolution -Denies palpitations -Continue current dose at 240mg  QD   4. Hypokalemia: -K+ on hospital arrival 3.1 -Replaced per primary team  -We will need low-dose K+ supplementation at discharge -Repeat BMET at follow-up  5. HLD: -LDL note to be 75mg /dl  -Goal <70 for aggressive CV risk factor reduction    For questions or updates, please contact [place] Please consult  [email or web address] for contact info under Cardiology/STEMI.   SignedKathyrn Drown NP-C [facility] Pager: [phone] 08/18/2019 10:56 AM   Agree with note by Kathyrn Drown NP  We were asked to see Jonathan Lane for evaluation of atypical chest pain.  He is a patient of Dr. Jesse Fall.  He does have a history of treated hypertension as well as obstructive sleep apnea on CPAP.  He unfortunately lost his wife of 48 years several days ago and has been under a lot of stress.  He is admitted with atypical chest pain and hypertension.  Blood pressures are usually better controlled at home on a diuretic, diltiazem and losartan.  His pain resolved after being given aspirin and a GI cocktail.  His enzymes are low and flat.  His EKG shows no acute changes.  His  exam is benign.  I believe this pain does not represent ACS.  Does have coronary calcium calcification on chest CT.  Okay for discharge home later today with early outpatient follow-up with Dr. Johnsie Cancel.  If he has recurrent chest pain he will need coronary CTA to further evaluate.   Lorretta Harp, M.D., [place], [facility], [place] [address] [place], [place]  [id number]  [phone] 08/18/2019 12:42 PM

## 2019-08-18 NOTE — Progress Notes (Signed)
Offered pastoral presence and prayer to Jonathan Lane upon referral of nurse.

## 2019-08-18 NOTE — Discharge Summary (Signed)
Physician Discharge Summary  PLEZ HUNDLEY [id number] DOB: 1941/11/05 DOA: 08/17/2019  PCP: Darcus Austin, MD (Inactive)  Admit date: 08/17/2019 Discharge date: 08/18/2019  Admitted From: Home  Disposition:  Home   Recommendations for Outpatient Follow-up:  1. Follow up with PCP in 1-2 weeks 2. Please obtain BMP/CBC in one week 3. Further adjustment of BP medication as needed.  4. Follow up with cardiology for further evaluation of chest pain.   Home Health: none  Discharge Condition: Stable.  CODE STATUS: full ocde Diet recommendation: Heart Healthy   Brief/Interim Summary: Kahli Derogatis  is a 78 y.o. male,   w hypertension, hyperlipidemia, Pafib , OSA, presents with c/o chest pain left sided, "sharp" and "tightness",. Without radiation. Pt denies fever, chills, cough, palp, sob, n/v, abd pain, diarrhea, brbpr. Pt states that the pain started after he got home from a mile walk.  Took the walk about 7pm last nite.  Pt states pain started about 30 minutes before arrival to ED.  bp at home elevated 170/100.  Pt states that pain gradually subsided after 2 hours of being in the ER.   In ED, T 98.7, P 82  R 16, Bp 169/101 pox 97% on RA. CXR:No acute chest findings. Na 139, K 3.1,  Bun 19, Creatinine 0.92 Wbc 8.3, Hgb 14.5, Plt 204 Trop 10. Ekg :  nsr at 65, nl axis, nl int, q in v1, (old), no significant change from prior ekg in 2019   1-Chest pain;  Patient presents after episode of chest pain.  Troponin x3-.  Echocardiogram pending.  Patient was evaluated by cardiology who is recommending outpatient follow-up with Dr. Johnsie Cancel.  EKG without significant changes from prior tracing. Continue with aspirin and statins.  Hypokalemia: Repleted. Hypertension; Continue with losartan hydrochlorothiazide and Cardizem.  Continue with aspirin.  Lipidemia continue Lipitor.  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 Doren Custard consulted.  He is recommending follow-up CT angiogram in 3 months.    Discharge Diagnoses:  Principal Problem:   Chest pain Active Problems:   Gastroesophageal reflux disease   Hypokalemia    Discharge Instructions  Discharge Instructions    Diet - low sodium heart healthy   Complete by: As directed    Increase activity slowly   Complete by: As directed      Allergies as of 08/18/2019      Reactions   Brimonidine Tartrate-timolol Itching   Itching eyes Itching eyes, rash      Medication List    TAKE these medications   ALIGN PO Take by mouth. Take daily   amoxicillin 500 MG capsule Commonly known as: AMOXIL Take 500 mg by mouth 2 (two) times daily.   aspirin 81 MG tablet Take 81 mg by mouth every evening.   atorvastatin 10 MG tablet Commonly known as: LIPITOR Take 1 tablet (10 mg total) by mouth daily.   CO Q 10 PO Take 1 capsule by mouth daily.   Cosopt PF 22.3-6.8 MG/ML Soln ophthalmic solution Generic drug: dorzolamidel-timolol Place 1 drop into both eyes 2 (two) times daily.   Cranberry 400 MG Caps Take 1 capsule by mouth daily.   diltiazem 240 MG 24 hr capsule Commonly known as: DILACOR XR Take 1 capsule (240 mg total) by mouth daily.   docusate sodium 100 MG capsule Commonly known as: COLACE Take 200 mg by mouth daily.   esomeprazole 40 MG capsule Commonly known as:  NEXIUM TAKE 1 CAPSULE TWICE DAILY BEFORE MEALS What changed: See the new instructions.   fish oil-omega-3 fatty acids 1000 MG capsule Take 1 g by mouth 2 (two) times daily.   latanoprost 0.005 % ophthalmic solution Commonly known as: XALATAN Place 1 drop into both eyes at bedtime.   losartan-hydrochlorothiazide 100-25 MG tablet Commonly known as: HYZAAR Take 1 tablet by mouth daily.   MOVE FREE PO Take 1 tablet by mouth daily.   multivitamin per tablet Take 1 tablet by mouth daily.   nitroGLYCERIN 0.4 MG SL tablet Commonly known as:  NITROSTAT Place 1 tablet (0.4 mg total) under the tongue every 5 (five) minutes as needed for chest pain.   oxymetazoline 0.05 % nasal spray Commonly known as: AFRIN Place 1 spray into both nostrils daily as needed for congestion.   PREVAGEN PO Take 1 capsule by mouth daily. [facility] and memory.   Prostate Health Caps Take 1 capsule by mouth daily.   Rhopressa 0.02 % Soln Generic drug: Netarsudil Dimesylate Place 1 drop into both eyes at bedtime.   valACYclovir 1000 MG tablet Commonly known as: VALTREX Take 1,000 mg by mouth daily as needed (outbreak).       Allergies  Allergen Reactions  . Brimonidine Tartrate-Timolol Itching    Itching eyes Itching eyes, rash    Consultations:  Cardiology   Procedures/Studies: Dg Chest 2 View  Result Date: 08/17/2019 CLINICAL DATA:  Chest pain EXAM: CHEST - 2 VIEW COMPARISON:  02/26/2018 FINDINGS: The cardiomediastinal contours are unchanged. The lungs are clear. Pulmonary vasculature is normal. No consolidation, pleural effusion, or pneumothorax. No acute osseous abnormalities are seen. IMPRESSION: No acute chest findings. Electronically Signed   By: Keith Rake M.D.   On: 08/17/2019 22:33   Ct Angio Chest/abd/pel For Dissection W And/or Wo Contrast  Result Date: 08/18/2019 CLINICAL DATA:  Initial evaluation for acute chest pain. EXAM: CT ANGIOGRAPHY CHEST, ABDOMEN AND PELVIS TECHNIQUE: Multidetector CT imaging through the chest, abdomen and pelvis was performed using the standard protocol during bolus administration of intravenous contrast. Multiplanar reconstructed images and MIPs were obtained and reviewed to evaluate the vascular anatomy. CONTRAST:  159mL OMNIPAQUE IOHEXOL 350 MG/ML SOLN COMPARISON:  Prior radiograph from earlier same day. FINDINGS: CTA CHEST FINDINGS Cardiovascular: Precontrast imaging through the intrathoracic aorta demonstrates no mural thrombus or other acute abnormality. Mild to moderate scattered  atheromatous plaque within the aortic arch. Postcontrast imaging demonstrates no evidence for dissection or other acute aortic pathology. Partially visualized great vessels intact. Heart size normal. Scattered 3 vessel coronary artery calcifications, most notable in the LAD. No pericardial effusion. Limited assessment of the pulmonary arterial tree grossly unremarkable. Mediastinum/Nodes: Thyroid within normal limits. No pathologically enlarged mediastinal, hilar, or axillary lymph nodes. Esophagus within normal limits. Lungs/Pleura: Tracheobronchial tree intact and patent. Lungs well inflated. No focal infiltrates. No pulmonary edema or pleural effusion. No pneumothorax. No worrisome pulmonary nodule or mass. Musculoskeletal: External soft tissues demonstrate no acute finding. No acute osseous abnormality. No discrete lytic or blastic osseous lesions. Review of the MIP images confirms the above findings. CTA ABDOMEN AND PELVIS FINDINGS VASCULAR Aorta: Normal intravascular enhancement seen throughout the intra-abdominal aorta without evidence for dissection or other acute abnormality. Moderate aorto bi-iliac atherosclerotic disease. No aneurysm. Celiac: Celiac axis and its branch vessels are widely patent and well opacified. SMA: SMA widely patent without acute abnormality. Renals: Single right renal artery, with 2 left-sided renal arteries, all of which are widely patent without abnormality. IMA: IMA widely  patent to its distal aspect. Inflow: Moderate atherosclerotic change present throughout the common and proximal external and internal iliac arteries bilaterally. 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 Superimposed penetrating atheromatous ulcer at the proximal right common iliac (series 7, image 244). Iliac arteries otherwise widely patent without acute abnormality. Veins: No appreciable venous abnormality allowing for timing of the contrast bolus. Review of the MIP images  confirms the above findings. NON-VASCULAR Hepatobiliary: Liver demonstrates a normal contrast enhanced appearance. Cholelithiasis without evidence for acute cholecystitis. No biliary dilatation. Pancreas: Pancreas within normal limits. Spleen: Spleen within normal limits. Adrenals/Urinary Tract: Adrenal glands are normal. Kidneys equal in size with symmetric enhancement. 9 mm nonobstructive stone present within the lower pole the left kidney. No other radiopaque calculi. No hydronephrosis or focal enhancing renal mass. No hydroureter. Partially distended bladder within normal limits. Stomach/Bowel: Stomach within normal limits. No evidence for bowel obstruction. Appendix not visualize, consistent with history of prior appendectomy. Sigmoid diverticulosis without evidence for acute diverticulitis. No acute inflammatory changes seen about the bowels. Lymphatic: No pathologically enlarged intra-abdominopelvic lymph nodes. Reproductive: Prostate mildly enlarged measuring 5.3 cm in transverse diameter. Other: No free air or fluid. Musculoskeletal: No acute osseous abnormality. No discrete lytic or blastic osseous lesions. Multilevel facet arthropathy noted within the lower lumbar spine. Review of the MIP images confirms the above findings. IMPRESSION: 1. No CT evidence for acute aortic dissection or other acute aortic pathology. 2. 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 3. Moderate aorto bi-iliac atherosclerotic disease, with scattered 3 vessel coronary artery calcifications. 4. Cholelithiasis. 5. Sigmoid diverticulosis without evidence for acute diverticulitis. 6. Nonobstructive left renal nephrolithiasis. Electronically Signed   By: Jeannine Boga M.D.   On: 08/18/2019 03:29   (Echo, Carotid, EGD, Colonoscopy, ERCP)    Subjective:   Discharge Exam: Vitals:   08/18/19 1051 08/18/19 1345  BP: (!) 169/79 (!) 146/88   Pulse: 68 60  Resp: 17 18  Temp: (!) 97.5 F (36.4 C) 97.8 F (36.6 C)  SpO2: 96% 99%     General: Pt is alert, awake, not in acute distress Cardiovascular: RRR, S1/S2 +, no rubs, no gallops Respiratory: CTA bilaterally, no wheezing, no rhonchi Abdominal: Soft, NT, ND, bowel sounds + Extremities: no edema, no cyanosis    The results of significant diagnostics from this hospitalization (including imaging, microbiology, ancillary and laboratory) are listed below for reference.     Microbiology: Recent Results (from the past 240 hour(s))  SARS Coronavirus 2 [facility] order, Performed in [facility] hospital lab) Nasopharyngeal Nasopharyngeal Swab     Status: None   Collection Time: 08/18/19  3:24 AM   Specimen: Nasopharyngeal Swab  Result Value Ref Range Status   SARS Coronavirus 2 NEGATIVE NEGATIVE Final    Comment: (NOTE) If result is NEGATIVE SARS-CoV-2 target nucleic acids are NOT DETECTED. The SARS-CoV-2 RNA is generally detectable in upper and lower  respiratory specimens during the acute phase of infection. The lowest  concentration of SARS-CoV-2 viral copies this assay can detect is 250  copies / mL. A negative result does not preclude SARS-CoV-2 infection  and should not be used as the sole basis for treatment or other  patient management decisions.  A negative result may occur with  improper specimen collection / handling, submission of specimen other  than nasopharyngeal swab, presence of viral mutation(s) within the  areas targeted by this assay, and  inadequate number of viral copies  (<250 copies / mL). A negative result must be combined with clinical  observations, patient history, and epidemiological information. If result is POSITIVE SARS-CoV-2 target nucleic acids are DETECTED. The SARS-CoV-2 RNA is generally detectable in upper and lower  respiratory specimens dur ing the acute phase of infection.  Positive  results are indicative of active infection  with SARS-CoV-2.  Clinical  correlation with patient history and other diagnostic information is  necessary to determine patient infection status.  Positive results do  not rule out bacterial infection or co-infection with other viruses. If result is PRESUMPTIVE POSTIVE SARS-CoV-2 nucleic acids MAY BE PRESENT.   A presumptive positive result was obtained on the submitted specimen  and confirmed on repeat testing.  While 2019 novel coronavirus  (SARS-CoV-2) nucleic acids may be present in the submitted sample  additional confirmatory testing may be necessary for epidemiological  and / or clinical management purposes  to differentiate between  SARS-CoV-2 and other Sarbecovirus currently known to infect humans.  If clinically indicated additional testing with an alternate test  methodology [phone]) is advised. The SARS-CoV-2 RNA is generally  detectable in upper and lower respiratory sp ecimens during the acute  phase of infection. The expected result is Negative. Fact Sheet for Patients:  StrictlyIdeas.no Fact Sheet for Healthcare Providers: BankingDealers.co.za This test is not yet approved or cleared by the [place] FDA and has been authorized for detection and/or diagnosis of SARS-CoV-2 by FDA under an Emergency Use Authorization (EUA).  This EUA will remain in effect (meaning this test can be used) for the duration of the COVID-19 declaration under Section 564(b)(1) of the Act, 21 U.S.C. section 360bbb-3(b)(1), unless the authorization is terminated or revoked sooner. Performed at [facility], [place] [address], [place], [place] [address]   MRSA PCR Screening     Status: None   Collection Time: 08/18/19  9:15 AM   Specimen: Nasal Mucosa; Nasopharyngeal  Result Value Ref Range Status   MRSA by PCR NEGATIVE NEGATIVE Final    Comment:        The GeneXpert MRSA Assay (FDA approved for NASAL specimens only), is one component  of a comprehensive MRSA colonization surveillance program. It is not intended to diagnose MRSA infection nor to guide or monitor treatment for MRSA infections. Performed at [facility], [place] [address] [place], [place] [address]      Labs: BNP (last 3 results) No results for input(s): BNP in the last 8760 hours. Basic Metabolic Panel: Recent Labs  Lab 08/17/19 2155 08/18/19 0324  NA 139 138  K 3.1* 3.2*  CL 101 100  CO2 26 27  GLUCOSE 125* 113*  BUN 19 17  CREATININE 0.92 0.96  CALCIUM 9.4 8.9   Liver Function Tests: Recent Labs  Lab 08/18/19 0324  AST 23  ALT 19  ALKPHOS 85  BILITOT 0.7  PROT 6.8  ALBUMIN 3.4*   No results for input(s): LIPASE, AMYLASE in the last 168 hours. No results for input(s): AMMONIA in the last 168 hours. CBC: Recent Labs  Lab 08/17/19 2155 08/18/19 0550  WBC 8.3 7.9  HGB 14.5 14.3  HCT 44.0 44.6  MCV 94.8 96.5  PLT 204 197   Cardiac Enzymes: No results for input(s): CKTOTAL, CKMB, CKMBINDEX, TROPONINI in the last 168 hours. BNP: Invalid input(s): POCBNP CBG: No results for input(s): GLUCAP in the last 168 hours. D-Dimer No results for input(s): DDIMER in the last 72 hours. Hgb  A1c No results for input(s): HGBA1C in the last 72 hours. Lipid Profile Recent Labs    08/18/19 0550  CHOL 123  HDL 37*  LDLCALC 75  TRIG 54  CHOLHDL 3.3   Thyroid function studies No results for input(s): TSH, T4TOTAL, T3FREE, THYROIDAB in the last 72 hours.  Invalid input(s): FREET3 Anemia work up No results for input(s): VITAMINB12, FOLATE, FERRITIN, TIBC, IRON, RETICCTPCT in the last 72 hours. Urinalysis No results found for: COLORURINE, APPEARANCEUR, LABSPEC, [place], GLUCOSEU, [place], BILIRUBINUR, KETONESUR, PROTEINUR, UROBILINOGEN, NITRITE, LEUKOCYTESUR Sepsis Labs Invalid input(s): PROCALCITONIN,  WBC,  [place] Microbiology Recent Results (from the past 240 hour(s))  SARS Coronavirus 2 [facility] order, Performed in  [facility] hospital lab) Nasopharyngeal Nasopharyngeal Swab     Status: None   Collection Time: 08/18/19  3:24 AM   Specimen: Nasopharyngeal Swab  Result Value Ref Range Status   SARS Coronavirus 2 NEGATIVE NEGATIVE Final    Comment: (NOTE) If result is NEGATIVE SARS-CoV-2 target nucleic acids are NOT DETECTED. The SARS-CoV-2 RNA is generally detectable in upper and lower  respiratory specimens during the acute phase of infection. The lowest  concentration of SARS-CoV-2 viral copies this assay can detect is 250  copies / mL. A negative result does not preclude SARS-CoV-2 infection  and should not be used as the sole basis for treatment or other  patient management decisions.  A negative result may occur with  improper specimen collection / handling, submission of specimen other  than nasopharyngeal swab, presence of viral mutation(s) within the  areas targeted by this assay, and inadequate number of viral copies  (<250 copies / mL). A negative result must be combined with clinical  observations, patient history, and epidemiological information. If result is POSITIVE SARS-CoV-2 target nucleic acids are DETECTED. The SARS-CoV-2 RNA is generally detectable in upper and lower  respiratory specimens dur ing the acute phase of infection.  Positive  results are indicative of active infection with SARS-CoV-2.  Clinical  correlation with patient history and other diagnostic information is  necessary to determine patient infection status.  Positive results do  not rule out bacterial infection or co-infection with other viruses. If result is PRESUMPTIVE POSTIVE SARS-CoV-2 nucleic acids MAY BE PRESENT.   A presumptive positive result was obtained on the submitted specimen  and confirmed on repeat testing.  While 2019 novel coronavirus  (SARS-CoV-2) nucleic acids may be present in the submitted sample  additional confirmatory testing may be necessary for epidemiological  and / or clinical  management purposes  to differentiate between  SARS-CoV-2 and other Sarbecovirus currently known to infect humans.  If clinically indicated additional testing with an alternate test  methodology [phone]) is advised. The SARS-CoV-2 RNA is generally  detectable in upper and lower respiratory sp ecimens during the acute  phase of infection. The expected result is Negative. Fact Sheet for Patients:  StrictlyIdeas.no Fact Sheet for Healthcare Providers: BankingDealers.co.za This test is not yet approved or cleared by the [place] FDA and has been authorized for detection and/or diagnosis of SARS-CoV-2 by FDA under an Emergency Use Authorization (EUA).  This EUA will remain in effect (meaning this test can be used) for the duration of the COVID-19 declaration under Section 564(b)(1) of the Act, 21 U.S.C. section 360bbb-3(b)(1), unless the authorization is terminated or revoked sooner. Performed at [facility], [place] [address] [place], [place] [address]   MRSA PCR Screening     Status: None   Collection Time: 08/18/19  9:15  AM   Specimen: Nasal Mucosa; Nasopharyngeal  Result Value Ref Range Status   MRSA by PCR NEGATIVE NEGATIVE Final    Comment:        The GeneXpert MRSA Assay (FDA approved for NASAL specimens only), is one component of a comprehensive MRSA colonization surveillance program. It is not intended to diagnose MRSA infection nor to guide or monitor treatment for MRSA infections. Performed at [facility], [place] [address], [place], [place] [address]      Time coordinating discharge: 40 minutes  SIGNED:   Elmarie Shiley, MD  [facility]

## 2019-08-21 DIAGNOSIS — I10 Essential (primary) hypertension: Secondary | ICD-10-CM | POA: Diagnosis not present

## 2019-08-21 DIAGNOSIS — I723 Aneurysm of iliac artery: Secondary | ICD-10-CM | POA: Diagnosis not present

## 2019-08-21 DIAGNOSIS — Z23 Encounter for immunization: Secondary | ICD-10-CM | POA: Diagnosis not present

## 2019-08-21 DIAGNOSIS — F4321 Adjustment disorder with depressed mood: Secondary | ICD-10-CM | POA: Diagnosis not present

## 2019-08-21 DIAGNOSIS — R634 Abnormal weight loss: Secondary | ICD-10-CM | POA: Diagnosis not present

## 2019-08-21 DIAGNOSIS — K047 Periapical abscess without sinus: Secondary | ICD-10-CM | POA: Diagnosis not present

## 2019-08-21 DIAGNOSIS — Z09 Encounter for follow-up examination after completed treatment for conditions other than malignant neoplasm: Secondary | ICD-10-CM | POA: Diagnosis not present

## 2019-08-24 NOTE — Progress Notes (Signed)
Cardiology Office Note   Date:  08/28/2019   ID:  Jonathan Lane, DOB [birth year], MRN [id number]  PCP:  Jonathan Smoker, MD  Cardiologist:  Dr. Johnsie Cancel, MD   Chief Complaint  Patient presents with  . Hospitalization Follow-up    History of Present Illness: Jonathan Lane is a 78 y.o. male with a hx of atypical chest pain, HTN, bradycardia, OSA with CPAP use and palpitations with no documented AF who was seen in hospital consultation 08/18/2019 for the evaluation of chest pain    Jonathan Lane presented to [facility] on 08/18/2019 with chest pain described as a sharp, localized pain that began after coming home from a walk in his neighborhood. He went on about his day and stated that he went on another 1.5 mile walk around 7 PM in the evening. After returning home he was making something to eat when he felt a localized chest pain with no associated SOB, diaphoresis or N/V. There was no radiation to his upper extremities, jaw of back.   His BP was elevated at home. Given this, he reported to the ED for further evaluation.  He was noted to be under a tremendous amount of stress lately as his wife passed away three days prior to presentation. He has had similar symptoms in the past for which he underwent a Myoview stress test that was found to be low risk, normal 03/2019 with no further workup pursued.   On arrival in the ED, he continued to have discomfort however it slowly subsided after approximately 2 hours. BP was elevated on arriaval at 170/100. EKG with NSR with HR 65, q waves present in lead V1, consistent with prior tracings from one year ago. HsT found to be normal at 10>10>11, not consistent with ACS. CXR with no acute cardiopulmonary process. Chest/abdomen/pelvis CTA with no dissection or other acute aortic changes. 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  Given this, [facility] was consulted who recommended follow up CT angiogram of the abdomen and pelvis in 6 months and close follow up.    He then presented 08/25/2019 with left-sided facial numbness and left upper arm numbness that began on morning of presentation who was seen by neurology and found to have TIA.  Dr. Erlinda Lane recommended ASA and Plavix for 3 weeks followed by Plavix alone.  His Lipitor was increased from 10 to 20 mg daily for secondary stroke prevention.  Also with recommendations for 30-day event monitor which we will place.  Today he has lots of concerns about his new dx of TIA.  He denies recurrent chest pain, palpitations, LE swelling, orthopnea, nausea, vomiting, diaphoresis or syncope.  He has had no recurrent numbness or tingling in his arms or face.  He has followed with a neurologist in the past, would like to follow with him versus Dr. Erlinda Lane.  Is still dealing with lots of stress given his current situation and the recent death of his wife.  Reports he continues to walk 1.5 miles per day without anginal symptoms he states this is given him some reprieve from his stress level.  Her funeral is on Saturday.  He has an upcoming dental appointment for which I recommended him calling their office to inform them of the ASA and Plavix therapies.  We will place a 30-day monitor out of concern for possible AF in the setting of history of  palpitations and recent TIA.  Patient agrees.  Past Medical History:  Diagnosis Date  . Atrial fibrillation ([place])   . Bradycardia   . Bradycardia   . Cardiac murmur   . Dizziness   . Glaucoma   . Heart palpitations   . Hyperlipidemia   . Hypertension   . Numbness 08/26/2019   LEFT FACE  . Skin abnormalities    pre cancerous lesion R hand  . Sleep apnea    uses cpap  . Snoring     Past Surgical History:  Procedure Laterality Date  . APPENDECTOMY  1965  . COLONOSCOPY    . POLYPECTOMY    . TONSILLECTOMY     as a child     Current Outpatient  Medications  Medication Sig Dispense Refill  . amoxicillin (AMOXIL) 500 MG capsule Take 500 mg by mouth 2 (two) times daily.    Marland Kitchen Apoaequorin (PREVAGEN PO) Take 1 capsule by mouth daily. [facility] and memory.     Marland Kitchen aspirin 81 MG tablet Take 1 tablet (81 mg total) by mouth every evening for 21 days.  0  . atorvastatin (LIPITOR) 20 MG tablet Take 1 tablet (20 mg total) by mouth daily. 30 tablet 0  . clopidogrel (PLAVIX) 75 MG tablet Take 1 tablet (75 mg total) by mouth daily. 30 tablet 0  . Coenzyme Q10 (CO Q 10 PO) Take 1 capsule by mouth daily.     . COSOPT PF 22.3-6.8 MG/ML SOLN Place 1 drop into both eyes 2 (two) times daily.     . Cranberry 400 MG CAPS Take 1 capsule by mouth daily.    Marland Kitchen diltiazem (DILACOR XR) 240 MG 24 hr capsule Take 1 capsule (240 mg total) by mouth daily. 90 capsule 3  . docusate sodium (COLACE) 100 MG capsule Take 200 mg by mouth daily.    Marland Kitchen esomeprazole (NEXIUM) 40 MG capsule TAKE 1 CAPSULE TWICE DAILY BEFORE MEALS 60 capsule 0  . fish oil-omega-3 fatty acids 1000 MG capsule Take 1 g by mouth 2 (two) times daily.     . Glucosamine-Chondroitin (MOVE FREE PO) Take 1 tablet by mouth daily.    Marland Kitchen latanoprost (XALATAN) 0.005 % ophthalmic solution Place 1 drop into both eyes at bedtime.    Marland Kitchen losartan-hydrochlorothiazide (HYZAAR) 100-25 MG tablet Take 1 tablet by mouth daily.    . Misc Natural Products (PROSTATE HEALTH) CAPS Take 1 capsule by mouth daily.    . multivitamin (THERAGRAN) per tablet Take 1 tablet by mouth daily.    . nitroGLYCERIN (NITROSTAT) 0.4 MG SL tablet Place 1 tablet (0.4 mg total) under the tongue every 5 (five) minutes as needed for chest pain. 10 tablet 1  . oxymetazoline (AFRIN) 0.05 % nasal spray Place 1 spray into both nostrils daily as needed for congestion.    . Probiotic Product (ALIGN PO) Take 1 tablet by mouth daily. Take daily     . RHOPRESSA 0.02 % SOLN Place 1 drop into both eyes at bedtime.     . valACYclovir (VALTREX) 1000 MG tablet Take  1,000 mg by mouth daily as needed (outbreak).      No current facility-administered medications for this visit.     Allergies:   Brimonidine tartrate-timolol    Social History:  The patient  reports that he quit smoking about 53 years ago. His smoking use included cigarettes. He has never used smokeless tobacco. He reports current alcohol use. He reports that he does not use drugs.  Family History:  The patient's family history includes Breast cancer in his sister; Colon cancer in his paternal uncle and another family member; Diabetes in his sister; High blood pressure in his sister; Mitral valve prolapse in an other family member; Stroke in his father, mother, and sister.    ROS:  Please see the history of present illness.  Otherwise, review of systems are positive for none.   All other systems are reviewed and negative.    PHYSICAL EXAM: VS:  BP (!) 148/72   Pulse 61   Ht 6\' 2"  (1.88 m)   Wt 189 lb (85.7 kg)   SpO2 98%   BMI 24.27 kg/m  , BMI Body mass index is 24.27 kg/m.   General: Well developed, well nourished, NAD Neck: Negative for carotid bruits. No JVD Lungs:Clear to ausculation bilaterally. No wheezes, rales, or rhonchi. Breathing is unlabored. Cardiovascular: RRR with S1 S2. No murmurs Extremities: No edema. No clubbing or cyanosis. DP/PT pulses 2+ bilaterally Neuro: Alert and oriented. No focal deficits. No facial asymmetry. MAE spontaneously. Psych: Responds to questions appropriately with normal affect.      E KG:  EKG is not ordered today.   Recent Labs: 08/18/2019: ALT 19 08/25/2019: Hemoglobin 14.3; Platelets 224 08/26/2019: BUN 12; Creatinine, Ser 0.80; Potassium 3.4; Sodium 138    Lipid Panel    Component Value Date/Time   CHOL 123 08/18/2019 0550   TRIG 54 08/18/2019 0550   HDL 37 (L) 08/18/2019 0550   CHOLHDL 3.3 08/18/2019 0550   VLDL 11 08/18/2019 0550   LDLCALC 75 08/18/2019 0550      Wt Readings from Last 3 Encounters:  08/28/19 189 lb  (85.7 kg)  08/25/19 183 lb 10.3 oz (83.3 kg)  08/18/19 188 lb 0.8 oz (85.3 kg)     Other studies Reviewed: Additional studies/ records that were reviewed today include:   Myoview stress test 03/26/2018:   Nuclear stress EF: 71%.  The study is normal.  This is a low risk study.  The left ventricular ejection fraction is hyperdynamic (>65%).  There was no ST segment deviation noted during stress. BP did drop in Stage 3 but likely erroneous as he had a hypertensive BP response in recovery.  Echocardiogram 08/18/2019:   1. The left ventricle has hyperdynamic systolic function, with an ejection fraction of >65%. The cavity size was normal. There is moderately increased left ventricular wall thickness. Left ventricular diastolic Doppler parameters are consistent with  impaired relaxation.  2. The right ventricle has normal systolic function. The cavity was normal. There is no increase in right ventricular wall thickness.  3. Mild calcification of the mitral valve leaflet. There is mild mitral annular calcification present. No evidence of mitral valve stenosis. No significant mitral regurgitation.  4. The aortic valve is tricuspid. Mild calcification of the aortic valve. No stenosis of the aortic valve.  5. The inferior vena cava was dilated in size with <50% respiratory variability. No complete TR doppler jet so unable to estimate PA systolic pressure.  6. The aortic root is normal in size and structure.  7. Trivial pericardial effusion is present.  ASSESSMENT AND PLAN:  1. Atypical chest pain: -Likely chest pain is secondary to hypertension and stress given that his wife just recently passed away 3 days ago.  He underwent a Myoview stress test last year that was low risk, negative with no ischemia.  Had negative high-sensitivity troponin and no EKG changes during hospitalization therefore no further ischemic work-up was indicated  p -No recurrent symptoms since discharge -Continue ASA,  statin -Placed on Plavix for recent TIA 08/25/2019  2. HTN: -More stable, 148/72 today however patient reports lower BPs after walking at home.  Would not chase hypertension at this time given significant stressors in his life. -Continue diltiazem, hydrochlorothiazide, losartan -Hypokalemia during hospitalization, 3.4.  We will recheck labs today and may add low-dose K. Dur if continues to be low   3. Hx of palpitations: -Per chart review, does not have a documented hx of atrial fibrillation -Was placed on Diltiazem for palpations with resolution -Continue current dose at 240mg  QD  -We will place 30-day event monitor in the setting of recent TIA.  There is no documented atrial fibrillation on file however there is high suspicion given history of palpitations and recent TIA.  4. Hypokalemia: -K+ 3.4 on hospital discharge -We will repeat labs today and may need to add low-dose K. Dur 10 mEq daily  5. HLD: -LDL note to be 75mg /dl  -Goal <70 for aggressive CV risk factor reduction  -Statin titrated during hospitalization for TIA -Recheck labs in 6 to 8 weeks with LFTs  6.  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  -Given this, [facility] was consulted who recommended follow up CT angiogram of the abdomen and pelvis in 6 months and close follow up.   -CT due approximately 01/2020   Current medicines are reviewed at length with the patient today.  The patient does not have concerns regarding medicines.  The following changes have been made:  no change  Labs/ tests ordered today include: 30-day event monitor to exclude AF   Orders Placed This Encounter  Procedures  . Basic metabolic panel  . CARDIAC EVENT MONITOR    Disposition:    FU with APP after event monitor in 1 month  Signed, Kathyrn Drown, NP  08/28/2019 8:38 AM    [facility] [place], [place], [place]  [address] Phone: [phone]; Fax: [phone]

## 2019-08-25 ENCOUNTER — Observation Stay (HOSPITAL_COMMUNITY)
Admission: EM | Admit: 2019-08-25 | Discharge: 2019-08-26 | Disposition: A | Discharge status: Home / Self Care | Payer: PPO | Attending: Internal Medicine | Admitting: Internal Medicine

## 2019-08-25 ENCOUNTER — Emergency Department (HOSPITAL_COMMUNITY): Payer: PPO

## 2019-08-25 ENCOUNTER — Observation Stay (HOSPITAL_COMMUNITY): Payer: PPO

## 2019-08-25 DIAGNOSIS — Z8673 Personal history of transient ischemic attack (TIA), and cerebral infarction without residual deficits: Secondary | ICD-10-CM | POA: Diagnosis present

## 2019-08-25 DIAGNOSIS — Z7982 Long term (current) use of aspirin: Secondary | ICD-10-CM | POA: Insufficient documentation

## 2019-08-25 DIAGNOSIS — I708 Atherosclerosis of other arteries: Secondary | ICD-10-CM | POA: Diagnosis not present

## 2019-08-25 DIAGNOSIS — K219 Gastro-esophageal reflux disease without esophagitis: Secondary | ICD-10-CM | POA: Diagnosis not present

## 2019-08-25 DIAGNOSIS — Z87891 Personal history of nicotine dependence: Secondary | ICD-10-CM | POA: Insufficient documentation

## 2019-08-25 DIAGNOSIS — I10 Essential (primary) hypertension: Secondary | ICD-10-CM | POA: Diagnosis not present

## 2019-08-25 DIAGNOSIS — G459 Transient cerebral ischemic attack, unspecified: Secondary | ICD-10-CM | POA: Diagnosis not present

## 2019-08-25 DIAGNOSIS — G4733 Obstructive sleep apnea (adult) (pediatric): Secondary | ICD-10-CM | POA: Diagnosis not present

## 2019-08-25 DIAGNOSIS — R200 Anesthesia of skin: Secondary | ICD-10-CM

## 2019-08-25 DIAGNOSIS — I6523 Occlusion and stenosis of bilateral carotid arteries: Secondary | ICD-10-CM | POA: Diagnosis not present

## 2019-08-25 DIAGNOSIS — R001 Bradycardia, unspecified: Secondary | ICD-10-CM | POA: Diagnosis not present

## 2019-08-25 LAB — BASIC METABOLIC PANEL
Anion gap: 7 (ref 5–15)
BUN: 13 mg/dL (ref 8–23)
CO2: 29 mmol/L (ref 22–32)
Calcium: 9.2 mg/dL (ref 8.9–10.3)
Chloride: 103 mmol/L (ref 98–111)
Creatinine, Ser: 0.94 mg/dL (ref 0.61–1.24)
GFR calc Af Amer: >60 mL/min (ref >60)
GFR calc non Af Amer: >60 mL/min (ref >60)
Glucose, Bld: 120 mg/dL — ABNORMAL HIGH (ref 70–99)
Potassium: 3.5 mmol/L (ref 3.5–5.1)
Sodium: 139 mmol/L (ref 135–145)

## 2019-08-25 LAB — CBC WITH DIFFERENTIAL/PLATELET
Abs Immature Granulocytes: 0.02 10*3/uL (ref 0.00–0.07)
Basophils Absolute: 0.1 10*3/uL (ref 0.0–0.1)
Basophils Relative: 1 %
Eosinophils Absolute: 0.2 10*3/uL (ref 0.0–0.5)
Eosinophils Relative: 3 %
HCT: 44.6 % (ref 39.0–52.0)
Hemoglobin: 14.3 g/dL (ref 13.0–17.0)
Immature Granulocytes: 0 %
Lymphocytes Relative: 20 %
Lymphs Abs: 1.7 10*3/uL (ref 0.7–4.0)
MCH: 31.3 pg (ref 26.0–34.0)
MCHC: 32.1 g/dL (ref 30.0–36.0)
MCV: 97.6 fL (ref 80.0–100.0)
Monocytes Absolute: 0.8 10*3/uL (ref 0.1–1.0)
Monocytes Relative: 10 %
Neutro Abs: 5.5 10*3/uL (ref 1.7–7.7)
Neutrophils Relative %: 66 %
Platelets: 224 10*3/uL (ref 150–400)
RBC: 4.57 MIL/uL (ref 4.22–5.81)
RDW: 13.4 % (ref 11.5–15.5)
WBC: 8.4 10*3/uL (ref 4.0–10.5)
nRBC: 0 % (ref 0.0–0.2)

## 2019-08-25 LAB — TROPONIN I (HIGH SENSITIVITY)
Troponin I (High Sensitivity): 6 ng/L (ref <18)
Troponin I (High Sensitivity): 5 ng/L (ref <18)

## 2019-08-25 MED ORDER — PANTOPRAZOLE SODIUM 40 MG PO TBEC
40.0000 mg | DELAYED_RELEASE_TABLET | Freq: Every day | ORAL | Status: DC
  Administered 2019-08-26: 11:00:00 40 mg via ORAL
  Filled 2019-08-25: qty 1

## 2019-08-25 MED ORDER — STROKE: EARLY STAGES OF RECOVERY BOOK
Freq: Once | Status: AC
  Administered 2019-08-25: 18:00:00

## 2019-08-25 MED ORDER — STROKE: EARLY STAGES OF RECOVERY BOOK
Freq: Once | Status: AC
  Filled 2019-08-25: qty 1

## 2019-08-25 MED ORDER — MELATONIN 3 MG PO TABS
6.0000 mg | ORAL_TABLET | Freq: Every evening | ORAL | Status: DC | PRN
  Administered 2019-08-26: 6 mg via ORAL
  Filled 2019-08-25 (×2): qty 2

## 2019-08-25 MED ORDER — ATORVASTATIN CALCIUM 10 MG PO TABS: 20.0000 mg | ORAL_TABLET | Freq: Every day | ORAL | Status: DC

## 2019-08-25 MED ORDER — IOHEXOL 350 MG/ML SOLN
75.0000 mL | Freq: Once | INTRAVENOUS | Status: AC | PRN
  Administered 2019-08-25: 20:00:00 75 mL via INTRAVENOUS

## 2019-08-25 MED ORDER — ACETAMINOPHEN 650 MG RE SUPP: 650.0000 mg | RECTAL | Status: DC | PRN

## 2019-08-25 MED ORDER — ACETAMINOPHEN 325 MG PO TABS: 650.0000 mg | ORAL_TABLET | ORAL | Status: DC | PRN

## 2019-08-25 MED ORDER — ASPIRIN 81 MG PO CHEW: 81.0000 mg | CHEWABLE_TABLET | Freq: Every day | ORAL | Status: DC

## 2019-08-25 MED ORDER — ENOXAPARIN SODIUM 40 MG/0.4ML ~~LOC~~ SOLN
40.0000 mg | SUBCUTANEOUS | Status: DC
  Administered 2019-08-25 – 2019-08-26 (×2): 40 mg via SUBCUTANEOUS
  Filled 2019-08-25 (×2): qty 0.4

## 2019-08-25 MED ORDER — ASPIRIN 325 MG PO TABS: 325.0000 mg | ORAL_TABLET | Freq: Every day | ORAL | Status: DC

## 2019-08-25 MED ORDER — ASPIRIN 325 MG PO TABS
325.0000 mg | ORAL_TABLET | Freq: Every day | ORAL | Status: DC
  Administered 2019-08-25 – 2019-08-26 (×2): 325 mg via ORAL
  Filled 2019-08-25 (×2): qty 1

## 2019-08-25 MED ORDER — ATORVASTATIN CALCIUM 80 MG PO TABS
80.0000 mg | ORAL_TABLET | Freq: Every day | ORAL | Status: DC
  Administered 2019-08-25: 80 mg via ORAL
  Filled 2019-08-25: qty 1

## 2019-08-25 MED ORDER — ACETAMINOPHEN 160 MG/5ML PO SOLN: 650.0000 mg | ORAL | Status: DC | PRN

## 2019-08-25 MED ORDER — ASPIRIN 300 MG RE SUPP: 300.0000 mg | Freq: Every day | RECTAL | Status: DC

## 2019-08-25 MED ORDER — ZOLPIDEM TARTRATE 5 MG PO TABS
5.0000 mg | ORAL_TABLET | Freq: Once | ORAL | Status: DC
  Filled 2019-08-25: qty 1

## 2019-08-25 NOTE — ED Provider Notes (Signed)
[facility]ENC[facility] DEPARTMENT Provider Note   CSN: [id number] Arrival date & time: 08/25/19  1158     History   Chief Complaint Chief Complaint  Patient presents with  . Numbness    HPI Jonathan Lane is a 78 y.o. male.     The history is provided by the patient and medical records. No language interpreter was used.  Neurologic Problem This is a new problem. The current episode started 1 to 2 hours ago. The problem occurs rarely. The problem has been resolved. Pertinent negatives include no chest pain, no abdominal pain, no headaches and no shortness of breath. Nothing aggravates the symptoms. Nothing relieves the symptoms. He has tried nothing for the symptoms. The treatment provided no relief.    Past Medical History:  Diagnosis Date  . Atrial fibrillation ([place])   . Bradycardia   . Bradycardia   . Cardiac murmur   . Dizziness   . Glaucoma   . Heart palpitations   . Hyperlipidemia   . Hypertension   . Skin abnormalities    pre cancerous lesion R hand  . Sleep apnea    uses cpap  . Snoring     Patient Active Problem List   Diagnosis Date Noted  . Hypokalemia 08/18/2019  . Obstructive sleep apnea 01/22/2019  . Gastroesophageal reflux disease 05/22/2018  . Sore throat 05/22/2018  . RLQ abdominal pain 03/15/2018  . Chest pain 01/29/2018  . OSA on CPAP 10/11/2015  . 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 Snoring   . ATRIAL FIBRILLATION 08/31/2009  . CARDIAC MURMUR 08/31/2009  . Elevated lipids 06/02/2009  . Essential hypertension 06/02/2009  . DIZZINESS 06/02/2009  . PALPITATIONS 06/02/2009    Past Surgical History:  Procedure Laterality Date  . APPENDECTOMY  1965  . COLONOSCOPY    . POLYPECTOMY    . TONSILLECTOMY     as a child        Home Medications    Prior to Admission medications   Medication Sig Start Date End Date Taking? Authorizing Provider  amoxicillin (AMOXIL) 500  MG capsule Take 500 mg by mouth 2 (two) times daily.    [provider]  Apoaequorin (PREVAGEN PO) Take 1 capsule by mouth daily. [facility] and memory.     [provider]  aspirin 81 MG tablet Take 81 mg by mouth every evening.     [provider]  atorvastatin (LIPITOR) 10 MG tablet Take 1 tablet (10 mg total) by mouth daily. 04/10/19   Josue Hector, MD  Coenzyme Q10 (CO Q 10 PO) Take 1 capsule by mouth daily.     [provider]  COSOPT PF 22.3-6.8 MG/ML SOLN Place 1 drop into both eyes 2 (two) times daily.  12/28/11   [provider]  Cranberry 400 MG CAPS Take 1 capsule by mouth daily.    [provider]  diltiazem (DILACOR XR) 240 MG 24 hr capsule Take 1 capsule (240 mg total) by mouth daily. 04/10/19   Josue Hector, MD  docusate sodium (COLACE) 100 MG capsule Take 200 mg by mouth daily.    [provider]  esomeprazole (NEXIUM) 40 MG capsule TAKE 1 CAPSULE TWICE DAILY BEFORE MEALS Patient taking differently: Take 40 mg by mouth 2 (two) times daily.  08/07/19   Ladene Artist, MD  fish oil-omega-3 fatty acids 1000 MG capsule Take 1 g by mouth 2 (two) times daily.  [provider]  Glucosamine-Chondroitin (MOVE FREE PO) Take 1 tablet by mouth daily.    [provider]  latanoprost (XALATAN) 0.005 % ophthalmic solution Place 1 drop into both eyes at bedtime.    [provider]  losartan-hydrochlorothiazide (HYZAAR) 100-25 MG tablet Take 1 tablet by mouth daily. 04/10/19   [provider]  Misc Natural Products ([place]) CAPS Take 1 capsule by mouth daily.    [provider]  multivitamin [facility]) per tablet Take 1 tablet by mouth daily.    [provider]  nitroGLYCERIN (NITROSTAT) 0.4 MG SL tablet Place 1 tablet (0.4 mg total) under the tongue every 5 (five) minutes as needed for chest pain. 08/18/19   Regalado, Belkys A, MD  oxymetazoline (AFRIN) 0.05 % nasal  spray Place 1 spray into both nostrils daily as needed for congestion.    [provider]  Probiotic Product (ALIGN PO) Take by mouth. Take daily    [provider]  RHOPRESSA 0.02 % SOLN Place 1 drop into both eyes at bedtime.  01/08/18   [provider]  valACYclovir (VALTREX) 1000 MG tablet Take 1,000 mg by mouth daily as needed (outbreak).     [provider]    Family History Family History  Problem Relation Age of Onset  . Stroke Father   . Stroke Mother   . Stroke Sister   . Breast cancer Sister   . High blood pressure Sister   . Diabetes Sister   . Colon cancer Other        Uncle  . Colon cancer Paternal Uncle   . Mitral valve prolapse Other        sugery 09-27-2017  . Rectal cancer Neg Hx   . Stomach cancer Neg Hx     Social History Social History   Tobacco Use  . Smoking status: Former Smoker    Types: Cigarettes    Quit date: 12/18/1965    Years since quitting: 53.7  . Smokeless tobacco: Never Used  Substance Use Topics  . Alcohol use: Yes    Comment: 5-7 drinks per week  . Drug use: No     Allergies   Brimonidine tartrate-timolol   Review of Systems Review of Systems  Constitutional: Negative for chills, diaphoresis, fatigue and fever.  HENT: Negative for congestion.   Eyes: Negative for visual disturbance.  Respiratory: Negative for cough, chest tightness, shortness of breath, wheezing and stridor.   Cardiovascular: Negative for chest pain, palpitations and leg swelling.  Gastrointestinal: Negative for abdominal pain, diarrhea, nausea and vomiting.  Genitourinary: Negative for flank pain and frequency.  Musculoskeletal: Negative for back pain, neck pain and neck stiffness.  Skin: Negative for rash and wound.  Neurological: Positive for numbness. Negative for dizziness, facial asymmetry, weakness, light-headedness and headaches.  Psychiatric/Behavioral: Negative for agitation and confusion.  All other systems  reviewed and are negative.    Physical Exam Updated Vital Signs BP (!) 142/79   Pulse (!) 49   Temp 97.7 F (36.5 C) (Oral)   Resp 18   SpO2 96%   Physical Exam Vitals signs and nursing note reviewed.  Constitutional:      General: He is not in acute distress.    Appearance: He is well-developed. He is not ill-appearing, toxic-appearing or diaphoretic.  HENT:     Head: Normocephalic and atraumatic.     Nose: No congestion or rhinorrhea.     Mouth/Throat:     Pharynx: No oropharyngeal exudate or posterior  oropharyngeal erythema.  Eyes:     Extraocular Movements: Extraocular movements intact.     Conjunctiva/sclera: Conjunctivae normal.     Pupils: Pupils are equal, round, and reactive to light.  Neck:     Musculoskeletal: Neck supple.  Cardiovascular:     Rate and Rhythm: Normal rate and regular rhythm.     Pulses: Normal pulses.     Heart sounds: No murmur.  Pulmonary:     Effort: Pulmonary effort is normal. No respiratory distress.     Breath sounds: Normal breath sounds. No wheezing, rhonchi or rales.  Chest:     Chest wall: No tenderness.  Abdominal:     General: Abdomen is flat.     Palpations: Abdomen is soft.     Tenderness: There is no abdominal tenderness. There is no right CVA tenderness or left CVA tenderness.  Musculoskeletal:     Right lower leg: No edema.     Left lower leg: No edema.  Skin:    General: Skin is warm and dry.     Capillary Refill: Capillary refill takes less than 2 seconds.  Neurological:     General: No focal deficit present.     Mental Status: He is alert and oriented to person, place, and time.     Sensory: No sensory deficit.     Motor: No weakness.     Coordination: Coordination normal.     Gait: Gait normal.  Psychiatric:        Mood and Affect: Mood normal.      ED Treatments / Results  Labs (all labs ordered are listed, but only abnormal results are displayed) Labs Reviewed  BASIC METABOLIC PANEL - Abnormal; Notable  for the following components:      Result Value   Glucose, Bld 120 (*)    All other components within normal limits  CBC WITH DIFFERENTIAL/PLATELET  URINALYSIS, COMPLETE (UACMP) WITH MICROSCOPIC  TROPONIN I (HIGH SENSITIVITY)  TROPONIN I (HIGH SENSITIVITY)    EKG EKG Interpretation  Date/Time:  Monday August 25 2019 12:16:01 EDT Ventricular Rate:  52 PR Interval:  194 QRS Duration: 78 QT Interval:  432 QTC Calculation: 401 R Axis:   35 Text Interpretation:  Sinus bradycardia with Premature atrial complexes Anterior infarct , age undetermined Abnormal ECG No acute STEMI Confirmed by Madalyn Rob [id number]) on 08/25/2019 12:21:33 PM   Radiology Dg Chest 2 View  Result Date: 08/25/2019 CLINICAL DATA:  Pt complaining of left jaw/face numbness today - pt states he does not have any CP or SOB - hx of stroke, AFIB, htn, sleep apnea, bradycardia EXAM: CHEST - 2 VIEW COMPARISON:  Chest radiograph 08/17/2019 FINDINGS: Stable cardiomediastinal contours. There are aortic arch calcifications. The lungs are clear. No pneumothorax or pleural effusion. No acute finding in the visualized skeleton. IMPRESSION: No active cardiopulmonary disease. Electronically Signed   By: Audie Pinto M.D.   On: 08/25/2019 13:32   Ct Head Wo Contrast  Result Date: 08/25/2019 CLINICAL DATA:  Left face and arm numbness. EXAM: CT HEAD WITHOUT CONTRAST TECHNIQUE: Contiguous axial images were obtained from the base of the skull through the vertex without intravenous contrast. COMPARISON:  None. FINDINGS: Brain: No evidence for acute hemorrhage, mass lesion, midline shift, hydrocephalus or large infarct. Scattered areas of low-density in the periventricular white matter. 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 Vascular: No hyperdense vessel or unexpected calcification. Skull: Normal. Negative for fracture or focal lesion. Sinuses/Orbits: Mild disease in  the right maxillary sinus.  Other: None. IMPRESSION: 1. No acute intracranial abnormality. 2. Scattered areas of low-density in the periventricular white matter and left basal ganglia. 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 Electronically Signed   By: Markus Daft M.D.   On: 08/25/2019 14:15    Procedures Procedures (including critical care time)  Medications Ordered in ED Medications   stroke: mapping our early stages of recovery book (has no administration in time range)  acetaminophen (TYLENOL) tablet 650 mg (has no administration in time range)    Or  acetaminophen (TYLENOL) solution 650 mg (has no administration in time range)    Or  acetaminophen (TYLENOL) suppository 650 mg (has no administration in time range)  enoxaparin (LOVENOX) injection 40 mg (has no administration in time range)  atorvastatin (LIPITOR) tablet 80 mg (has no administration in time range)  pantoprazole (PROTONIX) EC tablet 40 mg (has no administration in time range)   stroke: mapping our early stages of recovery book (has no administration in time range)  aspirin suppository 300 mg (has no administration in time range)    Or  aspirin tablet 325 mg (has no administration in time range)     Initial Impression / Assessment and Plan / ED Course  I have reviewed the triage vital signs and the nursing notes.  Pertinent labs & imaging results that were available during my care of the patient were reviewed by me and considered in my medical decision making (see chart for details).        Jonathan Lane is a 78 y.o. male history significant for hypertension, paroxysmal atrial fibrillation, lipidemia, and GERD who presents with left sided numbness.  He reports that around 10 AM, he noticed numbness in his left face and left arm.  Lasted for approximately 2 hours before resolving on arrival urgency department.  He denies any weakness or facial droop.  Denies recent trauma.  Recent medication  changes.  He denies any nausea vomiting, urinary symptoms or GI symptoms.  No headaches, double vision, or blurry vision.  Denies difficulty with speaking or with ambulation.  He is never had this before and is concerned he may have had a stroke.  On exam, no focal neurologic.  Good finger-nose-finger testing bilaterally, normal extraocular movements and pupil exam.  Speech is clear with good object identification.  Good sensation strength in all extremities.  No numbness present in the face or arm on my exam.  Spoke with neurology who recommends CT initially and then admission for likely TIA a/stroke work-up.  Patient will be admitted to medicine for further work-up.    Final Clinical Impressions(s) / ED Diagnoses   Final diagnoses:  Numbness    Clinical Impression: 1. Numbness     Disposition: Admit  This note was prepared with assistance of Dragon voice recognition software. Occasional wrong-word or sound-a-like substitutions may have occurred due to the inherent limitations of voice recognition software.      Tegeler, Gwenyth Allegra, MD 08/25/19 [phone]

## 2019-08-25 NOTE — H&P (Signed)
History and Physical    Jonathan Lane [id number] DOB: December 15, 1941 DOA: 08/25/2019  PCP: Jonathan Smoker, MD  Patient coming from: Home I have personally briefly reviewed patient's old medical records in [place]  Chief Complaint: Left sided facial numbness and left upper arm numbness started this morning.  HPI: Jonathan Lane is a 78 y.o. male with medical history significant of hypertension, hyperlipidemia, paroxysmal A. Fib (not on anticoagulation), obstructive sleep apnea on CPAP presents to emergency department with complaining of left-sided facial and upper arm numbness which started suddenly this morning 10:00 am while he was getting ready to go out for walk.  Denies previous history of similar symptoms, headache, blurry vision, loss of consciousness, seizures, lightheadedness, dizziness, chest pain, shortness of breath, palpitation, leg swelling, fever, chills, N/V/D, sleep or appetite changes.  He reports that he only takes baby aspirin for A. Fib, had transthoracic echo on 08/18/2019.  He lives alone at home.  His wife died recently, he is independent on daily life activities.  Denies smoking, illicit drug use and drinks alcohol occasionally.  ED Course: Alert and oriented x3, communicating well.  Reports that his left-sided facial swelling improved significantly and left arm numbness has resolved.  CT head came back negative for acute findings.  Troponin negative, EKG: Normal sinus rhythm.  Evaluated by neurology-CTA head and neck and MRI brain without contrast ordered and is pending.  Review of Systems: As per HPI otherwise negative.    Past Medical History:  Diagnosis Date  . Atrial fibrillation ([place])   . Bradycardia   . Bradycardia   . Cardiac murmur   . Dizziness   . Glaucoma   . Heart palpitations   . Hyperlipidemia   . Hypertension   . Skin abnormalities    pre cancerous lesion R hand  . Sleep apnea    uses cpap  . Snoring     Past Surgical  History:  Procedure Laterality Date  . APPENDECTOMY  1965  . COLONOSCOPY    . POLYPECTOMY    . TONSILLECTOMY     as a child     reports that he quit smoking about 53 years ago. His smoking use included cigarettes. He has never used smokeless tobacco. He reports current alcohol use. He reports that he does not use drugs.  Allergies  Allergen Reactions  . Brimonidine Tartrate-Timolol Itching    Itching eyes Itching eyes, rash    Family History  Problem Relation Age of Onset  . Stroke Father   . Stroke Mother   . Stroke Sister   . Breast cancer Sister   . High blood pressure Sister   . Diabetes Sister   . Colon cancer Other        Uncle  . Colon cancer Paternal Uncle   . Mitral valve prolapse Other        sugery 09-27-2017  . Rectal cancer Neg Hx   . Stomach cancer Neg Hx     Prior to Admission medications   Medication Sig Start Date End Date Taking? Authorizing Provider  Apoaequorin (PREVAGEN PO) Take 1 capsule by mouth daily. [facility].    Yes [provider]  aspirin 81 MG tablet Take 81 mg by mouth every evening.    Yes [provider]  atorvastatin (LIPITOR) 10 MG tablet Take 1 tablet (10 mg total) by mouth daily. 04/10/19  Yes Josue Hector, MD  Coenzyme Q10 (CO Q 10 PO) Take 1 capsule  by mouth daily.    Yes [provider]  COSOPT PF 22.3-6.8 MG/ML SOLN Place 1 drop into both eyes 2 (two) times daily.  12/28/11  Yes [provider]  Cranberry 400 MG CAPS Take 1 capsule by mouth daily.   Yes [provider]  diltiazem (DILACOR XR) 240 MG 24 hr capsule Take 1 capsule (240 mg total) by mouth daily. 04/10/19  Yes Josue Hector, MD  docusate sodium (COLACE) 100 MG capsule Take 200 mg by mouth daily.   Yes [provider]  esomeprazole (NEXIUM) 40 MG capsule TAKE 1 CAPSULE TWICE DAILY BEFORE MEALS Patient taking differently: Take 40 mg by mouth 2 (two) times daily.  08/07/19  Yes Ladene Artist, MD   fish oil-omega-3 fatty acids 1000 MG capsule Take 1 g by mouth 2 (two) times daily.    Yes [provider]  Glucosamine-Chondroitin (MOVE FREE PO) Take 1 tablet by mouth daily.   Yes [provider]  latanoprost (XALATAN) 0.005 % ophthalmic solution Place 1 drop into both eyes at bedtime.   Yes [provider]  losartan-hydrochlorothiazide (HYZAAR) 100-25 MG tablet Take 1 tablet by mouth daily. 04/10/19  Yes [provider]  Misc Natural Products ([place]) CAPS Take 1 capsule by mouth daily.   Yes [provider]  multivitamin [facility]) per tablet Take 1 tablet by mouth daily.   Yes [provider]  nitroGLYCERIN (NITROSTAT) 0.4 MG SL tablet Place 1 tablet (0.4 mg total) under the tongue every 5 (five) minutes as needed for chest pain. 08/18/19  Yes Regalado, Belkys A, MD  oxymetazoline (AFRIN) 0.05 % nasal spray Place 1 spray into both nostrils daily as needed for congestion.   Yes [provider]  Probiotic Product (ALIGN PO) Take 1 tablet by mouth daily. Take daily    Yes [provider]  RHOPRESSA 0.02 % SOLN Place 1 drop into both eyes at bedtime.  01/08/18  Yes [provider]  valACYclovir (VALTREX) 1000 MG tablet Take 1,000 mg by mouth daily as needed (outbreak).    Yes [provider]  amoxicillin (AMOXIL) 500 MG capsule Take 500 mg by mouth 2 (two) times daily.    [provider]    Physical Exam: Vitals:   08/25/19 1202 08/25/19 1245 08/25/19 1454 08/25/19 1511  BP: (!) 150/71 (!) 142/79  (!) 157/76  Pulse: 65 (!) 49  (!) 55  Resp: 18   18  Temp: 97.7 F (36.5 C)     TempSrc: Oral     SpO2: 97% 96% 97% 98%    Constitutional: NAD, calm, comfortable Vitals:   08/25/19 1202 08/25/19 1245 08/25/19 1454 08/25/19 1511  BP: (!) 150/71 (!) 142/79  (!) 157/76  Pulse: 65 (!) 49  (!) 55  Resp: 18   18  Temp: 97.7 F (36.5 C)     TempSrc: Oral     SpO2: 97% 96% 97% 98%    Constitutional: Alert and oriented x3, communicating well, not in no acute distress.   Eyes: PERRL, lids and conjunctivae normal ENMT: Mucous membranes are moist. Posterior pharynx clear of any exudate or lesions.Normal dentition.  Neck: normal, supple, no masses, no thyromegaly Respiratory: clear to auscultation bilaterally, no wheezing, no crackles. Normal respiratory effort. No accessory muscle use.  Cardiovascular: Regular rate and rhythm, no murmurs / rubs / gallops. No extremity edema. 2+ pedal pulses. No carotid bruits.  Abdomen: no tenderness, no masses palpated. No hepatosplenomegaly. Bowel sounds positive.  Musculoskeletal: no clubbing / cyanosis. No joint deformity upper and lower extremities. Good ROM, no contractures. Normal muscle tone.  Skin: no rashes, lesions, ulcers. No induration Neurologic: CN 2-12 grossly intact. Sensation intact, DTR normal. Strength 5/5 in all 4.  Psychiatric: Normal judgment and insight. Alert and oriented x 3. Normal mood.    Labs on Admission: I have personally reviewed following labs and imaging studies  CBC: Recent Labs  Lab 08/25/19 1212  WBC 8.4  NEUTROABS 5.5  HGB 14.3  HCT 44.6  MCV 97.6  PLT [id number]   Basic Metabolic Panel: Recent Labs  Lab 08/25/19 1212  NA 139  K 3.5  CL 103  CO2 29  GLUCOSE 120*  BUN 13  CREATININE 0.94  CALCIUM 9.2   GFR: Estimated Creatinine Clearance: 75.3 mL/min (by C-G formula based on SCr of 0.94 mg/dL). Liver Function Tests: No results for input(s): AST, ALT, ALKPHOS, BILITOT, PROT, ALBUMIN in the last 168 hours. No results for input(s): LIPASE, AMYLASE in the last 168 hours. No results for input(s): AMMONIA in the last 168 hours. Coagulation Profile: No results for input(s): INR, PROTIME in the last 168 hours. Cardiac Enzymes: No results for input(s): CKTOTAL, CKMB, CKMBINDEX, TROPONINI in the last 168 hours. BNP (last 3 results) No results for input(s): PROBNP in the last 8760 hours. HbA1C:  No results for input(s): HGBA1C in the last 72 hours. CBG: No results for input(s): GLUCAP in the last 168 hours. Lipid Profile: No results for input(s): CHOL, HDL, LDLCALC, TRIG, CHOLHDL, LDLDIRECT in the last 72 hours. Thyroid Function Tests: No results for input(s): TSH, T4TOTAL, FREET4, T3FREE, THYROIDAB in the last 72 hours. Anemia Panel: No results for input(s): VITAMINB12, FOLATE, FERRITIN, TIBC, IRON, RETICCTPCT in the last 72 hours. Urine analysis: No results found for: COLORURINE, APPEARANCEUR, LABSPEC, PHURINE, GLUCOSEU, HGBUR, BILIRUBINUR, KETONESUR, PROTEINUR, UROBILINOGEN, NITRITE, LEUKOCYTESUR  Radiological Exams on Admission: Dg Chest 2 View  Result Date: 08/25/2019 CLINICAL DATA:  Pt complaining of left jaw/face numbness today - pt states he does not have any CP or SOB - hx of stroke, AFIB, htn, sleep apnea, bradycardia EXAM: CHEST - 2 VIEW COMPARISON:  Chest radiograph 08/17/2019 FINDINGS: Stable cardiomediastinal contours. There are aortic arch calcifications. The lungs are clear. No pneumothorax or pleural effusion. No acute finding in the visualized skeleton. IMPRESSION: No active cardiopulmonary disease. Electronically Signed   By: Audie Pinto M.D.   On: 08/25/2019 13:32   Ct Head Wo Contrast  Result Date: 08/25/2019 CLINICAL DATA:  Left face and arm numbness. EXAM: CT HEAD WITHOUT CONTRAST TECHNIQUE: Contiguous axial images were obtained from the base of the skull through the vertex without intravenous contrast. COMPARISON:  None. FINDINGS: Brain: No evidence for acute hemorrhage, mass lesion, midline shift, hydrocephalus or large infarct. Scattered areas of low-density in the periventricular white matter. 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 Vascular: No hyperdense vessel or unexpected calcification. Skull: Normal. Negative for fracture or focal lesion. Sinuses/Orbits: Mild disease in the right maxillary sinus. Other: None.  IMPRESSION: 1. No acute intracranial abnormality. 2. Scattered areas of low-density in the periventricular white matter and left basal ganglia. 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 Electronically Signed   By: Markus Daft M.D.   On: 08/25/2019 14:15    EKG: Sinus rhythm, no ST-T wave changes noted.  Assessment/Plan Active Problems:   Essential hypertension   OSA on CPAP   Gastroesophageal reflux disease  Obstructive sleep apnea   TIA (transient ischemic attack)    TIA: -Patient presented with left facial numbness-which improves, left arm numbness which has resolved.  Has history of A. fib, hypertension, hyperlipidemia -Place patient under observation. -N.p.o. until passes bedside swallow eval. -Consulted PT/OT/ST -Aspirin 325 mg and atorvastatin 80 mg given -CTA head, neck, MRI brain without contrast ordered -Had recent transthoracic echo on 08/18/2019 which showed ejection fraction of 65%, left ventricular diastolic parameters are consistent with impaired relaxation. -Ultrasound carotids ordered. -Neurochecks every 4 hour, on telemetry.  Reviewed recent lipid panel. -Cha2DS2VASc score: 3-Needs long term anticoagulation -Neurology on board- appreciate help.  A. fib: Rate controlled -On telemetry, continue aspirin -Need anticoagulation to prevent TIA/Stroke in future.  Hypertension: Well-controlled -We will hold antihypertensive medication at this time and monitor his blood pressure closely.  Hyperlipidemia: Reviewed recent lipid panel. -Started on atorvastatin 80 mg once daily.  GERD: Stable -Continue Protonix  Obstructive sleep apnea on CPAP at home.  DVT prophylaxis: Lovenox Code Status: Full code  family Communication: None present at bedside.  Plan of care discussed with patient in length and he verbalized understanding and agreed with it. Disposition Plan: To be determined  consults called: Neurology  Dr. Rory Percy  admission status: Observation   Mckinley Jewel MD [facility] Pager [phone]  If 7PM-7AM, please contact night-coverage [email or web address] Password [email or web address]  08/25/2019, 3:15 PM

## 2019-08-25 NOTE — Progress Notes (Signed)
Pt admitted to the unit from ED: pt alert and verbally responsive; ambulatory in room to [facility] independently. Pt oriented to the unit and room; fall/safety precaution and prevention education completed with pt; pt voices understanding and denies any questions. VSS: telemetry applied and verified with CCMD: NT called to second verify. Pt skin clean, dry and intact with no pressure ulcer or opened wounds noted; Pt in bed with call light within reach. Will closely monitor. Delia Heady RN   08/25/19 1609  Vitals  Temp 97.9 F (36.6 C)  Temp Source Oral  BP (!) 151/72  MAP (mmHg) 95  BP Location Right Arm  BP Method Automatic  Patient Position (if appropriate) Lying  Pulse Rate (!) 58  Pulse Rate Source Monitor  Resp 16  Oxygen Therapy  SpO2 99 %  O2 Device Room Air  Pain Assessment  Pain Scale 0-10  Pain Score 0

## 2019-08-25 NOTE — Consult Note (Addendum)
Neurology Consultation  Reason for Consult: Left-sided numbness Referring Physician: Dr. Harrell Gave Tegeler  CC: Left-sided numbness  History is obtained from: Patient, chart review  HPI: Jonathan Lane is a 78 y.o. male who has a past medical history of atrial fibrillation not on anticoagulation, hypertension, hyperlipidemia, sleep apnea presenting to the emergency room for evaluation of left facial and arm numbness this morning. He reports that he was in his usual state of health, getting ready to take a walk when he noticed that left side of face feels numb.  That numbness lasted for about 1 hour and has nearly completely resolved but he still says that he might not be at his complete baseline because the left side of his head feels "funny".  He also said that when he reported to the ER, he started noticing some left arm numbness that lasted only for a few minutes and then resolve spontaneously. He has never had similar symptoms in the past.  He denies any preceding sicknesses illness, fevers, chills, shortness of breath, chest pain, nausea vomiting abdominal pain, diarrhea constipation. No COVID exposure or COVID-like symptoms reported. Upon asking him why he is not on anticoagulation, with a history of atrial fibrillation, he said that he has not had any problems with atrial fibrillation and has never been put anticoagulation and has been taking a baby aspirin every day.  LKW: 9 AM tpa given?: no, symptoms completely or nearly resolved, NIH 1 Premorbid modified Rankin scale (mRS): 0 ROS: ROS was performed and is negative except as noted in the HPI.   Past Medical History:  Diagnosis Date  . Atrial fibrillation ([place])   . Bradycardia   . Bradycardia   . Cardiac murmur   . Dizziness   . Glaucoma   . Heart palpitations   . Hyperlipidemia   . Hypertension   . Skin abnormalities    pre cancerous lesion R hand  . Sleep apnea    uses cpap  . Snoring    Family History  Problem  Relation Age of Onset  . Stroke Father   . Stroke Mother   . Stroke Sister   . Breast cancer Sister   . High blood pressure Sister   . Diabetes Sister   . Colon cancer Other        Uncle  . Colon cancer Paternal Uncle   . Mitral valve prolapse Other        sugery 09-27-2017  . Rectal cancer Neg Hx   . Stomach cancer Neg Hx   Social History:   reports that he quit smoking about 53 years ago. His smoking use included cigarettes. He has never used smokeless tobacco. He reports current alcohol use. He reports that he does not use drugs.  Medications No current facility-administered medications for this encounter.   Current Outpatient Medications:  .  amoxicillin (AMOXIL) 500 MG capsule, Take 500 mg by mouth 2 (two) times daily., Disp: , Rfl:  .  Apoaequorin (PREVAGEN PO), Take 1 capsule by mouth daily. [facility] and memory. , Disp: , Rfl:  .  aspirin 81 MG tablet, Take 81 mg by mouth every evening. , Disp: , Rfl:  .  atorvastatin (LIPITOR) 10 MG tablet, Take 1 tablet (10 mg total) by mouth daily., Disp: 90 tablet, Rfl: 3 .  Coenzyme Q10 (CO Q 10 PO), Take 1 capsule by mouth daily. , Disp: , Rfl:  .  COSOPT PF 22.3-6.8 MG/ML SOLN, Place 1 drop into both eyes 2 (two)  times daily. , Disp: , Rfl:  .  Cranberry 400 MG CAPS, Take 1 capsule by mouth daily., Disp: , Rfl:  .  diltiazem (DILACOR XR) 240 MG 24 hr capsule, Take 1 capsule (240 mg total) by mouth daily., Disp: 90 capsule, Rfl: 3 .  docusate sodium (COLACE) 100 MG capsule, Take 200 mg by mouth daily., Disp: , Rfl:  .  esomeprazole (NEXIUM) 40 MG capsule, TAKE 1 CAPSULE TWICE DAILY BEFORE MEALS (Patient taking differently: Take 40 mg by mouth 2 (two) times daily. ), Disp: 60 capsule, Rfl: 0 .  fish oil-omega-3 fatty acids 1000 MG capsule, Take 1 g by mouth 2 (two) times daily. , Disp: , Rfl:  .  Glucosamine-Chondroitin (MOVE FREE PO), Take 1 tablet by mouth daily., Disp: , Rfl:  .  latanoprost (XALATAN) 0.005 % ophthalmic solution,  Place 1 drop into both eyes at bedtime., Disp: , Rfl:  .  losartan-hydrochlorothiazide (HYZAAR) 100-25 MG tablet, Take 1 tablet by mouth daily., Disp: , Rfl:  .  Misc Natural Products (PROSTATE HEALTH) CAPS, Take 1 capsule by mouth daily., Disp: , Rfl:  .  multivitamin (THERAGRAN) per tablet, Take 1 tablet by mouth daily., Disp: , Rfl:  .  nitroGLYCERIN (NITROSTAT) 0.4 MG SL tablet, Place 1 tablet (0.4 mg total) under the tongue every 5 (five) minutes as needed for chest pain., Disp: 10 tablet, Rfl: 1 .  oxymetazoline (AFRIN) 0.05 % nasal spray, Place 1 spray into both nostrils daily as needed for congestion., Disp: , Rfl:  .  Probiotic Product (ALIGN PO), Take by mouth. Take daily, Disp: , Rfl:  .  RHOPRESSA 0.02 % SOLN, Place 1 drop into both eyes at bedtime. , Disp: , Rfl:  .  valACYclovir (VALTREX) 1000 MG tablet, Take 1,000 mg by mouth daily as needed (outbreak). , Disp: , Rfl:    Exam: Current vital signs: BP (!) 142/79   Pulse (!) 49   Temp 97.7 F (36.5 C) (Oral)   Resp 18   SpO2 96%  Vital signs in last 24 hours: Temp:  [97.7 F (36.5 C)] 97.7 F (36.5 C) (09/07 1202) Pulse Rate:  [49-65] 49 (09/07 1245) Resp:  [18] 18 (09/07 1202) BP: (142-150)/(71-79) 142/79 (09/07 1245) SpO2:  [96 %-97 %] 96 % (09/07 1245) GENERAL: Awake, alert in NAD HEENT: - Normocephalic and atraumatic, dry mm, no LN++, no Thyromegally LUNGS - Clear to auscultation bilaterally with no wheezes CV - S1S2 RRR, no m/r/g, equal pulses bilaterally. ABDOMEN - Soft, nontender, nondistended with normoactive BS Ext: warm, well perfused, intact peripheral pulses, no edema  NEURO:  Mental Status: AA&Ox3  Language: speech is clear.  Naming, repetition, fluency, and comprehension intact. Cranial Nerves: PERRL EOMI, visual fields full, no facial asymmetry, facial sensation intact to light touch but says that the left side of his head feels funny, hearing reduced bilaterally, tongue/uvula/soft palate midline,  normal sternocleidomastoid and trapezius muscle strength. No evidence of tongue atrophy or fibrillations Motor: 5/5 in all fours without vertical drift Tone: is normal and bulk is normal Sensation- Intact to light touch bilaterally Coordination: FTN intact bilaterally  NIHSS-1 for subjective left head/face sensory change.   Labs I have reviewed labs in epic and the results pertinent to this consultation are:  CBC    Component Value Date/Time   WBC 8.4 08/25/2019 1212   RBC 4.57 08/25/2019 1212   HGB 14.3 08/25/2019 1212   HCT 44.6 08/25/2019 1212   PLT 224 08/25/2019 1212   MCV  97.6 08/25/2019 1212   MCH 31.3 08/25/2019 1212   MCHC 32.1 08/25/2019 1212   RDW 13.4 08/25/2019 1212   LYMPHSABS 1.7 08/25/2019 1212   MONOABS 0.8 08/25/2019 1212   EOSABS 0.2 08/25/2019 1212   BASOSABS 0.1 08/25/2019 1212    CMP     Component Value Date/Time   NA 139 08/25/2019 1212   K 3.5 08/25/2019 1212   CL 103 08/25/2019 1212   CO2 29 08/25/2019 1212   GLUCOSE 120 (H) 08/25/2019 1212   BUN 13 08/25/2019 1212   CREATININE 0.94 08/25/2019 1212   CALCIUM 9.2 08/25/2019 1212   PROT 6.8 08/18/2019 0324   ALBUMIN 3.4 (L) 08/18/2019 0324   AST 23 08/18/2019 0324   ALT 19 08/18/2019 0324   ALKPHOS 85 08/18/2019 0324   BILITOT 0.7 08/18/2019 0324   GFRNONAA >60 08/25/2019 1212   GFRAA >60 08/25/2019 1212    Imaging I have reviewed the images obtained:  CT-scan of the brain-no acute changes.  Echo 08/18/2019 IMPRESSIONS  1. The left ventricle has hyperdynamic systolic function, with an ejection fraction of >65%. The cavity size was normal. There is moderately increased left ventricular wall thickness. Left ventricular diastolic Doppler parameters are consistent with  impaired relaxation.  2. The right ventricle has normal systolic function. The cavity was normal. There is no increase in right ventricular wall thickness.  3. Mild calcification of the mitral valve leaflet. There is mild  mitral annular calcification present. No evidence of mitral valve stenosis. No significant mitral regurgitation.  4. The aortic valve is tricuspid. Mild calcification of the aortic valve. No stenosis of the aortic valve.  5. The inferior vena cava was dilated in size with <50% respiratory variability. No complete TR doppler jet so unable to estimate PA systolic pressure.  6. The aortic root is normal in size and structure.  7. Trivial pericardial effusion is present.   Assessment: 78 year old man past medical history of atrial fibrillation on anticoagulation, hypertension, hyperlipidemia, obstructive sleep apnea presented for evaluation of left-sided facial numbness and as well as left arm numbness.  Both the symptoms were mild and transient with the facial numbness still residually present on the left side of the head but the arm numbness now completely resolved. Low NIH stroke scale-hence TPA not offered. No evidence of cortical signs on exam, is not a candidate for endovascular thrombectomy.  Presentation likely consistent with a TIA in the setting of not anticoagulated atrial fibrillation versus a small stroke example a thalamic stroke with sensory deficits.  Recommendations: Admit to hospitalist under observation Telemetry Frequent neurochecks Aspirin 325 Atorvastatin 80 We will need long-term anticoagulation-discussion deferred to stroke team rounding in the morning. I did talk to him about anticoagulation and he is aware that there will be a discussion about this once all the test results come back. MRI brain without contrast No need to repeat 2D echo. Completed 08/18/2019 CTA head and neck Hemoglobin A1c Fasting lipid panel PT OT Speech therapy N.p.o. until cleared by bedside swallow formal swallow evaluation Stroke neurology will follow with you.   -- Amie Portland, MD [facility] Pager: [phone] If 7pm to 7am, please call on call as listed on AMION.

## 2019-08-25 NOTE — ED Triage Notes (Signed)
Pt arrives with c/o of left sided facial numbness that started at 1000 he was eating breakfast when suddenly he noticed the left side of his face was numb- pt did not have any numbness in left arm or leg. Pt states this has gradually gotten better but does mildly have the numbness. Pt was taken to the charge bridge and evaluated by EDP to ensure no code stroke should be called. EDP does not want to activate a code stroke. Will procedure with standing order sets. Pt has no other neuro deficits at this time educated to please inform if this changes.

## 2019-08-25 NOTE — ED Provider Notes (Addendum)
MSE was initiated and I personally evaluated the patient and placed orders (if any) at  12:10 PM on August 25, 2019.  The patient appears stable so that the remainder of the MSE may be completed by another provider.  In brief, I was asked to evaluate patient after he reported left facial numbness with question of whether not patient should be a stroke alert.  Soon before arriving to emergency department he had an episode of having left arm tingling sensation as well as left tingling sensation on the left side of his face.  States that these symptoms have resolved and is no longer having any ongoing numbness.  Well appearing, on neuro he has no focal neuro deficits on my exam.  Alert oriented3, cranial nerves II through XII intact, normal finger-nose-finger, 5 out of 5 strength in bilateral upper and lower extremities, sensation to light touch intact across face, upper and lower extremities.  Does not meet stroke alert criteria at this time.     Will need stat EKG, ACS rule out work-up, consider TIA work-up. Placed initial orders will defer further orders to another provider when patient can be roomed.     Lucrezia Starch, MD 08/25/19 1215    Lucrezia Starch, MD 08/25/19 [phone]

## 2019-08-26 ENCOUNTER — Observation Stay (HOSPITAL_COMMUNITY): Payer: PPO

## 2019-08-26 ENCOUNTER — Other Ambulatory Visit: Payer: Self-pay

## 2019-08-26 ENCOUNTER — Other Ambulatory Visit: Payer: Self-pay | Admitting: Neurology

## 2019-08-26 ENCOUNTER — Encounter (HOSPITAL_COMMUNITY): Payer: Self-pay | Admitting: *Deleted

## 2019-08-26 DIAGNOSIS — R200 Anesthesia of skin: Secondary | ICD-10-CM

## 2019-08-26 DIAGNOSIS — I10 Essential (primary) hypertension: Secondary | ICD-10-CM | POA: Diagnosis not present

## 2019-08-26 DIAGNOSIS — G459 Transient cerebral ischemic attack, unspecified: Secondary | ICD-10-CM | POA: Diagnosis not present

## 2019-08-26 HISTORY — DX: Anesthesia of skin: R20.0

## 2019-08-26 LAB — BASIC METABOLIC PANEL
Anion gap: 10 (ref 5–15)
BUN: 12 mg/dL (ref 8–23)
CO2: 25 mmol/L (ref 22–32)
Calcium: 9 mg/dL (ref 8.9–10.3)
Chloride: 103 mmol/L (ref 98–111)
Creatinine, Ser: 0.8 mg/dL (ref 0.61–1.24)
GFR calc Af Amer: >60 mL/min (ref >60)
GFR calc non Af Amer: >60 mL/min (ref >60)
Glucose, Bld: 108 mg/dL — ABNORMAL HIGH (ref 70–99)
Potassium: 3.4 mmol/L — ABNORMAL LOW (ref 3.5–5.1)
Sodium: 138 mmol/L (ref 135–145)

## 2019-08-26 MED ORDER — CLOPIDOGREL BISULFATE 75 MG PO TABS
75.0000 mg | ORAL_TABLET | Freq: Every day | ORAL | Status: DC
  Administered 2019-08-26: 75 mg via ORAL
  Filled 2019-08-26: qty 1

## 2019-08-26 MED ORDER — CLOPIDOGREL BISULFATE 75 MG PO TABS: 75.0000 mg | ORAL_TABLET | Freq: Every day | ORAL | 0 refills | Status: AC

## 2019-08-26 MED ORDER — ATORVASTATIN CALCIUM 20 MG PO TABS: 20.0000 mg | ORAL_TABLET | Freq: Every day | ORAL | 0 refills | Status: AC

## 2019-08-26 MED ORDER — ASPIRIN 81 MG PO TABS: 81.0000 mg | ORAL_TABLET | Freq: Every evening | ORAL | 0 refills | Status: AC

## 2019-08-26 MED ORDER — ASPIRIN EC 81 MG PO TBEC: 81.0000 mg | DELAYED_RELEASE_TABLET | Freq: Every day | ORAL | Status: DC

## 2019-08-26 NOTE — Progress Notes (Signed)
Pt transported off unit to MRI. P. Amo Kolyn Rozario RN 

## 2019-08-26 NOTE — TOC Transition Note (Signed)
Transition of Care [facility]) - CM/SW Discharge Note   Patient Details  Name: Jonathan Lane MRN: [id number] Date of Birth: [birth year]  [facility] of [facility]) CM/SW Contact:  Pollie Friar, RN Phone Number: 08/26/2019, 3:41 PM   Clinical Narrative:    Pt discharging home with self care. Pt has PCP, insurance and transportation home.   Final next level of care: Home/Self Care Barriers to Discharge: No Barriers Identified   Patient Goals and CMS Choice        Discharge Placement                       Discharge Plan and Services                                     Social Determinants of Health (SDOH) Interventions     Readmission Risk Interventions No flowsheet data found.

## 2019-08-26 NOTE — Progress Notes (Signed)
Pt back to room from MRI. P. Amo Abbigael Detlefsen RN °

## 2019-08-26 NOTE — Evaluation (Signed)
Speech Language Pathology Evaluation Patient Details Name: Jonathan Lane MRN: [id number] DOB: September 19, 1941 Today's Date: 08/26/2019 Time: [id number] SLP Time Calculation (min) (ACUTE ONLY): 21 min  Problem List:  Patient Active Problem List   Diagnosis Date Noted  . TIA (transient ischemic attack) 08/25/2019  . Hypokalemia 08/18/2019  . Obstructive sleep apnea 01/22/2019  . Gastroesophageal reflux disease 05/22/2018  . Sore throat 05/22/2018  . RLQ abdominal pain 03/15/2018  . Chest pain 01/29/2018  . OSA on CPAP 10/11/2015  . 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 Snoring   . ATRIAL FIBRILLATION 08/31/2009  . CARDIAC MURMUR 08/31/2009  . Elevated lipids 06/02/2009  . Essential hypertension 06/02/2009  . DIZZINESS 06/02/2009  . PALPITATIONS 06/02/2009   Past Medical History:  Past Medical History:  Diagnosis Date  . Atrial fibrillation ([place])   . Bradycardia   . Bradycardia   . Cardiac murmur   . Dizziness   . Glaucoma   . Heart palpitations   . Hyperlipidemia   . Hypertension   . Skin abnormalities    pre cancerous lesion R hand  . Sleep apnea    uses cpap  . Snoring    Past Surgical History:  Past Surgical History:  Procedure Laterality Date  . APPENDECTOMY  1965  . COLONOSCOPY    . POLYPECTOMY    . TONSILLECTOMY     as a child   HPI:  Pt is a 78 y.o. male with medical history significant of hypertension, hyperlipidemia, paroxysmal A. Fib, obstructive sleep apnea on CPAP who presented to emergency department with complaints of left-sided facial and upper arm numbness. CT of the head and MRI of the brain were negative for acute changes.    Assessment / Plan / Recommendation Clinical Impression  Pt reported that he was living independently prior to admission. He denied any baseline deficits or acute changes in speech, language or cognition. His speech and language skills are currently within normal limits and no  overt cognitive deficits were noted. Further skilled SLP services are not clinically indicated at this time. Pt and nursing were educated regarding results and recommendations; both parties verbalized understanding as well as agreement with plan of care.    SLP Assessment  SLP Recommendation/Assessment: Patient does not need any further Speech Lanaguage Pathology Services SLP Visit Diagnosis: Dysarthria and anarthria (R47.1)    Follow Up Recommendations  None    Frequency and Duration           SLP Evaluation Cognition  Overall Cognitive Status: Within Functional Limits for tasks assessed Arousal/Alertness: Awake/alert Orientation Level: Oriented X4 Attention: Focused;Sustained Focused Attention: Appears intact Sustained Attention: Appears intact Memory: Appears intact(Immediate: 3/3; dealyed: 3/3) Awareness: Appears intact Problem Solving: Appears intact       Comprehension  Auditory Comprehension Overall Auditory Comprehension: Appears within functional limits for tasks assessed Yes/No Questions: Within Functional Limits Basic Biographical Questions: (5/5) Complex Questions: (5/5) Commands: Within Functional Limits Two Step Basic Commands: (3/3) Multistep Basic Commands: (3/3) Conversation: Complex Visual Recognition/Discrimination Discrimination: Within Function Limits Reading Comprehension Reading Status: Within funtional limits    Expression Expression Primary Mode of Expression: Verbal Verbal Expression Overall Verbal Expression: Appears within functional limits for tasks assessed Initiation: No impairment Automatic Speech: Day of week;Month of year(WNL) Level of Generative/Spontaneous Verbalization: Conversation Repetition: No impairment(5/5) Naming: No impairment Responsive: (5/5) Convergent: (10/10) Pragmatics: No impairment   Oral / Motor  Oral Motor/Sensory Function Overall Oral Motor/Sensory Function: Within functional limits Motor Speech  Overall  Motor Speech: Appears within functional limits for tasks assessed Respiration: Within functional limits Phonation: Normal Resonance: Within functional limits Articulation: Within functional limitis Intelligibility: Intelligible Motor Speech Errors: Not applicable   Almeda Ezra I. Hardin Negus, [place], [place] Office number [phone] Pager [place] 08/26/2019, 10:20 AM

## 2019-08-26 NOTE — Progress Notes (Signed)
Occupational Therapy Discharge Patient Details Name: Jonathan Lane MRN: [id number] DOB: 11/21/1941 Today's Date: 08/26/2019 Time:  -     Patient discharged from OT services secondary to at baseline.  Please see latest therapy progress note for current level of functioning and progress toward goals.    Progress and discharge plan discussed with patient and/or caregiver: Patient/Caregiver agrees with plan  GO     Richelle Ito, OTR/L  Acute Rehabilitation Services Pager: [phone] Office: [phone] .  08/26/2019, 10:23 AM

## 2019-08-26 NOTE — Progress Notes (Signed)
Pt discharge education and instructions completed with pt. Pt voices understanding and denies any questions. Pt IV and telemetry removed; pt discharge home with pt to transport himself home. Pt transported off unit via wheelchair with belongings to the side along with belongings. Pt to pick up electronically sent prescriptions from preferred pharmacy on file. Delia Heady RN

## 2019-08-26 NOTE — Progress Notes (Signed)
Chaplain visited patient as a referral from the nurse.  The patient admittedly is still grieving their deceased wife.  As a result was more cautious of their health.  The patient did show a good awareness of what they are feeling and what is happening.  Chaplain offered prayer and supportive conversation.  Chaplain will follow-up.  Brion Aliment Chaplain Resident For questions concerning this note please contact me by pager [phone]

## 2019-08-26 NOTE — Evaluation (Addendum)
Physical Therapy Evaluation Patient Details Name: Jonathan Lane MRN: [id number] DOB: Feb 11, 1941 Today's Date: 08/26/2019   History of Present Illness  Patient is a 78 y/o male who presents with transient left sided numbness. Head CT-unremarkable. MRI pending. Concern for TIA. PMH includes HTN, HLD, A-fib, obstructive sleep apnea on CPAP.  Clinical Impression  Patient independent PTA and lives alone as pt wife recently passed away. Pt walks 1.5 miles daily, drives and does IADLs. Pt reports all symptoms has resolved. Today, pt Mod I for transfers, ambulation and stairs and tolerated higher level balance challenges without LOB or difficulty. Able to walk backwards, change gait speed, perform head turns without difficulty. Education re: BeFAST and importance of TIME in getting to hospital when symptoms arise. Pt verbalizes and understands. All education completed. Pt does not require skilled therapy services as pt functioning at baseline. Encouraged walking while in the hospital. Discharge from therapy.    Follow Up Recommendations No PT follow up    Equipment Recommendations  None recommended by PT    Recommendations for Other Services       Precautions / Restrictions Precautions Precautions: None Restrictions Weight Bearing Restrictions: No      Mobility  Bed Mobility Overal bed mobility: Modified Independent             General bed mobility comments: HOB flat, no use of rails.  Transfers Overall transfer level: Independent               General transfer comment: Stood from EOB without difficulty.  Ambulation/Gait Ambulation/Gait assistance: Independent Gait Distance (Feet): 400 Feet Assistive device: None Gait Pattern/deviations: Step-through pattern;Decreased stride length Gait velocity: good speed Gait velocity interpretation: >2.62 ft/sec, indicative of community ambulatory General Gait Details: Steady gait without overt LOB.  Stairs Stairs:  Yes Stairs assistance: Modified independent (Device/Increase time) Stair Management: Two rails;Alternating pattern Number of Stairs: 5 General stair comments: Held on for support for safety per report.  Wheelchair Mobility    Modified Rankin (Stroke Patients Only) Modified Rankin (Stroke Patients Only) Pre-Morbid Rankin Score: Slight disability Modified Rankin: Slight disability     Balance Overall balance assessment: Needs assistance Sitting-balance support: Feet supported;No upper extremity supported Sitting balance-Leahy Scale: Normal     Standing balance support: During functional activity Standing balance-Leahy Scale: Good               High level balance activites: Backward walking;Direction changes;Turns;Sudden stops;Head turns High Level Balance Comments: Tolerated above without LOB or deviations in gait. Standardized Balance Assessment Standardized Balance Assessment : Dynamic Gait Index   Dynamic Gait Index Level Surface: Normal Change in Gait Speed: Normal Gait with Horizontal Head Turns: Normal Gait with Vertical Head Turns: Normal Gait and Pivot Turn: Mild Impairment Step Over Obstacle: Mild Impairment Step Around Obstacles: Normal Steps: Mild Impairment Total Score: 21       Pertinent Vitals/Pain Pain Assessment: No/denies pain    Home Living Family/patient expects to be discharged to:: Private residence Living Arrangements: Alone   Type of Home: House       Home Layout: Two level Home Equipment: Walker - 2 wheels Additional Comments: Has DME from wife.    Prior Function Level of Independence: Independent         Comments: Walks 1.5 miles /day. Recently lost wife ~1 week ago. Drives, does all IADLs.     Hand Dominance        Extremity/Trunk Assessment   Upper Extremity Assessment Upper Extremity Assessment: Defer to  OT evaluation    Lower Extremity Assessment Lower Extremity Assessment: Overall WFL for tasks assessed        Communication   Communication: No difficulties  Cognition Arousal/Alertness: Awake/alert Behavior During Therapy: WFL for tasks assessed/performed Overall Cognitive Status: Within Functional Limits for tasks assessed                                 General Comments: Remembered this therapist from seeing his wife a few months ago.      General Comments      Exercises     Assessment/Plan    PT Assessment Patent does not need any further PT services  PT Problem List         PT Treatment Interventions      PT Goals (Current goals can be found in the Care Plan section)  Acute Rehab PT Goals Patient Stated Goal: to get home PT Goal Formulation: All assessment and education complete, DC therapy    Frequency     Barriers to discharge        Co-evaluation               AM-PAC PT "6 Clicks" Mobility  Outcome Measure Help needed turning from your back to your side while in a flat bed without using bedrails?: None Help needed moving from lying on your back to sitting on the side of a flat bed without using bedrails?: None Help needed moving to and from a bed to a chair (including a wheelchair)?: None Help needed standing up from a chair using your arms (e.g., wheelchair or bedside chair)?: None Help needed to walk in hospital room?: None Help needed climbing 3-5 steps with a railing? : A Little 6 Click Score: 23    End of Session   Activity Tolerance: Patient tolerated treatment well Patient left: Other (comment)(standing at doorway with nurse) Nurse Communication: Mobility status PT Visit Diagnosis: Difficulty in walking, not elsewhere classified (R26.2)    Time: [id number] PT Time Calculation (min) (ACUTE ONLY): 15 min   Charges:   PT Evaluation $PT Eval Moderate Complexity: 1 Mod          Wray Kearns, PT, DPT Acute Rehabilitation Services Pager [phone] Office [phone]      Marguarite Arbour A Leeam Cedrone 08/26/2019, 8:16  AM

## 2019-08-26 NOTE — Progress Notes (Signed)
STROKE TEAM PROGRESS NOTE   INTERVAL HISTORY Pt sitting in bed, no complains. Neuro exam normal. He stated that he has no hx of afib or palpitation. However, in Dr. Kyla Balzarine note pt did complain of palpitation before but no afib or arrhythmia found.   Vitals:   08/25/19 1939 08/25/19 2204 08/25/19 2351 08/26/19 0424  BP: (!) 147/75 (!) 148/76 (!) 156/77 133/75  Pulse: 67 60 (!) 57 65  Resp: 18 18 18 18   Temp: 98 F (36.7 C) 98 F (36.7 C) 98.4 F (36.9 C) 98 F (36.7 C)  TempSrc: Oral Oral Oral Oral  SpO2: 98% 98% 95% 97%  Weight:      Height:        CBC:  Recent Labs  Lab 08/25/19 1212  WBC 8.4  NEUTROABS 5.5  HGB 14.3  HCT 44.6  MCV 97.6  PLT [id number]    Basic Metabolic Panel:  Recent Labs  Lab 08/25/19 1212 08/26/19 0337  NA 139 138  K 3.5 3.4*  CL 103 103  CO2 29 25  GLUCOSE 120* 108*  BUN 13 12  CREATININE 0.94 0.80  CALCIUM 9.2 9.0   Lipid Panel:     Component Value Date/Time   CHOL 123 08/18/2019 0550   TRIG 54 08/18/2019 0550   HDL 37 (L) 08/18/2019 0550   CHOLHDL 3.3 08/18/2019 0550   VLDL 11 08/18/2019 0550   LDLCALC 75 08/18/2019 0550   HgbA1c: No results found for: HGBA1C Urine Drug Screen: No results found for: LABOPIA, COCAINSCRNUR, LABBENZ, AMPHETMU, THCU, LABBARB  Alcohol Level No results found for: ETH  IMAGING Ct Angio Head W Or Wo Contrast  Result Date: 08/25/2019 CLINICAL DATA:  Left-sided facial numbness beginning at 10 o'clock a.m. today. Patient denies symptoms in the upper or lower extremity. Symptoms have improved since onset. EXAM: CT ANGIOGRAPHY HEAD AND NECK TECHNIQUE: Multidetector CT imaging of the head and neck was performed using the standard protocol during bolus administration of intravenous contrast. Multiplanar CT image reconstructions and MIPs were obtained to evaluate the vascular anatomy. Carotid stenosis measurements (when applicable) are obtained utilizing NASCET criteria, using the distal internal carotid diameter as  the denominator. CONTRAST:  74mL OMNIPAQUE IOHEXOL 350 MG/ML SOLN COMPARISON:  CT head without contrast 08/25/19 FINDINGS: CTA NECK FINDINGS Aortic arch: A 3 vessel arch configuration is present. Atherosclerotic changes are noted in the distal aortic arch. There is no aneurysm. Origins of the innominate and left common carotid artery are not imaged. Left subclavian artery origin is within normal limits. Right carotid system: The right common carotid artery is within normal limits. The bifurcation is unremarkable. Atherosclerotic calcifications are present in the proximal right ICA without significant stenosis. The more distal right ICA is normal. Left carotid system: The left common carotid artery is within normal limits. They were is slight atherosclerotic narrowing at the proximal left ICA without a significant stenosis relative to the more distal vessel. The cervical left ICA is otherwise normal. Vertebral arteries: The vertebral arteries are codominant. Both vertebral arteries originate from the subclavian arteries without significant stenosis. Skeleton: Vertebral body heights are maintained. Normal cervical lordosis is present. Slight anterolisthesis is present at C3-4. Other neck: The soft tissues the neck are otherwise unremarkable. Upper chest: Centrilobular emphysematous changes are present. Lung apices are otherwise clear. Thoracic inlet is normal Review of the MIP images confirms the above findings CTA HEAD FINDINGS Anterior circulation: Calcifications are present within the cavernous internal carotid arteries bilaterally. There is no significant stenosis  through the ICA termini. The A1 and M1 segments are normal. No definite anterior communicating artery is present. MCA bifurcations are intact bilaterally. ACA and MCA branch vessels are within normal limits. Posterior circulation: PICA origins are visualized and normal. There is a high-grade stenosis of the right vertebral artery at the level of the right  PICA. There is a high-grade stenosis in the distal left V4 segment just proximal to the vertebrobasilar junction. Basilar artery demonstrates some irregularity without a significant stenosis. Both posterior cerebral arteries originate from the basilar tip. The PCA branch vessels are within normal limits. Venous sinuses: The dural sinuses are patent. The straight sinus deep cerebral veins are intact. Cortical veins are within normal limits. No significant vascular malformation is evident. Anatomic variants: None Review of the MIP images confirms the above findings IMPRESSION: 1. 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 More distal vessel is reconstituted. 2. High-grade stenosis of the distal left V4 segment just proximal to the vertebrobasilar junction. 3. Mild irregularity of the basilar artery without a significant stenosis. 4. Atherosclerotic changes at the cavernous internal carotid arteries bilaterally without significant stenosis. 5. No significant anterior circulation stenosis or occlusion in the circle-of-Willis. 6. Atherosclerotic changes at the left carotid bifurcation without significant stenosis. 7. Mild degenerative changes in the cervical spine. Electronically Signed   By: San Morelle M.D.   On: 08/25/2019 21:14   Dg Chest 2 View  Result Date: 08/25/2019 CLINICAL DATA:  Pt complaining of left jaw/face numbness today - pt states he does not have any CP or SOB - hx of stroke, AFIB, htn, sleep apnea, bradycardia EXAM: CHEST - 2 VIEW COMPARISON:  Chest radiograph 08/17/2019 FINDINGS: Stable cardiomediastinal contours. There are aortic arch calcifications. The lungs are clear. No pneumothorax or pleural effusion. No acute finding in the visualized skeleton. IMPRESSION: No active cardiopulmonary disease. Electronically Signed   By: Audie Pinto M.D.   On: 08/25/2019 13:32   Ct Head Wo Contrast  Result Date: 08/25/2019 CLINICAL DATA:  Left face and arm numbness.  EXAM: CT HEAD WITHOUT CONTRAST TECHNIQUE: Contiguous axial images were obtained from the base of the skull through the vertex without intravenous contrast. COMPARISON:  None. FINDINGS: Brain: No evidence for acute hemorrhage, mass lesion, midline shift, hydrocephalus or large infarct. Scattered areas of low-density in the periventricular white matter. 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 Vascular: No hyperdense vessel or unexpected calcification. Skull: Normal. Negative for fracture or focal lesion. Sinuses/Orbits: Mild disease in the right maxillary sinus. Other: None. IMPRESSION: 1. No acute intracranial abnormality. 2. Scattered areas of low-density in the periventricular white matter and left basal ganglia. 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 Electronically Signed   By: Markus Daft M.D.   On: 08/25/2019 14:15   Ct Angio Neck W Or Wo Contrast  Result Date: 08/25/2019 CLINICAL DATA:  Left-sided facial numbness beginning at 10 o'clock a.m. today. Patient denies symptoms in the upper or lower extremity. Symptoms have improved since onset. EXAM: CT ANGIOGRAPHY HEAD AND NECK TECHNIQUE: Multidetector CT imaging of the head and neck was performed using the standard protocol during bolus administration of intravenous contrast. Multiplanar CT image reconstructions and MIPs were obtained to evaluate the vascular anatomy. Carotid stenosis measurements (when applicable) are obtained utilizing NASCET criteria, using the distal internal carotid diameter as the denominator. CONTRAST:  35mL OMNIPAQUE IOHEXOL 350 MG/ML SOLN COMPARISON:  CT head without  contrast 08/25/19 FINDINGS: CTA NECK FINDINGS Aortic arch: A 3 vessel arch configuration is present. Atherosclerotic changes are noted in the distal aortic arch. There is no aneurysm. Origins of the innominate and left common carotid artery are not imaged. Left subclavian artery  origin is within normal limits. Right carotid system: The right common carotid artery is within normal limits. The bifurcation is unremarkable. Atherosclerotic calcifications are present in the proximal right ICA without significant stenosis. The more distal right ICA is normal. Left carotid system: The left common carotid artery is within normal limits. They were is slight atherosclerotic narrowing at the proximal left ICA without a significant stenosis relative to the more distal vessel. The cervical left ICA is otherwise normal. Vertebral arteries: The vertebral arteries are codominant. Both vertebral arteries originate from the subclavian arteries without significant stenosis. Skeleton: Vertebral body heights are maintained. Normal cervical lordosis is present. Slight anterolisthesis is present at C3-4. Other neck: The soft tissues the neck are otherwise unremarkable. Upper chest: Centrilobular emphysematous changes are present. Lung apices are otherwise clear. Thoracic inlet is normal Review of the MIP images confirms the above findings CTA HEAD FINDINGS Anterior circulation: Calcifications are present within the cavernous internal carotid arteries bilaterally. There is no significant stenosis through the ICA termini. The A1 and M1 segments are normal. No definite anterior communicating artery is present. MCA bifurcations are intact bilaterally. ACA and MCA branch vessels are within normal limits. Posterior circulation: PICA origins are visualized and normal. There is a high-grade stenosis of the right vertebral artery at the level of the right PICA. There is a high-grade stenosis in the distal left V4 segment just proximal to the vertebrobasilar junction. Basilar artery demonstrates some irregularity without a significant stenosis. Both posterior cerebral arteries originate from the basilar tip. The PCA branch vessels are within normal limits. Venous sinuses: The dural sinuses are patent. The straight sinus  deep cerebral veins are intact. Cortical veins are within normal limits. No significant vascular malformation is evident. Anatomic variants: None Review of the MIP images confirms the above findings IMPRESSION: 1. 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 More distal vessel is reconstituted. 2. High-grade stenosis of the distal left V4 segment just proximal to the vertebrobasilar junction. 3. Mild irregularity of the basilar artery without a significant stenosis. 4. Atherosclerotic changes at the cavernous internal carotid arteries bilaterally without significant stenosis. 5. No significant anterior circulation stenosis or occlusion in the circle-of-Willis. 6. Atherosclerotic changes at the left carotid bifurcation without significant stenosis. 7. Mild degenerative changes in the cervical spine. Electronically Signed   By: San Morelle M.D.   On: 08/25/2019 21:14   Mr Brain Wo Contrast  Result Date: 08/26/2019 CLINICAL DATA:  78 year old male with left face and arm numbness found to have high-grade distal vertebral artery stenosis on CTA yesterday. EXAM: MRI HEAD WITHOUT CONTRAST TECHNIQUE: Multiplanar, multiecho pulse sequences of the brain and surrounding structures were obtained without intravenous contrast. COMPARISON:  CT and CTA head yesterday. FINDINGS: Brain: No restricted diffusion to suggest acute infarction. No midline shift, mass effect, evidence of mass lesion, ventriculomegaly, extra-axial collection or acute intracranial hemorrhage. Cervicomedullary junction and pituitary are within normal limits. Scattered and occasionally patchy bilateral cerebral white matter T2 and FLAIR hyperintensity with no cortical encephalomalacia identified. No definite chronic blood products. T2 heterogeneity in the deep gray nuclei appears to be perivascular spaces. Negative brainstem and cerebellum. Vascular: Major intracranial vascular flow voids are preserved. Skull and upper  cervical spine:  Negative visible cervical spine. Normal bone marrow signal. Sinuses/Orbits: Negative orbits. Trace paranasal sinus mucosal thickening. Other: Mastoids are clear. Visible internal auditory structures appear normal. Scalp and face soft tissues appear negative. IMPRESSION: 1.  No acute intracranial abnormality. 2. Mild for age signal changes in the brain most commonly due to chronic small vessel disease. Electronically Signed   By: Genevie Ann M.D.   On: 08/26/2019 09:38    PHYSICAL EXAM  Temp:  [98 F (36.7 C)-98.4 F (36.9 C)] 98.2 F (36.8 C) (09/08 1300) Pulse Rate:  [57-98] 98 (09/08 1300) Resp:  [17-18] 17 (09/08 1300) BP: (133-169)/(70-80) 169/80 (09/08 1300) SpO2:  [95 %-98 %] 98 % (09/08 1300)  General - Well nourished, well developed, in no apparent distress.  Ophthalmologic - fundi not visualized due to noncooperation.  Cardiovascular - Regular rate and rhythm.  Mental Status -  Level of arousal and orientation to time, place, and person were intact. Language including expression, naming, repetition, comprehension was assessed and found intact. Fund of Knowledge was assessed and was intact.  Cranial Nerves II - XII - II - Visual field intact OU. III, IV, VI - Extraocular movements intact. V - Facial sensation intact bilaterally. VII - Facial movement intact bilaterally. VIII - Hearing & vestibular intact bilaterally. X - Palate elevates symmetrically. XI - Chin turning & shoulder shrug intact bilaterally. XII - Tongue protrusion intact.  Motor Strength - The patient's strength was normal in all extremities and pronator drift was absent.  Bulk was normal and fasciculations were absent.   Motor Tone - Muscle tone was assessed at the neck and appendages and was normal.  Reflexes - The patient's reflexes were symmetrical in all extremities and he had no pathological reflexes.  Sensory - Light touch, temperature/pinprick were assessed and were symmetrical.     Coordination - The patient had normal movements in the hands with no ataxia or dysmetria.  Tremor was absent.  Gait and Station - deferred.   ASSESSMENT/PLAN Mr. GERRICK BOK is a 78 y.o. male with history of palpitations,  hypertension, hyperlipidemia, sleep apnea presenting with left-sided numbness.   Right brain TIA  CT head No acute abnormality. 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    CTA head & neck high-grade stenosis RVA at PICA origin, and distal L V4 proximal to VBJ.  Mild irregularity BA.  Atherosclerosis B cavernous ICAs, L ICA origin.  Mild degenerative changes cervical spine.  MRI no acute abnormality.  Small vessel disease.  2D Echo EF >65%. No source of embolus   LDL 75  HgbA1c pending  Lovenox 40 mg sq daily for VTE prophylaxis  aspirin 81 mg daily prior to admission, now on aspirin 325 mg daily.  Recommend aspirin 81 and Plavix 75 for 3 weeks and then Plavix alone.  Therapy recommendations:  No therapy needs  Disposition:  Return home  Palpitations (no documented AF)  Per Dr. Kyla Balzarine note April 2017 no documented hx of AF, only palpitations. Reconfirmed with last visit in April 2020.  Controlled with cardizem  Home anticoagulation:  none  . Patient has appointment with Dr. Kathee Delton in 2 days, recommend to set up a 30 day Cardio event monitoring    Hypertension  Stable . BP goal normotensive  Hyperlipidemia  Home meds: Lipitor 10 and fish oil  Now on Lipitor 20  LDL 75, goal < 70  Continue statin at discharge  Other Stroke Risk Factors  Advanced  age  Family hx stroke (father, mother, sister)  Obstructive sleep apnea, on CPAP at home  Other Active Problems  GERD  Hospital day # 0  Neurology will sign off. Please call with questions. Pt will follow up with stroke clinic NP at [facility] in about 4 weeks. Thanks for the consult.  Rosalin Hawking, MD PhD Stroke  Neurology 08/26/2019 6:49 PM    To contact Stroke Continuity provider, please refer to [email or web address] After hours, contact General Neurology

## 2019-08-26 NOTE — Discharge Summary (Signed)
Physician Discharge Summary  ERI SCHU [id number] DOB: [birth year] DOA: 08/25/2019  PCP: Glenis Smoker, MD  Admit date: 08/25/2019 Discharge date: 08/26/2019  Time spent: 35 minutes  Recommendations for Outpatient Follow-up:  Cardiology Dr. Johnsie Cancel on Thursday, please set him up for 30-day monitor Neurology Dr.Xu. in 1 month  Discharge Diagnoses:  Right brain TIA Intracranial atherosclerosis   Essential hypertension   OSA on CPAP   Gastroesophageal reflux disease   Obstructive sleep apnea   TIA (transient ischemic attack)   Discharge Condition: Heart healthy  Diet recommendation: Stable  Filed Weights   08/25/19 1555  Weight: 83.3 kg    History of present illness:  Chief Complaint: Left sided facial numbness and left upper arm numbness started this morning.  HPI: Jonathan Lane is a 78 y.o. male with medical history significant of hypertension, hyperlipidemia, paroxysmal A. 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 Johnsie Cancel from cardiology has an upcoming appointment later this week, neurology recommends 30-day monitor -Back to baseline, discharged home in a stable condition to follow-up with [facility] neurology in 4 weeks  History of palpitations  -on Cardizem -Followed by cardiology, no documented atrial fibrillation -30-day monitor at discharge  Discharge Exam: Vitals:   08/26/19 0800 08/26/19 1300  BP: 133/70 (!) 169/80  Pulse: 64 98  Resp: 17 17  Temp: 98.2 F (36.8 C) 98.2 F (36.8 C)  SpO2: 98% 98%    General: AAOx3 Cardiovascular: S1S2/RRR Respiratory: CTAB  Discharge Instructions   Discharge Instructions    Diet - low sodium heart healthy   Complete by: As directed    Increase activity slowly   Complete by: As directed      Allergies as of 08/26/2019      Reactions   Brimonidine Tartrate-timolol Itching   Itching eyes Itching eyes, rash      Medication List    TAKE these medications   ALIGN PO Take 1 tablet by mouth daily. Take daily   amoxicillin 500 MG capsule Commonly known as: AMOXIL Take 500 mg by mouth 2 (two) times daily.   aspirin 81 MG tablet Take 1 tablet (81 mg total) by mouth every evening for 21 days.   atorvastatin 20 MG tablet Commonly known as: LIPITOR Take 1 tablet (20 mg total) by mouth daily. What changed:   medication strength  how much to take   clopidogrel 75 MG tablet Commonly known as: PLAVIX Take 1 tablet (75 mg total) by mouth daily.   CO Q 10 PO Take 1 capsule by mouth daily.  Cosopt PF 22.3-6.8 MG/ML Soln ophthalmic solution Generic drug: dorzolamidel-timolol Place 1 drop into both eyes 2 (two) times daily.   Cranberry 400 MG Caps Take 1 capsule by mouth daily.   diltiazem 240 MG 24 hr capsule Commonly known as: DILACOR XR Take 1 capsule (240 mg total) by mouth daily.   docusate sodium 100 MG capsule Commonly known as: COLACE Take 200 mg by mouth daily.   esomeprazole 40 MG capsule Commonly  known as: NEXIUM TAKE 1 CAPSULE TWICE DAILY BEFORE MEALS What changed: See the new instructions.   fish oil-omega-3 fatty acids 1000 MG capsule Take 1 g by mouth 2 (two) times daily.   latanoprost 0.005 % ophthalmic solution Commonly known as: XALATAN Place 1 drop into both eyes at bedtime.   losartan-hydrochlorothiazide 100-25 MG tablet Commonly known as: HYZAAR Take 1 tablet by mouth daily.   MOVE FREE PO Take 1 tablet by mouth daily.   multivitamin per tablet Take 1 tablet by mouth daily.   nitroGLYCERIN 0.4 MG SL tablet Commonly known as: NITROSTAT Place 1 tablet (0.4 mg total) under the tongue every 5 (five) minutes as needed for chest pain.   oxymetazoline 0.05 % nasal spray Commonly known as: AFRIN Place 1 spray into both nostrils daily as needed for congestion.   PREVAGEN PO Take 1 capsule by mouth daily. [employer] and memory.   Prostate Health Caps Take 1 capsule by mouth daily.   Rhopressa 0.02 % Soln Generic drug: Netarsudil Dimesylate Place 1 drop into both eyes at bedtime.   valACYclovir 1000 MG tablet Commonly known as: VALTREX Take 1,000 mg by mouth daily as needed (outbreak).      Allergies  Allergen Reactions  . Brimonidine Tartrate-Timolol Itching    Itching eyes Itching eyes, rash   Follow-up Information    Josue Hector, MD Follow up.   Specialty: Cardiology Why: 30day monitor Contact information: [id number] [address] [phone]        Rosalin Hawking, MD. Go in 1 month(s).   Specialty: Neurology Why: Office will call you Contact information: [address] [place] [address] [phone]            The results of significant diagnostics from this hospitalization (including imaging, microbiology, ancillary and laboratory) are listed below for reference.    Significant Diagnostic Studies: Ct Angio Head W Or Wo Contrast  Result Date: 08/25/2019 CLINICAL DATA:  Left-sided  facial numbness beginning at 10 o'clock a.m. today. Patient denies symptoms in the upper or lower extremity. Symptoms have improved since onset. EXAM: CT ANGIOGRAPHY HEAD AND NECK TECHNIQUE: Multidetector CT imaging of the head and neck was performed using the standard protocol during bolus administration of intravenous contrast. Multiplanar CT image reconstructions and MIPs were obtained to evaluate the vascular anatomy. Carotid stenosis measurements (when applicable) are obtained utilizing NASCET criteria, using the distal internal carotid diameter as the denominator. CONTRAST:  41mL OMNIPAQUE IOHEXOL 350 MG/ML SOLN COMPARISON:  CT head without contrast 08/25/19 FINDINGS: CTA NECK FINDINGS Aortic arch: A 3 vessel arch configuration is present. Atherosclerotic changes are noted in the distal aortic arch. There is no aneurysm. Origins of the innominate and left common carotid artery are not imaged. Left subclavian artery origin is within normal limits. Right carotid system: The right common carotid artery is within normal limits. The bifurcation is unremarkable. Atherosclerotic calcifications are present in the proximal right ICA without significant stenosis. The more distal right ICA is  normal. Left carotid system: The left common carotid artery is within normal limits. They were is slight atherosclerotic narrowing at the proximal left ICA without a significant stenosis relative to the more distal vessel. The cervical left ICA is otherwise normal. Vertebral arteries: The vertebral arteries are codominant. Both vertebral arteries originate from the subclavian arteries without significant stenosis. Skeleton: Vertebral body heights are maintained. Normal cervical lordosis is present. Slight anterolisthesis is present at C3-4. Other neck: The soft tissues the neck are otherwise unremarkable. Upper chest: Centrilobular emphysematous changes are present. Lung apices are otherwise clear. Thoracic inlet is normal Review of  the MIP images confirms the above findings CTA HEAD FINDINGS Anterior circulation: Calcifications are present within the cavernous internal carotid arteries bilaterally. There is no significant stenosis through the ICA termini. The A1 and M1 segments are normal. No definite anterior communicating artery is present. MCA bifurcations are intact bilaterally. ACA and MCA branch vessels are within normal limits. Posterior circulation: PICA origins are visualized and normal. There is a high-grade stenosis of the right vertebral artery at the level of the right PICA. There is a high-grade stenosis in the distal left V4 segment just proximal to the vertebrobasilar junction. Basilar artery demonstrates some irregularity without a significant stenosis. Both posterior cerebral arteries originate from the basilar tip. The PCA branch vessels are within normal limits. Venous sinuses: The dural sinuses are patent. The straight sinus deep cerebral veins are intact. Cortical veins are within normal limits. No significant vascular malformation is evident. Anatomic variants: None Review of the MIP images confirms the above findings IMPRESSION: 1. 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 More distal vessel is reconstituted. 2. High-grade stenosis of the distal left V4 segment just proximal to the vertebrobasilar junction. 3. Mild irregularity of the basilar artery without a significant stenosis. 4. Atherosclerotic changes at the cavernous internal carotid arteries bilaterally without significant stenosis. 5. No significant anterior circulation stenosis or occlusion in the circle-of-Willis. 6. Atherosclerotic changes at the left carotid bifurcation without significant stenosis. 7. Mild degenerative changes in the cervical spine. Electronically Signed   By: San Morelle M.D.   On: 08/25/2019 21:14   Dg Chest 2 View  Result Date: 08/25/2019 CLINICAL DATA:  Pt complaining of left jaw/face numbness  today - pt states he does not have any CP or SOB - hx of stroke, AFIB, htn, sleep apnea, bradycardia EXAM: CHEST - 2 VIEW COMPARISON:  Chest radiograph 08/17/2019 FINDINGS: Stable cardiomediastinal contours. There are aortic arch calcifications. The lungs are clear. No pneumothorax or pleural effusion. No acute finding in the visualized skeleton. IMPRESSION: No active cardiopulmonary disease. Electronically Signed   By: Audie Pinto M.D.   On: 08/25/2019 13:32   Dg Chest 2 View  Result Date: 08/17/2019 CLINICAL DATA:  Chest pain EXAM: CHEST - 2 VIEW COMPARISON:  02/26/2018 FINDINGS: The cardiomediastinal contours are unchanged. The lungs are clear. Pulmonary vasculature is normal. No consolidation, pleural effusion, or pneumothorax. No acute osseous abnormalities are seen. IMPRESSION: No acute chest findings. Electronically Signed   By: Keith Rake M.D.   On: 08/17/2019 22:33   Ct Head Wo Contrast  Result Date: 08/25/2019 CLINICAL DATA:  Left face and arm numbness. EXAM: CT HEAD WITHOUT CONTRAST TECHNIQUE: Contiguous axial images were obtained from the base of the skull through the vertex without intravenous contrast. COMPARISON:  None. FINDINGS: Brain: No evidence for acute hemorrhage, mass lesion, midline shift, hydrocephalus or large infarct. Scattered areas of low-density in the  periventricular white matter. 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 Vascular: No hyperdense vessel or unexpected calcification. Skull: Normal. Negative for fracture or focal lesion. Sinuses/Orbits: Mild disease in the right maxillary sinus. Other: None. IMPRESSION: 1. No acute intracranial abnormality. 2. Scattered areas of low-density in the periventricular white matter and left basal ganglia. 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 Electronically Signed   By: Markus Daft M.D.   On: 08/25/2019 14:15   Ct Angio Neck W Or Wo  Contrast  Result Date: 08/25/2019 CLINICAL DATA:  Left-sided facial numbness beginning at 10 o'clock a.m. today. Patient denies symptoms in the upper or lower extremity. Symptoms have improved since onset. EXAM: CT ANGIOGRAPHY HEAD AND NECK TECHNIQUE: Multidetector CT imaging of the head and neck was performed using the standard protocol during bolus administration of intravenous contrast. Multiplanar CT image reconstructions and MIPs were obtained to evaluate the vascular anatomy. Carotid stenosis measurements (when applicable) are obtained utilizing NASCET criteria, using the distal internal carotid diameter as the denominator. CONTRAST:  57mL OMNIPAQUE IOHEXOL 350 MG/ML SOLN COMPARISON:  CT head without contrast 08/25/19 FINDINGS: CTA NECK FINDINGS Aortic arch: A 3 vessel arch configuration is present. Atherosclerotic changes are noted in the distal aortic arch. There is no aneurysm. Origins of the innominate and left common carotid artery are not imaged. Left subclavian artery origin is within normal limits. Right carotid system: The right common carotid artery is within normal limits. The bifurcation is unremarkable. Atherosclerotic calcifications are present in the proximal right ICA without significant stenosis. The more distal right ICA is normal. Left carotid system: The left common carotid artery is within normal limits. They were is slight atherosclerotic narrowing at the proximal left ICA without a significant stenosis relative to the more distal vessel. The cervical left ICA is otherwise normal. Vertebral arteries: The vertebral arteries are codominant. Both vertebral arteries originate from the subclavian arteries without significant stenosis. Skeleton: Vertebral body heights are maintained. Normal cervical lordosis is present. Slight anterolisthesis is present at C3-4. Other neck: The soft tissues the neck are otherwise unremarkable. Upper chest: Centrilobular emphysematous changes are present. Lung  apices are otherwise clear. Thoracic inlet is normal Review of the MIP images confirms the above findings CTA HEAD FINDINGS Anterior circulation: Calcifications are present within the cavernous internal carotid arteries bilaterally. There is no significant stenosis through the ICA termini. The A1 and M1 segments are normal. No definite anterior communicating artery is present. MCA bifurcations are intact bilaterally. ACA and MCA branch vessels are within normal limits. Posterior circulation: PICA origins are visualized and normal. There is a high-grade stenosis of the right vertebral artery at the level of the right PICA. There is a high-grade stenosis in the distal left V4 segment just proximal to the vertebrobasilar junction. Basilar artery demonstrates some irregularity without a significant stenosis. Both posterior cerebral arteries originate from the basilar tip. The PCA branch vessels are within normal limits. Venous sinuses: The dural sinuses are patent. The straight sinus deep cerebral veins are intact. Cortical veins are within normal limits. No significant vascular malformation is evident. Anatomic variants: None Review of the MIP images confirms the above findings IMPRESSION: 1. 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 More distal vessel is reconstituted. 2. High-grade stenosis of the distal left V4 segment just proximal to the vertebrobasilar junction. 3. Mild irregularity of the basilar artery without a significant stenosis. 4. Atherosclerotic changes  at the cavernous internal carotid arteries bilaterally without significant stenosis. 5. No significant anterior circulation stenosis or occlusion in the circle-of-Willis. 6. Atherosclerotic changes at the left carotid bifurcation without significant stenosis. 7. Mild degenerative changes in the cervical spine. Electronically Signed   By: San Morelle M.D.   On: 08/25/2019 21:14   Mr Brain Wo Contrast  Result Date:  08/26/2019 CLINICAL DATA:  78 year old male with left face and arm numbness found to have high-grade distal vertebral artery stenosis on CTA yesterday. EXAM: MRI HEAD WITHOUT CONTRAST TECHNIQUE: Multiplanar, multiecho pulse sequences of the brain and surrounding structures were obtained without intravenous contrast. COMPARISON:  CT and CTA head yesterday. FINDINGS: Brain: No restricted diffusion to suggest acute infarction. No midline shift, mass effect, evidence of mass lesion, ventriculomegaly, extra-axial collection or acute intracranial hemorrhage. Cervicomedullary junction and pituitary are within normal limits. Scattered and occasionally patchy bilateral cerebral white matter T2 and FLAIR hyperintensity with no cortical encephalomalacia identified. No definite chronic blood products. T2 heterogeneity in the deep gray nuclei appears to be perivascular spaces. Negative brainstem and cerebellum. Vascular: Major intracranial vascular flow voids are preserved. Skull and upper cervical spine: Negative visible cervical spine. Normal bone marrow signal. Sinuses/Orbits: Negative orbits. Trace paranasal sinus mucosal thickening. Other: Mastoids are clear. Visible internal auditory structures appear normal. Scalp and face soft tissues appear negative. IMPRESSION: 1.  No acute intracranial abnormality. 2. Mild for age signal changes in the brain most commonly due to chronic small vessel disease. Electronically Signed   By: Genevie Ann M.D.   On: 08/26/2019 09:38   Ct Angio Chest/abd/pel For Dissection W And/or Wo Contrast  Result Date: 08/18/2019 CLINICAL DATA:  Initial evaluation for acute chest pain. EXAM: CT ANGIOGRAPHY CHEST, ABDOMEN AND PELVIS TECHNIQUE: Multidetector CT imaging through the chest, abdomen and pelvis was performed using the standard protocol during bolus administration of intravenous contrast. Multiplanar reconstructed images and MIPs were obtained and reviewed to evaluate the vascular anatomy.  CONTRAST:  123mL OMNIPAQUE IOHEXOL 350 MG/ML SOLN COMPARISON:  Prior radiograph from earlier same day. FINDINGS: CTA CHEST FINDINGS Cardiovascular: Precontrast imaging through the intrathoracic aorta demonstrates no mural thrombus or other acute abnormality. Mild to moderate scattered atheromatous plaque within the aortic arch. Postcontrast imaging demonstrates no evidence for dissection or other acute aortic pathology. Partially visualized great vessels intact. Heart size normal. Scattered 3 vessel coronary artery calcifications, most notable in the LAD. No pericardial effusion. Limited assessment of the pulmonary arterial tree grossly unremarkable. Mediastinum/Nodes: Thyroid within normal limits. No pathologically enlarged mediastinal, hilar, or axillary lymph nodes. Esophagus within normal limits. Lungs/Pleura: Tracheobronchial tree intact and patent. Lungs well inflated. No focal infiltrates. No pulmonary edema or pleural effusion. No pneumothorax. No worrisome pulmonary nodule or mass. Musculoskeletal: External soft tissues demonstrate no acute finding. No acute osseous abnormality. No discrete lytic or blastic osseous lesions. Review of the MIP images confirms the above findings. CTA ABDOMEN AND PELVIS FINDINGS VASCULAR Aorta: Normal intravascular enhancement seen throughout the intra-abdominal aorta without evidence for dissection or other acute abnormality. Moderate aorto bi-iliac atherosclerotic disease. No aneurysm. Celiac: Celiac axis and its branch vessels are widely patent and well opacified. SMA: SMA widely patent without acute abnormality. Renals: Single right renal artery, with 2 left-sided renal arteries, all of which are widely patent without abnormality. IMA: IMA widely patent to its distal aspect. Inflow: Moderate atherosclerotic change present throughout the common and proximal external and internal iliac arteries bilaterally. Mild aneurysmal dilatation of the right common iliac artery up  to 18  mm. Superimposed penetrating atheromatous ulcer at the proximal right common iliac (series 7, image 244). Iliac arteries otherwise widely patent without acute abnormality. Veins: No appreciable venous abnormality allowing for timing of the contrast bolus. Review of the MIP images confirms the above findings. NON-VASCULAR Hepatobiliary: Liver demonstrates a normal contrast enhanced appearance. Cholelithiasis without evidence for acute cholecystitis. No biliary dilatation. Pancreas: Pancreas within normal limits. Spleen: Spleen within normal limits. Adrenals/Urinary Tract: Adrenal glands are normal. Kidneys equal in size with symmetric enhancement. 9 mm nonobstructive stone present within the lower pole the left kidney. No other radiopaque calculi. No hydronephrosis or focal enhancing renal mass. No hydroureter. Partially distended bladder within normal limits. Stomach/Bowel: Stomach within normal limits. No evidence for bowel obstruction. Appendix not visualize, consistent with history of prior appendectomy. Sigmoid diverticulosis without evidence for acute diverticulitis. No acute inflammatory changes seen about the bowels. Lymphatic: No pathologically enlarged intra-abdominopelvic lymph nodes. Reproductive: Prostate mildly enlarged measuring 5.3 cm in transverse diameter. Other: No free air or fluid. Musculoskeletal: No acute osseous abnormality. No discrete lytic or blastic osseous lesions. Multilevel facet arthropathy noted within the lower lumbar spine. Review of the MIP images confirms the above findings. IMPRESSION: 1. No CT evidence for acute aortic dissection or other acute aortic pathology. 2. 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 3. Moderate aorto bi-iliac atherosclerotic disease, with scattered 3 vessel coronary artery calcifications. 4. Cholelithiasis. 5. Sigmoid diverticulosis without evidence for acute  diverticulitis. 6. Nonobstructive left renal nephrolithiasis. Electronically Signed   By: Jeannine Boga M.D.   On: 08/18/2019 03:29    Microbiology: Recent Results (from the past 240 hour(s))  SARS Coronavirus 2 [facility] order, Performed in [facility] hospital lab) Nasopharyngeal Nasopharyngeal Swab     Status: None   Collection Time: 08/18/19  3:24 AM   Specimen: Nasopharyngeal Swab  Result Value Ref Range Status   SARS Coronavirus 2 NEGATIVE NEGATIVE Final    Comment: (NOTE) If result is NEGATIVE SARS-CoV-2 target nucleic acids are NOT DETECTED. The SARS-CoV-2 RNA is generally detectable in upper and lower  respiratory specimens during the acute phase of infection. The lowest  concentration of SARS-CoV-2 viral copies this assay can detect is 250  copies / mL. A negative result does not preclude SARS-CoV-2 infection  and should not be used as the sole basis for treatment or other  patient management decisions.  A negative result may occur with  improper specimen collection / handling, submission of specimen other  than nasopharyngeal swab, presence of viral mutation(s) within the  areas targeted by this assay, and inadequate number of viral copies  (<250 copies / mL). A negative result must be combined with clinical  observations, patient history, and epidemiological information. If result is POSITIVE SARS-CoV-2 target nucleic acids are DETECTED. The SARS-CoV-2 RNA is generally detectable in upper and lower  respiratory specimens dur ing the acute phase of infection.  Positive  results are indicative of active infection with SARS-CoV-2.  Clinical  correlation with patient history and other diagnostic information is  necessary to determine patient infection status.  Positive results do  not rule out bacterial infection or co-infection with other viruses. If result is PRESUMPTIVE POSTIVE SARS-CoV-2 nucleic acids MAY BE PRESENT.   A presumptive positive result was obtained  on the submitted specimen  and confirmed on repeat testing.  While 2019 novel coronavirus  (SARS-CoV-2) nucleic acids may be present in the submitted  sample  additional confirmatory testing may be necessary for epidemiological  and / or clinical management purposes  to differentiate between  SARS-CoV-2 and other Sarbecovirus currently known to infect humans.  If clinically indicated additional testing with an alternate test  methodology [id number]) is advised. The SARS-CoV-2 RNA is generally  detectable in upper and lower respiratory sp ecimens during the acute  phase of infection. The expected result is Negative. Fact Sheet for Patients:  StrictlyIdeas.no Fact Sheet for Healthcare Providers: BankingDealers.co.za This test is not yet approved or cleared by the [place] FDA and has been authorized for detection and/or diagnosis of SARS-CoV-2 by FDA under an Emergency Use Authorization (EUA).  This EUA will remain in effect (meaning this test can be used) for the duration of the COVID-19 declaration under Section 564(b)(1) of the Act, 21 U.S.C. section 360bbb-3(b)(1), unless the authorization is terminated or revoked sooner. Performed at [facility], [place] [address], [place], [place] [address]   MRSA PCR Screening     Status: None   Collection Time: 08/18/19  9:15 AM   Specimen: Nasal Mucosa; Nasopharyngeal  Result Value Ref Range Status   MRSA by PCR NEGATIVE NEGATIVE Final    Comment:        The GeneXpert MRSA Assay (FDA approved for NASAL specimens only), is one component of a comprehensive MRSA colonization surveillance program. It is not intended to diagnose MRSA infection nor to guide or monitor treatment for MRSA infections. Performed at [facility], [place] [address], [place], [place] [address]      Labs: Basic Metabolic Panel: Recent Labs  Lab 08/25/19 1212 08/26/19 0337  NA 139 138  K 3.5 3.4*  CL  103 103  CO2 29 25  GLUCOSE 120* 108*  BUN 13 12  CREATININE 0.94 0.80  CALCIUM 9.2 9.0   Liver Function Tests: No results for input(s): AST, ALT, ALKPHOS, BILITOT, PROT, ALBUMIN in the last 168 hours. No results for input(s): LIPASE, AMYLASE in the last 168 hours. No results for input(s): AMMONIA in the last 168 hours. CBC: Recent Labs  Lab 08/25/19 1212  WBC 8.4  NEUTROABS 5.5  HGB 14.3  HCT 44.6  MCV 97.6  PLT 224   Cardiac Enzymes: No results for input(s): CKTOTAL, CKMB, CKMBINDEX, TROPONINI in the last 168 hours. BNP: BNP (last 3 results) No results for input(s): BNP in the last 8760 hours.  ProBNP (last 3 results) No results for input(s): PROBNP in the last 8760 hours.  CBG: No results for input(s): GLUCAP in the last 168 hours.     Signed:  Domenic Polite MD.  [facility] 08/26/2019, 2:24 PM

## 2019-08-26 NOTE — Care Management Obs Status (Signed)
[facility] NOTIFICATION   Patient Details  Name: DOULGAS BUDNICK MRN: [id number] Date of Birth: [birth year]   Medicare Observation Status Notification Given:  Yes    Pollie Friar, RN 08/26/2019, 3:28 PM

## 2019-08-27 LAB — HEMOGLOBIN A1C
Hgb A1c MFr Bld: 5.6 % (ref 4.8–5.6)
Mean Plasma Glucose: 114 mg/dL

## 2019-08-28 ENCOUNTER — Ambulatory Visit: Payer: PPO | Admitting: Cardiology

## 2019-08-28 ENCOUNTER — Other Ambulatory Visit: Payer: Self-pay

## 2019-08-28 ENCOUNTER — Encounter: Payer: Self-pay | Admitting: Cardiology

## 2019-08-28 VITALS — BP 148/72 | HR 61 | Ht 74.0 in | Wt 189.0 lb

## 2019-08-28 DIAGNOSIS — I10 Essential (primary) hypertension: Secondary | ICD-10-CM | POA: Diagnosis not present

## 2019-08-28 DIAGNOSIS — G459 Transient cerebral ischemic attack, unspecified: Secondary | ICD-10-CM | POA: Diagnosis not present

## 2019-08-28 DIAGNOSIS — R002 Palpitations: Secondary | ICD-10-CM

## 2019-08-28 LAB — BASIC METABOLIC PANEL
BUN/Creatinine Ratio: 20 (ref 10–24)
BUN: 16 mg/dL (ref 8–27)
CO2: 24 mmol/L (ref 20–29)
Calcium: 9.4 mg/dL (ref 8.6–10.2)
Chloride: 99 mmol/L (ref 96–106)
Creatinine, Ser: 0.81 mg/dL (ref 0.76–1.27)
GFR calc Af Amer: 98 mL/min/{1.73_m2} (ref >59)
GFR calc non Af Amer: 85 mL/min/{1.73_m2} (ref >59)
Glucose: 140 mg/dL — ABNORMAL HIGH (ref 65–99)
Potassium: 3.7 mmol/L (ref 3.5–5.2)
Sodium: 139 mmol/L (ref 134–144)

## 2019-08-28 NOTE — Patient Instructions (Signed)
Medication Instructions:   Your physician recommends that you continue on your current medications as directed. Please refer to the Current Medication list given to you today.  If you need a refill on your cardiac medications before your next appointment, please call your pharmacy.   Lab work:  You will have labs drawn today: BMET  If you have labs (blood work) drawn today and your tests are completely normal, you will receive your results only by: Marland Kitchen MyChart Message (if you have MyChart) OR . A paper copy in the mail If you have any lab test that is abnormal or we need to change your treatment, we will call you to review the results.  Testing/Procedures:  Your physician has recommended that you wear an event monitor. Event monitors are medical devices that record the heart's electrical activity. Doctors most often [place] these monitors to diagnose arrhythmias. Arrhythmias are problems with the speed or rhythm of the heartbeat. The monitor is a small, portable device. You can wear one while you do your normal daily activities. This is usually used to diagnose what is causing palpitations/syncope (passing out).  Follow-Up:  After your 30 day monitor. Once we get the monitor results we will call to set up a follow up appointment.

## 2019-09-01 DIAGNOSIS — I499 Cardiac arrhythmia, unspecified: Secondary | ICD-10-CM | POA: Diagnosis not present

## 2019-09-01 DIAGNOSIS — Z09 Encounter for follow-up examination after completed treatment for conditions other than malignant neoplasm: Secondary | ICD-10-CM | POA: Diagnosis not present

## 2019-09-01 DIAGNOSIS — G459 Transient cerebral ischemic attack, unspecified: Secondary | ICD-10-CM | POA: Diagnosis not present

## 2019-09-08 ENCOUNTER — Telehealth: Payer: Self-pay | Admitting: *Deleted

## 2019-09-08 NOTE — Telephone Encounter (Signed)
[employer] to ship a 30 day cardiac event monitor to the patients home.  Instructions reviewed briefly as they are included in the monitor kit. 

## 2019-09-09 ENCOUNTER — Other Ambulatory Visit: Payer: Self-pay | Admitting: Gastroenterology

## 2019-09-16 DIAGNOSIS — H353111 Nonexudative age-related macular degeneration, right eye, early dry stage: Secondary | ICD-10-CM | POA: Diagnosis not present

## 2019-09-16 DIAGNOSIS — H35371 Puckering of macula, right eye: Secondary | ICD-10-CM | POA: Diagnosis not present

## 2019-09-16 DIAGNOSIS — H353222 Exudative age-related macular degeneration, left eye, with inactive choroidal neovascularization: Secondary | ICD-10-CM | POA: Diagnosis not present

## 2019-09-16 DIAGNOSIS — H43813 Vitreous degeneration, bilateral: Secondary | ICD-10-CM | POA: Diagnosis not present

## 2019-09-18 DIAGNOSIS — D225 Melanocytic nevi of trunk: Secondary | ICD-10-CM | POA: Diagnosis not present

## 2019-09-18 DIAGNOSIS — Z23 Encounter for immunization: Secondary | ICD-10-CM | POA: Diagnosis not present

## 2019-09-18 DIAGNOSIS — D485 Neoplasm of uncertain behavior of skin: Secondary | ICD-10-CM | POA: Diagnosis not present

## 2019-09-18 DIAGNOSIS — D0471 Carcinoma in situ of skin of right lower limb, including hip: Secondary | ICD-10-CM | POA: Diagnosis not present

## 2019-09-18 DIAGNOSIS — L821 Other seborrheic keratosis: Secondary | ICD-10-CM | POA: Diagnosis not present

## 2019-09-18 DIAGNOSIS — Z85828 Personal history of other malignant neoplasm of skin: Secondary | ICD-10-CM | POA: Diagnosis not present

## 2019-09-18 DIAGNOSIS — D224 Melanocytic nevi of scalp and neck: Secondary | ICD-10-CM | POA: Diagnosis not present

## 2019-09-18 DIAGNOSIS — D2239 Melanocytic nevi of other parts of face: Secondary | ICD-10-CM | POA: Diagnosis not present

## 2019-09-18 DIAGNOSIS — Z86018 Personal history of other benign neoplasm: Secondary | ICD-10-CM | POA: Diagnosis not present

## 2019-09-20 ENCOUNTER — Ambulatory Visit (INDEPENDENT_AMBULATORY_CARE_PROVIDER_SITE_OTHER): Payer: PPO

## 2019-09-20 DIAGNOSIS — I10 Essential (primary) hypertension: Secondary | ICD-10-CM

## 2019-09-20 DIAGNOSIS — G459 Transient cerebral ischemic attack, unspecified: Secondary | ICD-10-CM | POA: Diagnosis not present

## 2019-09-20 DIAGNOSIS — R002 Palpitations: Secondary | ICD-10-CM | POA: Diagnosis not present

## 2019-09-22 ENCOUNTER — Ambulatory Visit (INDEPENDENT_AMBULATORY_CARE_PROVIDER_SITE_OTHER): Payer: PPO | Admitting: Adult Health

## 2019-09-22 ENCOUNTER — Encounter: Payer: Self-pay | Admitting: Adult Health

## 2019-09-22 ENCOUNTER — Other Ambulatory Visit: Payer: Self-pay

## 2019-09-22 VITALS — BP 140/78 | HR 65 | Temp 98.0°F | Ht 74.0 in | Wt 190.2 lb

## 2019-09-22 DIAGNOSIS — E785 Hyperlipidemia, unspecified: Secondary | ICD-10-CM | POA: Diagnosis not present

## 2019-09-22 DIAGNOSIS — I10 Essential (primary) hypertension: Secondary | ICD-10-CM | POA: Diagnosis not present

## 2019-09-22 DIAGNOSIS — G459 Transient cerebral ischemic attack, unspecified: Secondary | ICD-10-CM

## 2019-09-22 DIAGNOSIS — Z9989 Dependence on other enabling machines and devices: Secondary | ICD-10-CM | POA: Diagnosis not present

## 2019-09-22 DIAGNOSIS — G4733 Obstructive sleep apnea (adult) (pediatric): Secondary | ICD-10-CM

## 2019-09-22 NOTE — Progress Notes (Signed)
I agree with the above plan 

## 2019-09-22 NOTE — Patient Instructions (Addendum)
Continue aspirin 81 mg daily  and Lipitor for secondary stroke prevention  Continue to follow up with PCP regarding cholesterol and blood pressure management   Continue to follow with Dr. Freddy Finner, NP for OSA on CPAP management/monitoring  Complete 30-day cardiac event monitor for possible atrial fibrillation  Continue to monitor blood pressure at home  Maintain strict control of hypertension with blood pressure goal below 130/90, diabetes with hemoglobin A1c goal below 6.5% and cholesterol with LDL cholesterol (bad cholesterol) goal below 70 mg/dL. I also advised the patient to eat a healthy diet with plenty of whole grains, cereals, fruits and vegetables, exercise regularly and maintain ideal body weight.         Thank you for coming to see [place] at [facility]. I hope we have been able to provide you high quality care today.  You may receive a patient satisfaction survey over the next few weeks. We would appreciate your feedback and comments so that we may continue to improve ourselves and the health of our patients.    Stroke Prevention Some medical conditions and lifestyle choices can lead to a higher risk for a stroke. You can help to prevent a stroke by making nutrition, lifestyle, and other changes. What nutrition changes can be made?   Eat healthy foods. ? Choose foods that are high in fiber. These include:  Fresh fruits.  Fresh vegetables.  Whole grains. ? Eat at least 5 or more servings of fruits and vegetables each day. Try to fill half of your plate at each meal with fruits and vegetables. ? Choose lean protein foods. These include:  Lowfat (lean) cuts of meat.  Chicken without skin.  Fish.  Tofu.  Beans.  Nuts. ? Eat low-fat dairy products. ? Avoid foods that:  Are high in salt (sodium).  Have saturated fat.  Have trans fat.  Have cholesterol.  Are processed.  Are premade.  Follow eating guidelines as told by your  doctor. These may include: ? Reducing how many calories you eat and drink each day. ? Limiting how much salt you eat or drink each day to 1,500 milligrams (mg). ? Using only healthy fats for cooking. These include:  Olive oil.  Canola oil.  Sunflower oil. ? Counting how many carbohydrates you eat and drink each day. What lifestyle changes can be made?  Try to stay at a healthy weight. Talk to your doctor about what a good weight is for you.  Get at least 30 minutes of moderate physical activity at least 5 days a week. This can include: ? Fast walking. ? Biking. ? Swimming.  Do not use any products that have nicotine or tobacco. This includes cigarettes and e-cigarettes. If you need help quitting, ask your doctor. Avoid being around tobacco smoke in general.  Limit how much alcohol you drink to no more than 1 drink a day for nonpregnant women and 2 drinks a day for men. One drink equals 12 oz of beer, 5 oz of wine, or 1 oz of hard liquor.  Do not use drugs.  Avoid taking birth control pills. Talk to your doctor about the risks of taking birth control pills if: ? You are over 68 years old. ? You smoke. ? You get migraines. ? You have had a blood clot. What other changes can be made?  Manage your cholesterol. ? It is important to eat a healthy diet. ? If your cholesterol cannot be managed through your diet, you may also need to  take medicines. Take medicines as told by your doctor.  Manage your diabetes. ? It is important to eat a healthy diet and to exercise regularly. ? If your blood sugar cannot be managed through diet and exercise, you may need to take medicines. Take medicines as told by your doctor.  Control your high blood pressure (hypertension). ? Try to keep your blood pressure below 130/80. This can help lower your risk of stroke. ? It is important to eat a healthy diet and to exercise regularly. ? If your blood pressure cannot be managed through diet and  exercise, you may need to take medicines. Take medicines as told by your doctor. ? Ask your doctor if you should check your blood pressure at home. ? Have your blood pressure checked every year. Do this even if your blood pressure is normal.  Talk to your doctor about getting checked for a sleep disorder. Signs of this can include: ? Snoring a lot. ? Feeling very tired.  Take over-the-counter and prescription medicines only as told by your doctor. These may include aspirin or blood thinners (antiplatelets or anticoagulants).  Make sure that any other medical conditions you have are managed. Where to find more information  American Stroke Association: www.strokeassociation.org  National Stroke Association: www.stroke.org Get help right away if:  You have any symptoms of stroke. "BE FAST" is an easy way to remember the main warning signs: ? B - Balance. Signs are dizziness, sudden trouble walking, or loss of balance. ? E - Eyes. Signs are trouble seeing or a sudden change in how you see. ? F - Face. Signs are sudden weakness or loss of feeling of the face, or the face or eyelid drooping on one side. ? A - Arms. Signs are weakness or loss of feeling in an arm. This happens suddenly and usually on one side of the body. ? S - Speech. Signs are sudden trouble speaking, slurred speech, or trouble understanding what people say. ? T - Time. Time to call emergency services. Write down what time symptoms started.  You have other signs of stroke, such as: ? A sudden, very bad headache with no known cause. ? Feeling sick to your stomach (nausea). ? Throwing up (vomiting). ? Jerky movements you cannot control (seizure). These symptoms may represent a serious problem that is an emergency. Do not wait to see if the symptoms will go away. Get medical help right away. Call your local emergency services (911 in the [place] Do not drive yourself to the hospital. Summary  You can prevent a stroke by  eating healthy, exercising, not smoking, drinking less alcohol, and treating other health problems, such as diabetes, high blood pressure, or high cholesterol.  Do not use any products that contain nicotine or tobacco, such as cigarettes and e-cigarettes.  Get help right away if you have any signs or symptoms of a stroke. This information is not intended to replace advice given to you by your health care provider. Make sure you discuss any questions you have with your health care provider. Document Released: 06/04/2012 Document Revised: 01/30/2019 Document Reviewed: 03/07/2017 Elsevier Patient Education  2020 [employer].

## 2019-09-22 NOTE — Progress Notes (Signed)
[facility] [address] [place]. [place] [address] [phone]       HOSPITAL FOLLOW UP NOTE  Mr. Jonathan Lane Date of Birth:  1941/09/14 Medical Record Number:  [id number]   Reason for Referral:  hospital stroke follow up    CHIEF COMPLAINT:  Chief Complaint  Patient presents with   Hospitalization Follow-up    Alone. Treatment room. No new concerns at this time.     HPI: Jonathan Lane being seen today for in office hospital follow-up regarding TIA on 08/25/2019.  History obtained from patient and chart review. Reviewed all radiology images and labs personally.  Mr. Jonathan Lane is a 78 y.o. male with history of palpitations,  hypertension, hyperlipidemia, sleep apnea  on CPAP presented on 08/25/2019 with left-sided numbness.   Stroke work-up completed his symptoms likely related to right brain TIA.  CT head and MRI head negative for acute abnormalities.  Imaging did show chronic small vessel disease and old lacunes periventricular white matter and basal ganglia.  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  2D echo normal EF without cardiac source of embolus identified.  LDL 75.  A1c 5.6.  Recommended DAPT for 3 weeks and Plavix alone as previously on aspirin.  History of palpitations without diagnosis of AF and recommended follow-up with cardiology for 30-day cardiac event monitor.  HTN stable.  HLD stable and recommended continuation of atorvastatin and fish oil.  Other stroke risk factors include advanced age, family history of stroke and OSA on CPAP at home.  He was discharged home in stable condition without therapy needs has no residual deficits.  Mr. [name] is being seen today for TIA follow-up.  He has been doing well since discharge without recurring or new stroke/TIA symptoms.   Completed 3 weeks DAPT and continues on Plavix alone without bleeding or bruising.  Continues on atorvastatin without myalgias.  Blood pressure today 140/78. He does monitor at home with slight elevation in the morning around [id number] systolic. This lowers after his AM exercising around 130s.  Initiated 30-day cardiac event monitor on 09/19/2019 and continues to follow with cardiology.  Ongoing compliance with OSA for CPAP.  He questions symptoms related to TIA versus being under increased stress with recent passing of his wife on 08/16/2019.  He continues to live alone but is able to maintain ADLs and IADLs independently.  He has had 1 fall but this was while helping a neighbor build his deck and was not aware that the step boards were not secured leading to a fall.  Thankfully, sustained no injury except for mild bruising.  Denies any other fall.  No further concerns at this time.    ROS:   14 system review of systems performed and negative with exception of see HPI  PMH:  Past Medical History:  Diagnosis Date   Atrial fibrillation (HCC)    Bradycardia    Bradycardia    Cardiac murmur    Dizziness    Glaucoma    Heart palpitations    Hyperlipidemia    Hypertension    Numbness 08/26/2019   LEFT FACE   Skin abnormalities    pre cancerous lesion R hand   Sleep apnea    uses cpap   Snoring     PSH:  Past Surgical History:  Procedure Laterality Date   APPENDECTOMY  1965   COLONOSCOPY     POLYPECTOMY     TONSILLECTOMY     as a child    Social History:  Social History   Socioeconomic History   Marital status: Married    Spouse name: Jonathan Lane   Number of children: 1   Years of education: Masters   Highest education level: Not on file  Occupational History    Employer: RETIRED  Social Designer, fashion/clothing strain: Not on file   Food insecurity    Worry: Not on file    Inability: Not on file   Transportation needs    Medical: Not on file     Non-medical: Not on file  Tobacco Use   Smoking status: Former Smoker    Types: Cigarettes    Quit date: 12/18/1965    Years since quitting: 53.7   Smokeless tobacco: Never Used  Substance and Sexual Activity   Alcohol use: Yes    Comment: 5-7 drinks per week   Drug use: No   Sexual activity: Not on file  Lifestyle   Physical activity    Days per week: Not on file    Minutes per session: Not on file   Stress: Not on file  Relationships   Social connections    Talks on phone: Not on file    Gets together: Not on file    Attends religious service: Not on file    Active member of club or organization: Not on file    Attends meetings of clubs or organizations: Not on file    Relationship status: Not on file   Intimate partner violence    Fear of current or ex partner: Not on file    Emotionally abused: Not on file    Physically abused: Not on file    Forced sexual activity: Not on file  Other Topics Concern   Not on file  Social History Narrative   Patient is married Jonathan Lane).   Patient is retired.   Patient has one adult child.   Patient does not drink any caffeine.   Patient is right-handed.   Patient has a Scientist, water quality.             Family History:  Family History  Problem Relation Age of Onset   Stroke Father    Stroke Mother    Stroke Sister    Breast cancer Sister    High blood pressure Sister    Diabetes Sister    Colon cancer Other        Uncle   Colon cancer Paternal Uncle    Mitral valve prolapse Other        sugery 09-27-2017   Rectal cancer Neg Hx    Stomach cancer Neg Hx     Medications:   Current Outpatient Medications on File Prior to Visit  Medication Sig Dispense Refill   Apoaequorin (PREVAGEN PO) Take 1 capsule by mouth daily. [facility] and memory.      atorvastatin (LIPITOR) 20 MG tablet Take 1 tablet (20 mg total) by mouth daily. 30 tablet 0   clopidogrel (PLAVIX) 75 MG tablet Take 1 tablet (75 mg total) by  mouth daily. 30 tablet 0   Coenzyme Q10 (CO Q 10 PO) Take 1 capsule by mouth daily.      COSOPT PF 22.3-6.8 MG/ML SOLN Place 1 drop into both eyes 2 (two) times daily.      Cranberry 400 MG CAPS Take 1 capsule by mouth  daily.     diltiazem (DILACOR XR) 240 MG 24 hr capsule Take 1 capsule (240 mg total) by mouth daily. 90 capsule 3   docusate sodium (COLACE) 100 MG capsule Take 200 mg by mouth daily.     esomeprazole (NEXIUM) 40 MG capsule TAKE 1 CAPSULE TWICE DAILY BEFORE MEALS 60 capsule 0   fish oil-omega-3 fatty acids 1000 MG capsule Take 1 g by mouth 2 (two) times daily.      Glucosamine-Chondroitin (MOVE FREE PO) Take 1 tablet by mouth daily.     latanoprost (XALATAN) 0.005 % ophthalmic solution Place 1 drop into both eyes at bedtime.     losartan-hydrochlorothiazide (HYZAAR) 100-25 MG tablet Take 1 tablet by mouth daily.     Misc Natural Products (PROSTATE HEALTH) CAPS Take 1 capsule by mouth daily.     multivitamin (THERAGRAN) per tablet Take 1 tablet by mouth daily.     nitroGLYCERIN (NITROSTAT) 0.4 MG SL tablet Place 1 tablet (0.4 mg total) under the tongue every 5 (five) minutes as needed for chest pain. 10 tablet 1   oxymetazoline (AFRIN) 0.05 % nasal spray Place 1 spray into both nostrils daily as needed for congestion.     Probiotic Product (ALIGN PO) Take 1 tablet by mouth daily. Take daily      RHOPRESSA 0.02 % SOLN Place 1 drop into both eyes at bedtime.      valACYclovir (VALTREX) 1000 MG tablet Take 1,000 mg by mouth daily as needed (outbreak).      No current facility-administered medications on file prior to visit.     Allergies:   Allergies  Allergen Reactions   Brimonidine Tartrate-Timolol Itching    Itching eyes Itching eyes, rash     Physical Exam  Vitals:   09/22/19 1357  BP: 140/78  Pulse: 65  Temp: 98 F (36.7 C)  TempSrc: Oral  Weight: 190 lb 3.2 oz (86.3 kg)  Height: 6\' 2"  (1.88 m)   Body mass index is 24.42 kg/m. No exam  data present  General: well developed, well nourished,  pleasant elderly Caucasian male, seated, in no evident distress Head: head normocephalic and atraumatic.   Neck: supple with no carotid or supraclavicular bruits Cardiovascular: regular rate and rhythm, no murmurs Musculoskeletal: no deformity Skin:  no rash/petichiae Vascular:  Normal pulses all extremities   Neurologic Exam Mental Status: Awake and fully alert. Oriented to place and time. Recent and remote memory intact. Attention span, concentration and fund of knowledge appropriate. Mood and affect appropriate.  Cranial Nerves: Fundoscopic exam reveals sharp disc margins. Pupils equal, briskly reactive to light. Extraocular movements full without nystagmus. Visual fields full to confrontation. Hearing intact. Facial sensation intact. Face, tongue, palate moves normally and symmetrically.  Motor: Normal bulk and tone. Normal strength in all tested extremity muscles. Sensory.: intact to touch , pinprick , position and vibratory sensation.  Coordination: Rapid alternating movements normal in all extremities. Finger-to-nose and heel-to-shin performed accurately bilaterally.  Gait and Station: Arises from chair without difficulty. Stance is normal. Gait demonstrates normal stride length and balance Reflexes: 1+ and symmetric. Toes downgoing.     NIHSS  0 Modified Rankin  0   Diagnostic Data (Labs, Imaging, Testing)  CT HEAD WO CONTRAST 08/25/2019 IMPRESSION: 1. No acute intracranial abnormality. 2. Scattered areas of low-density in the periventricular white matter and left basal ganglia. 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  CT ANGIO HEAD W OR WO CONTRAST CT ANGIO NECK  W OR WO CONTRAST 08/25/2019 IMPRESSION: 1. 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 More distal vessel is reconstituted. 2. High-grade stenosis of the distal left V4 segment just  proximal to the vertebrobasilar junction. 3. Mild irregularity of the basilar artery without a significant stenosis. 4. Atherosclerotic changes at the cavernous internal carotid arteries bilaterally without significant stenosis. 5. No significant anterior circulation stenosis or occlusion in the circle-of-Willis. 6. Atherosclerotic changes at the left carotid bifurcation without significant stenosis. 7. Mild degenerative changes in the cervical spine.  MR BRAIN WO CONTRAST 08/26/2019 IMPRESSION: 1.  No acute intracranial abnormality. 2. Mild for age signal changes in the brain most commonly due to chronic small vessel disease.  ECHOCARDIOGRAM 08/18/2019 IMPRESSIONS  1. The left ventricle has hyperdynamic systolic function, with an ejection fraction of >65%. The cavity size was normal. There is moderately increased left ventricular wall thickness. Left ventricular diastolic Doppler parameters are consistent with  impaired relaxation.  2. The right ventricle has normal systolic function. The cavity was normal. There is no increase in right ventricular wall thickness.  3. Mild calcification of the mitral valve leaflet. There is mild mitral annular calcification present. No evidence of mitral valve stenosis. No significant mitral regurgitation.  4. The aortic valve is tricuspid. Mild calcification of the aortic valve. No stenosis of the aortic valve.  5. The inferior vena cava was dilated in size with <50% respiratory variability. No complete TR doppler jet so unable to estimate PA systolic pressure.  6. The aortic root is normal in size and structure.  7. Trivial pericardial effusion is present.    ASSESSMENT: HENDRYX SNIDOW is a 78 y.o. year old male presented with left-sided numbness on 08/25/2019 likely related to a right brain TIA. Vascular risk factors include HTN, HLD and OSA.  Overall stable from a neurological standpoint without reoccurring or new stroke/TIA  symptoms.    PLAN:  1. TIA: Continue clopidogrel 75 mg daily  and atorvastatin for secondary stroke prevention. Maintain strict control of hypertension with blood pressure goal below 130/90, diabetes with hemoglobin A1c goal below 6.5% and cholesterol with LDL cholesterol (bad cholesterol) goal below 70 mg/dL.  I also advised the patient to eat a healthy diet with plenty of whole grains, cereals, fruits and vegetables, exercise regularly with at least 30 minutes of continuous activity daily and maintain ideal body weight.  Complete 30-day cardiac event monitor for possible atrial fibrillation with history of palpitations. 2. HTN: Advised to continue current treatment regimen.  Today's BP stable.  Advised to continue to monitor at home along with continued follow-up with PCP for management 3. HLD: Advised to continue current treatment regimen along with continued follow-up with PCP for future prescribing and monitoring of lipid panel 4. OSA on CPAP: Ongoing compliance with CPAP for OSA management -continues to follow at [facility] for OSA monitoring    Follow-up as needed   Greater than 50% of time during this 45 minute visit was spent on counseling, explanation of diagnosis of TIA, reviewing risk factor management of HTN, HLD and OSA on CPAP, planning of further management along with potential future management, and discussion with patient and family answering all questions.    Frann Rider, AGNP-BC  [facility] [address] [place], [place] [address]  Phone [phone] Fax [phone] Note: This document was prepared with digital dictation and possible smart phrase technology. Any transcriptional errors that result from this process are unintentional.

## 2019-09-26 ENCOUNTER — Emergency Department (HOSPITAL_COMMUNITY)
Admission: EM | Admit: 2019-09-26 | Discharge: 2019-09-26 | Discharge status: Against Medical Advice | Payer: PPO | Attending: Emergency Medicine | Admitting: Emergency Medicine

## 2019-09-26 ENCOUNTER — Encounter (HOSPITAL_COMMUNITY): Payer: Self-pay | Admitting: Emergency Medicine

## 2019-09-26 ENCOUNTER — Other Ambulatory Visit: Payer: Self-pay

## 2019-09-26 DIAGNOSIS — M79601 Pain in right arm: Secondary | ICD-10-CM | POA: Diagnosis present

## 2019-09-26 DIAGNOSIS — M79604 Pain in right leg: Secondary | ICD-10-CM | POA: Insufficient documentation

## 2019-09-26 DIAGNOSIS — Z5321 Procedure and treatment not carried out due to patient leaving prior to being seen by health care provider: Secondary | ICD-10-CM | POA: Diagnosis not present

## 2019-09-26 DIAGNOSIS — T148XXA Other injury of unspecified body region, initial encounter: Secondary | ICD-10-CM | POA: Diagnosis not present

## 2019-09-26 LAB — COMPREHENSIVE METABOLIC PANEL
ALT: 20 U/L (ref 0–44)
AST: 26 U/L (ref 15–41)
Albumin: 3.6 g/dL (ref 3.5–5.0)
Alkaline Phosphatase: 80 U/L (ref 38–126)
Anion gap: 12 (ref 5–15)
BUN: 13 mg/dL (ref 8–23)
CO2: 27 mmol/L (ref 22–32)
Calcium: 9.2 mg/dL (ref 8.9–10.3)
Chloride: 102 mmol/L (ref 98–111)
Creatinine, Ser: 0.87 mg/dL (ref 0.61–1.24)
GFR calc Af Amer: >60 mL/min (ref >60)
GFR calc non Af Amer: >60 mL/min (ref >60)
Glucose, Bld: 107 mg/dL — ABNORMAL HIGH (ref 70–99)
Potassium: 3.5 mmol/L (ref 3.5–5.1)
Sodium: 141 mmol/L (ref 135–145)
Total Bilirubin: 1.1 mg/dL (ref 0.3–1.2)
Total Protein: 6.4 g/dL — ABNORMAL LOW (ref 6.5–8.1)

## 2019-09-26 LAB — CBC WITH DIFFERENTIAL/PLATELET
Abs Immature Granulocytes: 0.03 10*3/uL (ref 0.00–0.07)
Basophils Absolute: 0.1 10*3/uL (ref 0.0–0.1)
Basophils Relative: 1 %
Eosinophils Absolute: 0.2 10*3/uL (ref 0.0–0.5)
Eosinophils Relative: 3 %
HCT: 40.8 % (ref 39.0–52.0)
Hemoglobin: 13.2 g/dL (ref 13.0–17.0)
Immature Granulocytes: 0 %
Lymphocytes Relative: 30 %
Lymphs Abs: 2.2 10*3/uL (ref 0.7–4.0)
MCH: 32 pg (ref 26.0–34.0)
MCHC: 32.4 g/dL (ref 30.0–36.0)
MCV: 99 fL (ref 80.0–100.0)
Monocytes Absolute: 0.9 10*3/uL (ref 0.1–1.0)
Monocytes Relative: 12 %
Neutro Abs: 4 10*3/uL (ref 1.7–7.7)
Neutrophils Relative %: 54 %
Platelets: 197 10*3/uL (ref 150–400)
RBC: 4.12 MIL/uL — ABNORMAL LOW (ref 4.22–5.81)
RDW: 14 % (ref 11.5–15.5)
WBC: 7.4 10*3/uL (ref 4.0–10.5)
nRBC: 0 % (ref 0.0–0.2)

## 2019-09-26 NOTE — ED Notes (Signed)
Called pt to be room 2 more times, still no answer. Check patients arm band, pt is not in lobby.

## 2019-09-26 NOTE — ED Triage Notes (Signed)
Patient states that he fell last week on right arm and right leg.  Patient has bruising area to right leg, old bruise but does have hard knot in area.  He also has a old bruise on right arm with a knot, he has minimal pain in the area.  Patient states that he is worried that he has a blood clot in his arm.  Patient is on Plavix.

## 2019-09-26 NOTE — ED Notes (Signed)
Called for pt to be room, no response

## 2019-10-16 DIAGNOSIS — H2513 Age-related nuclear cataract, bilateral: Secondary | ICD-10-CM | POA: Diagnosis not present

## 2019-10-16 DIAGNOSIS — H2512 Age-related nuclear cataract, left eye: Secondary | ICD-10-CM | POA: Diagnosis not present

## 2019-10-16 DIAGNOSIS — H18413 Arcus senilis, bilateral: Secondary | ICD-10-CM | POA: Diagnosis not present

## 2019-10-16 DIAGNOSIS — H353133 Nonexudative age-related macular degeneration, bilateral, advanced atrophic without subfoveal involvement: Secondary | ICD-10-CM | POA: Diagnosis not present

## 2019-10-16 DIAGNOSIS — H25043 Posterior subcapsular polar age-related cataract, bilateral: Secondary | ICD-10-CM | POA: Diagnosis not present

## 2019-10-16 DIAGNOSIS — H25013 Cortical age-related cataract, bilateral: Secondary | ICD-10-CM | POA: Diagnosis not present

## 2019-10-16 DIAGNOSIS — H401132 Primary open-angle glaucoma, bilateral, moderate stage: Secondary | ICD-10-CM | POA: Diagnosis not present

## 2019-10-23 DIAGNOSIS — G4733 Obstructive sleep apnea (adult) (pediatric): Secondary | ICD-10-CM | POA: Diagnosis not present

## 2019-10-29 DIAGNOSIS — H2512 Age-related nuclear cataract, left eye: Secondary | ICD-10-CM | POA: Diagnosis not present

## 2019-10-30 DIAGNOSIS — H25041 Posterior subcapsular polar age-related cataract, right eye: Secondary | ICD-10-CM | POA: Diagnosis not present

## 2019-11-09 IMAGING — CT CT ANGIO CHEST-ABD-PELV FOR DISSECTION W/ AND WO/W CM
3 of 10 series · 9 of 46 positions shown, 14 images · IV contrast (OMNI 350)
Comparison: Prior radiograph from earlier same day.

CLINICAL DATA: Initial evaluation for acute chest pain.

EXAM:
CT ANGIOGRAPHY CHEST, ABDOMEN AND PELVIS
TECHNIQUE: Multidetector CT imaging through the chest, abdomen and pelvis was
performed using the standard protocol during bolus administration of
intravenous contrast. Multiplanar reconstructed images and MIPs were
obtained and reviewed to evaluate the vascular anatomy.
CONTRAST:  100mL OMNIPAQUE IOHEXOL 350 MG/ML SOLN

[Series 7: dissection 2mm · axial · 0.82mm/px · z∈[+1010,+1358]mm · 3 of 350 slices shown, 7 images]
[im 88/350  soft-tissue]
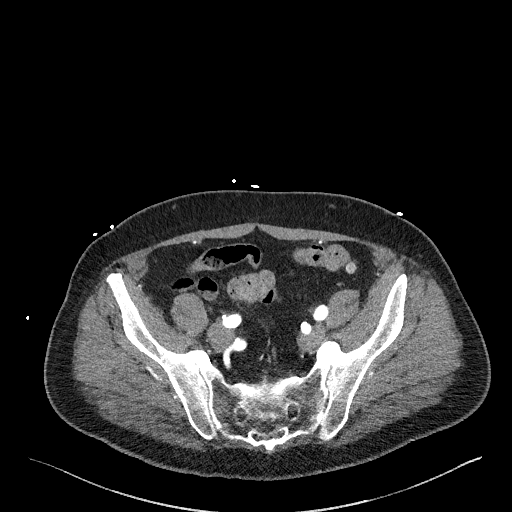
[im 88/350  lung]
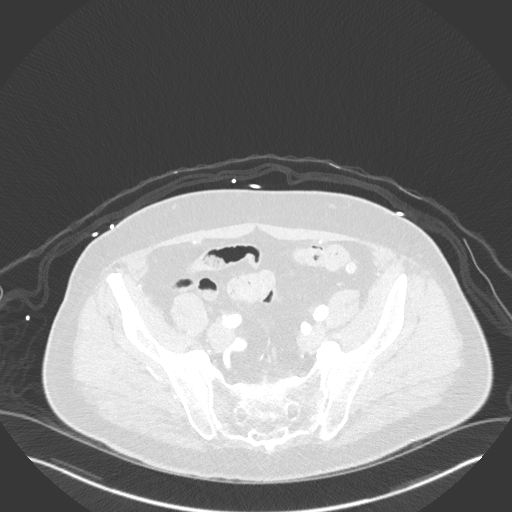
[im 88/350  bone]
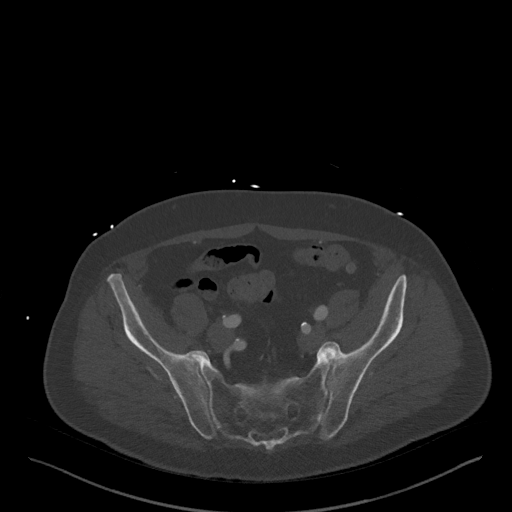
[im 175/350  soft-tissue]
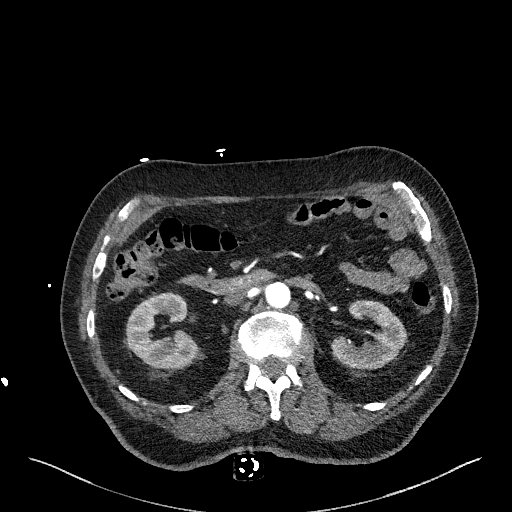
[im 175/350  lung]
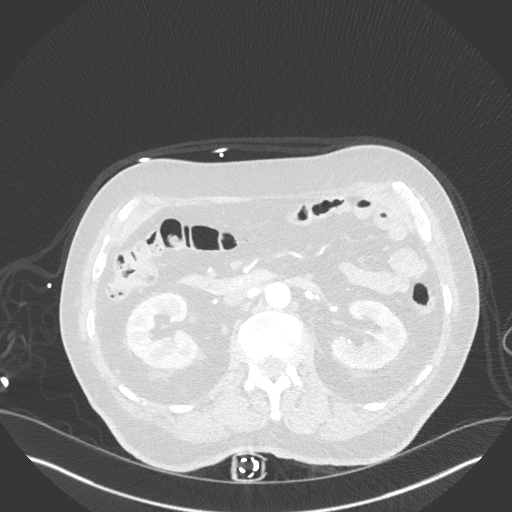
[im 262/350  soft-tissue]
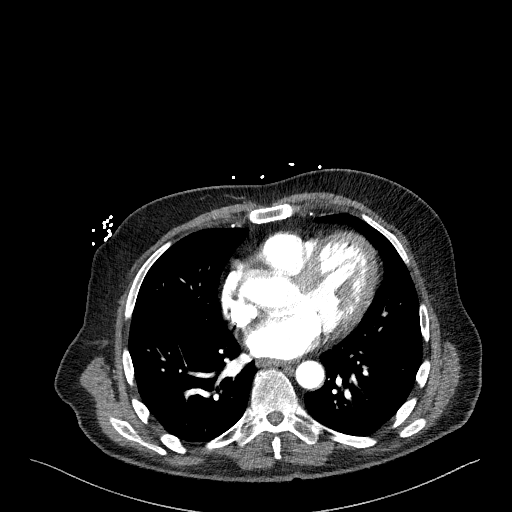
[im 262/350  lung]
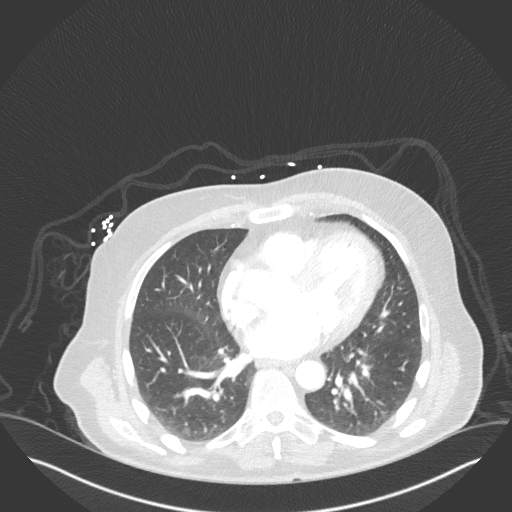

[Series 8: dissection thins · axial · 0.82mm/px · z∈[+902,+1068]mm · 3 of 1397 slices shown]
[im 133/1397  soft-tissue]
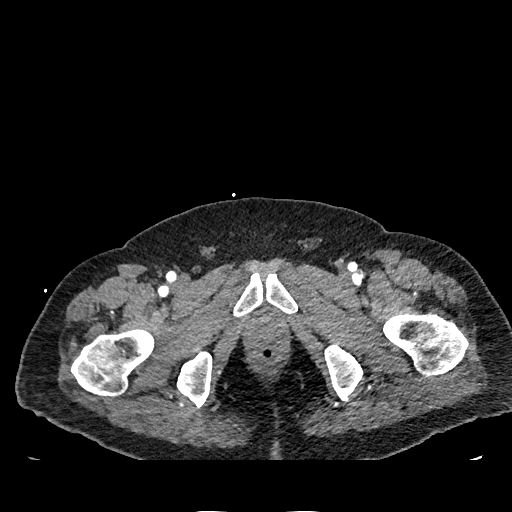
[im 266/1397  soft-tissue]
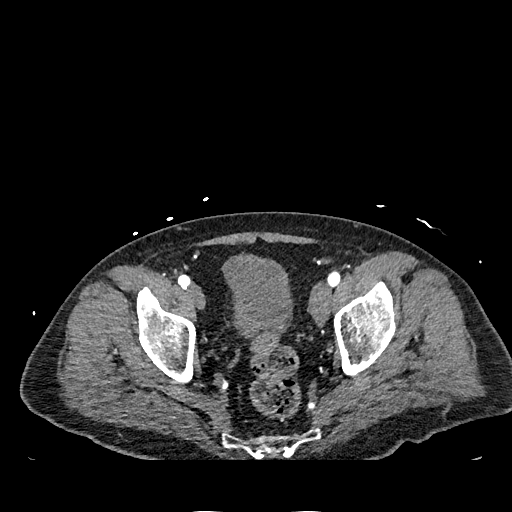
[im 466/1397  soft-tissue]
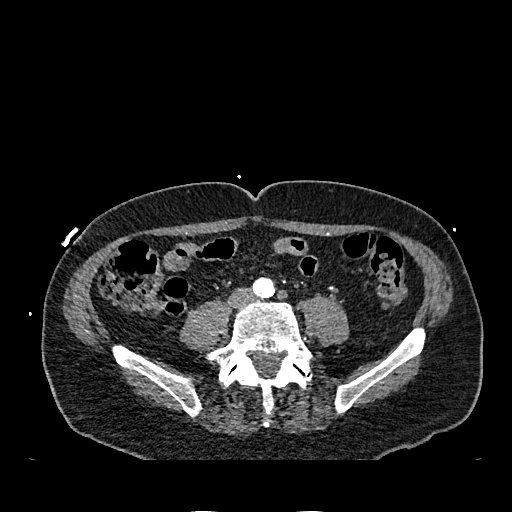

[Series 10: dissection 2mm cor · coronal · 0.83mm/px · 3 of 151 slices shown, 4 images]
[im 38/151  soft-tissue]
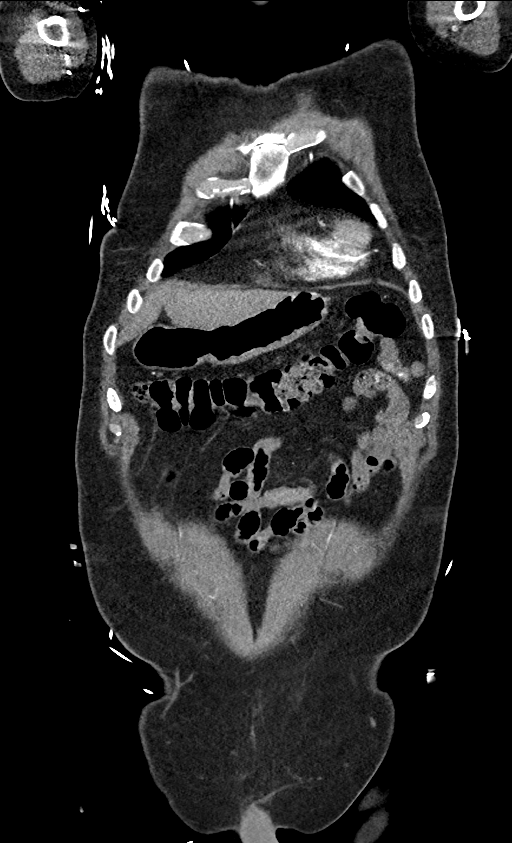
[im 76/151  soft-tissue]
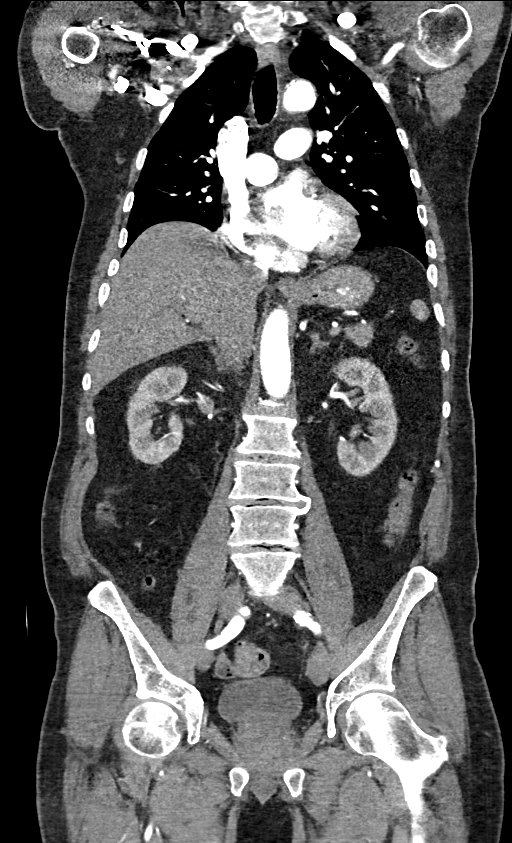
[im 76/151  bone]
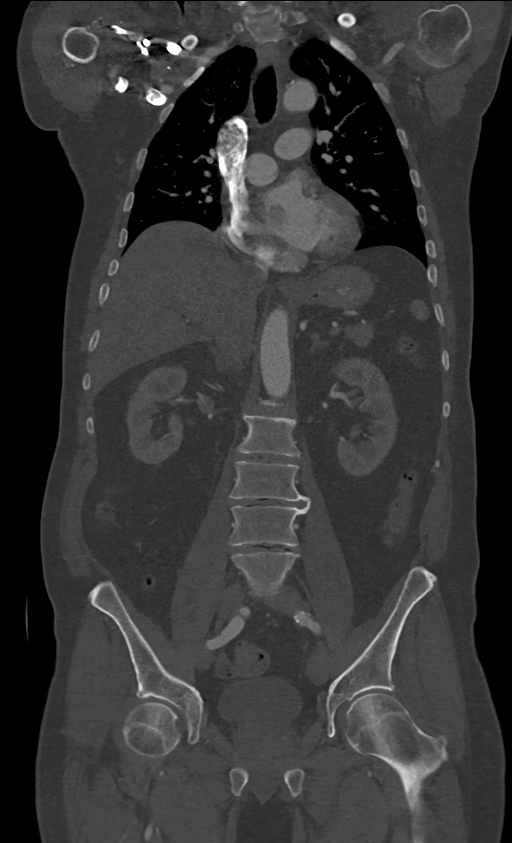
[im 113/151  soft-tissue]
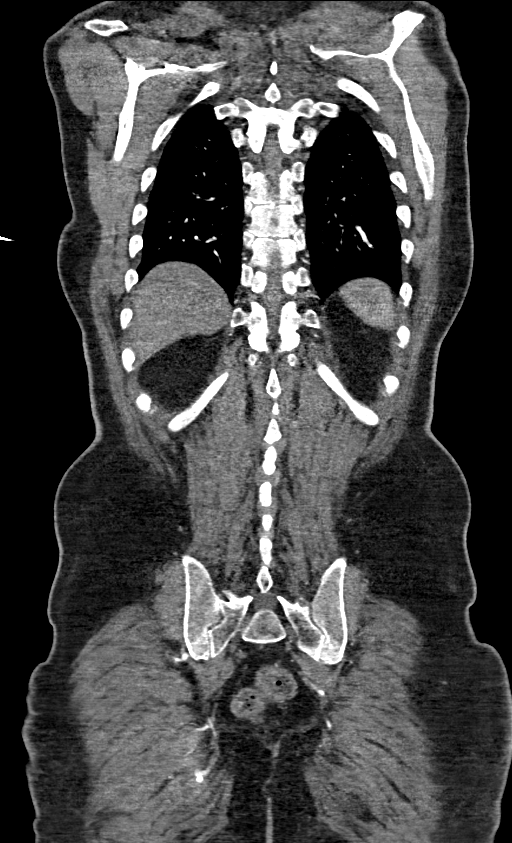

[9 of 46 positions shown; findings below may reference images not displayed]

FINDINGS: CTA CHEST FINDINGS

Cardiovascular: Precontrast imaging through the intrathoracic aorta
demonstrates no mural thrombus or other acute abnormality. Mild to
moderate scattered atheromatous plaque within the aortic arch.
Postcontrast imaging demonstrates no evidence for dissection or
other acute aortic pathology. Partially visualized great vessels
intact. Heart size normal. Scattered 3 vessel coronary artery
calcifications, most notable in the LAD. No pericardial effusion.
Limited assessment of the pulmonary arterial tree grossly
unremarkable.

Mediastinum/Nodes: Thyroid within normal limits. No pathologically
enlarged mediastinal, hilar, or axillary lymph nodes. Esophagus
within normal limits.

Lungs/Pleura: Tracheobronchial tree intact and patent. Lungs well
inflated. No focal infiltrates. No pulmonary edema or pleural
effusion. No pneumothorax. No worrisome pulmonary nodule or mass.

Musculoskeletal: External soft tissues demonstrate no acute finding.
No acute osseous abnormality. No discrete lytic or blastic osseous
lesions.

Review of the MIP images confirms the above findings.

CTA ABDOMEN AND PELVIS FINDINGS

VASCULAR

Aorta: Normal intravascular enhancement seen throughout the
intra-abdominal aorta without evidence for dissection or other acute
abnormality. Moderate aorto bi-iliac atherosclerotic disease. No
aneurysm.

Celiac: Celiac axis and its branch vessels are widely patent and
well opacified.

SMA: SMA widely patent without acute abnormality.

Renals: Single right renal artery, with 2 left-sided renal arteries,
all of which are widely patent without abnormality.

IMA: IMA widely patent to its distal aspect.

Inflow: Moderate atherosclerotic change present throughout the
common and proximal external and internal iliac arteries
bilaterally. Mild aneurysmal dilatation of the right common iliac
artery up to 18 mm. Superimposed penetrating atheromatous ulcer at
the proximal right common iliac (series 7, image 244). Iliac
arteries otherwise widely patent without acute abnormality.

Veins: No appreciable venous abnormality allowing for timing of the
contrast bolus.

Review of the MIP images confirms the above findings.

NON-VASCULAR

Hepatobiliary: Liver demonstrates a normal contrast enhanced
appearance. Cholelithiasis without evidence for acute cholecystitis.
No biliary dilatation.

Pancreas: Pancreas within normal limits.

Spleen: Spleen within normal limits.

Adrenals/Urinary Tract: Adrenal glands are normal. Kidneys equal in
size with symmetric enhancement. 9 mm nonobstructive stone present
within the lower pole the left kidney. No other radiopaque calculi.
No hydronephrosis or focal enhancing renal mass. No hydroureter.
Partially distended bladder within normal limits.

Stomach/Bowel: Stomach within normal limits. No evidence for bowel
obstruction. Appendix not visualize, consistent with history of
prior appendectomy. Sigmoid diverticulosis without evidence for
acute diverticulitis. No acute inflammatory changes seen about the
bowels.

Lymphatic: No pathologically enlarged intra-abdominopelvic lymph
nodes.

Reproductive: Prostate mildly enlarged measuring 5.3 cm in
transverse diameter.

Other: No free air or fluid.

Musculoskeletal: No acute osseous abnormality. No discrete lytic or
blastic osseous lesions. Multilevel facet arthropathy noted within
the lower lumbar spine.

Review of the MIP images confirms the above findings.
IMPRESSION: 1. No CT evidence for acute aortic dissection or other acute aortic
pathology.
2. Aneurysmal dilatation of the right common iliac artery up to 18
mm with superimposed penetrating atheromatous ulcer as above.
3. Moderate aorto bi-iliac atherosclerotic disease, with scattered 3
vessel coronary artery calcifications.
4. Cholelithiasis.
5. Sigmoid diverticulosis without evidence for acute diverticulitis.
6. Nonobstructive left renal nephrolithiasis.

## 2019-11-12 DIAGNOSIS — E782 Mixed hyperlipidemia: Secondary | ICD-10-CM | POA: Diagnosis not present

## 2019-11-12 DIAGNOSIS — K219 Gastro-esophageal reflux disease without esophagitis: Secondary | ICD-10-CM | POA: Diagnosis not present

## 2019-11-12 DIAGNOSIS — I10 Essential (primary) hypertension: Secondary | ICD-10-CM | POA: Diagnosis not present

## 2019-11-12 DIAGNOSIS — Z0000 Encounter for general adult medical examination without abnormal findings: Secondary | ICD-10-CM | POA: Diagnosis not present

## 2019-11-12 DIAGNOSIS — K5900 Constipation, unspecified: Secondary | ICD-10-CM | POA: Diagnosis not present

## 2019-11-12 DIAGNOSIS — F4321 Adjustment disorder with depressed mood: Secondary | ICD-10-CM | POA: Diagnosis not present

## 2019-11-26 DIAGNOSIS — H59022 Cataract (lens) fragments in eye following cataract surgery, left eye: Secondary | ICD-10-CM | POA: Diagnosis not present

## 2019-11-26 DIAGNOSIS — H2511 Age-related nuclear cataract, right eye: Secondary | ICD-10-CM | POA: Diagnosis not present

## 2019-11-28 DIAGNOSIS — H59022 Cataract (lens) fragments in eye following cataract surgery, left eye: Secondary | ICD-10-CM | POA: Diagnosis not present

## 2019-12-03 DIAGNOSIS — I10 Essential (primary) hypertension: Secondary | ICD-10-CM | POA: Diagnosis not present

## 2019-12-30 DIAGNOSIS — N5201 Erectile dysfunction due to arterial insufficiency: Secondary | ICD-10-CM | POA: Diagnosis not present

## 2020-01-13 DIAGNOSIS — H353111 Nonexudative age-related macular degeneration, right eye, early dry stage: Secondary | ICD-10-CM | POA: Diagnosis not present

## 2020-01-13 DIAGNOSIS — H35371 Puckering of macula, right eye: Secondary | ICD-10-CM | POA: Diagnosis not present

## 2020-01-13 DIAGNOSIS — H43392 Other vitreous opacities, left eye: Secondary | ICD-10-CM | POA: Diagnosis not present

## 2020-01-13 DIAGNOSIS — H353222 Exudative age-related macular degeneration, left eye, with inactive choroidal neovascularization: Secondary | ICD-10-CM | POA: Diagnosis not present

## 2020-01-20 DIAGNOSIS — N5201 Erectile dysfunction due to arterial insufficiency: Secondary | ICD-10-CM | POA: Diagnosis not present

## 2020-02-25 NOTE — Progress Notes (Signed)
Cardiology Office Note   Date:  03/10/2020   ID:  CLEVELAND ADELSON, DOB 1941/03/09, MRN [id number]  PCP:  Glenis Smoker, MD  Cardiologist:  Dr. Johnsie Cancel, MD   No chief complaint on file.   History of Present Illness: Jonathan Lane is a 79 y.o. male with a hx of atypical chest pain, HTN, bradycardia, OSA with CPAP use and palpitations with no documented AF who was seen in hospital consultation 08/18/2019 for the evaluation of chest pain His wife had passed away 3 days before this and he was under a lot of stress Had a prior myovue 03/26/18 that was normal  He r/o and was d/c   He then presented 08/25/2019 with left-sided facial numbness and left upper arm numbness that began on morning of presentation who was seen by neurology and found to have TIA.  MRI was normal  CTA with vertebral disease and distal left V4 segment proximal to VB junction  Dr. Erlinda Hong recommended ASA and Plavix for 3 weeks followed by Plavix alone.  His Lipitor was increased from 10 to 20 mg daily for secondary stroke prevention.  Also with recommendations for 30-day event monitor This was done 10/27/19 and no significant arrhythmias noted   TTE 08/18/19 EF >65% no significant valve disease   Wife died unexpectedly August 29 th Appears she was admitted with sepsis And multi organ failure. He was not happy with her care and felt she was transferred to the ICU too late.   Past Medical History:  Diagnosis Date  . Atrial fibrillation (Leslie)   . Bradycardia   . Bradycardia   . Cardiac murmur   . Dizziness   . Glaucoma   . Heart palpitations   . Hyperlipidemia   . Hypertension   . Numbness 08/26/2019   LEFT FACE  . Skin abnormalities    pre cancerous lesion R hand  . Sleep apnea    uses cpap  . Snoring     Past Surgical History:  Procedure Laterality Date  . APPENDECTOMY  1965  . COLONOSCOPY    . POLYPECTOMY    . TONSILLECTOMY     as a child     Current Outpatient Medications  Medication Sig  Dispense Refill  . Apoaequorin (PREVAGEN PO) Take 1 capsule by mouth daily. Focus Health and memory.     Marland Kitchen atorvastatin (LIPITOR) 20 MG tablet Take 1 tablet (20 mg total) by mouth daily. 30 tablet 0  . clopidogrel (PLAVIX) 75 MG tablet Take 1 tablet (75 mg total) by mouth daily. 30 tablet 0  . Coenzyme Q10 (CO Q 10 PO) Take 1 capsule by mouth daily.     . COSOPT PF 22.3-6.8 MG/ML SOLN Place 1 drop into both eyes 2 (two) times daily.     . Cranberry 400 MG CAPS Take 1 capsule by mouth daily.    Marland Kitchen diltiazem (DILACOR XR) 240 MG 24 hr capsule Take 1 capsule (240 mg total) by mouth daily. 90 capsule 3  . docusate sodium (COLACE) 100 MG capsule Take 200 mg by mouth daily.    Marland Kitchen esomeprazole (NEXIUM) 40 MG capsule TAKE 1 CAPSULE TWICE DAILY BEFORE MEALS 60 capsule 0  . fish oil-omega-3 fatty acids 1000 MG capsule Take 1 g by mouth 2 (two) times daily.     . Glucosamine-Chondroitin (MOVE FREE PO) Take 1 tablet by mouth daily.    Marland Kitchen losartan-hydrochlorothiazide (HYZAAR) 100-25 MG tablet Take 1 tablet by mouth daily.    Marland Kitchen  Misc Natural Products (PROSTATE HEALTH) CAPS Take 1 capsule by mouth daily.    . multivitamin (THERAGRAN) per tablet Take 1 tablet by mouth daily.    . nitroGLYCERIN (NITROSTAT) 0.4 MG SL tablet Place 1 tablet (0.4 mg total) under the tongue every 5 (five) minutes as needed for chest pain. 10 tablet 1  . oxymetazoline (AFRIN) 0.05 % nasal spray Place 1 spray into both nostrils daily as needed for congestion.    . Probiotic Product (ALIGN PO) Take 1 tablet by mouth daily. Take daily     . RHOPRESSA 0.02 % SOLN Place 1 drop into both eyes at bedtime.     . valACYclovir (VALTREX) 1000 MG tablet Take 1,000 mg by mouth daily as needed (outbreak).      No current facility-administered medications for this visit.    Allergies:   Brimonidine tartrate-timolol    Social History:  The patient  reports that he quit smoking about 54 years ago. His smoking use included cigarettes. He has never  used smokeless tobacco. He reports current alcohol use. He reports that he does not use drugs.   Family History:  The patient's family history includes Breast cancer in his sister; Colon cancer in his paternal uncle and another family member; Diabetes in his sister; High blood pressure in his sister; Mitral valve prolapse in an other family member; Stroke in his father, mother, and sister.    ROS:  Please see the history of present illness.  Otherwise, review of systems are positive for none.   All other systems are reviewed and negative.    PHYSICAL EXAM: VS:  BP 114/76   Pulse (!) 54   Ht 6\' 2"  (1.88 m)   Wt 192 lb (87.1 kg)   SpO2 96%   BMI 24.65 kg/m  , BMI Body mass index is 24.65 kg/m.   Affect appropriate Healthy:  appears stated age 33: normal Neck supple with no adenopathy JVP normal no bruits no thyromegaly Lungs clear with no wheezing and good diaphragmatic motion Heart:  S1/S2 no murmur, no rub, gallop or click PMI normal Abdomen: benighn, BS positve, no tenderness, no AAA no bruit.  No HSM or HJR Distal pulses intact with no bruits No edema Neuro non-focal Skin warm and dry No muscular weakness  E KG:   SB rate 52 poor R wave progression 08/26/19  03/10/20 SR rate 54 normal    Recent Labs: 09/26/2019: ALT 20; BUN 13; Creatinine, Ser 0.87; Hemoglobin 13.2; Platelets 197; Potassium 3.5; Sodium 141    Lipid Panel    Component Value Date/Time   CHOL 123 08/18/2019 0550   TRIG 54 08/18/2019 0550   HDL 37 (L) 08/18/2019 0550   CHOLHDL 3.3 08/18/2019 0550   VLDL 11 08/18/2019 0550   LDLCALC 75 08/18/2019 0550      Wt Readings from Last 3 Encounters:  03/10/20 192 lb (87.1 kg)  09/22/19 190 lb 3.2 oz (86.3 kg)  08/28/19 189 lb (85.7 kg)     Other studies Reviewed: Additional studies/ records that were reviewed today include:   Myoview stress test 03/26/2018:   Nuclear stress EF: 71%.  The study is normal.  This is a low risk study.  The left  ventricular ejection fraction is hyperdynamic (>65%).  There was no ST segment deviation noted during stress. BP did drop in Stage 3 but likely erroneous as he had a hypertensive BP response in recovery.  Echocardiogram 08/18/2019:   1. The left ventricle has hyperdynamic systolic  function, with an ejection fraction of >65%. The cavity size was normal. There is moderately increased left ventricular wall thickness. Left ventricular diastolic Doppler parameters are consistent with  impaired relaxation.  2. The right ventricle has normal systolic function. The cavity was normal. There is no increase in right ventricular wall thickness.  3. Mild calcification of the mitral valve leaflet. There is mild mitral annular calcification present. No evidence of mitral valve stenosis. No significant mitral regurgitation.  4. The aortic valve is tricuspid. Mild calcification of the aortic valve. No stenosis of the aortic valve.  5. The inferior vena cava was dilated in size with <50% respiratory variability. No complete TR doppler jet so unable to estimate PA systolic pressure.  6. The aortic root is normal in size and structure.  7. Trivial pericardial effusion is present.  ASSESSMENT AND PLAN:  1. Atypical chest pain: - with normal myovue 03/26/18 on plavix and statin    2. HTN: - Well controlled.  Continue current medications and low sodium Dash type diet.     3. Hx of palpitations: -negative monitor 10/27/19 on cardizem   4. HLD: on statin last LDL 08/18/19 was 75    -  6.  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 There was aneurysmal dilation of the right common iliac and moderate aorto bi-iliac atherosclerosis  [facility] was consulted who recommended follow up CT angiogram of the abdomen and pelvis in 6 months  Will order and arrange f/u with [facility]  Called their office personally to arrange   7. TIA:  F/u  neurology no PaF on monitor no large vessel ICA stenosis on CTA  Continue plavix MRI was negative I think he will get through this without need for SSRI. Has good family and neighborhood support   8. Grief:  Long discussion with him about loosing his wife and the care that she received. They were married 48 years and it has been very hard on him.     Current medicines are reviewed at length with the patient today.  The patient does not have concerns regarding medicines.  The following changes have been made:  no change  Labs/ tests ordered today include:  CTA abdomen / pelvis  Lipid and Liver    No orders of the defined types were placed in this encounter.   Disposition:   FU  In 6 months   F/U with [facility] after CTA   Signed, [name], MD  03/10/2020 10:12 AM    [facility] [address], [place], [place]  [address] Phone: [phone]; Fax: [phone]

## 2020-03-02 DIAGNOSIS — H40003 Preglaucoma, unspecified, bilateral: Secondary | ICD-10-CM | POA: Diagnosis not present

## 2020-03-02 DIAGNOSIS — H353220 Exudative age-related macular degeneration, left eye, stage unspecified: Secondary | ICD-10-CM | POA: Diagnosis not present

## 2020-03-02 DIAGNOSIS — H353110 Nonexudative age-related macular degeneration, right eye, stage unspecified: Secondary | ICD-10-CM | POA: Diagnosis not present

## 2020-03-02 DIAGNOSIS — Z961 Presence of intraocular lens: Secondary | ICD-10-CM | POA: Diagnosis not present

## 2020-03-03 DIAGNOSIS — H353111 Nonexudative age-related macular degeneration, right eye, early dry stage: Secondary | ICD-10-CM | POA: Diagnosis not present

## 2020-03-03 DIAGNOSIS — H3581 Retinal edema: Secondary | ICD-10-CM | POA: Diagnosis not present

## 2020-03-03 DIAGNOSIS — H35371 Puckering of macula, right eye: Secondary | ICD-10-CM | POA: Diagnosis not present

## 2020-03-03 DIAGNOSIS — H353222 Exudative age-related macular degeneration, left eye, with inactive choroidal neovascularization: Secondary | ICD-10-CM | POA: Diagnosis not present

## 2020-03-09 DIAGNOSIS — H401132 Primary open-angle glaucoma, bilateral, moderate stage: Secondary | ICD-10-CM | POA: Diagnosis not present

## 2020-03-10 ENCOUNTER — Other Ambulatory Visit: Payer: Self-pay

## 2020-03-10 ENCOUNTER — Encounter: Payer: Self-pay | Admitting: Cardiovascular Disease

## 2020-03-10 ENCOUNTER — Ambulatory Visit: Payer: PPO | Admitting: Cardiovascular Disease

## 2020-03-10 VITALS — BP 114/76 | HR 54 | Ht 74.0 in | Wt 192.0 lb

## 2020-03-10 DIAGNOSIS — I723 Aneurysm of iliac artery: Secondary | ICD-10-CM

## 2020-03-10 DIAGNOSIS — R002 Palpitations: Secondary | ICD-10-CM

## 2020-03-10 DIAGNOSIS — I4891 Unspecified atrial fibrillation: Secondary | ICD-10-CM

## 2020-03-10 LAB — BASIC METABOLIC PANEL
BUN/Creatinine Ratio: 24 (ref 10–24)
BUN: 21 mg/dL (ref 8–27)
CO2: 25 mmol/L (ref 20–29)
Calcium: 9.4 mg/dL (ref 8.6–10.2)
Chloride: 99 mmol/L (ref 96–106)
Creatinine, Ser: 0.89 mg/dL (ref 0.76–1.27)
GFR calc Af Amer: 94 mL/min/{1.73_m2} (ref >59)
GFR calc non Af Amer: 81 mL/min/{1.73_m2} (ref >59)
Glucose: 108 mg/dL — ABNORMAL HIGH (ref 65–99)
Potassium: 4.1 mmol/L (ref 3.5–5.2)
Sodium: 138 mmol/L (ref 134–144)

## 2020-03-10 NOTE — Patient Instructions (Addendum)
Medication Instructions:  none *If you need a refill on your cardiac medications before your next appointment, please call your pharmacy*   Lab Work:  TODAY  BMET If you have labs (blood work) drawn today and your tests are completely normal, you will receive your results only by: Marland Kitchen MyChart Message (if you have MyChart) OR . A paper copy in the mail If you have any lab test that is abnormal or we need to change your treatment, we will call you to review the results.   Testing/Procedures:  SOMEONE WILL CALL TO SCHEDULE  CTA of ABDOMEN/PELVIS  Follow-Up: SOMEONE WILL CALL TO SCHEDULE WITH Dr Gae Gallop ([facility])    At [facility], you and your health needs are our priority.  As part of our continuing mission to provide you with exceptional heart care, we have created designated Provider Care Teams.  These Care Teams include your primary Cardiologist (physician) and Advanced Practice Providers (APPs -  Physician Assistants and Nurse Practitioners) who all work together to provide you with the care you need, when you need it.    Your next appointment:   6 MONTHS  The format for your next appointment:   Either In Person or Virtual  Provider:   Dr Johnsie Cancel   Other Instructions

## 2020-03-13 ENCOUNTER — Other Ambulatory Visit: Payer: Self-pay | Admitting: Gastroenterology

## 2020-03-17 ENCOUNTER — Other Ambulatory Visit: Payer: Self-pay

## 2020-03-17 ENCOUNTER — Ambulatory Visit (INDEPENDENT_AMBULATORY_CARE_PROVIDER_SITE_OTHER)
Admission: RE | Admit: 2020-03-17 | Discharge: 2020-03-17 | Disposition: A | Discharge status: Home / Self Care | Payer: PPO | Source: Ambulatory Visit | Attending: Cardiovascular Disease | Admitting: Cardiovascular Disease

## 2020-03-17 DIAGNOSIS — I4891 Unspecified atrial fibrillation: Secondary | ICD-10-CM

## 2020-03-17 DIAGNOSIS — R002 Palpitations: Secondary | ICD-10-CM

## 2020-03-17 DIAGNOSIS — I723 Aneurysm of iliac artery: Secondary | ICD-10-CM | POA: Diagnosis not present

## 2020-03-17 MED ORDER — IOHEXOL 350 MG/ML SOLN
100.0000 mL | Freq: Once | INTRAVENOUS | Status: AC | PRN
  Administered 2020-03-17: 10:00:00 100 mL via INTRAVENOUS

## 2020-03-19 ENCOUNTER — Telehealth: Payer: Self-pay | Admitting: Cardiology

## 2020-03-19 NOTE — Telephone Encounter (Signed)
Called patient back with results. Patient verbalized understanding.

## 2020-03-19 NOTE — Telephone Encounter (Signed)
Dr.Nishan patient.   Thank you!

## 2020-03-19 NOTE — Telephone Encounter (Signed)
   Pt is returning call regarding CT result  Please call

## 2020-03-24 DIAGNOSIS — Z87442 Personal history of urinary calculi: Secondary | ICD-10-CM | POA: Diagnosis not present

## 2020-03-24 DIAGNOSIS — N401 Enlarged prostate with lower urinary tract symptoms: Secondary | ICD-10-CM | POA: Diagnosis not present

## 2020-03-24 DIAGNOSIS — R351 Nocturia: Secondary | ICD-10-CM | POA: Diagnosis not present

## 2020-03-24 DIAGNOSIS — N5201 Erectile dysfunction due to arterial insufficiency: Secondary | ICD-10-CM | POA: Diagnosis not present

## 2020-03-31 ENCOUNTER — Encounter: Payer: Self-pay | Admitting: Physician Assistant

## 2020-03-31 ENCOUNTER — Ambulatory Visit: Payer: PPO | Admitting: Physician Assistant

## 2020-03-31 VITALS — BP 100/62 | HR 57 | Temp 97.8°F | Ht 73.0 in | Wt 191.0 lb

## 2020-03-31 DIAGNOSIS — K219 Gastro-esophageal reflux disease without esophagitis: Secondary | ICD-10-CM | POA: Diagnosis not present

## 2020-03-31 DIAGNOSIS — H353111 Nonexudative age-related macular degeneration, right eye, early dry stage: Secondary | ICD-10-CM | POA: Diagnosis not present

## 2020-03-31 DIAGNOSIS — H35351 Cystoid macular degeneration, right eye: Secondary | ICD-10-CM | POA: Diagnosis not present

## 2020-03-31 DIAGNOSIS — H353223 Exudative age-related macular degeneration, left eye, with inactive scar: Secondary | ICD-10-CM | POA: Diagnosis not present

## 2020-03-31 DIAGNOSIS — H35371 Puckering of macula, right eye: Secondary | ICD-10-CM | POA: Diagnosis not present

## 2020-03-31 MED ORDER — ESOMEPRAZOLE MAGNESIUM 40 MG PO CPDR: DELAYED_RELEASE_CAPSULE | ORAL | 8 refills | Status: DC

## 2020-03-31 NOTE — Patient Instructions (Signed)
If you are age 80 or older, your body mass index should be between 23-30. Your Body mass index is 25.2 kg/m. If this is out of the aforementioned range listed, please consider follow up with your Primary Care Provider.  If you are age 20 or younger, your body mass index should be between 19-25. Your Body mass index is 25.2 kg/m. If this is out of the aformentioned range listed, please consider follow up with your Primary Care Provider.    We have sent the following medications to your pharmacy for you to pick up at your convenience: Nexium  Use Nexium- 20mg  twice daily regularly, then go to 40mg - twice daily with acid reflux flares.    Thank you for choosing me and [facility].  Amy Esterwood-PA

## 2020-03-31 NOTE — Progress Notes (Signed)
Subjective:    Patient ID: Jonathan Lane, male    DOB: 05/03/1941, 79 y.o.   MRN: [id number]  HPI Jonathan Lane is a pleasant 79 year old white male, established with Jonathan Lane, who comes in today to discuss medication management for GERD. Patient has history of chronic GERD.  He last had EGD in 2019 with finding of a small hiatal hernia and otherwise negative exam. Last colonoscopy 2015 with mild diverticulosis in the left colon, no polyps. He has history of hypertension, atrial fibrillation, sleep apnea, prior TIA and iliac aneurysm. He is currently using Nexium 20 mg OTC on a daily basis which he says generally controls his symptoms fairly well.  He will have occasional bouts of "flares".  He says this usually starts with some discomfort in his right lower quadrant and then he starts having a sensation of a sore throat.  He would then start taking Nexium 40 mg p.o. twice daily for several days, symptoms resolve and he eventually goes back to OTC Nexium at 20 mg daily.  He says this may only happen a few times per year.  He feels this is often aggravated by dietary indiscretion though he tries to pay attention to his diet from a reflux standpoint. No complaints of dysphagia or odynophagia. He says his constipation symptoms of been doing much better over the past several months since he started drinking about 80 ounces of water per day and eating fiber 1 cereal every morning.  Review of Systems Pertinent positive and negative review of systems were noted in the above HPI section.  All other review of systems was otherwise negative.  Outpatient Encounter Medications as of 03/31/2020  Medication Sig  . Apoaequorin (PREVAGEN PO) Take 1 capsule by mouth daily. Focus Health and memory.   Marland Kitchen atorvastatin (LIPITOR) 20 MG tablet Take 1 tablet (20 mg total) by mouth daily.  . clopidogrel (PLAVIX) 75 MG tablet Take 1 tablet (75 mg total) by mouth daily.  . Coenzyme Q10 (CO Q 10 PO) Take 1 capsule by mouth  daily.   . COSOPT PF 22.3-6.8 MG/ML SOLN Place 1 drop into both eyes 2 (two) times daily.   . Cranberry 400 MG CAPS Take 1 capsule by mouth daily.  Marland Kitchen diltiazem (DILACOR XR) 240 MG 24 hr capsule Take 1 capsule (240 mg total) by mouth daily.  Marland Kitchen docusate sodium (COLACE) 100 MG capsule Take 200 mg by mouth daily.  Marland Kitchen esomeprazole (NEXIUM) 40 MG capsule TAKE 1 CAPSULE TWICE DAILY BEFORE MEALS  . fish oil-omega-3 fatty acids 1000 MG capsule Take 1 g by mouth 2 (two) times daily.   . Glucosamine-Chondroitin (MOVE FREE PO) Take 1 tablet by mouth daily.  Marland Kitchen losartan-hydrochlorothiazide (HYZAAR) 100-25 MG tablet Take 1 tablet by mouth daily.  . Misc Natural Products (PROSTATE HEALTH) CAPS Take 1 capsule by mouth daily.  . multivitamin (THERAGRAN) per tablet Take 1 tablet by mouth daily.  . nitroGLYCERIN (NITROSTAT) 0.4 MG SL tablet Place 1 tablet (0.4 mg total) under the tongue every 5 (five) minutes as needed for chest pain.  Marland Kitchen oxymetazoline (AFRIN) 0.05 % nasal spray Place 1 spray into both nostrils daily as needed for congestion.  . Probiotic Product (ALIGN PO) Take 1 tablet by mouth daily. Take daily   . RHOPRESSA 0.02 % SOLN Place 1 drop into both eyes at bedtime.   . valACYclovir (VALTREX) 1000 MG tablet Take 1,000 mg by mouth daily as needed (outbreak).   . [DISCONTINUED] esomeprazole (NEXIUM) 40 MG capsule  TAKE 1 CAPSULE TWICE DAILY BEFORE MEALS   No facility-administered encounter medications on file as of 03/31/2020.   Allergies  Allergen Reactions  . Brimonidine Tartrate-Timolol Itching and Rash    Itching eyes Itching eyes, rash Itching eyes, rash Itching eyes, rash   Patient Active Problem List   Diagnosis Date Noted  . TIA (transient ischemic attack) 08/25/2019  . Hypokalemia 08/18/2019  . Obstructive sleep apnea 01/22/2019  . Gastroesophageal reflux disease 05/22/2018  . Sore throat 05/22/2018  . RLQ abdominal pain 03/15/2018  . Chest pain 01/29/2018  . OSA on CPAP 10/11/2015    . 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 Snoring   . ATRIAL FIBRILLATION 08/31/2009  . CARDIAC MURMUR 08/31/2009  . Elevated lipids 06/02/2009  . Essential hypertension 06/02/2009  . DIZZINESS 06/02/2009  . PALPITATIONS 06/02/2009   Social History   Socioeconomic History  . Marital status: Married    Spouse name: Jonathan Lane  . Number of children: 1  . Years of education: Masters  . Highest education level: Not on file  Occupational History    Employer: RETIRED  Tobacco Use  . Smoking status: Former Smoker    Types: Cigarettes    Quit date: 12/18/1965    Years since quitting: 54.3  . Smokeless tobacco: Never Used  Substance and Sexual Activity  . Alcohol use: Yes    Comment: 5-7 drinks per week  . Drug use: No  . Sexual activity: Not on file  Other Topics Concern  . Not on file  Social History Narrative   Patient is married Jonathan Lane).   Patient is retired.   Patient has one adult child.   Patient does not drink any caffeine.   Patient is right-handed.   Patient has a Scientist, water quality.            Social Determinants of Health   Financial Resource Strain:   . Difficulty of Paying Living Expenses:   Food Insecurity:   . Worried About Charity fundraiser in the Last Year:   . Arboriculturist in the Last Year:   Transportation Needs:   . Film/video editor (Medical):   Marland Kitchen Lack of Transportation (Non-Medical):   Physical Activity:   . Days of Exercise per Week:   . Minutes of Exercise per Session:   Stress:   . Feeling of Stress :   Social Connections:   . Frequency of Communication with Friends and Family:   . Frequency of Social Gatherings with Friends and Family:   . Attends Religious Services:   . Active Member of Clubs or Organizations:   . Attends Archivist Meetings:   Marland Kitchen Marital Status:   Intimate Partner Violence:   . Fear of Current or Ex-Partner:   . Emotionally Abused:   Marland Kitchen Physically Abused:   .  Sexually Abused:     Jonathan Lane family history includes Breast cancer in his sister; Colon cancer in his paternal uncle and another family member; Diabetes in his sister; High blood pressure in his sister; Mitral valve prolapse in an other family member; Stroke in his father, mother, and sister.      Objective:    Vitals:   03/31/20 0905  BP: 100/62  Pulse: (!) 57  Temp: 97.8 F (36.6 C)    Physical Exam Well-developed well-nourished older white male in no acute distress.  Height, Weight, 191 BMI 25.2  HEENT; nontraumatic normocephalic, EOMI, PER R LA, sclera  anicteric.  Extremities; no clubbing cyanosis or edema skin warm and dry Neuro/Psych; alert and oriented x4, grossly nonfocal mood and affect appropriate       Assessment & Lane:   #39 79 year old white male with chronic GERD, generally with fairly good control on Nexium 20 mg p.o. every morning though he does have occasional exacerbations requiring short courses of high-dose Nexium 40 mg p.o. twice daily.  #2 chronic constipation-stable improved 3.  Colon cancer screening-up-to-date with colonoscopy 2015, no polyps mild diverticulosis 4.  Prior history of TIA 5.  Sleep apnea 6.  History of atrial fibrillation 7.  Hypertension  Lane; Continue antireflux regimen, we discussed n.p.o. for 2 to 3 hours prior to bedtime and considering elevating the head of the bed 45 degrees. Increase OTC Nexium 20 mg to 1 p.o. AC breakfast and 1 p.o. AC dinner on a regular basis.  This may help avert his intermittent exacerbations. I have refilled Nexium 40 mg p.o. twice daily which he is requesting and using on a periodic basis. He will follow-up with Jonathan Lane or myself as needed.  Jonathan Lane S [place] PA-C 03/31/2020   Cc: Glenis Smoker, *

## 2020-03-31 NOTE — Progress Notes (Signed)
Reviewed and agree with management plan.  Meldrick Buttery T. Alecea Trego, MD FACG [facility]  

## 2020-04-06 DIAGNOSIS — L720 Epidermal cyst: Secondary | ICD-10-CM | POA: Diagnosis not present

## 2020-04-06 DIAGNOSIS — Z86018 Personal history of other benign neoplasm: Secondary | ICD-10-CM | POA: Diagnosis not present

## 2020-04-06 DIAGNOSIS — L820 Inflamed seborrheic keratosis: Secondary | ICD-10-CM | POA: Diagnosis not present

## 2020-04-06 DIAGNOSIS — D485 Neoplasm of uncertain behavior of skin: Secondary | ICD-10-CM | POA: Diagnosis not present

## 2020-04-06 DIAGNOSIS — D2262 Melanocytic nevi of left upper limb, including shoulder: Secondary | ICD-10-CM | POA: Diagnosis not present

## 2020-04-06 DIAGNOSIS — D225 Melanocytic nevi of trunk: Secondary | ICD-10-CM | POA: Diagnosis not present

## 2020-04-06 DIAGNOSIS — Z85828 Personal history of other malignant neoplasm of skin: Secondary | ICD-10-CM | POA: Diagnosis not present

## 2020-04-06 DIAGNOSIS — L821 Other seborrheic keratosis: Secondary | ICD-10-CM | POA: Diagnosis not present

## 2020-04-06 DIAGNOSIS — L578 Other skin changes due to chronic exposure to nonionizing radiation: Secondary | ICD-10-CM | POA: Diagnosis not present

## 2020-04-13 ENCOUNTER — Other Ambulatory Visit: Payer: Self-pay | Admitting: Cardiovascular Disease

## 2020-04-13 MED ORDER — DILTIAZEM HCL ER 240 MG PO CP24: 240.0000 mg | ORAL_CAPSULE | Freq: Every day | ORAL | 2 refills | Status: DC

## 2020-04-14 ENCOUNTER — Encounter: Payer: Self-pay | Admitting: Vascular Surgery

## 2020-04-14 ENCOUNTER — Other Ambulatory Visit: Payer: Self-pay

## 2020-04-14 ENCOUNTER — Ambulatory Visit (INDEPENDENT_AMBULATORY_CARE_PROVIDER_SITE_OTHER): Payer: PPO | Admitting: Vascular Surgery

## 2020-04-14 VITALS — BP 133/70 | HR 53 | Temp 98.1°F | Resp 20 | Ht 73.0 in | Wt 193.0 lb

## 2020-04-14 DIAGNOSIS — I723 Aneurysm of iliac artery: Secondary | ICD-10-CM | POA: Diagnosis not present

## 2020-04-14 NOTE — Progress Notes (Signed)
REASON FOR CONSULT:    Right common iliac artery aneurysm.  The consult is requested by Dr. [name].  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  He does have some ulceration here but this all likewise is stable.  I explained that we would not consider elective repair unless this reached 3.5 cm or larger in greatest diameter.  Fortunately he is not a smoker.  His blood pressures under good control.  He is reasonably thin and for this reason I think we can follow this with ultrasound and therefore I have ordered a follow-up ultrasound in 1 year.  I will see him back at that time.  He knows to call sooner if he has problems.   Deitra Mayo, MD Office: [phone]   HPI:   Jonathan Lane is a pleasant 79 y.o. 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  He was asymptomatic.  Excellent pulses distally I simply recommended a follow-up CT angiogram in 6 months.  He comes in for that visit.  Since I saw him last he has had no significant abdominal pain or back pain.  He walks a mile a day and denies any claudication.  He denies any rest pain.  He quit smoking in 1967.  His only real risk factor for peripheral vascular disease is hypertension.  Patient has a previous history of a TIA and has been worked up by neurology and had no large vessel extracranial carotid stenosis.  Patient has been on Plavix.  He has no family history of aneurysmal disease.  Past Medical History:  Diagnosis Date  . Atrial fibrillation ([place])   . Bradycardia   . Bradycardia   . Cardiac murmur   . Dizziness   . GERD (gastroesophageal reflux disease)   . Glaucoma   . Heart palpitations   . Hyperlipidemia   . Hypertension   . Numbness 08/26/2019   LEFT FACE  . Skin abnormalities    pre cancerous lesion R hand  . Sleep apnea    uses cpap  .  Snoring     Family History  Problem Relation Age of Onset  . Stroke Father   . Stroke Mother   . Stroke Sister   . Breast cancer Sister   . High blood pressure Sister   . Diabetes Sister   . Colon cancer Other        Uncle  . Colon cancer Paternal Uncle   . Mitral valve prolapse Other        sugery 09-27-2017  . Rectal cancer Neg Hx   . Stomach cancer Neg Hx     SOCIAL HISTORY: Social History   Socioeconomic History  . Marital status: Married    Spouse name: [name]  . Number of children: 1  . Years of education: Masters  . Highest education level: Not on file  Occupational History    Employer: RETIRED  Tobacco Use  . Smoking status: Former Smoker    Types: Cigarettes    Quit date: 12/18/1965    Years since quitting: 54.3  . Smokeless tobacco: Never Used  Substance and Sexual Activity  . Alcohol use: Yes    Comment: 5-7 drinks per week  . Drug use: No  . Sexual activity: Not on file  Other Topics Concern  . Not on file  Social History Narrative  Patient is married Charity fundraiser).   Patient is retired.   Patient has one adult child.   Patient does not drink any caffeine.   Patient is right-handed.   Patient has a Scientist, water quality.            Social Determinants of Health   Financial Resource Strain:   . Difficulty of Paying Living Expenses:   Food Insecurity:   . Worried About Charity fundraiser in the Last Year:   . Arboriculturist in the Last Year:   Transportation Needs:   . Film/video editor (Medical):   Marland Kitchen Lack of Transportation (Non-Medical):   Physical Activity:   . Days of Exercise per Week:   . Minutes of Exercise per Session:   Stress:   . Feeling of Stress :   Social Connections:   . Frequency of Communication with Friends and Family:   . Frequency of Social Gatherings with Friends and Family:   . Attends Religious Services:   . Active Member of Clubs or Organizations:   . Attends Archivist Meetings:   Marland Kitchen Marital Status:     Intimate Partner Violence:   . Fear of Current or Ex-Partner:   . Emotionally Abused:   Marland Kitchen Physically Abused:   . Sexually Abused:     Allergies  Allergen Reactions  . Brimonidine Tartrate-Timolol Itching and Rash    Itching eyes Itching eyes, rash Itching eyes, rash Itching eyes, rash    Current Outpatient Medications  Medication Sig Dispense Refill  . Apoaequorin (PREVAGEN PO) Take 1 capsule by mouth daily. Focus Health and memory.     Marland Kitchen atorvastatin (LIPITOR) 20 MG tablet Take 1 tablet (20 mg total) by mouth daily. 30 tablet 0  . clopidogrel (PLAVIX) 75 MG tablet Take 1 tablet (75 mg total) by mouth daily. 30 tablet 0  . Coenzyme Q10 (CO Q 10 PO) Take 1 capsule by mouth daily.     . COSOPT PF 22.3-6.8 MG/ML SOLN Place 1 drop into both eyes 2 (two) times daily.     . Cranberry 400 MG CAPS Take 1 capsule by mouth daily.    Marland Kitchen diltiazem (DILACOR XR) 240 MG 24 hr capsule Take 1 capsule (240 mg total) by mouth daily. 90 capsule 2  . docusate sodium (COLACE) 100 MG capsule Take 200 mg by mouth daily.    Marland Kitchen esomeprazole (NEXIUM) 40 MG capsule TAKE 1 CAPSULE TWICE DAILY BEFORE MEALS 60 capsule 8  . fish oil-omega-3 fatty acids 1000 MG capsule Take 1 g by mouth 2 (two) times daily.     . Glucosamine-Chondroitin (MOVE FREE PO) Take 1 tablet by mouth daily.    Marland Kitchen losartan-hydrochlorothiazide (HYZAAR) 100-25 MG tablet TAKE 1 TABLET ONCE DAILY. 90 tablet 2  . Misc Natural Products (PROSTATE HEALTH) CAPS Take 1 capsule by mouth daily.    . multivitamin (THERAGRAN) per tablet Take 1 tablet by mouth daily.    . nitroGLYCERIN (NITROSTAT) 0.4 MG SL tablet Place 1 tablet (0.4 mg total) under the tongue every 5 (five) minutes as needed for chest pain. 10 tablet 1  . oxymetazoline (AFRIN) 0.05 % nasal spray Place 1 spray into both nostrils daily as needed for congestion.    . Probiotic Product (ALIGN PO) Take 1 tablet by mouth daily. Take daily     . RHOPRESSA 0.02 % SOLN Place 1 drop into both eyes  at bedtime.     . valACYclovir (VALTREX) 1000 MG tablet Take 1,000  mg by mouth daily as needed (outbreak).      No current facility-administered medications for this visit.    REVIEW OF SYSTEMS:  [X]  denotes positive finding, [ ]  denotes negative finding Cardiac  Comments:  Chest pain or chest pressure:    Shortness of breath upon exertion:    Short of breath when lying flat:    Irregular heart rhythm:        Vascular    Pain in calf, thigh, or hip brought on by ambulation:    Pain in feet at night that wakes you up from your sleep:     Blood clot in your veins:    Leg swelling:         Pulmonary    Oxygen at home:    Productive cough:     Wheezing:         Neurologic    Sudden weakness in arms or legs:     Sudden numbness in arms or legs:     Sudden onset of difficulty speaking or slurred speech:    Temporary loss of vision in one eye:     Problems with dizziness:         Gastrointestinal    Blood in stool:     Vomited blood:         Genitourinary    Burning when urinating:     Blood in urine:        Psychiatric    Major depression:         Hematologic    Bleeding problems:    Problems with blood clotting too easily:        Skin    Rashes or ulcers:        Constitutional    Fever or chills:     PHYSICAL EXAM:   Vitals:   04/14/20 1348  BP: 133/70  Pulse: (!) 53  Resp: 20  Temp: 98.1 F (36.7 C)  SpO2: 95%  Weight: 193 lb (87.5 kg)  Height: 6\' 1"  (1.854 m)    GENERAL: The patient is a well-nourished male, in no acute distress. The vital signs are documented above. CARDIAC: There is a regular rate and rhythm.  VASCULAR: I do not detect carotid bruits. He has palpable femoral, popliteal, and pedal pulses bilaterally. PULMONARY: There is good air exchange bilaterally without wheezing or rales. ABDOMEN: Soft and non-tender with normal pitched bowel sounds.  I do not palpate an abdominal aortic aneurysm. MUSCULOSKELETAL: There are no major  deformities or cyanosis. NEUROLOGIC: No focal weakness or paresthesias are detected. SKIN: There are no ulcers or rashes noted. PSYCHIATRIC: The patient has a normal affect.  DATA:    CT ANGIO ABDOMEN PELVIS: I reviewed the images of the CT angio of the abdomen and pelvis that was done on 03/17/2020.  The patient had a known right common iliac artery aneurysm and this was a follow-up study.  LABS: I reviewed his labs from 03/10/2020.  Creatinine is 0.89.  GFR was 81.

## 2020-04-15 ENCOUNTER — Other Ambulatory Visit: Payer: Self-pay | Admitting: *Deleted

## 2020-04-15 DIAGNOSIS — I723 Aneurysm of iliac artery: Secondary | ICD-10-CM

## 2020-05-05 ENCOUNTER — Other Ambulatory Visit: Payer: Self-pay

## 2020-05-05 ENCOUNTER — Encounter: Payer: Self-pay | Admitting: Adult Health

## 2020-05-05 ENCOUNTER — Ambulatory Visit: Payer: PPO | Admitting: Adult Health

## 2020-05-05 VITALS — BP 123/68 | HR 54 | Ht 74.0 in | Wt 190.0 lb

## 2020-05-05 DIAGNOSIS — Z9989 Dependence on other enabling machines and devices: Secondary | ICD-10-CM

## 2020-05-05 DIAGNOSIS — G4733 Obstructive sleep apnea (adult) (pediatric): Secondary | ICD-10-CM | POA: Diagnosis not present

## 2020-05-05 NOTE — Patient Instructions (Signed)

## 2020-05-05 NOTE — Progress Notes (Signed)
PATIENT: Jonathan Lane DOB: March 11, 1941  REASON FOR VISIT: follow up HISTORY FROM: patient  HISTORY OF PRESENT ILLNESS: Today 05/05/20:  Jonathan Lane is a 79 year old male with a history of obstructive sleep apnea on CPAP.  He returns today for follow-up.  His download indicates that he uses machine nightly for compliance of 100%.  He uses machine greater than 4 hours each night.  On average he uses his machine 7 hours and 48 minutes.  His residual AHI is 1.4 on 14 cm of water with EPR 3.  Leak in the 95th percentile is 45.6 L/min.  He reports that his straps give out before he is able to get new ones.  This causes his leak.  He returns today for an evaluation.  HISTORY 05/08/19:  Jonathan Lane is a 79 year old male with a history of obstructive sleep apnea on CPAP.  He returns today for a virtual visit.  His download indicates that he uses machine nightly for compliance of 100%.  He uses machine greater than 4 hours each night.  On average he uses his machine 7 hours and 42 minutes.  His residual AHI is 1.1 on 4 to 10 cm of water with EPR 3.  He does have a leak in the 95th percentile at 45.6 L/min.  He states he is currently wearing the nasal pillows.  He states he is probably due for new supplies.  He also reports that he does feel the mask leaking.  He joins me today for a virtual visit  REVIEW OF SYSTEMS: Out of a complete 14 system review of symptoms, the patient complains only of the following symptoms, and all other reviewed systems are negative.  ESS 2 ALLERGIES: Allergies  Allergen Reactions  . Brimonidine Tartrate-Timolol Itching and Rash    Itching eyes Itching eyes, rash Itching eyes, rash Itching eyes, rash    HOME MEDICATIONS: Outpatient Medications Prior to Visit  Medication Sig Dispense Refill  . Apoaequorin (PREVAGEN PO) Take 1 capsule by mouth daily. Focus Health and memory.     Marland Kitchen atorvastatin (LIPITOR) 20 MG tablet Take 1 tablet (20 mg total) by mouth  daily. 30 tablet 0  . clopidogrel (PLAVIX) 75 MG tablet Take 1 tablet (75 mg total) by mouth daily. 30 tablet 0  . Coenzyme Q10 (CO Q 10 PO) Take 1 capsule by mouth daily.     . COSOPT PF 22.3-6.8 MG/ML SOLN Place 1 drop into both eyes 2 (two) times daily.     . Cranberry 400 MG CAPS Take 1 capsule by mouth daily.    Marland Kitchen diltiazem (DILACOR XR) 240 MG 24 hr capsule Take 1 capsule (240 mg total) by mouth daily. 90 capsule 2  . docusate sodium (COLACE) 100 MG capsule Take 200 mg by mouth daily.    Marland Kitchen esomeprazole (NEXIUM) 40 MG capsule TAKE 1 CAPSULE TWICE DAILY BEFORE MEALS 60 capsule 8  . fish oil-omega-3 fatty acids 1000 MG capsule Take 1 g by mouth 2 (two) times daily.     . Glucosamine-Chondroitin (MOVE FREE PO) Take 1 tablet by mouth daily.    Marland Kitchen losartan-hydrochlorothiazide (HYZAAR) 100-25 MG tablet TAKE 1 TABLET ONCE DAILY. 90 tablet 2  . Misc Natural Products (PROSTATE HEALTH) CAPS Take 1 capsule by mouth daily.    . multivitamin (THERAGRAN) per tablet Take 1 tablet by mouth daily.    . nitroGLYCERIN (NITROSTAT) 0.4 MG SL tablet Place 1 tablet (0.4 mg total) under the tongue every 5 (five)  minutes as needed for chest pain. 10 tablet 1  . oxymetazoline (AFRIN) 0.05 % nasal spray Place 1 spray into both nostrils daily as needed for congestion.    . Probiotic Product (ALIGN PO) Take 1 tablet by mouth daily. Take daily     . RHOPRESSA 0.02 % SOLN Place 1 drop into both eyes at bedtime.     . valACYclovir (VALTREX) 1000 MG tablet Take 1,000 mg by mouth daily as needed (outbreak).      No facility-administered medications prior to visit.    PAST MEDICAL HISTORY: Past Medical History:  Diagnosis Date  . Atrial fibrillation ([place])   . Bradycardia   . Bradycardia   . Cardiac murmur   . Dizziness   . GERD (gastroesophageal reflux disease)   . Glaucoma   . Heart palpitations   . Hyperlipidemia   . Hypertension   . Numbness 08/26/2019   LEFT FACE  . Skin abnormalities    pre cancerous  lesion R hand  . Sleep apnea    uses cpap  . Snoring     PAST SURGICAL HISTORY: Past Surgical History:  Procedure Laterality Date  . APPENDECTOMY  1965  . COLONOSCOPY    . POLYPECTOMY    . TONSILLECTOMY     as a child    FAMILY HISTORY: Family History  Problem Relation Age of Onset  . Stroke Father   . Stroke Mother   . Stroke Sister   . Breast cancer Sister   . High blood pressure Sister   . Diabetes Sister   . Colon cancer Other        Uncle  . Colon cancer Paternal Uncle   . Mitral valve prolapse Other        sugery 09-27-2017  . Rectal cancer Neg Hx   . Stomach cancer Neg Hx     SOCIAL HISTORY: Social History   Socioeconomic History  . Marital status: Married    Spouse name: Jonathan Lane  . Number of children: 1  . Years of education: Masters  . Highest education level: Not on file  Occupational History    Employer: RETIRED  Tobacco Use  . Smoking status: Former Smoker    Types: Cigarettes    Quit date: 12/18/1965    Years since quitting: 54.4  . Smokeless tobacco: Never Used  Substance and Sexual Activity  . Alcohol use: Yes    Comment: 5-7 drinks per week  . Drug use: No  . Sexual activity: Not on file  Other Topics Concern  . Not on file  Social History Narrative   Patient is married Jonathan Lane).   Patient is retired.   Patient has one adult child.   Patient does not drink any caffeine.   Patient is right-handed.   Patient has a Scientist, water quality.            Social Determinants of Health   Financial Resource Strain:   . Difficulty of Paying Living Expenses:   Food Insecurity:   . Worried About Charity fundraiser in the Last Year:   . Arboriculturist in the Last Year:   Transportation Needs:   . Film/video editor (Medical):   Marland Kitchen Lack of Transportation (Non-Medical):   Physical Activity:   . Days of Exercise per Week:   . Minutes of Exercise per Session:   Stress:   . Feeling of Stress :   Social Connections:   . Frequency of  Communication with Friends and  Family:   . Frequency of Social Gatherings with Friends and Family:   . Attends Religious Services:   . Active Member of Clubs or Organizations:   . Attends Archivist Meetings:   Marland Kitchen Marital Status:   Intimate Partner Violence:   . Fear of Current or Ex-Partner:   . Emotionally Abused:   Marland Kitchen Physically Abused:   . Sexually Abused:       PHYSICAL EXAM  Vitals:   05/05/20 1128  BP: 123/68  Pulse: (!) 54  Weight: 190 lb (86.2 kg)  Height: 6\' 2"  (1.88 m)   Body mass index is 24.39 kg/m.  Generalized: Well developed, in no acute distress  Chest: Lungs clear to auscultation bilaterally  Neurological examination  Mentation: Alert oriented to time, place, history taking. Follows all commands speech and language fluent Cranial nerve II-XII: Extraocular movements were full, visual field were full on confrontational test Head turning and shoulder shrug  were normal and symmetric. Motor: The motor testing reveals 5 over 5 strength of all 4 extremities. Good symmetric motor tone is noted throughout.  Sensory: Sensory testing is intact to soft touch on all 4 extremities. No evidence of extinction is noted.  Gait and station: Gait is normal.    DIAGNOSTIC DATA (LABS, IMAGING, TESTING) - I reviewed patient records, labs, notes, testing and imaging myself where available.  Lab Results  Component Value Date   WBC 7.4 09/26/2019   HGB 13.2 09/26/2019   HCT 40.8 09/26/2019   MCV 99.0 09/26/2019   PLT 197 09/26/2019      Component Value Date/Time   NA 138 03/10/2020 1043   K 4.1 03/10/2020 1043   CL 99 03/10/2020 1043   CO2 25 03/10/2020 1043   GLUCOSE 108 (H) 03/10/2020 1043   GLUCOSE 107 (H) 09/26/2019 0517   BUN 21 03/10/2020 1043   CREATININE 0.89 03/10/2020 1043   CALCIUM 9.4 03/10/2020 1043   PROT 6.4 (L) 09/26/2019 0517   ALBUMIN 3.6 09/26/2019 0517   AST 26 09/26/2019 0517   ALT 20 09/26/2019 0517   ALKPHOS 80 09/26/2019 0517    BILITOT 1.1 09/26/2019 0517   GFRNONAA 81 03/10/2020 1043   GFRAA 94 03/10/2020 1043   Lab Results  Component Value Date   CHOL 123 08/18/2019   HDL 37 (L) 08/18/2019   LDLCALC 75 08/18/2019   TRIG 54 08/18/2019   CHOLHDL 3.3 08/18/2019   Lab Results  Component Value Date   HGBA1C 5.6 08/26/2019   No results found for: VITAMINB12 No results found for: TSH    ASSESSMENT AND PLAN 79 y.o. year old male  has a past medical history of Atrial fibrillation ([facility]), Bradycardia, Bradycardia, Cardiac murmur, Dizziness, GERD (gastroesophageal reflux disease), Glaucoma, Heart palpitations, Hyperlipidemia, Hypertension, Numbness (08/26/2019), Skin abnormalities, Sleep apnea, and Snoring. here with:  1. OSA on CPAP  - CPAP compliance excellent - Good treatment of AHI  - Encourage patient to use CPAP nightly and > 4 hours each night - F/U in 1 year or sooner if needed   I spent 20 minutes of face-to-face and non-face-to-face time with patient.  This included previsit chart review, lab review, study review, order entry, electronic health record documentation, patient education.  Ward Givens, MSN, NP-C 05/05/2020, 11:27 AM [facility] [address] [place], [place] [address] [phone]

## 2020-05-07 NOTE — Progress Notes (Signed)
Dear Carron Brazen,  The problem with ResMed headgear is well known, while it is easier to use just elastic headgear without clips and Velcro, it come at the expense of elasticity over the long term. Is the patient is willing to try a Bella swift nasal pillow with ear loops and headgear? That model seems to hold up well and doesn't get easily dislodged.    As always, I agree with the assessment and plan as directed by NP on this visit . I was available for consultation.   Kalissa Grays, MD

## 2020-05-12 DIAGNOSIS — F411 Generalized anxiety disorder: Secondary | ICD-10-CM | POA: Diagnosis not present

## 2020-05-12 DIAGNOSIS — I10 Essential (primary) hypertension: Secondary | ICD-10-CM | POA: Diagnosis not present

## 2020-05-12 DIAGNOSIS — R7309 Other abnormal glucose: Secondary | ICD-10-CM | POA: Diagnosis not present

## 2020-05-12 DIAGNOSIS — M25562 Pain in left knee: Secondary | ICD-10-CM | POA: Diagnosis not present

## 2020-06-04 DIAGNOSIS — M25562 Pain in left knee: Secondary | ICD-10-CM | POA: Diagnosis not present

## 2020-06-09 DIAGNOSIS — H43813 Vitreous degeneration, bilateral: Secondary | ICD-10-CM | POA: Diagnosis not present

## 2020-06-09 DIAGNOSIS — H353223 Exudative age-related macular degeneration, left eye, with inactive scar: Secondary | ICD-10-CM | POA: Diagnosis not present

## 2020-06-09 DIAGNOSIS — H35371 Puckering of macula, right eye: Secondary | ICD-10-CM | POA: Diagnosis not present

## 2020-06-09 DIAGNOSIS — H353111 Nonexudative age-related macular degeneration, right eye, early dry stage: Secondary | ICD-10-CM | POA: Diagnosis not present

## 2020-06-16 DIAGNOSIS — M25562 Pain in left knee: Secondary | ICD-10-CM | POA: Diagnosis not present

## 2020-06-25 DIAGNOSIS — S83282D Other tear of lateral meniscus, current injury, left knee, subsequent encounter: Secondary | ICD-10-CM | POA: Diagnosis not present

## 2020-06-25 DIAGNOSIS — S83232D Complex tear of medial meniscus, current injury, left knee, subsequent encounter: Secondary | ICD-10-CM | POA: Diagnosis not present

## 2020-06-25 DIAGNOSIS — M25562 Pain in left knee: Secondary | ICD-10-CM | POA: Diagnosis not present

## 2020-07-01 ENCOUNTER — Telehealth: Payer: Self-pay | Admitting: *Deleted

## 2020-07-01 NOTE — Telephone Encounter (Signed)
..     [facility] Pre-operative Risk Assessment    HEARTCARE STAFF: - Please ensure there is not already an duplicate clearance open for this procedure. - Under Visit Info/Reason for Call, type in Other and utilize the format Clearance MM/DD/YY or Clearance TBD. Do not use dashes or single digits. - If request is for dental extraction, please clarify the # of teeth to be extracted.  Request for surgical clearance:  1. What type of surgery is being performed? Left knee arthroscopy   2. When is this surgery scheduled? 08/10/20   3. What type of clearance is required (medical clearance vs. Pharmacy clearance to hold med vs. Both)? Both  4. Are there any medications that need to be held prior to surgery and how long? Plavix   5. Practice name and name of physician performing surgery? [facility]; Dr Pilar Plate Aluisio   6. What is the office phone number? [phone]   7.   What is the office fax number? [place]  8.   Anesthesia type (None, local, MAC, general) ? Choice   Juventino Slovak 07/01/2020, 1:13 PM  _________________________________________________________________   (provider comments below)  .

## 2020-07-01 NOTE — Telephone Encounter (Signed)
   Primary Cardiologist: [name], MD  Chart reviewed as part of pre-operative protocol coverage. Patient was contacted 07/01/2020 in reference to pre-operative risk assessment for pending surgery as outlined below.  Jonathan Lane was last seen on 03/10/20 by Dr. Johnsie Cancel.  Since that day, Jonathan Lane has done well from a cardiac standpoint. He was quite active up until a month ago when his knee began bothering him. He can still complete 4 METs without anginal complaints. He has no complaints of dizziness, lightheadedness, palpitations, or syncope.  Therefore, based on ACC/AHA guidelines, the patient would be at acceptable risk for the planned procedure without further cardiovascular testing.   In regards to his plavix, this was prescribed following a presumed TIA and is managed by his PCP. Will defer to PCP on recommendations to hold plavix prior to his upcoming surgery. Please reach out to Dr. Sela Hilding for input.   I will route this recommendation to the requesting party via Epic fax function and remove from pre-op pool. Please call with questions.  Abigail Butts, PA-C 07/01/2020, 2:34 PM

## 2020-07-06 DIAGNOSIS — H353220 Exudative age-related macular degeneration, left eye, stage unspecified: Secondary | ICD-10-CM | POA: Diagnosis not present

## 2020-07-06 DIAGNOSIS — H353111 Nonexudative age-related macular degeneration, right eye, early dry stage: Secondary | ICD-10-CM | POA: Diagnosis not present

## 2020-07-06 DIAGNOSIS — H401132 Primary open-angle glaucoma, bilateral, moderate stage: Secondary | ICD-10-CM | POA: Diagnosis not present

## 2020-08-10 DIAGNOSIS — M23322 Other meniscus derangements, posterior horn of medial meniscus, left knee: Secondary | ICD-10-CM | POA: Diagnosis not present

## 2020-08-10 DIAGNOSIS — G8918 Other acute postprocedural pain: Secondary | ICD-10-CM | POA: Diagnosis not present

## 2020-08-10 DIAGNOSIS — M94262 Chondromalacia, left knee: Secondary | ICD-10-CM | POA: Diagnosis not present

## 2020-09-08 DIAGNOSIS — D485 Neoplasm of uncertain behavior of skin: Secondary | ICD-10-CM | POA: Diagnosis not present

## 2020-09-08 DIAGNOSIS — L308 Other specified dermatitis: Secondary | ICD-10-CM | POA: Diagnosis not present

## 2020-09-08 DIAGNOSIS — L570 Actinic keratosis: Secondary | ICD-10-CM | POA: Diagnosis not present

## 2020-09-21 NOTE — Progress Notes (Signed)
Cardiology Office Note   Date:  10/04/2020   ID:  Jonathan Lane, DOB [birth year], MRN [id number]  PCP:  Glenis Smoker, MD  Cardiologist:  Dr. Johnsie Cancel, MD   No chief complaint on file.   History of Present Illness: Jonathan Lane is a 79 y.o. male with a hx of atypical chest pain, HTN, bradycardia, OSA with CPAP use and palpitations with no documented AF who was seen in hospital consultation 08/18/2019 for the evaluation of chest pain His wife had passed away 3 days before this and he was under a lot of stress Had a prior myovue 03/26/18 that was normal  He r/o and was d/c   He then presented 08/25/2019 with left-sided facial numbness and left upper arm numbness that began on morning of presentation who was seen by neurology and found to have TIA.  MRI was normal  CTA with vertebral disease and distal left V4 segment proximal to VB junction  Dr. Erlinda Hong recommended ASA and Plavix for 3 weeks followed by Plavix alone.  His Lipitor was increased from 10 to 20 mg daily for secondary stroke prevention.  Also with recommendations for 30-day event monitor This was done 10/27/19 and no significant arrhythmias noted   TTE 08/18/19 EF >65% no significant valve disease   Wife died unexpectedly 2019/09/15  Appears she was admitted with sepsis And multi organ failure. He was not happy with her care and felt she was transferred to the ICU too late.   Had knee arthroscopy in August Plavix held without issues Nice relief of pain Done by Dr Maureen Ralphs  Has had all 3 COVID shots   Past Medical History:  Diagnosis Date   Atrial fibrillation (HCC)    Bradycardia    Bradycardia    Cardiac murmur    Dizziness    GERD (gastroesophageal reflux disease)    Glaucoma    Heart palpitations    Hyperlipidemia    Hypertension    Numbness 08/26/2019   LEFT FACE   Skin abnormalities    pre cancerous lesion R hand   Sleep apnea    uses cpap   Snoring     Past Surgical History:    Procedure Laterality Date   APPENDECTOMY  1965   COLONOSCOPY     POLYPECTOMY     TONSILLECTOMY     as a child     Current Outpatient Medications  Medication Sig Dispense Refill   Apoaequorin (PREVAGEN PO) Take 1 capsule by mouth daily. [facility] and memory.      atorvastatin (LIPITOR) 20 MG tablet Take 1 tablet (20 mg total) by mouth daily. 30 tablet 0   clopidogrel (PLAVIX) 75 MG tablet Take 75 mg by mouth daily.     Coenzyme Q10 (CO Q 10 PO) Take 1 capsule by mouth daily.      COSOPT PF 22.3-6.8 MG/ML SOLN Place 1 drop into both eyes 2 (two) times daily.      Cranberry 400 MG CAPS Take 1 capsule by mouth daily.     Cyanocobalamin (VITAMIN B 12 PO) Take by mouth daily.     diltiazem (DILACOR XR) 240 MG 24 hr capsule Take 1 capsule (240 mg total) by mouth daily. 90 capsule 2   esomeprazole (NEXIUM) 20 MG capsule Take 20 mg by mouth 2 (two) times daily before a meal.     fish oil-omega-3 fatty acids 1000 MG capsule Take 1 g by mouth 2 (two) times  daily.      Glucosamine-Chondroitin (MOVE FREE PO) Take 1 tablet by mouth daily.     losartan-hydrochlorothiazide (HYZAAR) 100-25 MG tablet TAKE 1 TABLET ONCE DAILY. 90 tablet 2   Misc Natural Products (PROSTATE HEALTH) CAPS Take 1 capsule by mouth daily.     multivitamin (THERAGRAN) per tablet Take 1 tablet by mouth daily.     nitroGLYCERIN (NITROSTAT) 0.4 MG SL tablet Place 1 tablet (0.4 mg total) under the tongue every 5 (five) minutes as needed for chest pain. 10 tablet 1   oxymetazoline (AFRIN) 0.05 % nasal spray Place 1 spray into both nostrils daily as needed for congestion.     Probiotic Product (ALIGN PO) Take 1 tablet by mouth daily. Take daily      RHOPRESSA 0.02 % SOLN Place 1 drop into both eyes at bedtime.      valACYclovir (VALTREX) 1000 MG tablet Take 1,000 mg by mouth daily as needed (outbreak).      Zinc 25 MG TABS Take by mouth.     No current facility-administered medications for this visit.     Allergies:   Brimonidine tartrate-timolol    Social History:  The patient  reports that he quit smoking about 54 years ago. His smoking use included cigarettes. He has never used smokeless tobacco. He reports current alcohol use. He reports that he does not use drugs.   Family History:  The patient's family history includes Breast cancer in his sister; Colon cancer in his paternal uncle and another family member; Diabetes in his sister; High blood pressure in his sister; Mitral valve prolapse in an other family member; Stroke in his father, mother, and sister.    ROS:  Please see the history of present illness.  Otherwise, review of systems are positive for none.   All other systems are reviewed and negative.    PHYSICAL EXAM: VS:  BP 140/70    Pulse (!) 56    Ht 6\' 2"  (1.88 m)    Wt 195 lb 9.6 oz (88.7 kg)    SpO2 96%    BMI 25.11 kg/m  , BMI Body mass index is 25.11 kg/m.   Affect appropriate Healthy:  appears stated age 27: normal Neck supple with no adenopathy JVP normal no bruits no thyromegaly Lungs clear with no wheezing and good diaphragmatic motion Heart:  S1/S2 no murmur, no rub, gallop or click PMI normal Abdomen: benighn, BS positve, no tenderness, no AAA no bruit.  No HSM or HJR Distal pulses intact with no bruits No edema Neuro non-focal Skin warm and dry No muscular weakness  E KG:   SB rate 52 poor R wave progression 08/26/19  03/10/20 SR rate 54 normal    Recent Labs: 03/10/2020: BUN 21; Creatinine, Ser 0.89; Potassium 4.1; Sodium 138    Lipid Panel    Component Value Date/Time   CHOL 123 08/18/2019 0550   TRIG 54 08/18/2019 0550   HDL 37 (L) 08/18/2019 0550   CHOLHDL 3.3 08/18/2019 0550   VLDL 11 08/18/2019 0550   LDLCALC 75 08/18/2019 0550      Wt Readings from Last 3 Encounters:  10/04/20 195 lb 9.6 oz (88.7 kg)  05/05/20 190 lb (86.2 kg)  04/14/20 193 lb (87.5 kg)     Other studies Reviewed: Additional studies/ records that were  reviewed today include:   Myoview stress test 03/26/2018:   Nuclear stress EF: 71%.  The study is normal.  This is a low risk study.  The  left ventricular ejection fraction is hyperdynamic (>65%).  There was no ST segment deviation noted during stress. BP did drop in Stage 3 but likely erroneous as he had a hypertensive BP response in recovery.  Echocardiogram 08/18/2019:   1. The left ventricle has hyperdynamic systolic function, with an ejection fraction of >65%. The cavity size was normal. There is moderately increased left ventricular wall thickness. Left ventricular diastolic Doppler parameters are consistent with  impaired relaxation.  2. The right ventricle has normal systolic function. The cavity was normal. There is no increase in right ventricular wall thickness.  3. Mild calcification of the mitral valve leaflet. There is mild mitral annular calcification present. No evidence of mitral valve stenosis. No significant mitral regurgitation.  4. The aortic valve is tricuspid. Mild calcification of the aortic valve. No stenosis of the aortic valve.  5. The inferior vena cava was dilated in size with <50% respiratory variability. No complete TR doppler jet so unable to estimate PA systolic pressure.  6. The aortic root is normal in size and structure.  7. Trivial pericardial effusion is present.  ASSESSMENT AND PLAN:  1. Atypical chest pain: - with normal myovue 03/26/18 on plavix and statin    2. HTN: - Well controlled.  Continue current medications and low sodium Dash type diet.     3. Hx of palpitations: -negative monitor 10/27/19 on cardizem   4. HLD: on statin last LDL 08/18/19 was 75    -  6.  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 TIA:  F/u neurology no PaF on monitor no large vessel ICA stenosis on CTA  Continue plavix MRI was negative   8. Grief:  Long discussion with him about loosing his wife and the care that she received. They were married 48 years and it has been very hard on him. I think he will get through this without need for SSRI. Has good family and neighborhood support     Current medicines are reviewed at length with the patient today.  The patient does not have concerns regarding medicines.  The following changes have been made:  no change  Labs/ tests ordered today include:  None    No orders of the defined types were placed in this encounter.   Disposition:   FU VVS for iliac aneurysm f/u with me in a year   Signed, [name], MD  10/04/2020 9:33 AM    [facility] [address], [place], [place]  [address] Phone: [phone]; Fax: [phone]

## 2020-10-04 ENCOUNTER — Encounter: Payer: Self-pay | Admitting: Cardiovascular Disease

## 2020-10-04 ENCOUNTER — Ambulatory Visit: Payer: PPO | Admitting: Cardiovascular Disease

## 2020-10-04 ENCOUNTER — Other Ambulatory Visit: Payer: Self-pay

## 2020-10-04 VITALS — BP 140/70 | HR 56 | Ht 74.0 in | Wt 195.6 lb

## 2020-10-04 DIAGNOSIS — I10 Essential (primary) hypertension: Secondary | ICD-10-CM

## 2020-10-04 DIAGNOSIS — R079 Chest pain, unspecified: Secondary | ICD-10-CM

## 2020-10-04 DIAGNOSIS — I723 Aneurysm of iliac artery: Secondary | ICD-10-CM | POA: Diagnosis not present

## 2020-10-04 NOTE — Patient Instructions (Addendum)
Medication Instructions:  *If you need a refill on your cardiac medications before your next appointment, please call your pharmacy*  Lab Work: If you have labs (blood work) drawn today and your tests are completely normal, you will receive your results only by: Marland Kitchen MyChart Message (if you have MyChart) OR . A paper copy in the mail If you have any lab test that is abnormal or we need to change your treatment, we will call you to review the results.  Testing/Procedures: Your physician has requested that you have a lower extremity arterial duplex. This test is an ultrasound of the arteries in the legs. It looks at arterial blood flow in the legs. Allow one hour for Lower Arterial scans. There are no restrictions or special instructions   Follow-Up: At [facility], you and your health needs are our priority.  As part of our continuing mission to provide you with exceptional heart care, we have created designated Provider Care Teams.  These Care Teams include your primary Cardiologist (physician) and Advanced Practice Providers (APPs -  Physician Assistants and Nurse Practitioners) who all work together to provide you with the care you need, when you need it.  We recommend signing up for the patient portal called "MyChart".  Sign up information is provided on this After Visit Summary.  MyChart is used to connect with patients for Virtual Visits (Telemedicine).  Patients are able to view lab/test results, encounter notes, upcoming appointments, etc.  Non-urgent messages can be sent to your provider as well.   To learn more about what you can do with MyChart, go to [email or web address].    Your next appointment:   12 month(s)  The format for your next appointment:   In Person  Provider:   You may see [name], MD or one of the following Advanced Practice Providers on your designated Care Team:    Truitt Merle, NP  Cecilie Kicks, NP  Kathyrn Drown, NP

## 2020-10-06 DIAGNOSIS — H353223 Exudative age-related macular degeneration, left eye, with inactive scar: Secondary | ICD-10-CM | POA: Diagnosis not present

## 2020-10-06 DIAGNOSIS — H35371 Puckering of macula, right eye: Secondary | ICD-10-CM | POA: Diagnosis not present

## 2020-10-06 DIAGNOSIS — H353111 Nonexudative age-related macular degeneration, right eye, early dry stage: Secondary | ICD-10-CM | POA: Diagnosis not present

## 2020-10-06 DIAGNOSIS — H43813 Vitreous degeneration, bilateral: Secondary | ICD-10-CM | POA: Diagnosis not present

## 2020-10-12 DIAGNOSIS — L986 Other infiltrative disorders of the skin and subcutaneous tissue: Secondary | ICD-10-CM | POA: Diagnosis not present

## 2020-10-12 DIAGNOSIS — Z86018 Personal history of other benign neoplasm: Secondary | ICD-10-CM | POA: Diagnosis not present

## 2020-10-12 DIAGNOSIS — L570 Actinic keratosis: Secondary | ICD-10-CM | POA: Diagnosis not present

## 2020-10-12 DIAGNOSIS — L578 Other skin changes due to chronic exposure to nonionizing radiation: Secondary | ICD-10-CM | POA: Diagnosis not present

## 2020-10-12 DIAGNOSIS — D225 Melanocytic nevi of trunk: Secondary | ICD-10-CM | POA: Diagnosis not present

## 2020-10-12 DIAGNOSIS — Z85828 Personal history of other malignant neoplasm of skin: Secondary | ICD-10-CM | POA: Diagnosis not present

## 2020-10-12 DIAGNOSIS — D2262 Melanocytic nevi of left upper limb, including shoulder: Secondary | ICD-10-CM | POA: Diagnosis not present

## 2020-10-12 DIAGNOSIS — L821 Other seborrheic keratosis: Secondary | ICD-10-CM | POA: Diagnosis not present

## 2020-10-12 DIAGNOSIS — D485 Neoplasm of uncertain behavior of skin: Secondary | ICD-10-CM | POA: Diagnosis not present

## 2020-10-12 DIAGNOSIS — D2221 Melanocytic nevi of right ear and external auricular canal: Secondary | ICD-10-CM | POA: Diagnosis not present

## 2020-10-12 DIAGNOSIS — C44619 Basal cell carcinoma of skin of left upper limb, including shoulder: Secondary | ICD-10-CM | POA: Diagnosis not present

## 2020-11-02 DIAGNOSIS — Z961 Presence of intraocular lens: Secondary | ICD-10-CM | POA: Diagnosis not present

## 2020-11-02 DIAGNOSIS — H353110 Nonexudative age-related macular degeneration, right eye, stage unspecified: Secondary | ICD-10-CM | POA: Diagnosis not present

## 2020-11-02 DIAGNOSIS — H353220 Exudative age-related macular degeneration, left eye, stage unspecified: Secondary | ICD-10-CM | POA: Diagnosis not present

## 2020-11-02 DIAGNOSIS — H401132 Primary open-angle glaucoma, bilateral, moderate stage: Secondary | ICD-10-CM | POA: Diagnosis not present

## 2020-11-16 DIAGNOSIS — N529 Male erectile dysfunction, unspecified: Secondary | ICD-10-CM | POA: Diagnosis not present

## 2020-11-16 DIAGNOSIS — K219 Gastro-esophageal reflux disease without esophagitis: Secondary | ICD-10-CM | POA: Diagnosis not present

## 2020-11-16 DIAGNOSIS — I700 Atherosclerosis of aorta: Secondary | ICD-10-CM | POA: Diagnosis not present

## 2020-11-16 DIAGNOSIS — N400 Enlarged prostate without lower urinary tract symptoms: Secondary | ICD-10-CM | POA: Diagnosis not present

## 2020-11-16 DIAGNOSIS — I10 Essential (primary) hypertension: Secondary | ICD-10-CM | POA: Diagnosis not present

## 2020-11-16 DIAGNOSIS — I723 Aneurysm of iliac artery: Secondary | ICD-10-CM | POA: Diagnosis not present

## 2020-11-16 DIAGNOSIS — Z0000 Encounter for general adult medical examination without abnormal findings: Secondary | ICD-10-CM | POA: Diagnosis not present

## 2020-11-16 DIAGNOSIS — E782 Mixed hyperlipidemia: Secondary | ICD-10-CM | POA: Diagnosis not present

## 2020-12-28 DIAGNOSIS — D485 Neoplasm of uncertain behavior of skin: Secondary | ICD-10-CM | POA: Diagnosis not present

## 2020-12-28 DIAGNOSIS — L988 Other specified disorders of the skin and subcutaneous tissue: Secondary | ICD-10-CM | POA: Diagnosis not present

## 2020-12-30 DIAGNOSIS — C44619 Basal cell carcinoma of skin of left upper limb, including shoulder: Secondary | ICD-10-CM | POA: Diagnosis not present

## 2020-12-30 DIAGNOSIS — L988 Other specified disorders of the skin and subcutaneous tissue: Secondary | ICD-10-CM | POA: Diagnosis not present

## 2021-01-13 ENCOUNTER — Other Ambulatory Visit: Payer: Self-pay | Admitting: Cardiovascular Disease

## 2021-01-19 ENCOUNTER — Telehealth: Payer: Self-pay | Admitting: Adult Health

## 2021-01-19 DIAGNOSIS — G4733 Obstructive sleep apnea (adult) (pediatric): Secondary | ICD-10-CM

## 2021-01-19 DIAGNOSIS — Z9989 Dependence on other enabling machines and devices: Secondary | ICD-10-CM

## 2021-01-19 NOTE — Telephone Encounter (Signed)
Please let the patient know that since it has been over 5 years since he had his sleep test we would have to repeat a home sleep test in order for him to get a new machine

## 2021-01-19 NOTE — Telephone Encounter (Signed)
Pt called, my CPAP machine is making noises that's keeping me up. I called the company for a replacement. They said, physician would need to send a prescription. Would like a call from the nurse.

## 2021-01-19 NOTE — Telephone Encounter (Signed)
Spoke to pt, last seen 04-2020.  Has appt 04-2021. Interested and is eligible for new cpap machine.  Wants to order.  I told him of possible wait due recall and he was ok with that.  Please advise.

## 2021-01-20 NOTE — Telephone Encounter (Signed)
Spoke to pt and he is ok to proceed with HST.  I relayed will go thru authorization process then they will call to set it up. He appreciated call back.

## 2021-01-24 NOTE — Telephone Encounter (Signed)
Order placed

## 2021-01-24 NOTE — Addendum Note (Signed)
Addended by: Trudie Buckler on: 01/24/2021 11:44 AM   Modules accepted: Orders

## 2021-01-26 ENCOUNTER — Telehealth: Payer: Self-pay

## 2021-01-26 NOTE — Telephone Encounter (Signed)
LVM for pt to call me back to schedule sleep study  

## 2021-02-09 ENCOUNTER — Other Ambulatory Visit: Payer: Self-pay | Admitting: Cardiovascular Disease

## 2021-02-14 ENCOUNTER — Ambulatory Visit (INDEPENDENT_AMBULATORY_CARE_PROVIDER_SITE_OTHER): Payer: PPO | Admitting: Neurology

## 2021-02-14 DIAGNOSIS — G4733 Obstructive sleep apnea (adult) (pediatric): Secondary | ICD-10-CM | POA: Diagnosis not present

## 2021-02-16 NOTE — Progress Notes (Signed)
   [facility] TEST ( HST by Watch PAT)  STUDY DATA: 02/16/21  DOB: February 11, 1941 MRN: [id number]  ORDERING CLINICIAN: Larey Seat, MD/ Ward Givens,. NP    REFERRING CLINICIAN: Glenis Smoker, MD   CLINICAL INFORMATION/HISTORY: Jonathan Lane is a 80 year- old male with a history of obstructive sleep apnea on CPAP.He has a history of atrial fibrillation, Bradycardia, HTN, Vertigo, GERD.   His download indicates that he uses machine nightly for compliance of 100%.   He uses machine greater than 4 hours each night.  On average, he uses his machine 7 hours and 48 minutes.  His residual AHI is 1.4/h  on 14 cm of water with an EPR of 3 cm water.  Leak is very high-in the 95th percentile is 45.6 L/min.   He reports that his straps give out before he is able to get new ones ( nasal pillow) .  His machine is 81 years old.and he is considered CPAP dependent.   Epworth sleepiness score on CPAP is : 2/24  BMI: 24.39 kg/m  Neck Circumference: 16"  FINDINGS:   Total Record Time (hours, min): 8 h 4 min Total Sleep Time (hours, min):  6 h 11 min  Percent REM (%):    4.6 %   Calculated pAHI (per hour): 6.3       REM pAHI: N/A   NREM pAHI: N/A Supine: 7.6   Oxygen Saturation (%) Mean: 93  Minimum oxygen saturation (%):        87   O2 Saturation Range (%): 36-93  O2Saturation (minutes) <=88%: 0.2 min  Pulse Mean (bpm):    48  Pulse Range (36-93)   IMPRESSION: This HST confirmed  mild OSA (obstructive sleep apnea) to be present at AHI of 6.3/h . Unfortunately,  REM sleep apnea could not be differentiated, as REM made up only 4.6% of sleep time per HST calculation. The patient showed a mild exacerbation with sleep in supine position to AHI of 7.6/h.   RECOMMENDATION: The patient can continue to use CPAP for treatment of OSA, no central apnea has arisen. The new device will provide heated humidification and will be set to 6-17 cm water with 3 cm EPR-  and the patient can chose a new mask/ interface , similar to the current, with sturdier headgear.    INTERPRETING PHYSICIAN:  Larey Seat, MD [facility] and [facility] Sleep Board certified Diplomate of the [employer] of Sleep Medicine. Board certified In Neurology through the [place], Fellow of the [employer] of Neurology. Medical Director of [employer].

## 2021-02-21 ENCOUNTER — Encounter (HOSPITAL_COMMUNITY): Payer: PPO

## 2021-02-27 ENCOUNTER — Telehealth: Payer: Self-pay | Admitting: Neurology

## 2021-02-27 DIAGNOSIS — Z9989 Dependence on other enabling machines and devices: Secondary | ICD-10-CM

## 2021-02-27 DIAGNOSIS — M2619 Other specified anomalies of jaw-cranial base relationship: Secondary | ICD-10-CM

## 2021-02-27 DIAGNOSIS — G459 Transient cerebral ischemic attack, unspecified: Secondary | ICD-10-CM

## 2021-02-27 DIAGNOSIS — K219 Gastro-esophageal reflux disease without esophagitis: Secondary | ICD-10-CM

## 2021-02-27 DIAGNOSIS — I480 Paroxysmal atrial fibrillation: Secondary | ICD-10-CM

## 2021-02-27 DIAGNOSIS — G4733 Obstructive sleep apnea (adult) (pediatric): Secondary | ICD-10-CM

## 2021-02-27 NOTE — Procedures (Signed)
[facility] TEST ( HST by Watch PAT)  STUDY DATA: 02/16/21  DOB: [birth year] MRN: [id number]  ORDERING CLINICIAN: Larey Seat, MD/ Ward Givens,. NP    REFERRING CLINICIAN: Glenis Smoker, MD   CLINICAL INFORMATION/HISTORY: Mr. Jonathan Lane is a 80 year- old male with a history of obstructive sleep apnea on CPAP.He has a history of atrial fibrillation, Bradycardia, HTN, Vertigo, GERD.   His download indicates that he uses machine nightly for compliance of 100%.   He uses machine greater than 4 hours each night.  On average, he uses his machine 7 hours and 48 minutes.  His residual AHI is 1.4/h  on 14 cm of water with an EPR of 3 cm water.  Leak is very high-in the 95th percentile is 45.6 L/min.   He reports that his straps give out before he is able to get new ones ( nasal pillow) .  His machine is 82 years old.and he is considered CPAP dependent.   Epworth sleepiness score on CPAP is : 2/24  BMI: 24.39 kg/m  Neck Circumference: 16"  FINDINGS:   Total Record Time (hours, min): 8 h 4 min Total Sleep Time (hours, min):  6 h 11 min  Percent REM (%):    4.6 %   Calculated pAHI (per hour): 6.3       REM pAHI: N/A   NREM pAHI: N/A Supine: 7.6   Oxygen Saturation (%) Mean: 93  Minimum oxygen saturation (%):        87   O2 Saturation Range (%): 36-93  O2Saturation (minutes) <=88%: 0.2 min  Pulse Mean (bpm):    48  Pulse Range (36-93)   IMPRESSION: This HST confirmed  mild OSA (obstructive sleep apnea) to be present at AHI of 6.3/h . Unfortunately,  REM sleep apnea could not be differentiated, as REM made up only 4.6% of sleep time per HST calculation. The patient showed a mild exacerbation with sleep in supine position to AHI of 7.6/h.   RECOMMENDATION: The patient can continue to use CPAP for treatment of OSA, no central apnea has arisen. The new device will provide heated humidification and will be set to 6-17 cm water with 3 cm EPR- and  the patient can chose a new mask/ interface , similar to the current, with sturdier headgear.    INTERPRETING PHYSICIAN:  Larey Seat, MD [facility] and [facility] Sleep Board certified Diplomate of the [employer] of Sleep Medicine. Board certified In Neurology through the [place], Fellow of the [employer] of Neurology. Medical Director of [employer].

## 2021-02-27 NOTE — Telephone Encounter (Signed)
[facility] TEST ( HST by Watch PAT)  STUDY DATA: 02/16/21  DOB: 1941/12/18 MRN: [id number]  ORDERING CLINICIAN: Larey Seat, MD/ Ward Givens,. NP    REFERRING CLINICIAN: Glenis Smoker, MD   CLINICAL INFORMATION/HISTORY: Mr. Jonathan Lane is a 80 year- old male with a history of obstructive sleep apnea on CPAP.He has a history of atrial fibrillation, Bradycardia, HTN, Vertigo, GERD.   His download indicates that he uses machine nightly for compliance of 100%.   He uses machine greater than 4 hours each night.  On average, he uses his machine 7 hours and 48 minutes.  His residual AHI is 1.4/h  on 14 cm of water with an EPR of 3 cm water.  Leak is very high-in the 95th percentile is 45.6 L/min.   He reports that his straps give out before he is able to get new ones ( nasal pillow) .  His machine is 81 years old.and he is considered CPAP dependent.   Epworth sleepiness score on CPAP is : 2/24  BMI: 24.39 kg/m  Neck Circumference: 16"  FINDINGS:   Total Record Time (hours, min): 8 h 4 min Total Sleep Time (hours, min):  6 h 11 min  Percent REM (%):    4.6 %   Calculated pAHI (per hour): 6.3       REM pAHI: N/A   NREM pAHI: N/A Supine: 7.6   Oxygen Saturation (%) Mean: 93  Minimum oxygen saturation (%):        87   O2 Saturation Range (%): 36-93  O2Saturation (minutes) <=88%: 0.2 min  Pulse Mean (bpm):    48  Pulse Range (36-93)   IMPRESSION: This HST confirmed mild OSA (obstructive sleep apnea) to be present at AHI of 6.3/h . Unfortunately,  REM sleep apnea could not be differentiated, as REM made up only 4.6% of sleep time per HST calculation. The patient showed a mild exacerbation with sleep in supine position to AHI of 7.6/h.   RECOMMENDATION: The patient can continue to use CPAP for treatment of OSA, no central apnea has arisen. The new device will provide heated humidification and will be set to 6-17 cm water with 3 cm EPR- and  the patient can chose a new mask/ interface , similar to the current, with sturdier headgear.    INTERPRETING PHYSICIAN:  Larey Seat, MD [facility] and [facility] Sleep Board certified Diplomate of the [employer] of Sleep Medicine. Board certified In Neurology through the [place], Fellow of the [employer] of Neurology. Medical Director of [employer].

## 2021-02-28 NOTE — Telephone Encounter (Signed)
I called pt. I advised pt that Dr. Brett Fairy reviewed their sleep study results and found that pt has severe sleep apnea. Dr. Brett Fairy recommends that pt starts auto CPAP. I reviewed PAP compliance expectations with the pt. Pt is agreeable to starting a CPAP. I advised pt that an order will be sent to a DME, [facility] and [facility]) will call the pt within about one week after they file with the pt's insurance. [facility]) will show the pt how to use the machine, fit for masks, and troubleshoot the CPAP if needed. A follow up appt will need to be made for insurance purposes with Dr. Brett Fairy or the NP. Pt verbalized understanding to call and schedule the initial CPAP visit within 31-90 days from the date he picks the machine up. A letter with all of this information in it will be sent to the pt as a reminder. Pt verbalized understanding of results. Pt had no questions at this time but was encouraged to call back if questions arise. I have sent the order to [facility]) and have received confirmation that they have received the order.

## 2021-03-23 ENCOUNTER — Encounter (HOSPITAL_COMMUNITY): Payer: PPO

## 2021-04-11 ENCOUNTER — Other Ambulatory Visit: Payer: Self-pay | Admitting: Urology

## 2021-04-12 ENCOUNTER — Other Ambulatory Visit (HOSPITAL_COMMUNITY): Payer: PPO

## 2021-04-15 ENCOUNTER — Ambulatory Visit (HOSPITAL_COMMUNITY)
Admission: RE | Admit: 2021-04-15 | Discharge: 2021-04-15 | Disposition: A | Discharge status: Home / Self Care | Payer: PPO | Source: Ambulatory Visit | Attending: Vascular Surgery | Admitting: Vascular Surgery

## 2021-04-15 DIAGNOSIS — I723 Aneurysm of iliac artery: Secondary | ICD-10-CM | POA: Diagnosis not present

## 2021-04-20 DIAGNOSIS — R351 Nocturia: Secondary | ICD-10-CM | POA: Diagnosis not present

## 2021-04-20 DIAGNOSIS — H353211 Exudative age-related macular degeneration, right eye, with active choroidal neovascularization: Secondary | ICD-10-CM | POA: Diagnosis not present

## 2021-04-20 DIAGNOSIS — N5201 Erectile dysfunction due to arterial insufficiency: Secondary | ICD-10-CM | POA: Diagnosis not present

## 2021-04-20 DIAGNOSIS — N401 Enlarged prostate with lower urinary tract symptoms: Secondary | ICD-10-CM | POA: Diagnosis not present

## 2021-04-20 DIAGNOSIS — H43813 Vitreous degeneration, bilateral: Secondary | ICD-10-CM | POA: Diagnosis not present

## 2021-04-20 DIAGNOSIS — H35371 Puckering of macula, right eye: Secondary | ICD-10-CM | POA: Diagnosis not present

## 2021-04-20 DIAGNOSIS — Z87442 Personal history of urinary calculi: Secondary | ICD-10-CM | POA: Diagnosis not present

## 2021-04-20 DIAGNOSIS — H353223 Exudative age-related macular degeneration, left eye, with inactive scar: Secondary | ICD-10-CM | POA: Diagnosis not present

## 2021-04-21 DIAGNOSIS — H353211 Exudative age-related macular degeneration, right eye, with active choroidal neovascularization: Secondary | ICD-10-CM | POA: Diagnosis not present

## 2021-04-27 ENCOUNTER — Ambulatory Visit: Payer: PPO | Admitting: Vascular Surgery

## 2021-04-29 DIAGNOSIS — Z85828 Personal history of other malignant neoplasm of skin: Secondary | ICD-10-CM | POA: Diagnosis not present

## 2021-04-29 DIAGNOSIS — D225 Melanocytic nevi of trunk: Secondary | ICD-10-CM | POA: Diagnosis not present

## 2021-04-29 DIAGNOSIS — D224 Melanocytic nevi of scalp and neck: Secondary | ICD-10-CM | POA: Diagnosis not present

## 2021-04-29 DIAGNOSIS — L570 Actinic keratosis: Secondary | ICD-10-CM | POA: Diagnosis not present

## 2021-04-29 DIAGNOSIS — L821 Other seborrheic keratosis: Secondary | ICD-10-CM | POA: Diagnosis not present

## 2021-04-29 DIAGNOSIS — D485 Neoplasm of uncertain behavior of skin: Secondary | ICD-10-CM | POA: Diagnosis not present

## 2021-04-29 DIAGNOSIS — Z86018 Personal history of other benign neoplasm: Secondary | ICD-10-CM | POA: Diagnosis not present

## 2021-04-29 DIAGNOSIS — L578 Other skin changes due to chronic exposure to nonionizing radiation: Secondary | ICD-10-CM | POA: Diagnosis not present

## 2021-04-29 DIAGNOSIS — D2262 Melanocytic nevi of left upper limb, including shoulder: Secondary | ICD-10-CM | POA: Diagnosis not present

## 2021-05-04 ENCOUNTER — Encounter: Payer: Self-pay | Admitting: Vascular Surgery

## 2021-05-04 ENCOUNTER — Ambulatory Visit: Payer: PPO | Admitting: Vascular Surgery

## 2021-05-04 ENCOUNTER — Other Ambulatory Visit: Payer: Self-pay

## 2021-05-04 VITALS — BP 137/77 | HR 54 | Temp 98.3°F | Resp 20 | Ht 74.0 in | Wt 197.0 lb

## 2021-05-04 DIAGNOSIS — I723 Aneurysm of iliac artery: Secondary | ICD-10-CM | POA: Diagnosis not present

## 2021-05-04 NOTE — Progress Notes (Signed)
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  He is asymptomatic.  He has normal peripheral pulses.  Duplex did suggest some mild stenosis in the common iliac artery but I do not think this is clinically significant.  He is not a smoker.  I encouraged him to stay as active as possible.  I have ordered a follow-up duplex in 1 year and I will see him back at that time.  I do not think we need to follow this by CT scan unless it starts to show significant enlargement.  He will call sooner if he has problems.   HPI:   Jonathan Lane is a pleasant 80 y.o. 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  He was asymptomatic.  I recommended a follow-up CT angiogram in 6 months.  I saw him again on 04/14/2020 in the right common iliac artery measured 1.7 cm and maximum diameter.  This was stable in size.  He had some ulceration to this but again this had not changed.  I explained we would typically not consider elective repair unless the iliac aneurysm reached 3.5 cm in maximum diameter.  Fortunately he was not a smoker.  His blood pressure was under good control.  He comes in for a 1 year follow-up visit.  Since I saw him last, he underwent repair of a meniscal tear in his left knee and is now back ambulating again.  I do not get any history of claudication or rest pain.  He said no history of nonhealing ulcers.  He is not a smoker.  He denies any significant abdominal pain or back pain.  Past Medical History:  Diagnosis Date  . AAA (abdominal aortic aneurysm) ([place])   . Atrial fibrillation ([place])   . Bradycardia   . Bradycardia   . Cardiac murmur   . Dizziness   . GERD (gastroesophageal reflux disease)   . Glaucoma   . Heart palpitations   .  Hyperlipidemia   . Hypertension   . Numbness 08/26/2019   LEFT FACE  . Skin abnormalities    pre cancerous lesion R hand  . Sleep apnea    uses cpap  . Snoring     Family History  Problem Relation Age of Onset  . Stroke Father   . Stroke Mother   . Stroke Sister   . Breast cancer Sister   . High blood pressure Sister   . Diabetes Sister   . Colon cancer Other        Uncle  . Colon cancer Paternal Uncle   . Mitral valve prolapse Other        sugery 09-27-2017  . Rectal cancer Neg Hx   . Stomach cancer Neg Hx     SOCIAL HISTORY: Social History   Tobacco Use  . Smoking status: Former Smoker    Types: Cigarettes    Quit date: 12/18/1965    Years since quitting: 55.4  . Smokeless tobacco: Never Used  Substance Use Topics  . Alcohol use: Yes    Comment: 5-7 drinks per week    Allergies  Allergen Reactions  . Brimonidine Tartrate-Timolol Itching and Rash    Itching eyes Itching eyes, rash Itching eyes, rash Itching eyes, rash  Current Outpatient Medications  Medication Sig Dispense Refill  . Apoaequorin (PREVAGEN PO) Take 1 capsule by mouth daily. [facility] and memory.    Marland Kitchen atorvastatin (LIPITOR) 20 MG tablet Take 1 tablet (20 mg total) by mouth daily. 30 tablet 0  . clopidogrel (PLAVIX) 75 MG tablet Take 75 mg by mouth daily.    . Coenzyme Q10 (CO Q 10 PO) Take 1 capsule by mouth daily.     . COSOPT PF 22.3-6.8 MG/ML SOLN Place 1 drop into both eyes 2 (two) times daily.     . Cranberry 400 MG CAPS Take 1 capsule by mouth daily.    . Cyanocobalamin (VITAMIN B 12 PO) Take by mouth daily.    Marland Kitchen diltiazem (CARDIZEM CD) 240 MG 24 hr capsule TAKE (1) CAPSULE DAILY. 90 capsule 3  . diltiazem (DILACOR XR) 240 MG 24 hr capsule Take 1 capsule (240 mg total) by mouth daily. 90 capsule 2  . esomeprazole (NEXIUM) 20 MG capsule Take 20 mg by mouth 2 (two) times daily before a meal.    . fish oil-omega-3 fatty acids 1000 MG capsule Take 1 g by mouth 2 (two) times daily.      . Glucosamine-Chondroitin (MOVE FREE PO) Take 1 tablet by mouth daily.    Marland Kitchen losartan-hydrochlorothiazide (HYZAAR) 100-25 MG tablet TAKE 1 TABLET ONCE DAILY. 90 tablet 2  . Misc Natural Products (PROSTATE HEALTH) CAPS Take 1 capsule by mouth daily.    . multivitamin (THERAGRAN) per tablet Take 1 tablet by mouth daily.    . nitroGLYCERIN (NITROSTAT) 0.4 MG SL tablet Place 1 tablet (0.4 mg total) under the tongue every 5 (five) minutes as needed for chest pain. 10 tablet 1  . oxymetazoline (AFRIN) 0.05 % nasal spray Place 1 spray into both nostrils daily as needed for congestion.    . Probiotic Product (ALIGN PO) Take 1 tablet by mouth daily. Take daily    . RHOPRESSA 0.02 % SOLN Place 1 drop into both eyes at bedtime.     . valACYclovir (VALTREX) 1000 MG tablet Take 1,000 mg by mouth daily as needed (outbreak).     . Zinc 25 MG TABS Take by mouth.     No current facility-administered medications for this visit.    REVIEW OF SYSTEMS:  [X]  denotes positive finding, [ ]  denotes negative finding Cardiac  Comments:  Chest pain or chest pressure:    Shortness of breath upon exertion:    Short of breath when lying flat:    Irregular heart rhythm:        Vascular    Pain in calf, thigh, or hip brought on by ambulation:    Pain in feet at night that wakes you up from your sleep:     Blood clot in your veins:    Leg swelling:         Pulmonary    Oxygen at home:    Productive cough:     Wheezing:         Neurologic    Sudden weakness in arms or legs:     Sudden numbness in arms or legs:     Sudden onset of difficulty speaking or slurred speech:    Temporary loss of vision in one eye:     Problems with dizziness:         Gastrointestinal    Blood in stool:     Vomited blood:         Genitourinary    Burning  when urinating:     Blood in urine:        Psychiatric    Major depression:         Hematologic    Bleeding problems:    Problems with blood clotting too easily:         Skin    Rashes or ulcers:        Constitutional    Fever or chills:     PHYSICAL EXAM:   Vitals:   05/04/21 1034  BP: 137/77  Pulse: (!) 54  Resp: 20  Temp: 98.3 F (36.8 C)  SpO2: 93%  Weight: 197 lb (89.4 kg)  Height: 6\' 2"  (1.88 m)    GENERAL: The patient is a well-nourished male, in no acute distress. The vital signs are documented above. CARDIAC: There is a regular rate and rhythm.  VASCULAR: I do not detect carotid bruits. He has palpable femoral, popliteal, dorsalis pedis, and posterior tibial pulses bilaterally. PULMONARY: There is good air exchange bilaterally without wheezing or rales. ABDOMEN: Soft and non-tender with normal pitched bowel sounds.  MUSCULOSKELETAL: There are no major deformities or cyanosis. NEUROLOGIC: No focal weakness or paresthesias are detected. SKIN: There are no ulcers or rashes noted. PSYCHIATRIC: The patient has a normal affect.  DATA:    AORTIC DUPLEX: I have reviewed the aortic duplex that was done on 04/15/2021.  This shows that the maximum diameter of the infrarenal aorta is 2.1 cm.  The right common iliac artery measures 1.2 cm in maximum diameter.  The left common iliac artery measures 1.5 cm in maximum diameter.  There are some mild right internal iliac artery stenosis.  Deitra Mayo [facility] [phone]

## 2021-05-11 ENCOUNTER — Encounter: Payer: Self-pay | Admitting: Adult Health

## 2021-05-11 ENCOUNTER — Ambulatory Visit: Payer: PPO | Admitting: Adult Health

## 2021-05-11 VITALS — BP 117/65 | HR 55 | Ht 74.0 in | Wt 196.0 lb

## 2021-05-11 DIAGNOSIS — G4733 Obstructive sleep apnea (adult) (pediatric): Secondary | ICD-10-CM | POA: Diagnosis not present

## 2021-05-11 DIAGNOSIS — Z9989 Dependence on other enabling machines and devices: Secondary | ICD-10-CM | POA: Diagnosis not present

## 2021-05-11 NOTE — Patient Instructions (Signed)
Continue using CPAP nightly and greater than 4 hours each night °If your symptoms worsen or you develop new symptoms please let us know.  ° °

## 2021-05-11 NOTE — Progress Notes (Signed)
PATIENT: Jonathan Lane DOB: [birth year]  REASON FOR VISIT: follow up HISTORY FROM: patient  HISTORY OF PRESENT ILLNESS: Today 05/11/21:  Jonathan Lane is an 80 year old male with a history of obstructive sleep apnea on CPAP.  He reports that the CPAP continues to work well for him.  He continues to see the benefit.  He returns today for an evaluation.    I   05/05/20: 6 Jonathan Lane is a 80 year old male with a history of obstructive sleep apnea on CPAP.  He returns today for follow-up.  His download indicates that he uses machine nightly for compliance of 100%.  He uses machine greater than 4 hours each night.  On average he uses his machine 7 hours and 48 minutes.  His residual AHI is 1.4 on 14 cm of water with EPR 3.  Leak in the 95th percentile is 45.6 L/min.  He reports that his straps give out before he is able to get new ones.  This causes his leak.  He returns today for an evaluation.  HISTORY 05/08/19:  Jonathan Lane is a 80 year old male with a history of obstructive sleep apnea on CPAP.  He returns today for a virtual visit.  His download indicates that he uses machine nightly for compliance of 100%.  He uses machine greater than 4 hours each night.  On average he uses his machine 7 hours and 42 minutes.  His residual AHI is 1.1 on 4 to 10 cm of water with EPR 3.  He does have a leak in the 95th percentile at 45.6 L/min.  He states he is currently wearing the nasal pillows.  He states he is probably due for new supplies.  He also reports that he does feel the mask leaking.  He joins me today for a virtual visit  REVIEW OF SYSTEMS: Out of a complete 14 system review of symptoms, the patient complains only of the following symptoms, and all other reviewed systems are negative.  ESS 2 FSS 12  ALLERGIES: Allergies  Allergen Reactions  . Brimonidine Tartrate-Timolol Itching and Rash    Itching eyes Itching eyes, rash Itching eyes, rash Itching eyes, rash    HOME  MEDICATIONS: Outpatient Medications Prior to Visit  Medication Sig Dispense Refill  . Apoaequorin (PREVAGEN PO) Take 1 capsule by mouth daily. Focus [facility] and memory.    Marland Kitchen atorvastatin (LIPITOR) 20 MG tablet Take 1 tablet (20 mg total) by mouth daily. 30 tablet 0  . clopidogrel (PLAVIX) 75 MG tablet Take 75 mg by mouth daily.    . Coenzyme Q10 (CO Q 10 PO) Take 1 capsule by mouth daily.     . COSOPT PF 22.3-6.8 MG/ML SOLN Place 1 drop into both eyes 2 (two) times daily.     . Cranberry 400 MG CAPS Take 1 capsule by mouth daily.    . Cyanocobalamin (VITAMIN B 12 PO) Take by mouth daily.    Marland Kitchen diltiazem (DILACOR XR) 240 MG 24 hr capsule Take 1 capsule (240 mg total) by mouth daily. 90 capsule 2  . esomeprazole (NEXIUM) 20 MG capsule Take 20 mg by mouth 2 (two) times daily before a meal.    . fish oil-omega-3 fatty acids 1000 MG capsule Take 1 g by mouth 2 (two) times daily.     . Glucosamine-Chondroitin (MOVE FREE PO) Take 1 tablet by mouth daily.    Marland Kitchen losartan-hydrochlorothiazide (HYZAAR) 100-25 MG tablet TAKE 1 TABLET ONCE DAILY. 90 tablet 2  .  Misc Natural Products (PROSTATE HEALTH) CAPS Take 1 capsule by mouth daily.    . multivitamin (THERAGRAN) per tablet Take 1 tablet by mouth daily.    . nitroGLYCERIN (NITROSTAT) 0.4 MG SL tablet Place 1 tablet (0.4 mg total) under the tongue every 5 (five) minutes as needed for chest pain. 10 tablet 1  . oxymetazoline (AFRIN) 0.05 % nasal spray Place 1 spray into both nostrils daily as needed for congestion.    . Probiotic Product (ALIGN PO) Take 1 tablet by mouth daily. Take daily    . RHOPRESSA 0.02 % SOLN Place 1 drop into both eyes at bedtime.     . valACYclovir (VALTREX) 1000 MG tablet Take 1,000 mg by mouth daily as needed (outbreak).     . Zinc 25 MG TABS Take by mouth.    . diltiazem (CARDIZEM CD) 240 MG 24 hr capsule TAKE (1) CAPSULE DAILY. 90 capsule 3   No facility-administered medications prior to visit.    PAST MEDICAL HISTORY: Past  Medical History:  Diagnosis Date  . AAA (abdominal aortic aneurysm) ([place])   . Atrial fibrillation ([place])   . Bradycardia   . Bradycardia   . Cardiac murmur   . Dizziness   . GERD (gastroesophageal reflux disease)   . Glaucoma   . Heart palpitations   . Hyperlipidemia   . Hypertension   . Numbness 08/26/2019   LEFT FACE  . Skin abnormalities    pre cancerous lesion R hand  . Sleep apnea    uses cpap  . Snoring     PAST SURGICAL HISTORY: Past Surgical History:  Procedure Laterality Date  . APPENDECTOMY  1965  . COLONOSCOPY    . POLYPECTOMY    . TONSILLECTOMY     as a child    FAMILY HISTORY: Family History  Problem Relation Age of Onset  . Stroke Father   . Stroke Mother   . Stroke Sister   . Breast cancer Sister   . High blood pressure Sister   . Diabetes Sister   . Colon cancer Other        Uncle  . Colon cancer Paternal Uncle   . Mitral valve prolapse Other        sugery 09-27-2017  . Rectal cancer Neg Hx   . Stomach cancer Neg Hx     SOCIAL HISTORY: Social History   Socioeconomic History  . Marital status: Married    Spouse name: [name]  . Number of children: 1  . Years of education: Masters  . Highest education level: Not on file  Occupational History    Employer: RETIRED  Tobacco Use  . Smoking status: Former Smoker    Types: Cigarettes    Quit date: 12/18/1965    Years since quitting: 55.4  . Smokeless tobacco: Never Used  Vaping Use  . Vaping Use: Never used  Substance and Sexual Activity  . Alcohol use: Yes    Comment: 5-7 drinks per week  . Drug use: No  . Sexual activity: Not on file  Other Topics Concern  . Not on file  Social History Narrative   Patient is married [name]).   Patient is retired.   Patient has one adult child.   Patient does not drink any caffeine.   Patient is right-handed.   Patient has a Scientist, water quality.            Social Determinants of Health   Financial Resource Strain: Not on file  Food Insecurity:  Not on file  Transportation Needs: Not on file  Physical Activity: Not on file  Stress: Not on file  Social Connections: Not on file  Intimate Partner Violence: Not on file      PHYSICAL EXAM  Vitals:   05/11/21 1117  BP: 117/65  Pulse: (!) 55  Weight: 196 lb (88.9 kg)  Height: 6\' 2"  (1.88 m)   Body mass index is 25.16 kg/m.  Generalized: Well developed, in no acute distress  Chest: Lungs clear to auscultation bilaterally  Neurological examination  Mentation: Alert oriented to time, place, history taking. Follows all commands speech and language fluent Cranial nerve II-XII: Extraocular movements were full, visual field were full on confrontational test Head turning and shoulder shrug  were normal and symmetric. Motor: The motor testing reveals 5 over 5 strength of all 4 extremities. Good symmetric motor tone is noted throughout.  Sensory: Sensory testing is intact to soft touch on all 4 extremities. No evidence of extinction is noted.  Gait and station: Gait is normal.    DIAGNOSTIC DATA (LABS, IMAGING, TESTING) - I reviewed patient records, labs, notes, testing and imaging myself where available.  Lab Results  Component Value Date   WBC 7.4 09/26/2019   HGB 13.2 09/26/2019   HCT 40.8 09/26/2019   MCV 99.0 09/26/2019   PLT 197 09/26/2019      Component Value Date/Time   NA 138 03/10/2020 1043   K 4.1 03/10/2020 1043   CL 99 03/10/2020 1043   CO2 25 03/10/2020 1043   GLUCOSE 108 (H) 03/10/2020 1043   GLUCOSE 107 (H) 09/26/2019 0517   BUN 21 03/10/2020 1043   CREATININE 0.89 03/10/2020 1043   CALCIUM 9.4 03/10/2020 1043   PROT 6.4 (L) 09/26/2019 0517   ALBUMIN 3.6 09/26/2019 0517   AST 26 09/26/2019 0517   ALT 20 09/26/2019 0517   ALKPHOS 80 09/26/2019 0517   BILITOT 1.1 09/26/2019 0517   GFRNONAA 81 03/10/2020 1043   GFRAA 94 03/10/2020 1043   Lab Results  Component Value Date   CHOL 123 08/18/2019   HDL 37 (L) 08/18/2019   LDLCALC 75 08/18/2019    TRIG 54 08/18/2019   CHOLHDL 3.3 08/18/2019   Lab Results  Component Value Date   HGBA1C 5.6 08/26/2019     ASSESSMENT AND PLAN 80 y.o. year old male  has a past medical history of AAA (abdominal aortic aneurysm) ([place]), Atrial fibrillation ([place]), Bradycardia, Bradycardia, Cardiac murmur, Dizziness, GERD (gastroesophageal reflux disease), Glaucoma, Heart palpitations, Hyperlipidemia, Hypertension, Numbness (08/26/2019), Skin abnormalities, Sleep apnea, and Snoring. here with:  1. OSA on CPAP  - CPAP compliance excellent - Good treatment of AHI  - Encourage patient to use CPAP nightly and > 4 hours each night - F/U in 1 year or sooner if needed   I spent 20 minutes of face-to-face and non-face-to-face time with patient.  This included previsit chart review, lab review, study review, order entry, electronic health record documentation, patient education.  Ward Givens, MSN, NP-C 05/11/2021, 11:34 AM [facility] [address] [place], [place] [address] [phone]

## 2021-05-17 DIAGNOSIS — F411 Generalized anxiety disorder: Secondary | ICD-10-CM | POA: Diagnosis not present

## 2021-05-17 DIAGNOSIS — I10 Essential (primary) hypertension: Secondary | ICD-10-CM | POA: Diagnosis not present

## 2021-05-17 DIAGNOSIS — R7309 Other abnormal glucose: Secondary | ICD-10-CM | POA: Diagnosis not present

## 2021-05-17 DIAGNOSIS — J310 Chronic rhinitis: Secondary | ICD-10-CM | POA: Diagnosis not present

## 2021-05-24 DIAGNOSIS — H35371 Puckering of macula, right eye: Secondary | ICD-10-CM | POA: Diagnosis not present

## 2021-05-24 DIAGNOSIS — H35431 Paving stone degeneration of retina, right eye: Secondary | ICD-10-CM | POA: Diagnosis not present

## 2021-05-24 DIAGNOSIS — H353211 Exudative age-related macular degeneration, right eye, with active choroidal neovascularization: Secondary | ICD-10-CM | POA: Diagnosis not present

## 2021-05-24 DIAGNOSIS — H353223 Exudative age-related macular degeneration, left eye, with inactive scar: Secondary | ICD-10-CM | POA: Diagnosis not present

## 2021-06-24 DIAGNOSIS — H43813 Vitreous degeneration, bilateral: Secondary | ICD-10-CM | POA: Diagnosis not present

## 2021-06-24 DIAGNOSIS — H353223 Exudative age-related macular degeneration, left eye, with inactive scar: Secondary | ICD-10-CM | POA: Diagnosis not present

## 2021-06-24 DIAGNOSIS — H35431 Paving stone degeneration of retina, right eye: Secondary | ICD-10-CM | POA: Diagnosis not present

## 2021-06-24 DIAGNOSIS — H353221 Exudative age-related macular degeneration, left eye, with active choroidal neovascularization: Secondary | ICD-10-CM | POA: Diagnosis not present

## 2021-07-29 DIAGNOSIS — H353211 Exudative age-related macular degeneration, right eye, with active choroidal neovascularization: Secondary | ICD-10-CM | POA: Diagnosis not present

## 2021-07-29 DIAGNOSIS — H35371 Puckering of macula, right eye: Secondary | ICD-10-CM | POA: Diagnosis not present

## 2021-07-29 DIAGNOSIS — H43813 Vitreous degeneration, bilateral: Secondary | ICD-10-CM | POA: Diagnosis not present

## 2021-07-29 DIAGNOSIS — H353223 Exudative age-related macular degeneration, left eye, with inactive scar: Secondary | ICD-10-CM | POA: Diagnosis not present

## 2021-09-07 DIAGNOSIS — H353223 Exudative age-related macular degeneration, left eye, with inactive scar: Secondary | ICD-10-CM | POA: Diagnosis not present

## 2021-09-07 DIAGNOSIS — H353211 Exudative age-related macular degeneration, right eye, with active choroidal neovascularization: Secondary | ICD-10-CM | POA: Diagnosis not present

## 2021-09-07 DIAGNOSIS — H43813 Vitreous degeneration, bilateral: Secondary | ICD-10-CM | POA: Diagnosis not present

## 2021-09-07 DIAGNOSIS — H35371 Puckering of macula, right eye: Secondary | ICD-10-CM | POA: Diagnosis not present

## 2021-09-20 DIAGNOSIS — H353230 Exudative age-related macular degeneration, bilateral, stage unspecified: Secondary | ICD-10-CM | POA: Diagnosis not present

## 2021-09-20 DIAGNOSIS — H40003 Preglaucoma, unspecified, bilateral: Secondary | ICD-10-CM | POA: Diagnosis not present

## 2021-10-27 ENCOUNTER — Telehealth: Payer: Self-pay | Admitting: Cardiovascular Disease

## 2021-10-27 MED ORDER — LOSARTAN POTASSIUM-HCTZ 100-25 MG PO TABS: 1.0000 | ORAL_TABLET | Freq: Every day | ORAL | 0 refills | Status: DC

## 2021-10-27 MED ORDER — DILTIAZEM HCL ER 240 MG PO CP24: 240.0000 mg | ORAL_CAPSULE | Freq: Every day | ORAL | 0 refills | Status: DC

## 2021-10-27 NOTE — Telephone Encounter (Signed)
Pt's medications were sent to pt's pharmacy as requested. Confirmation received.  

## 2021-10-27 NOTE — Telephone Encounter (Signed)
Patient is switching pharmacy to [facility]   *STAT* If patient is at the pharmacy, call can be transferred to refill team.   1. Which medications need to be refilled? (please list name of each medication and dose if known)  diltiazem (DILACOR XR) 240 MG 24 hr capsule losartan-hydrochlorothiazide (HYZAAR) 100-25 MG tablet  2. Which pharmacy/location (including street and city if local pharmacy) is medication to be sent to?[facility] [address], [place] [address]  3. Do they need a 30 day or 90 day supply? 90 day   Patient is switching pharmacy to [facility]

## 2021-10-28 DIAGNOSIS — G4700 Insomnia, unspecified: Secondary | ICD-10-CM | POA: Diagnosis not present

## 2021-10-28 DIAGNOSIS — G459 Transient cerebral ischemic attack, unspecified: Secondary | ICD-10-CM | POA: Diagnosis not present

## 2021-10-28 DIAGNOSIS — K219 Gastro-esophageal reflux disease without esophagitis: Secondary | ICD-10-CM | POA: Diagnosis not present

## 2021-10-28 DIAGNOSIS — I10 Essential (primary) hypertension: Secondary | ICD-10-CM | POA: Diagnosis not present

## 2021-10-28 DIAGNOSIS — E782 Mixed hyperlipidemia: Secondary | ICD-10-CM | POA: Diagnosis not present

## 2021-10-28 DIAGNOSIS — N400 Enlarged prostate without lower urinary tract symptoms: Secondary | ICD-10-CM | POA: Diagnosis not present

## 2021-11-02 DIAGNOSIS — H353211 Exudative age-related macular degeneration, right eye, with active choroidal neovascularization: Secondary | ICD-10-CM | POA: Diagnosis not present

## 2021-11-02 DIAGNOSIS — H353223 Exudative age-related macular degeneration, left eye, with inactive scar: Secondary | ICD-10-CM | POA: Diagnosis not present

## 2021-11-02 DIAGNOSIS — H43813 Vitreous degeneration, bilateral: Secondary | ICD-10-CM | POA: Diagnosis not present

## 2021-11-02 DIAGNOSIS — H35371 Puckering of macula, right eye: Secondary | ICD-10-CM | POA: Diagnosis not present

## 2021-11-03 NOTE — Progress Notes (Signed)
Cardiology Office Note   Date:  11/15/2021   ID:  Jonathan Lane, DOB Jul 10, 1941, MRN [id number]  PCP:  Glenis Smoker, MD  Cardiologist:  Dr. Johnsie Cancel, MD   No chief complaint on file.   History of Present Illness: Jonathan Lane is a 80 y.o. male with a hx of atypical chest pain, HTN, bradycardia, OSA with CPAP use and palpitations with no documented AF who was seen in hospital consultation 08/18/2019 for the evaluation of chest pain His wife had passed away 3 days before this and he was under a lot of stress Had a prior myovue 03/26/18 that was normal  He r/o and was d/c   He then presented 08/25/2019 with left-sided facial numbness and left upper arm numbness that began on morning of presentation who was seen by neurology and found to have TIA.  MRI was normal  CTA with vertebral disease and distal left V4 segment proximal to VB junction  Dr. Erlinda Hong recommended ASA and Plavix for 3 weeks followed by Plavix alone.  His Lipitor was increased from 10 to 20 mg daily for secondary stroke prevention.  Also with recommendations for 30-day event monitor This was done 10/27/19 and no significant arrhythmias noted   TTE 08/18/19 EF >65% no significant valve disease   Wife died unexpectedly 08-31-2019  Appears she was admitted with sepsis And multi organ failure. He was not happy with her care and felt she was transferred to the ICU too late.   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 Stable with no claudication Due for f/u US May 2023   Has a girlfriend now Seems very happy Continue to have some knee issues   Past Medical History:  Diagnosis Date   AAA (abdominal aortic aneurysm)    Atrial fibrillation (HCC)    Bradycardia    Bradycardia    Cardiac murmur    Dizziness    GERD (gastroesophageal reflux disease)    Glaucoma    Heart  palpitations    Hyperlipidemia    Hypertension    Numbness 08/26/2019   LEFT FACE   Skin abnormalities    pre cancerous lesion R hand   Sleep apnea    uses cpap   Snoring     Past Surgical History:  Procedure Laterality Date   [place]     as a child     Current Outpatient Medications  Medication Sig Dispense Refill   Apoaequorin (PREVAGEN PO) Take 1 capsule by mouth daily. [facility].     atorvastatin (LIPITOR) 20 MG tablet Take 1 tablet (20 mg total) by mouth daily. 30 tablet 0   clopidogrel (PLAVIX) 75 MG tablet Take 75 mg by mouth daily.     Coenzyme Q10 (CO Q 10 PO) Take 1 capsule by mouth daily.      COSOPT PF 22.3-6.8 MG/ML SOLN Place 1 drop into both eyes 2 (two) times daily.      Cranberry 400 MG CAPS Take 1 capsule by mouth daily.     Cyanocobalamin (VITAMIN B 12 PO) Take by mouth daily.     diltiazem (DILACOR XR) 240 MG 24 hr capsule Take 1 capsule (240 mg total) by mouth daily. 90 capsule 0  esomeprazole (NEXIUM) 20 MG capsule Take 20 mg by mouth 2 (two) times daily before a meal.     fish oil-omega-3 fatty acids 1000 MG capsule Take 1 g by mouth 2 (two) times daily.      Glucosamine-Chondroitin (MOVE FREE PO) Take 1 tablet by mouth daily.     losartan-hydrochlorothiazide (HYZAAR) 100-25 MG tablet Take 1 tablet by mouth daily. 90 tablet 0   Misc Natural Products (PROSTATE HEALTH) CAPS Take 1 capsule by mouth daily.     multivitamin (THERAGRAN) per tablet Take 1 tablet by mouth daily.     nitroGLYCERIN (NITROSTAT) 0.4 MG SL tablet Place 1 tablet (0.4 mg total) under the tongue every 5 (five) minutes as needed for chest pain. 10 tablet 1   oxymetazoline (AFRIN) 0.05 % nasal spray Place 1 spray into both nostrils daily as needed for congestion.     Probiotic Product (ALIGN PO) Take 1 tablet by mouth daily. Take daily     RHOPRESSA 0.02 % SOLN Place 1 drop into both eyes at bedtime.       valACYclovir (VALTREX) 1000 MG tablet Take 1,000 mg by mouth daily as needed (outbreak).      Zinc 25 MG TABS Take by mouth.     No current facility-administered medications for this visit.    Allergies:   Brimonidine tartrate-timolol    Social History:  The patient  reports that he quit smoking about 55 years ago. His smoking use included cigarettes. He has never used smokeless tobacco. He reports current alcohol use. He reports that he does not use drugs.   Family History:  The patient's family history includes Breast cancer in his sister; Colon cancer in his paternal uncle and another family member; Diabetes in his sister; High blood pressure in his sister; Mitral valve prolapse in an other family member; Stroke in his father, mother, and sister.    ROS:  Please see the history of present illness.  Otherwise, review of systems are positive for none.   All other systems are reviewed and negative.    PHYSICAL EXAM: VS:  BP 134/80   Pulse (!) 52   Ht 6\' 1"  (1.854 m)   Wt 196 lb (88.9 kg)   SpO2 96%   BMI 25.86 kg/m  , BMI Body mass index is 25.86 kg/m.   Affect appropriate Healthy:  appears stated age 3: normal Neck supple with no adenopathy JVP normal no bruits no thyromegaly Lungs clear with no wheezing and good diaphragmatic motion Heart:  S1/S2 no murmur, no rub, gallop or click PMI normal Abdomen: benighn, BS positve, no tenderness, no AAA no bruit.  No HSM or HJR Distal pulses intact with no bruits No edema Neuro non-focal Skin warm and dry No muscular weakness  E KG:   11/15/2021 SB rate 52 normal    Recent Labs: No results found for requested labs within last 8760 hours.    Lipid Panel    Component Value Date/Time   CHOL 123 08/18/2019 0550   TRIG 54 08/18/2019 0550   HDL 37 (L) 08/18/2019 0550   CHOLHDL 3.3 08/18/2019 0550   VLDL 11 08/18/2019 0550   LDLCALC 75 08/18/2019 0550      Wt Readings from Last 3 Encounters:  11/15/21 196 lb (88.9  kg)  05/11/21 196 lb (88.9 kg)  05/04/21 197 lb (89.4 kg)     Other studies Reviewed: Additional studies/ records that were reviewed today include:   Myoview stress test 03/26/2018:  Nuclear stress EF: 71%. The study is normal. This is a low risk study. The left ventricular ejection fraction is hyperdynamic (>65%). There was no ST segment deviation noted during stress. BP did drop in Stage 3 but likely erroneous as he had a hypertensive BP response in recovery.  Echocardiogram 08/18/2019:   1. The left ventricle has hyperdynamic systolic function, with an ejection fraction of >65%. The cavity size was normal. There is moderately increased left ventricular wall thickness. Left ventricular diastolic Doppler parameters are consistent with  impaired relaxation.  2. The right ventricle has normal systolic function. The cavity was normal. There is no increase in right ventricular wall thickness.  3. Mild calcification of the mitral valve leaflet. There is mild mitral annular calcification present. No evidence of mitral valve stenosis. No significant mitral regurgitation.  4. The aortic valve is tricuspid. Mild calcification of the aortic valve. No stenosis of the aortic valve.  5. The inferior vena cava was dilated in size with <50% respiratory variability. No complete TR doppler jet so unable to estimate PA systolic pressure.  6. The aortic root is normal in size and structure.  7. Trivial pericardial effusion is present.  ASSESSMENT AND PLAN:  1. Atypical chest pain: - with normal myovue 03/26/18 on plavix and statin     2. HTN: - Well controlled.  Continue current medications and low sodium Dash type diet.     3. Hx of palpitations: -negative monitor 10/27/19 on cardizem    4. HLD: on statin last LDL 08/18/19 was 75    -  6.  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 TIA:  F/u neurology no PaF on monitor no large vessel ICA stenosis on CTA  Continue plavix MRI was negative   8.  OSA:  continue DPAP f/u [place]    Current medicines are reviewed at length with the patient today.  The patient does not have concerns regarding medicines.  The following changes have been made:  no change  Labs/ tests ordered today include:  None    Orders Placed This Encounter  Procedures   EKG 12-Lead     Disposition:   FU VVS for iliac aneurysm/stenosis f/u with me in a year   Signed, [name], MD  11/15/2021 9:19 AM    [facility] [address], [place], [place]  [address] Phone: [phone]; Fax: [phone]

## 2021-11-09 NOTE — Telephone Encounter (Signed)
Received a notification indicating that the DME company has attempted to reach out to the pt to get set up with new machine.   "We have made several attempts to set this patient up for their PAP without success. Voiding this order."

## 2021-11-15 ENCOUNTER — Encounter: Payer: Self-pay | Admitting: Cardiovascular Disease

## 2021-11-15 ENCOUNTER — Ambulatory Visit: Payer: PPO | Admitting: Cardiovascular Disease

## 2021-11-15 ENCOUNTER — Other Ambulatory Visit: Payer: Self-pay

## 2021-11-15 VITALS — BP 134/80 | HR 52 | Ht 73.0 in | Wt 196.0 lb

## 2021-11-15 DIAGNOSIS — R079 Chest pain, unspecified: Secondary | ICD-10-CM | POA: Diagnosis not present

## 2021-11-15 DIAGNOSIS — I10 Essential (primary) hypertension: Secondary | ICD-10-CM

## 2021-11-15 DIAGNOSIS — R002 Palpitations: Secondary | ICD-10-CM | POA: Diagnosis not present

## 2021-11-15 DIAGNOSIS — I723 Aneurysm of iliac artery: Secondary | ICD-10-CM | POA: Diagnosis not present

## 2021-11-15 NOTE — Patient Instructions (Signed)
Medication Instructions:  Your physician recommends that you continue on your current medications as directed. Please refer to the Current Medication list given to you today.  *If you need a refill on your cardiac medications before your next appointment, please call your pharmacy*   Lab Work: None  If you have labs (blood work) drawn today and your tests are completely normal, you will receive your results only by: [place] (if you have MyChart) OR A paper copy in the mail If you have any lab test that is abnormal or we need to change your treatment, we will call you to review the results.   Testing/Procedures: None  Follow-Up: At [facility], you and your health needs are our priority.  As part of our continuing mission to provide you with exceptional heart care, we have created designated Provider Care Teams.  These Care Teams include your primary Cardiologist (physician) and Advanced Practice Providers (APPs -  Physician Assistants and Nurse Practitioners) who all work together to provide you with the care you need, when you need it.  We recommend signing up for the patient portal called "MyChart".  Sign up information is provided on this After Visit Summary.  MyChart is used to connect with patients for Virtual Visits (Telemedicine).  Patients are able to view lab/test results, encounter notes, upcoming appointments, etc.  Non-urgent messages can be sent to your provider as well.   To learn more about what you can do with MyChart, go to [email or web address].    Your next appointment:   12 month(s)  The format for your next appointment:   In Person  Provider:   [name], MD  or [address], PA-C, Nicholes Rough, PA-C, Melina Copa, PA-C, Ermalinda Barrios, PA-C, Christen Bame, NP, or Richardson Dopp, PA-C     :1}    Other Instructions None

## 2021-11-21 ENCOUNTER — Telehealth: Payer: Self-pay | Admitting: Adult Health

## 2021-11-21 DIAGNOSIS — M2610 Unspecified anomaly of jaw-cranial base relationship: Secondary | ICD-10-CM | POA: Diagnosis not present

## 2021-11-21 DIAGNOSIS — G4733 Obstructive sleep apnea (adult) (pediatric): Secondary | ICD-10-CM | POA: Diagnosis not present

## 2021-11-21 NOTE — Telephone Encounter (Signed)
Pt has had their Cpap machine replaced and is needing an Initial Cpap appointment. Called and LVM for pt to call back and schedule.   Pt is needing appt scheduled between 12/22/21-02/19/22

## 2021-11-25 DIAGNOSIS — N529 Male erectile dysfunction, unspecified: Secondary | ICD-10-CM | POA: Diagnosis not present

## 2021-11-25 DIAGNOSIS — I700 Atherosclerosis of aorta: Secondary | ICD-10-CM | POA: Diagnosis not present

## 2021-11-25 DIAGNOSIS — R7309 Other abnormal glucose: Secondary | ICD-10-CM | POA: Diagnosis not present

## 2021-11-25 DIAGNOSIS — I10 Essential (primary) hypertension: Secondary | ICD-10-CM | POA: Diagnosis not present

## 2021-11-25 DIAGNOSIS — I723 Aneurysm of iliac artery: Secondary | ICD-10-CM | POA: Diagnosis not present

## 2021-11-25 DIAGNOSIS — E782 Mixed hyperlipidemia: Secondary | ICD-10-CM | POA: Diagnosis not present

## 2021-11-25 DIAGNOSIS — M25561 Pain in right knee: Secondary | ICD-10-CM | POA: Diagnosis not present

## 2021-11-25 DIAGNOSIS — Z0000 Encounter for general adult medical examination without abnormal findings: Secondary | ICD-10-CM | POA: Diagnosis not present

## 2021-11-25 DIAGNOSIS — K219 Gastro-esophageal reflux disease without esophagitis: Secondary | ICD-10-CM | POA: Diagnosis not present

## 2021-11-25 DIAGNOSIS — N400 Enlarged prostate without lower urinary tract symptoms: Secondary | ICD-10-CM | POA: Diagnosis not present

## 2021-11-28 DIAGNOSIS — N342 Other urethritis: Secondary | ICD-10-CM | POA: Diagnosis not present

## 2021-11-28 DIAGNOSIS — N200 Calculus of kidney: Secondary | ICD-10-CM | POA: Diagnosis not present

## 2021-11-28 DIAGNOSIS — N401 Enlarged prostate with lower urinary tract symptoms: Secondary | ICD-10-CM | POA: Diagnosis not present

## 2021-11-28 DIAGNOSIS — R3121 Asymptomatic microscopic hematuria: Secondary | ICD-10-CM | POA: Diagnosis not present

## 2021-11-28 DIAGNOSIS — R3129 Other microscopic hematuria: Secondary | ICD-10-CM | POA: Diagnosis not present

## 2021-11-28 DIAGNOSIS — K5730 Diverticulosis of large intestine without perforation or abscess without bleeding: Secondary | ICD-10-CM | POA: Diagnosis not present

## 2021-11-28 DIAGNOSIS — K8020 Calculus of gallbladder without cholecystitis without obstruction: Secondary | ICD-10-CM | POA: Diagnosis not present

## 2021-12-01 DIAGNOSIS — N401 Enlarged prostate with lower urinary tract symptoms: Secondary | ICD-10-CM | POA: Diagnosis not present

## 2021-12-06 DIAGNOSIS — R3121 Asymptomatic microscopic hematuria: Secondary | ICD-10-CM | POA: Diagnosis not present

## 2021-12-06 DIAGNOSIS — C670 Malignant neoplasm of trigone of bladder: Secondary | ICD-10-CM | POA: Diagnosis not present

## 2021-12-07 ENCOUNTER — Other Ambulatory Visit: Payer: Self-pay | Admitting: Urology

## 2021-12-07 ENCOUNTER — Telehealth: Payer: Self-pay | Admitting: Cardiovascular Disease

## 2021-12-07 NOTE — Telephone Encounter (Signed)
° °  Primary Cardiologist: [name], MD  Chart reviewed as part of pre-operative protocol coverage. Given past medical history and time since last visit, based on ACC/AHA guidelines, [name] would be at acceptable risk for the planned procedure without further cardiovascular testing.   RCRI indicates class II risk, 0.9% of major cardiac event.   He is on Plavix for history of TIA, we will defer recommendations for holding Plavix prior to surgery to prescribing MD.   I will route this recommendation to the requesting party via [place] fax function and remove from pre-op pool.  Please call with questions.  Lenna Sciara, NP 12/07/2021, 4:46 PM

## 2021-12-07 NOTE — Telephone Encounter (Signed)
° °  Pre-operative Risk Assessment    Patient Name: Jonathan Lane  DOB: Mar 03, 1941 MRN: [id number]      Request for Surgical Clearance    Procedure:  transurethral resection of bladder tumor  Date of Surgery:  Clearance 01/03/21                                 Surgeon:  [name]    Surgeon's Group or Practice Name:  [facility] Phone number:  [phone] Fax number:  [phone]   Type of Clearance Requested:   - Medical  - Pharmacy:  Hold Clopidogrel (Plavix) 5 days prior   Type of Anesthesia:  General    Additional requests/questions:   n/a  Signed, Kamira J Martinique   12/07/2021, 4:27 PM

## 2021-12-08 DIAGNOSIS — G4700 Insomnia, unspecified: Secondary | ICD-10-CM | POA: Diagnosis not present

## 2021-12-08 DIAGNOSIS — E782 Mixed hyperlipidemia: Secondary | ICD-10-CM | POA: Diagnosis not present

## 2021-12-08 DIAGNOSIS — K219 Gastro-esophageal reflux disease without esophagitis: Secondary | ICD-10-CM | POA: Diagnosis not present

## 2021-12-08 DIAGNOSIS — I10 Essential (primary) hypertension: Secondary | ICD-10-CM | POA: Diagnosis not present

## 2021-12-08 DIAGNOSIS — N400 Enlarged prostate without lower urinary tract symptoms: Secondary | ICD-10-CM | POA: Diagnosis not present

## 2021-12-08 DIAGNOSIS — G459 Transient cerebral ischemic attack, unspecified: Secondary | ICD-10-CM | POA: Diagnosis not present

## 2021-12-16 ENCOUNTER — Other Ambulatory Visit: Payer: Self-pay | Admitting: Cardiovascular Disease

## 2021-12-18 ENCOUNTER — Encounter (HOSPITAL_COMMUNITY): Payer: Self-pay

## 2021-12-18 ENCOUNTER — Emergency Department (HOSPITAL_COMMUNITY): Payer: PPO

## 2021-12-18 ENCOUNTER — Emergency Department (HOSPITAL_COMMUNITY)
Admission: EM | Admit: 2021-12-18 | Discharge: 2021-12-18 | Disposition: A | Discharge status: Home / Self Care | Payer: PPO | Attending: Emergency Medicine | Admitting: Emergency Medicine

## 2021-12-18 ENCOUNTER — Other Ambulatory Visit: Payer: Self-pay

## 2021-12-18 DIAGNOSIS — R059 Cough, unspecified: Secondary | ICD-10-CM | POA: Diagnosis present

## 2021-12-18 DIAGNOSIS — U071 COVID-19: Secondary | ICD-10-CM | POA: Diagnosis not present

## 2021-12-18 DIAGNOSIS — Z7902 Long term (current) use of antithrombotics/antiplatelets: Secondary | ICD-10-CM | POA: Insufficient documentation

## 2021-12-18 DIAGNOSIS — R0602 Shortness of breath: Secondary | ICD-10-CM | POA: Diagnosis not present

## 2021-12-18 LAB — RESP PANEL BY RT-PCR (FLU A&B, COVID) ARPGX2
Influenza A by PCR: NEGATIVE
Influenza B by PCR: NEGATIVE
SARS Coronavirus 2 by RT PCR: POSITIVE — AB

## 2021-12-18 LAB — CBC WITH DIFFERENTIAL/PLATELET
Abs Immature Granulocytes: 0.02 10*3/uL (ref 0.00–0.07)
Basophils Absolute: 0 10*3/uL (ref 0.0–0.1)
Basophils Relative: 0 %
Eosinophils Absolute: 0.1 10*3/uL (ref 0.0–0.5)
Eosinophils Relative: 1 %
HCT: 42.3 % (ref 39.0–52.0)
Hemoglobin: 14.3 g/dL (ref 13.0–17.0)
Immature Granulocytes: 0 %
Lymphocytes Relative: 9 %
Lymphs Abs: 0.8 10*3/uL (ref 0.7–4.0)
MCH: 32.7 pg (ref 26.0–34.0)
MCHC: 33.8 g/dL (ref 30.0–36.0)
MCV: 96.8 fL (ref 80.0–100.0)
Monocytes Absolute: 1.2 10*3/uL — ABNORMAL HIGH (ref 0.1–1.0)
Monocytes Relative: 14 %
Neutro Abs: 6.3 10*3/uL (ref 1.7–7.7)
Neutrophils Relative %: 76 %
Platelets: 171 10*3/uL (ref 150–400)
RBC: 4.37 MIL/uL (ref 4.22–5.81)
RDW: 13.7 % (ref 11.5–15.5)
WBC: 8.3 10*3/uL (ref 4.0–10.5)
nRBC: 0 % (ref 0.0–0.2)

## 2021-12-18 LAB — COMPREHENSIVE METABOLIC PANEL
ALT: 22 U/L (ref 0–44)
AST: 25 U/L (ref 15–41)
Albumin: 4 g/dL (ref 3.5–5.0)
Alkaline Phosphatase: 89 U/L (ref 38–126)
Anion gap: 8 (ref 5–15)
BUN: 14 mg/dL (ref 8–23)
CO2: 25 mmol/L (ref 22–32)
Calcium: 8.9 mg/dL (ref 8.9–10.3)
Chloride: 103 mmol/L (ref 98–111)
Creatinine, Ser: 0.64 mg/dL (ref 0.61–1.24)
GFR, Estimated: >60 mL/min (ref >60)
Glucose, Bld: 114 mg/dL — ABNORMAL HIGH (ref 70–99)
Potassium: 3.6 mmol/L (ref 3.5–5.1)
Sodium: 136 mmol/L (ref 135–145)
Total Bilirubin: 0.6 mg/dL (ref 0.3–1.2)
Total Protein: 7.1 g/dL (ref 6.5–8.1)

## 2021-12-18 MED ORDER — NIRMATRELVIR/RITONAVIR (PAXLOVID)TABLET: 3.0000 | ORAL_TABLET | Freq: Two times a day (BID) | ORAL | 0 refills | Status: AC

## 2021-12-18 NOTE — ED Triage Notes (Signed)
Pt reports waking up around 0500 with his CPAP feeling shob and body aches and mild cough. Sts exposure at christmas to covid.

## 2021-12-18 NOTE — ED Notes (Signed)
Pt states understanding of dc instructions, importance of follow up, and prescription. Pt denies questions or concerns upon dc. Pt declined wheelchair assistance upon dc. Pt ambulated out of ed w/ steady gait. No belongings left in room upon dc.  

## 2021-12-18 NOTE — ED Notes (Signed)
Save blue tube in main lab °

## 2021-12-18 NOTE — ED Provider Notes (Signed)
Jonathan Lane Provider Note   CSN: [id number] Arrival date & time: 12/18/21  0541     History  Chief Complaint  Patient presents with   Covid Exposure    Jonathan Lane is a 81 y.o. male.  81 year old male presents with 1 day of cough and congestion.  Positive COVID exposure.  No fever.  Symptoms occurred when he was using his CPAP.  Felt he can clear his throat.  Symptoms resolved spontaneously.  Without treatment feels back to his baseline      Home Medications Prior to Admission medications   Medication Sig Start Date End Date Taking? Authorizing Provider  Apoaequorin (PREVAGEN PO) Take 1 capsule by mouth daily. [facility].    [provider]  atorvastatin (LIPITOR) 20 MG tablet Take 1 tablet (20 mg total) by mouth daily. 08/26/19   Domenic Polite, MD  clopidogrel (PLAVIX) 75 MG tablet Take 75 mg by mouth daily. 09/06/20   [provider]  Coenzyme Q10 (CO Q 10 PO) Take 1 capsule by mouth daily.     [provider]  COSOPT PF 22.3-6.8 MG/ML SOLN Place 1 drop into both eyes 2 (two) times daily.  12/28/11   [provider]  Cranberry 400 MG CAPS Take 1 capsule by mouth daily.    [provider]  Cyanocobalamin (VITAMIN B 12 PO) Take by mouth daily.    [provider]  DILT-XR 240 MG 24 hr capsule TAKE ONE CAPSULE BY MOUTH ONCE DAILY 12/16/21   Josue Hector, MD  esomeprazole (NEXIUM) 20 MG capsule Take 20 mg by mouth 2 (two) times daily before a meal.    [provider]  fish oil-omega-3 fatty acids 1000 MG capsule Take 1 g by mouth 2 (two) times daily.     [provider]  Glucosamine-Chondroitin (MOVE FREE PO) Take 1 tablet by mouth daily.    [provider]  losartan-hydrochlorothiazide (HYZAAR) 100-25 MG tablet Take 1 tablet by mouth daily. 10/27/21   Josue Hector, MD  Misc Natural Products [facility]) CAPS Take 1 capsule by mouth daily.     [provider]  multivitamin [facility]) per tablet Take 1 tablet by mouth daily.    [provider]  nitroGLYCERIN (NITROSTAT) 0.4 MG SL tablet Place 1 tablet (0.4 mg total) under the tongue every 5 (five) minutes as needed for chest pain. 08/18/19   Regalado, Belkys A, MD  oxymetazoline (AFRIN) 0.05 % nasal spray Place 1 spray into both nostrils daily as needed for congestion.    [provider]  Probiotic Product (ALIGN PO) Take 1 tablet by mouth daily. Take daily    [provider]  RHOPRESSA 0.02 % SOLN Place 1 drop into both eyes at bedtime.  01/08/18   [provider]  valACYclovir (VALTREX) 1000 MG tablet Take 1,000 mg by mouth daily as needed (outbreak).     [provider]  Zinc 25 MG TABS Take by mouth.    [provider]      Allergies    Brimonidine tartrate-timolol, Brimonidine tartrate, Ciprofloxacin, and Tetracycline hcl    Review of Systems   Review of Systems  All other systems reviewed and are negative.  Physical Exam Updated Vital Signs BP (!) 142/78 (BP Location: Right Arm)    Pulse 77    Temp 98.7 F (37.1 C) (Oral)    Resp 20    Ht 1.854 m (6\' 1" )  Wt 90.7 kg    SpO2 93%    BMI 26.39 kg/m  Physical Exam Vitals and nursing note reviewed.  Constitutional:      General: He is not in acute distress.    Appearance: Normal appearance. He is well-developed. He is not toxic-appearing.  HENT:     Head: Normocephalic and atraumatic.  Eyes:     General: Lids are normal.     Conjunctiva/sclera: Conjunctivae normal.     Pupils: Pupils are equal, round, and reactive to light.  Neck:     Thyroid: No thyroid mass.     Trachea: No tracheal deviation.  Cardiovascular:     Rate and Rhythm: Normal rate and regular rhythm.     Heart sounds: Normal heart sounds. No murmur heard.   No gallop.  Pulmonary:     Effort: Pulmonary effort is normal. No respiratory distress.     Breath sounds: Normal breath sounds. No  stridor. No decreased breath sounds, wheezing, rhonchi or rales.  Abdominal:     General: There is no distension.     Palpations: Abdomen is soft.     Tenderness: There is no abdominal tenderness. There is no rebound.  Musculoskeletal:        General: No tenderness. Normal range of motion.     Cervical back: Normal range of motion and neck supple.  Skin:    General: Skin is warm and dry.     Findings: No abrasion or rash.  Neurological:     Mental Status: He is alert and oriented to person, place, and time. Mental status is at baseline.     GCS: GCS eye subscore is 4. GCS verbal subscore is 5. GCS motor subscore is 6.     Cranial Nerves: No cranial nerve deficit.     Sensory: No sensory deficit.     Motor: Motor function is intact.  Psychiatric:        Attention and Perception: Attention normal.        Speech: Speech normal.        Behavior: Behavior normal.    ED Results / Procedures / Treatments   Labs (all labs ordered are listed, but only abnormal results are displayed) Labs Reviewed  CBC WITH DIFFERENTIAL/PLATELET - Abnormal; Notable for the following components:      Result Value   Monocytes Absolute 1.2 (*)    All other components within normal limits  COMPREHENSIVE METABOLIC PANEL - Abnormal; Notable for the following components:   Glucose, Bld 114 (*)    All other components within normal limits  RESP PANEL BY RT-PCR (FLU A&B, COVID) [id number]    EKG EKG Interpretation  Date/Time:  Sunday December 18 2021 06:34:28 EST Ventricular Rate:  59 PR Interval:  192 QRS Duration: 93 QT Interval:  395 QTC Calculation: 392 R Axis:   51 Text Interpretation: Sinus rhythm Low voltage, precordial leads Confirmed by Ripley Fraise [phone]) on 12/18/2021 7:00:20 AM  Radiology DG CHEST PORT 1 VIEW  Result Date: 12/18/2021 CLINICAL DATA:  COVID exposure.  Short of breath and body aches EXAM: PORTABLE CHEST 1 VIEW COMPARISON:  08/25/2019 FINDINGS: Normal heart size and mediastinal  contours. No acute infiltrate or edema. No effusion or pneumothorax. No acute osseous findings. IMPRESSION: Negative for pneumonia. Electronically Signed   By: Jorje Guild M.D.   On: 12/18/2021 07:24    Procedures Procedures  Patient's EKG shows no evidence of ischemia.  Medications Ordered in ED Medications - No data to display  ED Course/ Medical Decision Making/ A&P                           Medical Decision Making  Patient has positive COVID test here.  He has no indication for admission at this time.  Chest x-ray without acute findings here.  Stable hemoglobin. Patient's pulse oximetry is stable as he is not hypoxic.  No indication for admission at this time.  We will prescribe antivirals for patient to prevent him from requiring future admission.        Final Clinical Impression(s) / ED Diagnoses Final diagnoses:  COVID    Rx / DC Orders ED Discharge Orders     None         Lacretia Leigh, MD 12/18/21 [phone]

## 2021-12-22 DIAGNOSIS — M2610 Unspecified anomaly of jaw-cranial base relationship: Secondary | ICD-10-CM | POA: Diagnosis not present

## 2021-12-22 DIAGNOSIS — G4733 Obstructive sleep apnea (adult) (pediatric): Secondary | ICD-10-CM | POA: Diagnosis not present

## 2021-12-23 ENCOUNTER — Telehealth: Payer: Self-pay | Admitting: Cardiovascular Disease

## 2021-12-23 NOTE — Telephone Encounter (Signed)
Spoke with the patient and advised him on recommendations from Dr. Johnsie Cancel. Patient verbalized understanding.

## 2021-12-23 NOTE — Telephone Encounter (Signed)
STAT if HR is under 50 or over 120 (normal HR is 60-100 beats per minute)  What is your heart rate? 44  Do you have a log of your heart rate readings (document readings)? no  Do you have any other symptoms? Lightheadedness, fatigue

## 2021-12-23 NOTE — Telephone Encounter (Signed)
Spoke with the patient who states that he got a notification from his Apple watch that his heart rate was low. He checked and it was in the low 40s. He states that he has never had it go that low before. Patient does have a history of bradycardia with heart rates in the 50s. Patient states that he does have some lightheadedness and fatigue but does not feel like he is going to pass out. He also reports that he tested positive for COVID on 01/01 and has been on Paxlovid for 5 days. Current heart rate is 46. Advised patient to ensure he is staying hydrated. He states that he has been eating normally. He does have some Gatorade that I advised him to drink. He takes his diltiazem and Hyzaar at night so has not taken either medication yet today. Advised patient to rest and move positions slowly. Patient will continue to monitor and let us know if heart rate lowers/symptoms worsen. Will make Dr. Johnsie Cancel aware for any further recommendations.

## 2021-12-28 DIAGNOSIS — H353222 Exudative age-related macular degeneration, left eye, with inactive choroidal neovascularization: Secondary | ICD-10-CM | POA: Diagnosis not present

## 2021-12-28 DIAGNOSIS — H35431 Paving stone degeneration of retina, right eye: Secondary | ICD-10-CM | POA: Diagnosis not present

## 2021-12-28 DIAGNOSIS — H43813 Vitreous degeneration, bilateral: Secondary | ICD-10-CM | POA: Diagnosis not present

## 2021-12-28 DIAGNOSIS — H353211 Exudative age-related macular degeneration, right eye, with active choroidal neovascularization: Secondary | ICD-10-CM | POA: Diagnosis not present

## 2021-12-28 NOTE — Patient Instructions (Addendum)
DUE TO COVID-19 ONLY ONE VISITOR IS ALLOWED TO COME WITH YOU AND STAY IN THE WAITING ROOM ONLY DURING PRE OP AND PROCEDURE.   **NO VISITORS ARE ALLOWED IN THE SHORT STAY AREA OR RECOVERY ROOM!!**       Your procedure is scheduled on: 01/03/22   Report to [facility] Main Entrance    Report to admitting at 6:45 AM   Call this number if you have problems the morning of surgery [phone]   Do not eat food :After Midnight.   May have liquids until 6:00 AM day of surgery  CLEAR LIQUID DIET  Foods Allowed                                                                     Foods Excluded  Water, Black Coffee and tea (no milk or creamer)           liquids that you cannot  Plain Jell-O in any flavor  (No red)                                    see through such as: Fruit ices (not with fruit pulp)                                            milk, soups, orange juice              Iced Popsicles (No red)                                                All solid food                                   Apple juices Sports drinks like Gatorade (No red) Lightly seasoned clear broth or consume(fat free) Sugar   Oral Hygiene is also important to reduce your risk of infection.                                    Remember - BRUSH YOUR TEETH THE MORNING OF SURGERY WITH YOUR REGULAR TOOTHPASTE   Follow instructions given to you regarding Plavix.   Stop all vitamins and supplements 7 days before surgery.   Take these medicines the morning of surgery with A SIP OF WATER: Lipitor, DILT XR, Nexium                              You may not have any metal on your body including jewelry, and body piercing             Do not wear lotions, powders, cologne, or deodorant              Men may shave face and  neck.   Do not bring valuables to the hospital. [place].   Patients discharged on the day of surgery will not be allowed to drive home.   Someone needs to stay with you for the first 24 hours after anesthesia.   Special Instructions: Bring a copy of your healthcare power of attorney and living will documents         the day of surgery if you haven't scanned them before.              Please read over the following fact sheets you were given: IF YOU HAVE QUESTIONS ABOUT YOUR PRE-OP INSTRUCTIONS PLEASE CALL [facility] - Preparing for Surgery Before surgery, you can play an important role.  Because skin is not sterile, your skin needs to be as free of germs as possible.  You can reduce the number of germs on your skin by washing with CHG (chlorahexidine gluconate) soap before surgery.  CHG is an antiseptic cleaner which kills germs and bonds with the skin to continue killing germs even after washing. Please DO NOT use if you have an allergy to CHG or antibacterial soaps.  If your skin becomes reddened/irritated stop using the CHG and inform your nurse when you arrive at Short Stay. Do not shave (including legs and underarms) for at least 48 hours prior to the first CHG shower.  You may shave your face/neck.  Please follow these instructions carefully:  1.  Shower with CHG Soap the night before surgery and the  morning of surgery.  2.  If you choose to wash your hair, wash your hair first as usual with your normal  shampoo.  3.  After you shampoo, rinse your hair and body thoroughly to remove the shampoo.                             4.  Use CHG as you would any other liquid soap.  You can apply chg directly to the skin and wash.  Gently with a scrungie or clean washcloth.  5.  Apply the CHG Soap to your body ONLY FROM THE NECK DOWN.   Do   not use on face/ open                           Wound or open sores. Avoid contact with eyes, ears mouth and   genitals (private parts).                       Wash face,  Genitals (private parts) with your normal soap.             6.  Wash thoroughly, paying special attention  to the area where your    surgery  will be performed.  7.  Thoroughly rinse your body with warm water from the neck down.  8.  DO NOT shower/wash with your normal soap after using and rinsing off the CHG Soap.                9.  Pat yourself dry with a clean towel.            10.  Wear clean pajamas.            11.  Place clean sheets on your bed the night of your first shower and do not  sleep with pets. Day of Surgery : Do not apply any lotions/deodorants the morning of surgery.  Please wear clean clothes to the hospital/surgery center.  FAILURE TO FOLLOW THESE INSTRUCTIONS MAY RESULT IN THE CANCELLATION OF YOUR SURGERY  PATIENT SIGNATURE_________________________________  NURSE SIGNATURE__________________________________  ________________________________________________________________________

## 2021-12-28 NOTE — Progress Notes (Addendum)
COVID swab appointment: n/a  COVID Vaccine Completed: yes x5 Date COVID Vaccine completed: Has received booster: COVID vaccine manufacturer: [place]   Date of COVID positive in last 90 days: yes COVID + 12/18/21 in [place]  PCP - Sela Hilding, MD Cardiologist - [name], MD  Cardiac clearance by Diona Browner 12/07/21 in Epic  Chest x-ray - 12/18/21 Epic EKG - 12/20/21 Epic Stress Test - 03/26/18 Epic ECHO - 08/18/19 Epic Cardiac Cath - n/a Pacemaker/ICD device last checked: n/a Spinal Cord Stimulator: n/a  Sleep Study - yes, positive CPAP - yes, every night  Fasting Blood Sugar - n/a Checks Blood Sugar _____ times a day  Blood Thinner Instructions: Plavix, hold 5 days Aspirin Instructions: Last Dose: 12/28/21  Activity level: Can go up a flight of stairs and perform activities of daily living without stopping and without symptoms of chest pain or shortness of breath.    Anesthesia review: HTN, a fib, TIA, OSA, AAA  Patient denies shortness of breath, fever, cough and chest pain at PAT appointment   Patient verbalized understanding of instructions that were given to them at the PAT appointment. Patient was also instructed that they will need to review over the PAT instructions again at home before surgery.

## 2021-12-30 ENCOUNTER — Encounter (HOSPITAL_COMMUNITY)
Admission: RE | Admit: 2021-12-30 | Discharge: 2021-12-30 | Disposition: A | Discharge status: Home / Self Care | Payer: PPO | Source: Ambulatory Visit | Attending: Urology | Admitting: Urology

## 2021-12-30 ENCOUNTER — Encounter (HOSPITAL_COMMUNITY): Payer: Self-pay

## 2021-12-30 ENCOUNTER — Other Ambulatory Visit: Payer: Self-pay

## 2021-12-30 VITALS — BP 167/87 | HR 56 | Temp 98.5°F | Resp 16 | Ht 73.5 in | Wt 195.6 lb

## 2021-12-30 DIAGNOSIS — Z87891 Personal history of nicotine dependence: Secondary | ICD-10-CM | POA: Diagnosis not present

## 2021-12-30 DIAGNOSIS — Z8673 Personal history of transient ischemic attack (TIA), and cerebral infarction without residual deficits: Secondary | ICD-10-CM | POA: Insufficient documentation

## 2021-12-30 DIAGNOSIS — Z01812 Encounter for preprocedural laboratory examination: Secondary | ICD-10-CM | POA: Diagnosis not present

## 2021-12-30 DIAGNOSIS — I10 Essential (primary) hypertension: Secondary | ICD-10-CM | POA: Diagnosis not present

## 2021-12-30 DIAGNOSIS — Z7901 Long term (current) use of anticoagulants: Secondary | ICD-10-CM | POA: Insufficient documentation

## 2021-12-30 DIAGNOSIS — D494 Neoplasm of unspecified behavior of bladder: Secondary | ICD-10-CM | POA: Diagnosis not present

## 2022-01-02 NOTE — Anesthesia Preprocedure Evaluation (Addendum)
Anesthesia Evaluation  Patient identified by MRN, date of birth, ID band Patient awake    Reviewed: Allergy & Precautions, NPO status , Patient's Chart, lab work & pertinent test results  Airway Mallampati: II  TM Distance: >3 FB Neck ROM: Full    Dental no notable dental hx. (+) Teeth Intact, Dental Advisory Given   Pulmonary sleep apnea and Continuous Positive Airway Pressure Ventilation , former smoker,  New years day covid sx now   Pulmonary exam normal breath sounds clear to auscultation       Cardiovascular hypertension, Pt. on medications Normal cardiovascular exam Rhythm:Regular Rate:Normal  EF 65%   Neuro/Psych TIAnegative psych ROS   GI/Hepatic GERD  ,  Endo/Other    Renal/GU Renal diseaseLab Results      Component                Value               Date                      CREATININE               0.64                12/18/2021                BUN                      14                  12/18/2021                   K                        3.6                 12/18/2021                 Bladder dysfunction (CA)      Musculoskeletal   Abdominal   Peds  Hematology Lab Results      Component                Value               Date                        HGB                      14.3                12/18/2021                HCT                      42.3                12/18/2021                PLT                      171                 12/18/2021              Anesthesia Other Findings cipro tetracycline timolol  Reproductive/Obstetrics  Anesthesia Physical Anesthesia Plan  ASA: 3  Anesthesia Plan: General   Post-op Pain Management:    Induction: Intravenous  PONV Risk Score and Plan: Ondansetron and Treatment may vary due to age or medical condition  Airway Management Planned: Oral ETT  Additional Equipment: None  Intra-op Plan:    Post-operative Plan: Extubation in OR  Informed Consent: I have reviewed the patients History and Physical, chart, labs and discussed the procedure including the risks, benefits and alternatives for the proposed anesthesia with the patient or authorized representative who has indicated his/her understanding and acceptance.     Dental advisory given  Plan Discussed with: CRNA and Anesthesiologist  Anesthesia Plan Comments:        Anesthesia Quick Evaluation

## 2022-01-02 NOTE — Progress Notes (Signed)
Anesthesia Chart Review   Case: [id number] Date/Time: 01/03/22 0845   Procedure: CYSTOSCOPY TRANSURETHRAL RESECTION OF BLADDER TUMOR WITH POSSIBLE  GEMCITABINE BILATERAL RETROGRADES;POSSIBLE STENT PLACEMENT (Bilateral)   Anesthesia type: General   Pre-op diagnosis: bladder tumor   Location: [facility] PROC[facility] / [facility]   Surgeons: [name], MD       DISCUSSION:81 y.o. former smoker with h/o HTN, palpitations, TIA, sleep apnea, bladder tumor scheduled for above procedure 01/03/2022 with Dr. [name].   Chest/abdomen/pelvis CTA  08/18/19 with no dissection or other acute aortic changes. 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   Per cardiology preoperative evaluation 12/07/2021, "Chart reviewed as part of pre-operative protocol coverage. Given past medical history and time since last visit, based on ACC/AHA guidelines, Jonathan Lane would be at acceptable risk for the planned procedure without further cardiovascular testing.  RCRI indicates class II risk, 0.9% of major cardiac event.  He is on Plavix for history of TIA, we will defer recommendations for holding Plavix prior to surgery to prescribing MD."  Pt advised to hold Plavix 5 days prior to procedure.   Anticipate pt can proceed with planned procedure barring acute status change.   VS: BP (!) 167/87    Pulse (!) 56    Temp 36.9 C (Oral)    Resp 16    Ht 6' 1.5" (1.867 m)    Wt 88.7 kg    SpO2 98%    BMI 25.46 kg/m   PROVIDERS: Glenis Smoker, MD is PCP   Primary Cardiologist: [name], MD LABS: Labs reviewed: Acceptable for surgery. (all labs ordered are listed, but only abnormal results are  displayed)  Labs Reviewed - No data to display   IMAGES:   EKG: 12/18/2021 Rate 59 bpm  Sinus rhythm   CV: Echo 08/18/2019  1. The left ventricle has hyperdynamic systolic function, with an  ejection fraction of >65%. The cavity size was normal. There is moderately  increased left ventricular wall thickness. Left ventricular diastolic  Doppler parameters are consistent with  impaired relaxation.   2. The right ventricle has normal systolic function. The cavity was  normal. There is no increase in right ventricular wall thickness.   3. Mild calcification of the mitral valve leaflet. There is mild mitral  annular calcification present. No evidence of mitral valve stenosis. No  significant mitral regurgitation.   4. The aortic valve is tricuspid. Mild calcification of the aortic valve.  No stenosis of the aortic valve.   5. The inferior vena cava was dilated in size with <50% respiratory  variability. No complete TR doppler jet so unable to estimate PA systolic  pressure.   6. The aortic root is normal in size and structure.   7. Trivial pericardial effusion is present.   Myocardial Perfusion 03/26/2018 Nuclear stress EF: 71%. The study is normal. This is a low risk study. The left ventricular ejection fraction is hyperdynamic (>65%). There was no ST segment deviation noted during stress. BP did drop in Stage 3 but likely erroneous as he had a hypertensive BP response in recovery. Past Medical History:  Diagnosis Date   AAA (abdominal aortic aneurysm)  Atrial fibrillation (HCC)    Bradycardia    Bradycardia    Cardiac murmur    Dizziness    GERD (gastroesophageal reflux disease)    Glaucoma    Heart palpitations    Hyperlipidemia    Hypertension    Numbness 08/26/2019   LEFT FACE   Skin abnormalities    pre cancerous lesion R hand   Sleep apnea    uses cpap   Snoring     Past Surgical History:  Procedure Laterality Date   APPENDECTOMY  12/19/1963    COLONOSCOPY     POLYPECTOMY     SKIN CANCER EXCISION     TONSILLECTOMY     as a child   WISDOM TOOTH EXTRACTION      MEDICATIONS:  Apoaequorin (PREVAGEN PO)   atorvastatin (LIPITOR) 20 MG tablet   clopidogrel (PLAVIX) 75 MG tablet   Coenzyme Q10 (CO Q 10 PO)   COSOPT PF 22.3-6.8 MG/ML SOLN   Cranberry 400 MG CAPS   Cyanocobalamin (VITAMIN B 12 PO)   DILT-XR 240 MG 24 hr capsule   esomeprazole (NEXIUM) 20 MG capsule   fish oil-omega-3 fatty acids 1000 MG capsule   fluticasone (FLONASE) 50 MCG/ACT nasal spray   Glucosamine-Chondroitin (MOVE FREE PO)   losartan-hydrochlorothiazide (HYZAAR) 100-25 MG tablet   Misc Natural Products (PROSTATE HEALTH) CAPS   Multiple Vitamins-Minerals (PRESERVISION AREDS 2 PO)   multivitamin (THERAGRAN) per tablet   nitroGLYCERIN (NITROSTAT) 0.4 MG SL tablet   Probiotic Product (ALIGN PO)   RHOPRESSA 0.02 % SOLN   valACYclovir (VALTREX) 1000 MG tablet   Zinc 25 MG TABS   No current facility-administered medications for this encounter.      Konrad Felix Ward, PA-C [facility] [phone]

## 2022-01-02 NOTE — H&P (Signed)
81 yo male returns today with a complaint of penile issues.   He has a hx of BPH, elevated PSA, ED, and one kidney stone event prior.   01/11/10 prostate biopsy shows BPH, and mild chronic inflammation.   His last PSA on 03/28/18 noted to be 1.06. From a urinary perspective, he feels that voiding symptoms are stable. He does have some intermittency of his stream. He feels that he empties his bladder well. He has nocturia X 1 on occasion. No dysuria or gross hematuria.   He has passed one stone and has had no worrisome symptoms. His UA is clear.   He has a history of ED and recently completed a 6 week course of LI-EST on 02/11/20. He reports excellent results and can penetrate which he couldn't before. He will now have spontaneous erections in the mornings that are strong. He takes tadalafil 5mg  at 7am and will get an even stronger than that. He will then take an additional 100mg  sildenafil in the afternoon which augments the effects.   04/20/21: Jonathan Lane returns today in f/u for his history of ED, BPH and a prior elevated PSA. He has had FIRMEST with two booster sessions. He has been using the tadalafil 5mg  daily and sildenafil 10mg  (1/2 20mg ) prn. He has had variable success. He is voiding well with an IPSS of 3.   11/28/21: He has a sensation that there is a cyst at the end of the penis but he can't feel it. He has some discomfort in the area. He has been using a sensitizing lubricant, Trojan Arouse and Release, with sex and is concerned that he might have gotten some inside. His UA has microhematuria today and he has some right groin pain.   12/06/21: Jonathan Lane returns today for cystoscopy for evaluation of the hematuria found on his recent UA. The CT showed a renal stone but no obvious cause for hematuria although there was some increased density of tissue on the right at the bladder base and trigone that was found to be tumor on cystoscopy today. He continues to have some burning at the end of the penis.  His urine culture on 11/28/21 was negative.   CT IMPRESSION:  6 mm left renal calculus. No evidence of ureteral calculi,  hydronephrosis, or other acute findings.   Colonic diverticulosis, without radiographic evidence of  diverticulitis.   Cholelithiasis. No radiographic evidence of cholecystitis or biliary  ductal dilatation.   Stable mildly enlarged prostate.   Aortic Atherosclerosis (ICD10-I70.0).     ALLERGIES: Combigan SOLN Latanoprostene Bunod - damage to right eye    MEDICATIONS: Lipitor 10 mg tablet  Plavix  Centrum TABS Oral  Coenzyme Q10 50 mg capsule Oral  Cosopt Pf 2 %-0.5 % dropperette, single-use drop dispenser Ophthalmic  Cranberry 450 mg tablet Oral  DilTIAZem HCl ER Coated Beads 240 MG Oral Capsule Extended Release 24 Hour Oral  Fish Oil Double Strength 1200 MG Oral Capsule Oral  Glucosamine Chondroitin TABS Oral  Losartan Potassium-HCTZ 100-12.5 MG Oral Tablet Oral  Prostate Formula TABS Oral  Rhopressa 0.02 % drops  Tadalafil 5 mg tablet 1 tablet PO Daily  Tadalafil 20 mg tablet 1/2 tablet PO Daily PRN  Valtrex 1,000 mg tablet Oral     GU PSH: No GU PSH      PSH Notes: Appendectomy, Tonsillectomy   NON-GU PSH: Appendectomy - 2008 Cataract surgery, Bilateral Remove Tonsils - 2008     GU PMH: ED due to arterial insufficiency, He is doing well  with the tadalafil and uses about 15mg  for best results. Med refilled. - 11/28/2021, He continues to use the tadalafil 5mg  daily with prn sildenafil and is doing well with voiding and a reasonable response to the tadalafil 5mg  daily with occasional additional 10mg  as needed. He will return for FirmEST as needed since that gives some benefit. , - 04/20/2021, - 11/03/2020, - 10/13/2020, 03-07-20, 03-07-17, Erectile dysfunction due to arterial insufficiency, - 2015-03-08 Microscopic hematuria - 11/28/2021 Other urethritis, He has some discomfort in the terminal urethra that could be related to lubricant he is using. I offered  suggestions on how to avoid that possibility, but he also has microhematuria with some pyuria of uncertain etiology. He has a LLP stone but I will have him return for cystoscopy to complete the evaluation. - 11/28/2021 Renal calculus, He has a LLP stone with a prior history of stones but it is non-obstructing and doesn't require treatment at this time. - 11/28/2021, Nephrolithiasis, - 2015/03/08 Unil Inguinal Hernia W/O obst or gang,non-recurrent, The hernia is small and an incidental finding. - 11/28/2021 BPH w/LUTS - 04/20/2021, (Stable), - 03/07/2017, Benign prostatic hyperplasia with urinary obstruction, - 2015/03/08 History of urolithiasis, No hematuria or flank pain. - 04/20/2021, He has had no stones symptoms., 03/08/2019 Nocturia - 04/20/2021, March 07, 2017, Nocturia, - 03-08-15 Elevated PSA - 03/07/17, Elevated prostate specific antigen (PSA), - 03/08/15    NON-GU PMH: Encounter for general adult medical examination without abnormal findings, Encounter for preventive health examination - 03-08-15 Personal history of other diseases of the circulatory system, History of hypertension - 07-Mar-2013 Personal history of other diseases of the nervous system and sense organs, History of glaucoma - Mar 07, 2013    FAMILY HISTORY: Death In The Family Father - Runs In Family Death In The Family Mother - Runs In Family Family Health Status Number - Runs In Family Hypertension - Father Obesity - Father   SOCIAL HISTORY: Marital Status: Married     Notes: Previous History Of Smoking, Alcohol Use, Tobacco Use, Occupation:, Caffeine Use   REVIEW OF SYSTEMS:    GU Review Male:   Patient denies frequent urination, hard to postpone urination, burning/ pain with urination, get up at night to urinate, leakage of urine, stream starts and stops, trouble starting your stream, have to strain to urinate , erection problems, and penile pain.  Gastrointestinal (Upper):   Patient denies nausea, vomiting, and indigestion/ heartburn.  Gastrointestinal (Lower):   Patient  denies diarrhea and constipation.  Constitutional:   Patient denies fever, night sweats, weight loss, and fatigue.  Skin:   Patient denies skin rash/ lesion and itching.  Eyes:   Patient denies double vision and blurred vision.  Ears/ Nose/ Throat:   Patient denies sore throat and sinus problems.  Hematologic/Lymphatic:   Patient denies swollen glands and easy bruising.  Cardiovascular:   Patient denies leg swelling and chest pains.  Respiratory:   Patient denies cough and shortness of breath.  Endocrine:   Patient denies excessive thirst.  Musculoskeletal:   Patient denies back pain and joint pain.  Neurological:   Patient denies headaches and dizziness.  Psychologic:   Patient denies depression and anxiety.   VITAL SIGNS: None   MULTI-SYSTEM PHYSICAL EXAMINATION:    Constitutional: Well-nourished. No physical deformities. Normally developed. Good grooming.   Respiratory: Normal breath sounds. No labored breathing, no use of accessory muscles.   Cardiovascular: Regular rate and rhythm. No murmur, no gallop.      Complexity of Data:  Records Review:   Previous Patient Records  Urine Test Review:   Urinalysis, Urine Culture  X-Ray Review: [facility] Protocol: Reviewed Films. Reviewed Report. Discussed With Patient.     03/28/18 03/19/17 01/14/15 10/22/13 02/04/13 02/05/12 07/19/10 12/02/09  PSA  Total PSA 1.06 ng/mL 1.49 ng/dl 1.49  1.30  2.61  1.17  1.72  2.51     PROCEDURES:         Flexible Cystoscopy - 52000  Risks, benefits, and some of the potential complications of the procedure were discussed. 59ml of 2% lidocaine jelly was instilled intraurethrally.     Meatus:  Normal size. Normal location. Normal condition.  Urethra:  No strictures.  External Sphincter:  Normal.  Verumontanum:  Normal.  Prostate:  Obstructing. Moderate hyperplasia.  Bladder Neck:  Non-obstructing.  Ureteral Orifices:  Normal location. Normal size. Normal shape. On right. Left not seen.   Bladder:   Mild trabeculation. A trigone tumor. 3 cm tumor. Solitary tumor. Normal mucosa. No stones.      The procedure was well tolerated and there were no complications.         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 The left UO is not apparent. This the probably cause of his voiding symptoms and microhematuria. I will get him set up for cystoscopy with bilateral RTG's, TURBT with possible gemcitabine and possible stent. I have reviewed the risks of bleeding, infection, bladder, urethral and ureteral injury, need for secondary procedures, chemical cystitis, thrombotic events and anesthetic complications.   2   Microscopic hematuria - R31.21 Acute, Resolved - The hematuria has cleared.   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    PLAN:           Orders Labs Urine Cytology          Schedule Return Visit/Planned Activity: Next Available Appointment - Schedule Surgery  Procedure: Unspecified Date - Cystoscopy TURBT 2-5 cm - 36144 Notes: Next available.           Document Letter(s):  Created for Patient: Clinical Summary

## 2022-01-03 ENCOUNTER — Ambulatory Visit (HOSPITAL_COMMUNITY): Payer: PPO

## 2022-01-03 ENCOUNTER — Encounter (HOSPITAL_COMMUNITY)
Admission: RE | Disposition: A | Discharge status: Home / Self Care | Payer: Self-pay | Source: Home / Self Care | Attending: Urology

## 2022-01-03 ENCOUNTER — Ambulatory Visit (HOSPITAL_COMMUNITY): Payer: PPO | Admitting: Anesthesiology

## 2022-01-03 ENCOUNTER — Ambulatory Visit (HOSPITAL_COMMUNITY): Payer: PPO | Admitting: Physician Assistant

## 2022-01-03 ENCOUNTER — Encounter (HOSPITAL_COMMUNITY): Payer: Self-pay | Admitting: Urology

## 2022-01-03 ENCOUNTER — Observation Stay (HOSPITAL_COMMUNITY)
Admission: RE | Admit: 2022-01-03 | Discharge: 2022-01-05 | Disposition: A | Discharge status: Home / Self Care | Payer: PPO | Attending: Urology | Admitting: Urology

## 2022-01-03 DIAGNOSIS — K5732 Diverticulitis of large intestine without perforation or abscess without bleeding: Secondary | ICD-10-CM | POA: Insufficient documentation

## 2022-01-03 DIAGNOSIS — D494 Neoplasm of unspecified behavior of bladder: Secondary | ICD-10-CM | POA: Diagnosis not present

## 2022-01-03 DIAGNOSIS — I10 Essential (primary) hypertension: Secondary | ICD-10-CM | POA: Diagnosis not present

## 2022-01-03 DIAGNOSIS — Z7902 Long term (current) use of antithrombotics/antiplatelets: Secondary | ICD-10-CM | POA: Insufficient documentation

## 2022-01-03 DIAGNOSIS — Z79899 Other long term (current) drug therapy: Secondary | ICD-10-CM | POA: Insufficient documentation

## 2022-01-03 DIAGNOSIS — C675 Malignant neoplasm of bladder neck: Principal | ICD-10-CM | POA: Insufficient documentation

## 2022-01-03 DIAGNOSIS — C670 Malignant neoplasm of trigone of bladder: Secondary | ICD-10-CM | POA: Diagnosis not present

## 2022-01-03 DIAGNOSIS — Z7901 Long term (current) use of anticoagulants: Secondary | ICD-10-CM | POA: Insufficient documentation

## 2022-01-03 DIAGNOSIS — N200 Calculus of kidney: Secondary | ICD-10-CM | POA: Insufficient documentation

## 2022-01-03 DIAGNOSIS — N401 Enlarged prostate with lower urinary tract symptoms: Secondary | ICD-10-CM | POA: Insufficient documentation

## 2022-01-03 DIAGNOSIS — G4733 Obstructive sleep apnea (adult) (pediatric): Secondary | ICD-10-CM | POA: Diagnosis not present

## 2022-01-03 DIAGNOSIS — I700 Atherosclerosis of aorta: Secondary | ICD-10-CM | POA: Diagnosis not present

## 2022-01-03 DIAGNOSIS — C678 Malignant neoplasm of overlapping sites of bladder: Secondary | ICD-10-CM | POA: Diagnosis not present

## 2022-01-03 DIAGNOSIS — Z9989 Dependence on other enabling machines and devices: Secondary | ICD-10-CM | POA: Diagnosis not present

## 2022-01-03 DIAGNOSIS — C679 Malignant neoplasm of bladder, unspecified: Secondary | ICD-10-CM | POA: Diagnosis present

## 2022-01-03 DIAGNOSIS — C674 Malignant neoplasm of posterior wall of bladder: Secondary | ICD-10-CM

## 2022-01-03 DIAGNOSIS — D4959 Neoplasm of unspecified behavior of other genitourinary organ: Secondary | ICD-10-CM | POA: Diagnosis not present

## 2022-01-03 HISTORY — PX: TRANSURETHRAL RESECTION OF BLADDER TUMOR WITH MITOMYCIN-C: SHX6459

## 2022-01-03 LAB — PROTIME-INR
INR: 1.1 (ref 0.8–1.2)
Prothrombin Time: 13.7 seconds (ref 11.4–15.2)

## 2022-01-03 LAB — APTT: aPTT: 29 seconds (ref 24–36)

## 2022-01-03 SURGERY — TRANSURETHRAL RESECTION OF BLADDER TUMOR WITH MITOMYCIN-C
Anesthesia: General | Laterality: Bilateral

## 2022-01-03 MED ORDER — SODIUM CHLORIDE 0.9% FLUSH: 3.0000 mL | Freq: Two times a day (BID) | INTRAVENOUS | Status: DC

## 2022-01-03 MED ORDER — SENNOSIDES-DOCUSATE SODIUM 8.6-50 MG PO TABS: 1.0000 | ORAL_TABLET | Freq: Every evening | ORAL | Status: DC | PRN

## 2022-01-03 MED ORDER — DIPHENHYDRAMINE HCL 50 MG/ML IJ SOLN: 12.5000 mg | Freq: Four times a day (QID) | INTRAMUSCULAR | Status: DC | PRN

## 2022-01-03 MED ORDER — ZOLPIDEM TARTRATE 5 MG PO TABS
5.0000 mg | ORAL_TABLET | Freq: Every evening | ORAL | Status: DC | PRN
  Administered 2022-01-04 (×2): 5 mg via ORAL
  Filled 2022-01-03 (×2): qty 1

## 2022-01-03 MED ORDER — ORAL CARE MOUTH RINSE: 15.0000 mL | Freq: Once | OROMUCOSAL | Status: AC

## 2022-01-03 MED ORDER — ACETAMINOPHEN 10 MG/ML IV SOLN
1000.0000 mg | Freq: Once | INTRAVENOUS | Status: DC | PRN
  Administered 2022-01-03: 1000 mg via INTRAVENOUS

## 2022-01-03 MED ORDER — DIPHENHYDRAMINE HCL 12.5 MG/5ML PO ELIX: 12.5000 mg | ORAL_SOLUTION | Freq: Four times a day (QID) | ORAL | Status: DC | PRN

## 2022-01-03 MED ORDER — 0.9 % SODIUM CHLORIDE (POUR BTL) OPTIME
TOPICAL | Status: DC | PRN
  Administered 2022-01-03: 1000 mL

## 2022-01-03 MED ORDER — NETARSUDIL DIMESYLATE 0.02 % OP SOLN
1.0000 [drp] | Freq: Every day | OPHTHALMIC | Status: DC
  Administered 2022-01-04: 1 [drp] via OPHTHALMIC

## 2022-01-03 MED ORDER — HYDROCHLOROTHIAZIDE 25 MG PO TABS
25.0000 mg | ORAL_TABLET | Freq: Every day | ORAL | Status: DC
  Administered 2022-01-03 – 2022-01-05 (×3): 25 mg via ORAL
  Filled 2022-01-03 (×3): qty 1

## 2022-01-03 MED ORDER — BISACODYL 10 MG RE SUPP: 10.0000 mg | Freq: Every day | RECTAL | Status: DC | PRN

## 2022-01-03 MED ORDER — ACETAMINOPHEN 325 MG PO TABS
650.0000 mg | ORAL_TABLET | ORAL | Status: DC | PRN
  Administered 2022-01-03 – 2022-01-04 (×4): 650 mg via ORAL
  Filled 2022-01-03 (×4): qty 2

## 2022-01-03 MED ORDER — FENTANYL CITRATE PF 50 MCG/ML IJ SOSY
25.0000 ug | PREFILLED_SYRINGE | INTRAMUSCULAR | Status: DC | PRN
  Administered 2022-01-03: 50 ug via INTRAVENOUS

## 2022-01-03 MED ORDER — SUGAMMADEX SODIUM 200 MG/2ML IV SOLN
INTRAVENOUS | Status: DC | PRN
  Administered 2022-01-03: 200 mg via INTRAVENOUS

## 2022-01-03 MED ORDER — FLEET ENEMA 7-19 GM/118ML RE ENEM: 1.0000 | ENEMA | Freq: Once | RECTAL | Status: DC | PRN

## 2022-01-03 MED ORDER — LACTATED RINGERS IV SOLN: INTRAVENOUS | Status: DC

## 2022-01-03 MED ORDER — PANTOPRAZOLE SODIUM 40 MG PO TBEC
40.0000 mg | DELAYED_RELEASE_TABLET | Freq: Every day | ORAL | Status: DC
  Administered 2022-01-03 – 2022-01-05 (×3): 40 mg via ORAL
  Filled 2022-01-03 (×3): qty 1

## 2022-01-03 MED ORDER — OXYCODONE HCL 5 MG PO TABS: 5.0000 mg | ORAL_TABLET | ORAL | Status: DC | PRN

## 2022-01-03 MED ORDER — HYDROMORPHONE HCL 1 MG/ML IJ SOLN: 0.5000 mg | INTRAMUSCULAR | Status: DC | PRN

## 2022-01-03 MED ORDER — ONDANSETRON HCL 4 MG/2ML IJ SOLN
INTRAMUSCULAR | Status: DC | PRN
  Administered 2022-01-03: 4 mg via INTRAVENOUS

## 2022-01-03 MED ORDER — CEFAZOLIN SODIUM-DEXTROSE 2-4 GM/100ML-% IV SOLN
2.0000 g | INTRAVENOUS | Status: AC
  Administered 2022-01-03: 2 g via INTRAVENOUS

## 2022-01-03 MED ORDER — ONDANSETRON HCL 4 MG/2ML IJ SOLN: 4.0000 mg | INTRAMUSCULAR | Status: DC | PRN

## 2022-01-03 MED ORDER — DORZOLAMIDE HCL-TIMOLOL MAL 2-0.5 % OP SOLN
1.0000 [drp] | Freq: Two times a day (BID) | OPHTHALMIC | Status: DC
  Administered 2022-01-03 – 2022-01-05 (×4): 1 [drp] via OPHTHALMIC
  Filled 2022-01-03: qty 10

## 2022-01-03 MED ORDER — DILTIAZEM HCL ER COATED BEADS 240 MG PO CP24
240.0000 mg | ORAL_CAPSULE | Freq: Every day | ORAL | Status: DC
Start: 2022-01-03 — End: 2022-01-05
  Administered 2022-01-04 – 2022-01-05 (×2): 240 mg via ORAL
  Filled 2022-01-03 (×4): qty 1

## 2022-01-03 MED ORDER — LIDOCAINE HCL (CARDIAC) PF 100 MG/5ML IV SOSY
PREFILLED_SYRINGE | INTRAVENOUS | Status: DC | PRN
  Administered 2022-01-03: 40 mg via INTRATRACHEAL

## 2022-01-03 MED ORDER — CHLORHEXIDINE GLUCONATE 0.12 % MT SOLN
15.0000 mL | Freq: Once | OROMUCOSAL | Status: AC
  Administered 2022-01-03: 15 mL via OROMUCOSAL

## 2022-01-03 MED ORDER — OXYBUTYNIN CHLORIDE 5 MG PO TABS: 5.0000 mg | ORAL_TABLET | Freq: Three times a day (TID) | ORAL | Status: DC | PRN

## 2022-01-03 MED ORDER — LOSARTAN POTASSIUM-HCTZ 100-25 MG PO TABS
1.0000 | ORAL_TABLET | Freq: Every day | ORAL | Status: DC
Start: 2022-01-03 — End: 2022-01-03

## 2022-01-03 MED ORDER — ACETAMINOPHEN 325 MG PO TABS: 650.0000 mg | ORAL_TABLET | ORAL | Status: DC | PRN

## 2022-01-03 MED ORDER — ATORVASTATIN CALCIUM 20 MG PO TABS
20.0000 mg | ORAL_TABLET | Freq: Every day | ORAL | Status: DC
  Administered 2022-01-03 – 2022-01-05 (×3): 20 mg via ORAL
  Filled 2022-01-03 (×4): qty 1

## 2022-01-03 MED ORDER — FENTANYL CITRATE PF 50 MCG/ML IJ SOSY
PREFILLED_SYRINGE | INTRAMUSCULAR | Status: AC
  Filled 2022-01-03: qty 2

## 2022-01-03 MED ORDER — EPHEDRINE SULFATE-NACL 50-0.9 MG/10ML-% IV SOSY
PREFILLED_SYRINGE | INTRAVENOUS | Status: DC | PRN
  Administered 2022-01-03 (×2): 10 mg via INTRAVENOUS

## 2022-01-03 MED ORDER — FENTANYL CITRATE (PF) 250 MCG/5ML IJ SOLN
INTRAMUSCULAR | Status: DC | PRN
  Administered 2022-01-03: 50 ug via INTRAVENOUS

## 2022-01-03 MED ORDER — SODIUM CHLORIDE 0.9% FLUSH: 3.0000 mL | INTRAVENOUS | Status: DC | PRN

## 2022-01-03 MED ORDER — CEFAZOLIN SODIUM-DEXTROSE 2-4 GM/100ML-% IV SOLN
INTRAVENOUS | Status: AC
  Filled 2022-01-03: qty 100

## 2022-01-03 MED ORDER — NITROGLYCERIN 0.4 MG SL SUBL: 0.4000 mg | SUBLINGUAL_TABLET | SUBLINGUAL | Status: DC | PRN

## 2022-01-03 MED ORDER — IOHEXOL 300 MG/ML  SOLN
INTRAMUSCULAR | Status: DC | PRN
  Administered 2022-01-03: 10 mL

## 2022-01-03 MED ORDER — DEXAMETHASONE SODIUM PHOSPHATE 10 MG/ML IJ SOLN
INTRAMUSCULAR | Status: DC | PRN
  Administered 2022-01-03: 10 mg via INTRAVENOUS

## 2022-01-03 MED ORDER — SODIUM CHLORIDE 0.9 % IR SOLN
Status: DC | PRN
  Administered 2022-01-03 (×6): 3000 mL via INTRAVESICAL

## 2022-01-03 MED ORDER — FENTANYL CITRATE (PF) 100 MCG/2ML IJ SOLN
INTRAMUSCULAR | Status: AC
  Filled 2022-01-03: qty 2

## 2022-01-03 MED ORDER — HYDROCODONE-ACETAMINOPHEN 5-325 MG PO TABS: 1.0000 | ORAL_TABLET | ORAL | 0 refills | Status: DC | PRN

## 2022-01-03 MED ORDER — POTASSIUM CHLORIDE IN NACL 20-0.45 MEQ/L-% IV SOLN
INTRAVENOUS | Status: DC
  Filled 2022-01-03 (×6): qty 1000

## 2022-01-03 MED ORDER — SODIUM CHLORIDE 0.9 % IV SOLN: 250.0000 mL | INTRAVENOUS | Status: DC | PRN

## 2022-01-03 MED ORDER — ONDANSETRON HCL 4 MG/2ML IJ SOLN: 4.0000 mg | Freq: Once | INTRAMUSCULAR | Status: DC | PRN

## 2022-01-03 MED ORDER — ESMOLOL HCL 100 MG/10ML IV SOLN
INTRAVENOUS | Status: DC | PRN
  Administered 2022-01-03 (×2): 30 mg via INTRAVENOUS

## 2022-01-03 MED ORDER — ACETAMINOPHEN 650 MG RE SUPP: 650.0000 mg | RECTAL | Status: DC | PRN

## 2022-01-03 MED ORDER — ACETAMINOPHEN 10 MG/ML IV SOLN
INTRAVENOUS | Status: AC
  Filled 2022-01-03: qty 100

## 2022-01-03 MED ORDER — FLUTICASONE PROPIONATE 50 MCG/ACT NA SUSP: 2.0000 | Freq: Every day | NASAL | Status: DC | PRN

## 2022-01-03 MED ORDER — LOSARTAN POTASSIUM 50 MG PO TABS
100.0000 mg | ORAL_TABLET | Freq: Every day | ORAL | Status: DC
  Administered 2022-01-03 – 2022-01-05 (×3): 100 mg via ORAL
  Filled 2022-01-03 (×3): qty 2

## 2022-01-03 MED ORDER — MORPHINE SULFATE (PF) 2 MG/ML IV SOLN: 2.0000 mg | INTRAVENOUS | Status: DC | PRN

## 2022-01-03 MED ORDER — ROCURONIUM BROMIDE 10 MG/ML (PF) SYRINGE
PREFILLED_SYRINGE | INTRAVENOUS | Status: DC | PRN
  Administered 2022-01-03: 60 mg via INTRAVENOUS

## 2022-01-03 MED ORDER — MELATONIN 5 MG PO TABS
5.0000 mg | ORAL_TABLET | Freq: Every day | ORAL | Status: DC
  Administered 2022-01-03: 5 mg via ORAL
  Filled 2022-01-03: qty 1

## 2022-01-03 MED ORDER — PROPOFOL 10 MG/ML IV BOLUS
INTRAVENOUS | Status: DC | PRN
  Administered 2022-01-03: 80 mg via INTRAVENOUS

## 2022-01-03 MED ORDER — LABETALOL HCL 5 MG/ML IV SOLN: INTRAVENOUS | Status: DC | PRN

## 2022-01-03 SURGICAL SUPPLY — 25 items
BAG COUNTER SPONGE SURGICOUNT (BAG) ×2 IMPLANT
BAG URINE DRAIN 2000ML AR STRL (UROLOGICAL SUPPLIES) IMPLANT
BAG URO CATCHER STRL LF (MISCELLANEOUS) ×2 IMPLANT
CATH FOLEY 2WAY SLVR  5CC 20FR (CATHETERS) ×2
CATH FOLEY 2WAY SLVR 5CC 20FR (CATHETERS) IMPLANT
CATH FOLEY 3WAY 30CC 22FR (CATHETERS) IMPLANT
CATH URETL OPEN 5X70 (CATHETERS) IMPLANT
DRAPE FOOT SWITCH (DRAPES) ×2 IMPLANT
ELECT REM PT RETURN 15FT ADLT (MISCELLANEOUS) ×2 IMPLANT
GLOVE SURG POLYISO LF SZ8 (GLOVE) IMPLANT
GOWN STRL REUS W/TWL XL LVL3 (GOWN DISPOSABLE) ×2 IMPLANT
GUIDEWIRE STR DUAL SENSOR (WIRE) ×1 IMPLANT
HOLDER FOLEY CATH W/STRAP (MISCELLANEOUS) IMPLANT
KIT TURNOVER KIT A (KITS) ×2 IMPLANT
LOOP CUT BIPOLAR 24F LRG (ELECTROSURGICAL) IMPLANT
MANIFOLD NEPTUNE II (INSTRUMENTS) ×2 IMPLANT
PACK CYSTO (CUSTOM PROCEDURE TRAY) ×2 IMPLANT
SET IRRIG Y TYPE TUR BLADDER L (SET/KITS/TRAYS/PACK) ×2 IMPLANT
STENT URET 6FRX26 CONTOUR (STENTS) ×2 IMPLANT
SUT ETHILON 3 0 PS 1 (SUTURE) IMPLANT
SYR 30ML LL (SYRINGE) IMPLANT
SYR TOOMEY IRRIG 70ML (MISCELLANEOUS) ×2
SYRINGE TOOMEY IRRIG 70ML (MISCELLANEOUS) IMPLANT
TUBING CONNECTING 10 (TUBING) ×2 IMPLANT
TUBING UROLOGY SET (TUBING) ×2 IMPLANT

## 2022-01-03 NOTE — Discharge Instructions (Addendum)
You may remove the foley in 2 days.  If you have concerns about doing that, please contact the office to arrange a visit to have it removed.   You may resume the Plavix in 1 week if you are not experiencing bleeding.

## 2022-01-03 NOTE — Progress Notes (Signed)
Patient is aware that his surgery has been delayed.  He called his sig.other to inform them also.

## 2022-01-03 NOTE — Anesthesia Postprocedure Evaluation (Signed)
Anesthesia Post Note  Patient: Jonathan Lane  Procedure(s) Performed: CYSTOSCOPY TRANSURETHRAL RESECTION OF BLADDER TUMOR WITH POSSIBLE  BILATERAL RETROGRADES STENT PLACEMENT (Bilateral)     Patient location during evaluation: PACU Anesthesia Type: General Level of consciousness: awake and alert Pain management: pain level controlled Vital Signs Assessment: post-procedure vital signs reviewed and stable Respiratory status: spontaneous breathing, nonlabored ventilation, respiratory function stable and patient connected to nasal cannula oxygen Cardiovascular status: blood pressure returned to baseline and stable Postop Assessment: no apparent nausea or vomiting Anesthetic complications: no   No notable events documented.  Last Vitals:  Vitals:   01/03/22 1245 01/03/22 1300  BP: (!) 169/80 (!) 168/81  Pulse: 61 61  Resp: 18 16  Temp:    SpO2: 93% 94%    Last Pain:  Vitals:   01/03/22 1300  TempSrc:   PainSc: 2                  Barnet Glasgow

## 2022-01-03 NOTE — Interval H&P Note (Signed)
History and Physical Interval Note:  He has had no hematuria.   01/03/2022 8:35 AM  Lucille Passy Criswell  has presented today for surgery, with the diagnosis of bladder tumor.  The various methods of treatment have been discussed with the patient and family. After consideration of risks, benefits and other options for treatment, the patient has consented to  Procedure(s): CYSTOSCOPY TRANSURETHRAL RESECTION OF BLADDER TUMOR WITH POSSIBLE  GEMCITABINE BILATERAL RETROGRADES;POSSIBLE STENT PLACEMENT (Bilateral) as a surgical intervention.  The patient's history has been reviewed, patient examined, no change in status, stable for surgery.  I have reviewed the patient's chart and labs.  Questions were answered to the patient's satisfaction.     [name]

## 2022-01-03 NOTE — Anesthesia Procedure Notes (Signed)
Procedure Name: Intubation Date/Time: 01/03/2022 11:03 AM Performed by: Pilar Grammes, CRNA Pre-anesthesia Checklist: Patient identified, Emergency Drugs available, Suction available, Patient being monitored and Timeout performed Patient Re-evaluated:Patient Re-evaluated prior to induction Oxygen Delivery Method: Circle system utilized Preoxygenation: Pre-oxygenation with 100% oxygen Induction Type: IV induction Ventilation: Mask ventilation without difficulty and Two handed mask ventilation required Laryngoscope Size: Miller and 3 Tube type: Oral Tube size: 7.5 mm Number of attempts: 1 Airway Equipment and Method: Stylet Placement Confirmation: positive ETCO2, ETT inserted through vocal cords under direct vision, CO2 detector and breath sounds checked- equal and bilateral Secured at: 23 cm Tube secured with: Tape Dental Injury: Teeth and Oropharynx as per pre-operative assessment

## 2022-01-03 NOTE — Transfer of Care (Signed)
Immediate Anesthesia Transfer of Care Note  Patient: Jonathan Lane  Procedure(s) Performed: CYSTOSCOPY TRANSURETHRAL RESECTION OF BLADDER TUMOR WITH POSSIBLE  BILATERAL RETROGRADES STENT PLACEMENT (Bilateral)  Patient Location: PACU  Anesthesia Type:General  Level of Consciousness: alert , drowsy and patient cooperative  Airway & Oxygen Therapy: Patient Spontanous Breathing and Patient connected to face mask oxygen  Post-op Assessment: Report given to RN, Post -op Vital signs reviewed and stable and Patient moving all extremities X 4  Post vital signs: stable  Last Vitals:  Vitals Value Taken Time  BP 165/81 01/03/22 1220  Temp    Pulse 61 01/03/22 1221  Resp 12 01/03/22 1221  SpO2 100 % 01/03/22 1221  Vitals shown include unvalidated device data.  Last Pain:  Vitals:   01/03/22 0714  TempSrc:   PainSc: 0-No pain         Complications: No notable events documented.

## 2022-01-03 NOTE — Op Note (Signed)
Procedure: 1.  Cystoscopy with transurethral resection of a large bladder tumor of the trigone and bladder neck. 2.  Cystoscopy with bilateral retrograde pyelograms and interpretation. 3.  Cystoscopy with insertion of bilateral double-J stents. 4.  Application of fluoroscopy.  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  Postop diagnosis: 1.  Bladder tumor of the trigone, bladder neck and posterior wall of the bladder. 2.  Left distal ureteral tumor.  Surgeon: Dr. [name].  Anesthesia: General.  Specimen: Bladder tumor chips.  Drain: Bilateral 6 [place] by 26 cm contour double-J stent and 20 French Foley catheter.  EBL: 10 mL.  Complications: None.  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  He is to undergo further evaluation and treatment today.  Procedure: He was taken operating room where he was given antibiotic.  A general anesthetic was induced.  He was placed in lithotomy position and fitted with PAS hose.  His perineum and genitalia were prepped with Betadine solution he was draped in usual sterile fashion.  Cystoscopy was performed using the 21 [place] scope and 30 degree lens.  Examination revealed a normal urethra.  The prostatic urethra was approximately 2 to 3 cm in length with bilobar hyperplasia and only a small middle lobe.  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  The left ureteral orifice was not identified and there was tumor close to but not emanating from the right ureteral orifice.  Right retrograde pyelography was performed with a  5 [place] open-ended catheter and Omnipaque.  The right retrograde pyelogram demonstrated a normal ureter and intrarenal collecting system.  The urethra was then dilated to 30 [place] with Leander Rams sounds to allow placement of a 26 French continuous-flow resectoscope sheath with a visual obturator.  I had to do a little additional dilation with a hemostat due to a small shelf that prevented placement after [employer] dilation, but eventually I was able to get the scope to pass.  Scope was then fitted with an Beatrix Fetters handle with a bipolar loop and 30 degree lens.  Saline was used as the irrigant.  The tumor was then resected down to the muscular fibers and then the surrounding mucosa that was not resected but was abnormal was generously fulgurated.  The resection and fulguration defect was greater than 5 cm and extended from the right lateral wall to the left lateral wall and from bladder neck onto the posterior wall.  The right ureteral orifice had to be resected to complete the removal of tumor from that area and the left ureteral orifice was eventually identified in the resection bed on the left with tumor fronds emanating from the orifice.  I resected the orifice back for approximately a centimeter to a centimeter and half but continued to encounter tumor fronds.  The bladder was evacuated free of chips and initial hemostasis was achieved.  A 5 French open-ended catheter was then passed using the greenlight laser bridge and the resectoscope and a left retrograde pyelogram was performed.  The left retrograde pyelogram demonstrated a filling defect in the distal ureter for approximately 1-1/2 to 2 cm from the  meatus consistent with an intraluminal neoplasm.  There was some mild hydronephrosis proximal to the tumor but no additional filling defects.  A sensor wire was then advanced to the kidney through the open-ended catheter and the open-ended catheter was removed and replaced with a 6 [place] by 26 cm  contour double-J stent without tether which was advanced the kidney under fluoroscopic guidance without difficulty.  The wire was removed, leaving a good coil in the kidney and a good coil in the bladder.  I then advanced a sensor wire up the right ureteral orifice and placed a second 6 [place] by 26 cm contour double-J stent without tether to the right kidney under fluoroscopic guidance.  The wire was removed, leaving a good coil in the kidney and a good coil in the bladder.  Final hemostasis was achieved and inspection demonstrated no residual tissue and the stent loops in good position.  The cystoscope was removed and a 20 French Foley catheter was inserted.  The balloon was filled with 10 mL of sterile fluid.  The catheter was hand irrigated with clear return and placed to straight drainage.  He was taken down from lithotomy position, his anesthetic was reversed and he was moved to recovery room in stable condition.  Gemcitabine will not be used since he has bilateral stents for the resected orifices.  There were no complications.

## 2022-01-03 NOTE — Plan of Care (Signed)
  Problem: Activity: Goal: Risk for activity intolerance will decrease Outcome: Progressing   Problem: Nutrition: Goal: Adequate nutrition will be maintained Outcome: Progressing   Problem: Coping: Goal: Level of anxiety will decrease Outcome: Progressing   

## 2022-01-03 NOTE — Anesthesia Procedure Notes (Signed)
Procedure Name: Intubation Date/Time: 01/03/2022 11:03 AM Performed by: Pilar Grammes, CRNA Pre-anesthesia Checklist: Patient identified, Emergency Drugs available, Suction available, Patient being monitored and Timeout performed Patient Re-evaluated:Patient Re-evaluated prior to induction Oxygen Delivery Method: Circle system utilized Preoxygenation: Pre-oxygenation with 100% oxygen Induction Type: IV induction Ventilation: Mask ventilation without difficulty Laryngoscope Size: Miller and 2 Grade View: Grade I Tube type: Oral Tube size: 7.5 mm Number of attempts: 1 Airway Equipment and Method: Stylet Placement Confirmation: positive ETCO2, ETT inserted through vocal cords under direct vision, CO2 detector and breath sounds checked- equal and bilateral Secured at: 23 cm Tube secured with: Tape Dental Injury: Teeth and Oropharynx as per pre-operative assessment

## 2022-01-04 ENCOUNTER — Other Ambulatory Visit: Payer: Self-pay

## 2022-01-04 ENCOUNTER — Encounter (HOSPITAL_COMMUNITY): Payer: Self-pay | Admitting: Urology

## 2022-01-04 DIAGNOSIS — C675 Malignant neoplasm of bladder neck: Secondary | ICD-10-CM | POA: Diagnosis not present

## 2022-01-04 DIAGNOSIS — C678 Malignant neoplasm of overlapping sites of bladder: Secondary | ICD-10-CM | POA: Diagnosis not present

## 2022-01-04 LAB — SURGICAL PATHOLOGY

## 2022-01-04 MED ORDER — CHLORHEXIDINE GLUCONATE CLOTH 2 % EX PADS
6.0000 | MEDICATED_PAD | Freq: Every day | CUTANEOUS | Status: DC
  Administered 2022-01-04: 6 via TOPICAL

## 2022-01-04 MED ORDER — BISACODYL 10 MG RE SUPP
10.0000 mg | Freq: Every day | RECTAL | Status: DC | PRN
  Filled 2022-01-04: qty 1

## 2022-01-04 MED ORDER — POLYETHYLENE GLYCOL 3350 17 G PO PACK
17.0000 g | PACK | Freq: Every day | ORAL | Status: DC
  Administered 2022-01-04: 17 g via ORAL
  Filled 2022-01-04: qty 1

## 2022-01-04 NOTE — TOC Initial Note (Signed)
Transition of Care [facility]) - Initial/Assessment Note    Patient Details  Name: Jonathan Lane MRN: [id number] Date of Birth: December 09, 1941  Transition of Care [facility]) CM/SW Contact:    Leeroy Cha, RN Phone Number: 01/04/2022, 8:28 AM  Clinical Narrative:                 Pod 1  Transition of Care [facility]) Screening Note   Patient Details  Name: Jonathan Lane Date of Birth: 1941/08/01   Transition of Care [facility]) CM/SW Contact:    Leeroy Cha, RN Phone Number: 01/04/2022, 8:28 AM    Transition of Care Department (TOC) has reviewed patient and no TOC needs have been identified at this time. We will continue to monitor patient advancement through interdisciplinary progression rounds. If new patient transition needs arise, please place a TOC consult.    Expected Discharge Plan: Home/Self Care Barriers to Discharge: Continued Medical Work up   Patient Goals and CMS Choice Patient states their goals for this hospitalization and ongoing recovery are:: to go home CMS Medicare.gov Compare Post Acute Care list provided to:: Patient Choice offered to / list presented to : Patient  Expected Discharge Plan and Services Expected Discharge Plan: Home/Self Care   Discharge Planning Services: CM Consult   Living arrangements for the past 2 months: Single Family Home Expected Discharge Date: 01/03/22                                    Prior Living Arrangements/Services Living arrangements for the past 2 months: Single Family Home Lives with:: Spouse Patient language and need for interpreter reviewed:: Yes Do you feel safe going back to the place where you live?: Yes            Criminal Activity/Legal Involvement Pertinent to Current Situation/Hospitalization: No - Comment as needed  Activities of Daily Living Home Assistive Devices/Equipment: None ADL Screening (condition at time of admission) Patient's cognitive ability adequate to safely complete daily  activities?: Yes Is the patient deaf or have difficulty hearing?: No Does the patient have difficulty seeing, even when wearing glasses/contacts?: No Does the patient have difficulty concentrating, remembering, or making decisions?: No Patient able to express need for assistance with ADLs?: Yes Does the patient have difficulty dressing or bathing?: No Independently performs ADLs?: Yes (appropriate for developmental age) Does the patient have difficulty walking or climbing stairs?: No Weakness of Legs: None Weakness of Arms/Hands: None  Permission Sought/Granted                  Emotional Assessment Appearance:: Appears stated age Attitude/Demeanor/Rapport: Engaged Affect (typically observed): Calm Orientation: : Oriented to Place, Oriented to Self, Oriented to  Time, Oriented to Situation Alcohol / Substance Use: Not Applicable Psych Involvement: No (comment)  Admission diagnosis:  Bladder cancer [facility]) [C67.9] Patient Active Problem List   Diagnosis Date Noted   Bladder cancer ([place]) 01/03/2022   TIA (transient ischemic attack) 08/25/2019   Hypokalemia 08/18/2019   Obstructive sleep apnea 01/22/2019   Gastroesophageal reflux disease 05/22/2018   Sore throat 05/22/2018   RLQ abdominal pain 03/15/2018   Chest pain 01/29/2018   OSA on CPAP 10/11/2015   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 [place] [address] Phone: [phone] Fax: [phone]     Social Determinants of Health (SDOH) Interventions    Readmission Risk Interventions No flowsheet data found.

## 2022-01-04 NOTE — Progress Notes (Signed)
1 Day Post-Op  Subjective: Jonathan Lane is doing well but his urine remains bloody and he is reluctant to leave which is understandable.   He has minimal pain and no other significant complaints.  ROS:  Review of Systems  All other systems reviewed and are negative.  Anti-infectives: Anti-infectives (From admission, onward)    Start     Dose/Rate Route Frequency Ordered Stop   01/03/22 0704  ceFAZolin (ANCEF) 2-4 GM/100ML-% IVPB       Note to Pharmacy: Kyra Leyland E: cabinet override      01/03/22 0704 01/03/22 1106   01/03/22 0657  ceFAZolin (ANCEF) IVPB 2g/100 mL premix        2 g 200 mL/hr over 30 Minutes Intravenous 30 min pre-op 01/03/22 0657 01/03/22 1104       Current Facility-Administered Medications  Medication Dose Route Frequency Provider Last Rate Last Admin   0.45 % NaCl with KCl 20 mEq / L infusion   Intravenous Continuous [name], MD 100 mL/hr at 01/03/22 2213 New Bag at 01/03/22 2213   acetaminophen (TYLENOL) tablet 650 mg  650 mg Oral Q4H PRN Polly Cobia, RPH   650 mg at 01/04/22 0352   atorvastatin (LIPITOR) tablet 20 mg  20 mg Oral Daily [name], MD   20 mg at 01/03/22 1856   bisacodyl (DULCOLAX) suppository 10 mg  10 mg Rectal Daily PRN [name], MD       Chlorhexidine Gluconate Cloth 2 % PADS 6 each  6 each Topical Daily [name], MD       diltiazem (CARDIZEM CD) 24 hr capsule 240 mg  240 mg Oral Daily [name], MD       diphenhydrAMINE (BENADRYL) injection 12.5 mg  12.5 mg Intravenous Q6H PRN [name], MD       Or   diphenhydrAMINE (BENADRYL) 12.5 MG/5ML elixir 12.5 mg  12.5 mg Oral Q6H PRN [name], MD       dorzolamide-timolol (COSOPT) 22.3-6.8 MG/ML ophthalmic solution 1 drop  1 drop Both Eyes BID [name], MD   1 drop at 01/03/22 2209   fluticasone (FLONASE) 50 MCG/ACT nasal spray 2 spray  2 spray Each Nare Daily PRN [name], MD       losartan (COZAAR) tablet 100 mg  100 mg Oral Daily Polly Cobia, RPH   100 mg at  01/03/22 1856   And   hydrochlorothiazide (HYDRODIURIL) tablet 25 mg  25 mg Oral Daily Polly Cobia, RPH   25 mg at 01/03/22 1857   HYDROmorphone (DILAUDID) injection 0.5-1 mg  0.5-1 mg Intravenous Q2H PRN [name], MD       melatonin tablet 5 mg  5 mg Oral QHS Ardis Hughs, MD   5 mg at 01/03/22 2208   Netarsudil Dimesylate 0.02 % SOLN 1 drop  1 drop Both Eyes QHS [name], MD       nitroGLYCERIN (NITROSTAT) SL tablet 0.4 mg  0.4 mg Sublingual Q5 min PRN [name], MD       ondansetron [facility]) injection 4 mg  4 mg Intravenous Q4H PRN [name], MD       oxybutynin (DITROPAN) tablet 5 mg  5 mg Oral Q8H PRN [name], MD       pantoprazole (PROTONIX) EC tablet 40 mg  40 mg Oral Daily [name], MD   40 mg at 01/03/22 2209   senna-docusate (Senokot-S) tablet 1 tablet  1 tablet Oral QHS PRN [name],  MD       sodium phosphate (FLEET) 7-19 GM/118ML enema 1 enema  1 enema Rectal Once PRN [name], MD       zolpidem [facility]) tablet 5 mg  5 mg Oral QHS PRN [name], MD   5 mg at 01/04/22 0153     Objective: Vital signs in last 24 hours: Temp:  [97.6 F (36.4 C)-98.7 F (37.1 C)] 97.6 F (36.4 C) (01/18 0444) Pulse Rate:  [59-91] 67 (01/18 0444) Resp:  [12-18] 17 (01/18 0444) BP: (154-180)/(76-92) 154/79 (01/18 0444) SpO2:  [93 %-97 %] 96 % (01/18 0444)  Intake/Output from previous day: 01/17 0701 - 01/18 0700 In: 1723.6 [I.V.:1723.6] Out: 4100 [Urine:4000; Blood:100] Intake/Output this shift: No intake/output data recorded.   Physical Exam Vitals reviewed.  Constitutional:      Appearance: Normal appearance.  Neurological:     Mental Status: He is alert.    Lab Results:  No results for input(s): WBC, HGB, HCT, PLT in the last 72 hours. BMET No results for input(s): NA, K, CL, CO2, GLUCOSE, BUN, CREATININE, CALCIUM in the last 72 hours. PT/INR Recent Labs    01/03/22 0725  LABPROT 13.7  INR 1.1   ABG No results for input(s): PHART, HCO3  in the last 72 hours.  Invalid input(s): PCO2, PO2  Studies/Results: DG C-Arm 1-60 Min-No Report  Result Date: 01/03/2022 Fluoroscopy was utilized by the requesting physician.  No radiographic interpretation.     Assessment and Plan: Bladder cancer of overlapping sites and involving the left distal ureter.   His urine remains bloody so I will keep him in another night and plan for foley removal and discharge on 1/18 if urine is clearing.   He will need another procedure in 3-4 wks with possible restaging TURBT and left ureteroscopy with laser ablation.    Constipation.  I will give him a dulcolax and miralax.       LOS: 0 days    [name] 01/04/2022 [phone] Patient ID: ELISHUA RADFORD, male   DOB: 1941/09/27, 81 y.o.   MRN: [id number]

## 2022-01-05 DIAGNOSIS — C675 Malignant neoplasm of bladder neck: Secondary | ICD-10-CM | POA: Diagnosis not present

## 2022-01-05 MED ORDER — CLOPIDOGREL BISULFATE 75 MG PO TABS: 75.0000 mg | ORAL_TABLET | Freq: Every day | ORAL | Status: DC

## 2022-01-05 NOTE — Discharge Summary (Signed)
Physician Discharge Summary  Patient ID: Jonathan Lane MRN: [id number] DOB/AGE: 01/21/1941 81 y.o.  Admit date: 01/03/2022 Discharge date: 01/05/2022  Admission Diagnoses:  Discharge Diagnoses:  Principal Problem:   Bladder cancer [facility])   Discharged Condition: good  Hospital Course: Patient was admitted following TURBT 01/03/2022.  His urine was slightly bloody on postop day 1 and he was kept until postoperative day 2 when his urine was clear.  His Foley was removed this morning on postop day 2.  He will be discharged after he voids or have a Foley catheter replaced and to be discharged.  Consults:  none  Significant Diagnostic Studies: none  Treatments: surgery: Procedure: 1.  Cystoscopy with transurethral resection of a large bladder tumor of the trigone and bladder neck. 2.  Cystoscopy with bilateral retrograde pyelograms and interpretation. 3.  Cystoscopy with insertion of bilateral double-J stents. 4.  Application of fluoroscopy.  Discharge Exam: Blood pressure (!) 145/68, pulse 63, temperature 98 F (36.7 C), resp. rate 19, height 6' 1.5" (1.867 m), weight 88.7 kg, SpO2 95 %. NAD Sitting in chair, eating lunch and watching TV Regular respiratory rate and depth Abdomen soft and nontender Extremity-no calf pain or swelling.  Disposition: Discharge disposition: 01-Home or Self Care       Discharge Instructions     Discharge patient   Complete by: As directed    Please make sure patient voids prior to discharge.  Thank you.   Discharge disposition: 01-Home or Self Care   Discharge patient date: 01/05/2022      Allergies as of 01/05/2022       Reactions   Brimonidine Tartrate-timolol Itching, Rash   Itching eyes   Brimonidine Tartrate Other (See Comments)   Tetracycline Hcl Other (See Comments)   Doesn't remember reaction-"like 55 years ago"   Ciprofloxacin Other (See Comments)   constipated        Medication List     TAKE these medications     ALIGN PO Take 1 tablet by mouth daily. Take daily   atorvastatin 20 MG tablet Commonly known as: LIPITOR Take 1 tablet (20 mg total) by mouth daily.   clopidogrel 75 MG tablet Commonly known as: PLAVIX Take 1 tablet (75 mg total) by mouth daily. Start taking on: January 09, 2022 What changed: These instructions start on January 09, 2022. If you are unsure what to do until then, ask your doctor or other care provider.   CO Q 10 PO Take 1 capsule by mouth daily.   Cosopt PF 22.3-6.8 MG/ML Soln ophthalmic solution Generic drug: dorzolamidel-timolol Place 1 drop into both eyes 2 (two) times daily.   Cranberry 400 MG Caps Take 400 mg by mouth daily.   Dilt-XR 240 MG 24 hr capsule Generic drug: diltiazem TAKE ONE CAPSULE BY MOUTH ONCE DAILY   esomeprazole 20 MG capsule Commonly known as: NEXIUM Take 20 mg by mouth 2 (two) times daily before a meal.   fish oil-omega-3 fatty acids 1000 MG capsule Take 1 g by mouth 2 (two) times daily.   fluticasone 50 MCG/ACT nasal spray Commonly known as: FLONASE Place 2 sprays into both nostrils daily as needed for allergies or rhinitis.   HYDROcodone-acetaminophen 5-325 MG tablet Commonly known as: NORCO/VICODIN Take 1 tablet by mouth every 4 (four) hours as needed for moderate pain.   losartan-hydrochlorothiazide 100-25 MG tablet Commonly known as: HYZAAR Take 1 tablet by mouth daily.   MOVE FREE PO Take 1 tablet by mouth daily.   multivitamin  per tablet Take 1 tablet by mouth daily.   nitroGLYCERIN 0.4 MG SL tablet Commonly known as: NITROSTAT Place 1 tablet (0.4 mg total) under the tongue every 5 (five) minutes as needed for chest pain.   PRESERVISION AREDS 2 PO Take 1 capsule by mouth in the morning and at bedtime.   PREVAGEN PO Take 1 capsule by mouth daily. [facility] and memory.   Prostate Health Caps Take 1 capsule by mouth daily.   Rhopressa 0.02 % Soln Generic drug: Netarsudil Dimesylate Place 1 drop into  both eyes at bedtime.   valACYclovir 1000 MG tablet Commonly known as: VALTREX Take 1,000 mg by mouth daily as needed (outbreak).   VITAMIN B 12 PO Take 1,000 mcg by mouth daily.   Zinc 25 MG Tabs Take 25 mg by mouth daily.        Follow-up Information     [facility] Follow up.   Why: The office will call to arrange follow up and I will call when your pathology is available. Contact information: [place] [phone]                Signed: Festus Aloe 01/05/2022, 12:06 PM

## 2022-01-05 NOTE — TOC Transition Note (Signed)
Transition of Care [facility]) - CM/SW Discharge Note   Patient Details  Name: CADON RACZKA MRN: [id number] Date of Birth: [birth year]  [facility] Care [facility]) CM/SW Contact:  Leeroy Cha, RN Phone Number: 01/05/2022, 2:34 PM   Clinical Narrative:    Dcd to home with no toc needs    Final next level of care: Home/Self Care Barriers to Discharge: Barriers Resolved   Patient Goals and CMS Choice Patient states their goals for this hospitalization and ongoing recovery are:: to go home CMS Medicare.gov Compare Post Acute Care list provided to:: Patient Choice offered to / list presented to : Patient  Discharge Placement                       Discharge Plan and Services   Discharge Planning Services: CM Consult                                 Social Determinants of Health (SDOH) Interventions     Readmission Risk Interventions No flowsheet data found.

## 2022-01-05 NOTE — Progress Notes (Signed)
°   01/05/22 1155  Mobility  Activity Ambulated with assistance in hallway  Level of Assistance Standby assist, set-up cues, supervision of patient - no hands on  Assistive Device Other (Comment) (IV pole)  Distance Ambulated (ft) 1500 ft  Activity Response Tolerated well  $Mobility charge 1 Mobility   Pt agreeable to mobilize this morning. Requested to use restroom prior to session. Ambulated about 1568ft in hall, tolerated well. No complaints. Pt did note that he just had his catheter removed this morning, and is having some initial difficulty trying to urinate. Noted he had some initial "blood clots". RN notified of this. Left pt in chair with call bell at side and family present.   [place] Specialist Acute Rehab Services Office: [phone]

## 2022-01-05 NOTE — Plan of Care (Signed)
  Problem: Activity: Goal: Risk for activity intolerance will decrease Outcome: Progressing   Problem: Nutrition: Goal: Adequate nutrition will be maintained Outcome: Progressing   Problem: Coping: Goal: Level of anxiety will decrease Outcome: Progressing   

## 2022-01-05 NOTE — Plan of Care (Signed)
°  Problem: Education: Goal: Knowledge of General Education information will improve Description: Including pain rating scale, medication(s)/side effects and non-pharmacologic comfort measures Outcome: Adequate for Discharge   Problem: Health Behavior/Discharge Planning: Goal: Ability to manage health-related needs will improve Outcome: Adequate for Discharge   Problem: Clinical Measurements: Goal: Ability to maintain clinical measurements within normal limits will improve Outcome: Adequate for Discharge Goal: Will remain free from infection Outcome: Adequate for Discharge Goal: Diagnostic test results will improve Outcome: Adequate for Discharge Goal: Respiratory complications will improve Outcome: Adequate for Discharge Goal: Cardiovascular complication will be avoided Outcome: Adequate for Discharge   Problem: Activity: Goal: Risk for activity intolerance will decrease 01/05/2022 1443 by Jerene Pitch, RN Outcome: Adequate for Discharge 01/05/2022 0810 by Jerene Pitch, RN Outcome: Progressing   Problem: Nutrition: Goal: Adequate nutrition will be maintained 01/05/2022 1443 by Jerene Pitch, RN Outcome: Adequate for Discharge 01/05/2022 0810 by Jerene Pitch, RN Outcome: Progressing   Problem: Coping: Goal: Level of anxiety will decrease 01/05/2022 1443 by Jerene Pitch, RN Outcome: Adequate for Discharge 01/05/2022 0810 by Jerene Pitch, RN Outcome: Progressing   Problem: Elimination: Goal: Will not experience complications related to bowel motility Outcome: Adequate for Discharge Goal: Will not experience complications related to urinary retention Outcome: Adequate for Discharge   Problem: Pain Managment: Goal: General experience of comfort will improve Outcome: Adequate for Discharge   Problem: Safety: Goal: Ability to remain free from injury will improve Outcome: Adequate for Discharge   Problem: Skin Integrity: Goal: Risk for impaired skin  integrity will decrease Outcome: Adequate for Discharge

## 2022-01-05 NOTE — Progress Notes (Addendum)
Provided and discussed discharge instructions. Addressed all questions and concerns. IV removed intact.  ?Jonathan Lane ? ?

## 2022-01-09 ENCOUNTER — Other Ambulatory Visit: Payer: Self-pay | Admitting: Urology

## 2022-01-20 DIAGNOSIS — R3915 Urgency of urination: Secondary | ICD-10-CM | POA: Diagnosis not present

## 2022-01-20 DIAGNOSIS — C678 Malignant neoplasm of overlapping sites of bladder: Secondary | ICD-10-CM | POA: Diagnosis not present

## 2022-01-20 DIAGNOSIS — C662 Malignant neoplasm of left ureter: Secondary | ICD-10-CM | POA: Diagnosis not present

## 2022-01-22 DIAGNOSIS — G4733 Obstructive sleep apnea (adult) (pediatric): Secondary | ICD-10-CM | POA: Diagnosis not present

## 2022-01-22 DIAGNOSIS — M2610 Unspecified anomaly of jaw-cranial base relationship: Secondary | ICD-10-CM | POA: Diagnosis not present

## 2022-01-23 ENCOUNTER — Encounter (HOSPITAL_COMMUNITY): Payer: Self-pay

## 2022-01-23 ENCOUNTER — Emergency Department (HOSPITAL_COMMUNITY)
Admission: EM | Admit: 2022-01-23 | Discharge: 2022-01-24 | Disposition: A | Discharge status: Home / Self Care | Payer: PPO | Attending: Emergency Medicine | Admitting: Emergency Medicine

## 2022-01-23 ENCOUNTER — Other Ambulatory Visit: Payer: Self-pay

## 2022-01-23 DIAGNOSIS — R339 Retention of urine, unspecified: Secondary | ICD-10-CM | POA: Diagnosis not present

## 2022-01-23 DIAGNOSIS — Z79899 Other long term (current) drug therapy: Secondary | ICD-10-CM | POA: Insufficient documentation

## 2022-01-23 DIAGNOSIS — H401132 Primary open-angle glaucoma, bilateral, moderate stage: Secondary | ICD-10-CM | POA: Diagnosis not present

## 2022-01-23 DIAGNOSIS — Z961 Presence of intraocular lens: Secondary | ICD-10-CM | POA: Diagnosis not present

## 2022-01-23 DIAGNOSIS — Z7901 Long term (current) use of anticoagulants: Secondary | ICD-10-CM | POA: Diagnosis not present

## 2022-01-23 NOTE — ED Triage Notes (Signed)
Pt had surgery 2 weeks ago and has been passing scabs with urination. Pt states that a scab is blocking his flow of urine and he needs a catheter.

## 2022-01-24 ENCOUNTER — Encounter (HOSPITAL_COMMUNITY): Payer: Self-pay | Admitting: Emergency Medicine

## 2022-01-24 MED ORDER — ACETAMINOPHEN 325 MG PO TABS
650.0000 mg | ORAL_TABLET | Freq: Once | ORAL | Status: AC
Start: 2022-01-24 — End: 2022-01-24
  Administered 2022-01-24: 650 mg via ORAL
  Filled 2022-01-24: qty 2

## 2022-01-24 NOTE — ED Notes (Signed)
Per Doctor Christy Gentles for urine sample don't collect until it clears up.

## 2022-01-24 NOTE — ED Provider Notes (Signed)
[facility] DEPT Provider Note   CSN: [id number] Arrival date & time: 01/23/22  2302     History  Chief Complaint  Patient presents with   Urinary Retention    Jonathan Lane is a 81 y.o. male.  The history is provided by the patient.  Patient presents for urinary retention. Patient reports he underwent bladder surgery in January and since that time he has been passing scabs and small amounts of blood in his urine.  Tonight he reports that he is unable to pass any urine and he is concerned that his urine flow is blocked.  No fevers or vomiting.  He does have some back pain.  No other acute complaints    Home Medications Prior to Admission medications   Medication Sig Start Date End Date Taking? Authorizing Provider  acetaminophen (TYLENOL) 500 MG tablet Take 500-1,000 mg by mouth 2 (two) times daily as needed for mild pain or headache.   Yes [provider]  Apoaequorin (PREVAGEN PO) Take 1 capsule by mouth daily. [facility].   Yes [provider]  atorvastatin (LIPITOR) 20 MG tablet Take 1 tablet (20 mg total) by mouth daily. Patient taking differently: Take 20 mg by mouth at bedtime. 08/26/19  Yes Domenic Polite, MD  clopidogrel (PLAVIX) 75 MG tablet Take 1 tablet (75 mg total) by mouth daily. 01/09/22  Yes Festus Aloe, MD  Coenzyme Q10 (CO Q 10 PO) Take 1 capsule by mouth daily.    Yes [provider]  COSOPT PF 2-0.5 % SOLN ophthalmic solution Place 1 drop into both eyes in the morning and at bedtime.   Yes [provider]  Cranberry 400 MG CAPS Take 400 mg by mouth daily.   Yes [provider]  DILT-XR 240 MG 24 hr capsule TAKE ONE CAPSULE BY MOUTH ONCE DAILY Patient taking differently: Take 240 mg by mouth at bedtime. 12/16/21  Yes Josue Hector, MD  esomeprazole (NEXIUM) 20 MG capsule Take 20 mg by mouth 2 (two) times daily before a meal.   Yes [provider]  fish  oil-omega-3 fatty acids 1000 MG capsule Take 1 g by mouth 2 (two) times daily.    Yes [provider]  fluticasone (FLONASE) 50 MCG/ACT nasal spray Place 2 sprays into both nostrils daily as needed for allergies or rhinitis.   Yes [provider]  Glucosamine-Chondroitin (MOVE FREE PO) Take 1 tablet by mouth daily.   Yes [provider]  HYDROcodone-acetaminophen (NORCO/VICODIN) 5-325 MG tablet Take 1 tablet by mouth every 4 (four) hours as needed for moderate pain. 01/03/22 01/03/23 Yes [name], MD  losartan-hydrochlorothiazide (HYZAAR) 100-25 MG tablet Take 1 tablet by mouth daily. Patient taking differently: Take 1 tablet by mouth at bedtime. 10/27/21  Yes Josue Hector, MD  Misc Natural Products [facility]) CAPS Take 1 capsule by mouth in the morning.   Yes [provider]  Multiple Vitamins-Minerals (PRESERVISION AREDS 2 PO) Take 1 capsule by mouth in the morning and at bedtime.   Yes [provider]  nitroGLYCERIN (NITROSTAT) 0.4 MG SL tablet Place 1 tablet (0.4 mg total) under the tongue every 5 (five) minutes as needed for chest pain. 08/18/19  Yes Regalado, Belkys A, MD  NON FORMULARY Take 1 tablet by mouth See admin instructions. [employer] Signature Adult Multivitamin Gummies: Chew 1 gummie by mouth once a day   Yes [provider]  Probiotic Product (ALIGN PO) Take 1 capsule by  mouth in the morning. Take daily   Yes [provider]  RHOPRESSA 0.02 % SOLN Place 1 drop into both eyes at bedtime.  01/08/18  Yes [provider]  valACYclovir (VALTREX) 1000 MG tablet Take 1,000 mg by mouth daily as needed (for outbreaks).   Yes [provider]  vitamin B-12 (CYANOCOBALAMIN) 1000 MCG tablet Take 1,000 mcg by mouth daily.   Yes [provider]  Zinc 25 MG TABS Take 25 mg by mouth daily.   Yes [provider]      Allergies    Brimonidine tartrate-timolol, Brimonidine tartrate, Cipro  [ciprofloxacin hcl], Tetracycline hcl, and Ciprofloxacin    Review of Systems   Review of Systems  Constitutional:  Negative for fever.  Gastrointestinal:  Negative for vomiting.  Genitourinary:  Positive for difficulty urinating, flank pain and hematuria.   Physical Exam Updated Vital Signs BP (!) 155/77 (BP Location: Right Arm)    Pulse 84    Temp 97.8 F (36.6 C) (Oral)    Resp 18    Ht 1.867 m (6' 1.5")    Wt 86.2 kg    SpO2 96%    BMI 24.73 kg/m  Physical Exam CONSTITUTIONAL: Elderly, uncomfortable appearing HEAD: Normocephalic/atraumatic EYES: EOMI ENMT: Mucous membranes moist NECK: supple no meningeal signs CV: S1/S2 noted, no murmurs/rubs/gallops noted LUNGS: Lungs are clear to auscultation bilaterally, no apparent distress ABDOMEN: soft, nontender, no rebound or guarding, bowel sounds noted throughout abdomen GU: Left cva tenderness Normal external genitalia noted NEURO: Pt is awake/alert/appropriate, moves all extremitiesx4.  No facial droop.   EXTREMITIES:  full ROM SKIN: warm, color normal PSYCH: no abnormalities of mood noted, alert and oriented to situation  ED Results / Procedures / Treatments   Labs (all labs ordered are listed, but only abnormal results are displayed) Labs Reviewed - No data to display   EKG None  Radiology No results found.  Procedures Procedures    Medications Ordered in ED Medications  acetaminophen (TYLENOL) tablet 650 mg (650 mg Oral Given 01/24/22 0145)    ED Course/ Medical Decision Making/ A&P Clinical Course as of 01/24/22 0353  Tue Jan 24, 2022  0144 Patient feeling improved, but has significant clots noted in the Foley bag.  He will need to be irrigated [DW]  0353 Patient improved.  After irrigation, urine is flowing still blood-tinged.  Since patient still improved, he feels comfortable discharge home.  He will call his urologist as he does have procedure scheduled in about a week.  We discussed return precautions [DW]     Clinical Course User Index [DW] Ripley Fraise, MD                           Medical Decision Making Amount and/or Complexity of Data Reviewed Labs: ordered.  Risk OTC drugs.   Patient underwent transurethral bladder resection in mid January.  He has done well since that time until tonight when he does have any urinary obstruction.  Plan to place Foley catheter to help relieve the obstruction. 3:53 AM Patient improved, he will be discharged home.  Urinalysis or culture was not sent due to gross hematuria.  Patient is afebrile has not had any other urinary symptoms. He will call his urologist later today.        Final Clinical Impression(s) / ED Diagnoses Final diagnoses:  Urinary retention    Rx / DC Orders ED Discharge Orders     None  Ripley Fraise, MD 01/24/22 [phone]

## 2022-01-24 NOTE — ED Notes (Signed)
Pt's foley flushed with 279ml of sterile water with a return of 180 ml of pink tinged fluid with blood clots; pt tolerated procedure well

## 2022-01-24 NOTE — ED Notes (Signed)
Foley flushed with 330ml of sterile water with a reture of 210ml of pink tinged fluid with visible blood clots; pt experiencing some pain with flushing.

## 2022-01-26 DIAGNOSIS — R338 Other retention of urine: Secondary | ICD-10-CM | POA: Diagnosis not present

## 2022-01-26 DIAGNOSIS — R310 Gross hematuria: Secondary | ICD-10-CM | POA: Diagnosis not present

## 2022-01-26 DIAGNOSIS — C662 Malignant neoplasm of left ureter: Secondary | ICD-10-CM | POA: Diagnosis not present

## 2022-01-26 DIAGNOSIS — C678 Malignant neoplasm of overlapping sites of bladder: Secondary | ICD-10-CM | POA: Diagnosis not present

## 2022-01-26 NOTE — Patient Instructions (Signed)
DUE TO COVID-19 ONLY ONE VISITOR IS ALLOWED TO COME WITH YOU AND STAY IN THE WAITING ROOM ONLY DURING PRE OP AND PROCEDURE.   **NO VISITORS ARE ALLOWED IN THE SHORT STAY AREA OR RECOVERY ROOM!!**  IF YOU WILL BE ADMITTED INTO THE HOSPITAL YOU ARE ALLOWED ONLY TWO SUPPORT PEOPLE DURING VISITATION HOURS ONLY (7 AM -8PM)   The support person(s) must pass our screening, gel in and out, and wear a mask at all times, including in the patients room. Patients must also wear a mask when staff or their support person are in the room. Visitors GUEST BADGE MUST BE WORN VISIBLY  One adult visitor may remain with you overnight and MUST be in the room by 8 P.M.  No visitors under the age of 28. Any visitor under the age of 45 must be accompanied by an adult.    COVID SWAB TESTING MUST BE COMPLETED ON:  01/27/22   Site: [facility] Athalia Velva Enter: Main Entrance have a seat in the waiting area to the right of main entrance (DO NOT [place]!!!!!) Dial: [phone] to alert staff you have arrived  You are not required to quarantine, however you are required to wear a well-fitted mask when you are out and around people not in your household.  Hand Hygiene often Do NOT share personal items Notify your provider if you are in close contact with someone who has COVID or you develop fever 100.4 or greater, new onset of sneezing, cough, sore throat, shortness of breath or body aches.  [place], [address], must go inside of the hospital, NOT A DRIVE THRU!  (Must self quarantine after testing. Follow instructions on handout.)       Your procedure is scheduled on: 01/31/22   Report to [facility] Main Entrance    Report to admitting at: 8:45 AM   Call this number if you have problems the morning of surgery [phone]   Do not eat food :After Midnight.   May have liquids until : 8:00 AM    day of surgery  CLEAR LIQUID DIET  Foods Allowed                                                                     Foods Excluded  Water, Black Coffee and tea, regular and decaf                             liquids that you cannot  Plain Jell-O in any flavor  (No red)                                           see through such as: Fruit ices (not with fruit pulp)                                     milk, soups, orange juice  Iced Popsicles (No red)                                    All solid food                                   Apple juices Sports drinks like Gatorade (No red) Lightly seasoned clear broth or consume(fat free) Sugar Sample Menu Breakfast                                Lunch                                     Supper Cranberry juice                    Beef broth                            Chicken broth Jell-O                                     Grape juice                           Apple juice Coffee or tea                        Jell-O                                      Popsicle                                                Coffee or tea                        Coffee or tea   Oral Hygiene is also important to reduce your risk of infection.                                    Remember - BRUSH YOUR TEETH THE MORNING OF SURGERY WITH YOUR REGULAR TOOTHPASTE   Do NOT smoke after Midnight   Take these medicines the morning of surgery with A SIP OF WATER: esomeprazole.Use eye drops and flonase as usual.  DO NOT TAKE ANY ORAL DIABETIC MEDICATIONS DAY OF YOUR SURGERY                              You may not have any metal on your body including hair pins, jewelry, and body piercing             Do not wear lotions, powders, perfumes/cologne, or deodorant  Men may shave face and neck.   Do not bring valuables to the hospital. [place].   Contacts, dentures or bridgework may not be worn into  surgery.   Bring small overnight bag day of surgery.    Patients discharged on the day of surgery will not be allowed to drive home.  Someone needs to stay with you for the first 24 hours after anesthesia.   Special Instructions: Bring a copy of your healthcare power of attorney and living will documents         the day of surgery if you haven't scanned them before.              Please read over the following fact sheets you were given: IF YOU HAVE QUESTIONS ABOUT YOUR PRE-OP INSTRUCTIONS PLEASE CALL [phone]     [facility] - Preparing for Surgery Before surgery, you can play an important role.  Because skin is not sterile, your skin needs to be as free of germs as possible.  You can reduce the number of germs on your skin by washing with CHG (chlorahexidine gluconate) soap before surgery.  CHG is an antiseptic cleaner which kills germs and bonds with the skin to continue killing germs even after washing. Please DO NOT use if you have an allergy to CHG or antibacterial soaps.  If your skin becomes reddened/irritated stop using the CHG and inform your nurse when you arrive at Short Stay. Do not shave (including legs and underarms) for at least 48 hours prior to the first CHG shower.  You may shave your face/neck. Please follow these instructions carefully:  1.  Shower with CHG Soap the night before surgery and the  morning of Surgery.  2.  If you choose to wash your hair, wash your hair first as usual with your  normal  shampoo.  3.  After you shampoo, rinse your hair and body thoroughly to remove the  shampoo.                           4.  Use CHG as you would any other liquid soap.  You can apply chg directly  to the skin and wash                       Gently with a scrungie or clean washcloth.  5.  Apply the CHG Soap to your body ONLY FROM THE NECK DOWN.   Do not use on face/ open                           Wound or open sores. Avoid contact with eyes, ears mouth and genitals (private  parts).                       Wash face,  Genitals (private parts) with your normal soap.             6.  Wash thoroughly, paying special attention to the area where your surgery  will be performed.  7.  Thoroughly rinse your body with warm water from the neck down.  8.  DO NOT shower/wash with your normal soap after using and rinsing off  the CHG Soap.  9.  Pat yourself dry with a clean towel.            10.  Wear clean pajamas.            11.  Place clean sheets on your bed the night of your first shower and do not  sleep with pets. Day of Surgery : Do not apply any lotions/deodorants the morning of surgery.  Please wear clean clothes to the hospital/surgery center.  FAILURE TO FOLLOW THESE INSTRUCTIONS MAY RESULT IN THE CANCELLATION OF YOUR SURGERY PATIENT SIGNATURE_________________________________  NURSE SIGNATURE__________________________________  ________________________________________________________________________

## 2022-01-27 ENCOUNTER — Encounter (HOSPITAL_COMMUNITY)
Admission: RE | Admit: 2022-01-27 | Discharge: 2022-01-27 | Disposition: A | Discharge status: Home / Self Care | Payer: PPO | Source: Ambulatory Visit | Attending: Urology | Admitting: Urology

## 2022-01-27 ENCOUNTER — Other Ambulatory Visit: Payer: Self-pay

## 2022-01-27 ENCOUNTER — Encounter (HOSPITAL_COMMUNITY): Payer: Self-pay

## 2022-01-27 VITALS — BP 134/71 | HR 69 | Temp 97.9°F | Ht 73.5 in

## 2022-01-27 DIAGNOSIS — I4891 Unspecified atrial fibrillation: Secondary | ICD-10-CM | POA: Diagnosis not present

## 2022-01-27 DIAGNOSIS — Z20822 Contact with and (suspected) exposure to covid-19: Secondary | ICD-10-CM | POA: Diagnosis not present

## 2022-01-27 DIAGNOSIS — Z01812 Encounter for preprocedural laboratory examination: Secondary | ICD-10-CM | POA: Diagnosis not present

## 2022-01-27 DIAGNOSIS — Z01818 Encounter for other preprocedural examination: Secondary | ICD-10-CM

## 2022-01-27 HISTORY — DX: Malignant (primary) neoplasm, unspecified: C80.1

## 2022-01-27 HISTORY — DX: Cardiac arrhythmia, unspecified: I49.9

## 2022-01-27 LAB — CBC
HCT: 36.4 % — ABNORMAL LOW (ref 39.0–52.0)
Hemoglobin: 12.2 g/dL — ABNORMAL LOW (ref 13.0–17.0)
MCH: 32.5 pg (ref 26.0–34.0)
MCHC: 33.5 g/dL (ref 30.0–36.0)
MCV: 97.1 fL (ref 80.0–100.0)
Platelets: 215 10*3/uL (ref 150–400)
RBC: 3.75 MIL/uL — ABNORMAL LOW (ref 4.22–5.81)
RDW: 13.6 % (ref 11.5–15.5)
WBC: 5.7 10*3/uL (ref 4.0–10.5)
nRBC: 0 % (ref 0.0–0.2)

## 2022-01-27 LAB — BASIC METABOLIC PANEL
Anion gap: 6 (ref 5–15)
BUN: 15 mg/dL (ref 8–23)
CO2: 29 mmol/L (ref 22–32)
Calcium: 9 mg/dL (ref 8.9–10.3)
Chloride: 101 mmol/L (ref 98–111)
Creatinine, Ser: 0.91 mg/dL (ref 0.61–1.24)
GFR, Estimated: >60 mL/min (ref >60)
Glucose, Bld: 122 mg/dL — ABNORMAL HIGH (ref 70–99)
Potassium: 3.7 mmol/L (ref 3.5–5.1)
Sodium: 136 mmol/L (ref 135–145)

## 2022-01-27 LAB — RESP PANEL BY RT-PCR (FLU A&B, COVID) ARPGX2
Influenza A by PCR: NEGATIVE
Influenza B by PCR: NEGATIVE
SARS Coronavirus 2 by RT PCR: NEGATIVE

## 2022-01-27 NOTE — Progress Notes (Addendum)
COVID Vaccine Completed: Yes Date COVID Vaccine completed: 2022 x 5 COVID vaccine manufacturer: [place] Test: 01/27/22 Bowel prep reminder: N/A  PCP - Dr. Sela Hilding Cardiologist - Dr. [name]  Chest x-ray - 12/18/21 EKG - 12/20/21 Stress Test -  ECHO - 08/18/19 Cardiac Cath -  Pacemaker/ICD device last checked:  Sleep Study - Yes CPAP - Yes  Fasting Blood Sugar -  Checks Blood Sugar _____ times a day  Blood Thinner Instructions:Plavix on hold since 01/25/22 Aspirin Instructions: Last Dose:  Anesthesia review: Hx: HTN,Afib,TIA's,OSA(CPAP),AAA  Patient denies shortness of breath, fever, cough and chest pain at PAT appointment   Patient verbalized understanding of instructions that were given to them at the PAT appointment. Patient was also instructed that they will need to review over the PAT instructions again at home before surgery.

## 2022-01-30 NOTE — H&P (Signed)
I have bladder cancer.  HPI: Jonathan Lane is a 81 year-old male established patient who is here for bladder cancer.  01/26/2022: The patient is scheduled for restaging TURBT and left ureteroscopy on 2/14. Earlier this week he paged while I was on-call endorsing signs/symptoms of urinary retention so I recommended presenting to the emergency department for evaluation. He was seen there and had a Foley catheter placed. Preceding his catheter placement, he was having hematuria with passage of clot and tissue material. This has somewhat continued so Dr. Jeffie Pollock recommended he follow-up this afternoon with me for repeat evaluation.   Hematuria has lessened in severity through today's appointment. He has not passed any clot or tissue material since around the time his appointment was made. He is not having any painful or leaking urgency. He is not having any lower back or flank pain/discomfort suggestive of obstructive uropathy. He denies interval fevers or chills, nausea/vomiting.   01/20/2022: Jonathan Lane returns today in f/u from his recent TURBT. He had extensive papillary disease of the trigone bladder neck and posterior bladder wall with tumor involving the left distal ureter and close the right. He had bilateral stents placed. His path showed T1 HG NMIBC. He is scheduled to return to the OR on 01/31/22 for restaging TURBT and left ureteroscopy with laser ablation of tumor. He is voiding with some pain. He is using tylenol up to 3000mg  daily which has been helpful. He has urgency and frequency q1-2hrs and nocturia q2hr. He has flank pain with voiding. He passed a clot 2 weeks post op but he has done well since.     ALLERGIES: Combigan SOLN Latanoprostene Bunod - damage to right eye    MEDICATIONS: Gemtesa 75 mg tablet 1 tablet PO Daily  Lipitor 10 mg tablet  Plavix  Centrum TABS Oral  Coenzyme Q10 50 mg capsule Oral  Cosopt Pf 2 %-0.5 % dropperette, single-use drop dispenser Ophthalmic   Cranberry 450 mg tablet Oral  DilTIAZem HCl ER Coated Beads 240 MG Oral Capsule Extended Release 24 Hour Oral  Fish Oil Double Strength 1200 MG Oral Capsule Oral  Glucosamine Chondroitin TABS Oral  Losartan Potassium-HCTZ 100-12.5 MG Oral Tablet Oral  Prostate Formula TABS Oral  Rhopressa 0.02 % drops  Tadalafil 5 mg tablet 1 tablet PO Daily  Tadalafil 20 mg tablet 1/2 tablet PO Daily PRN  Valtrex 1,000 mg tablet Oral     GU PSH: Cystoscopy - 12/06/2021 Cystoscopy Retrogrades - 01/03/2022 Cystoscopy TURBT >5 cm - 01/03/2022       PSH Notes: Appendectomy, Tonsillectomy   NON-GU PSH: Appendectomy - 2008 Cataract surgery, Bilateral Remove Tonsils - 2008     GU PMH: Bladder Cancer overlapping sites, 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 I will take him back on 2/14 for a restaging TURBT and left ureteroscopy with laser. I discussed subsequent options for therapy including cystectomy with diversion. Management of the left ureteral tumor with that could be a distal ureterectomy or NU depending on the findings at the next procedure. If I find limited ureteral disease and no muscle invasion on restaging and I am able to ablate the distal tumor, I think it would be worthwhile to give him induction BCG with a left ureteral stent in place and then reassess in the OR 6-8 weeks s/p BCG. I have reviewed the risks of the restaging procedure and the risks of BCG. I also discussed alternatives to BCG including intravesical chemo and Keytruda. 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 Side effects and precautions reviewed. Urine culture today. - 01/20/2022 Bladder Cancer Trigone , He has a 2x3 cm papillary lesion at the bladder base extending from midline to the area of the left UO. The left UO is not apparent. This the probably cause of his voiding symptoms  and microhematuria. I will get him set up for cystoscopy with bilateral RTG's, TURBT with possible gemcitabine and possible stent. I have reviewed the risks of bleeding, infection, bladder, urethral and ureteral injury, need for secondary procedures, chemical cystitis, thrombotic events and anesthetic complications. - 12/06/2021 Microscopic hematuria, The hematuria has cleared. - 12/06/2021, - 11/28/2021 Renal calculus, He has a 85mm non-obstructing renal stone. - 12/06/2021, He has a LLP stone with a prior history of stones but it is non-obstructing and doesn't require treatment at this time. , - 11/28/2021, Nephrolithiasis, - 2015-03-18 ED due to arterial insufficiency, He is doing well with the tadalafil and uses about 15mg  for best results. Med refilled. - 11/28/2021, He continues to use the tadalafil 5mg  daily with prn sildenafil and is doing well with voiding and a reasonable response to the tadalafil 5mg  daily with occasional additional 10mg  as needed. He will return for FirmEST as needed since that gives some benefit. , - 04/20/2021, - 11/03/2020, - 10/13/2020, 17-Mar-2020, 03/17/17, Erectile dysfunction due to arterial insufficiency, - 18-Mar-2015 Other urethritis, He has some discomfort in the terminal urethra that could be related to lubricant he is using. I offered suggestions on how to avoid that possibility, but he also has microhematuria with some pyuria of uncertain etiology. He has a LLP stone but I will have him return for cystoscopy to complete the evaluation. - 11/28/2021 Unil Inguinal Hernia W/O obst or gang,non-recurrent, The hernia is small and an incidental finding. - 11/28/2021 BPH w/LUTS - 04/20/2021, (Stable), - 2017/03/17, Benign prostatic hyperplasia with urinary obstruction, - March 18, 2015 History of urolithiasis, No hematuria or flank pain. - 04/20/2021, He has had no stones symptoms., 2019-03-18 Nocturia - 04/20/2021, 03/17/2017, Nocturia, - 03/18/15 Elevated PSA - 03/17/2017, Elevated prostate specific antigen (PSA), - 2015-03-18     NON-GU PMH: Encounter for general adult medical examination without abnormal findings, Encounter for preventive health examination - 2015/03/18 Personal history of other diseases of the circulatory system, History of hypertension - 03/17/13 Personal history of other diseases of the nervous system and sense organs, History of glaucoma - March 17, 2013    FAMILY HISTORY: Death In The Family Father - Runs In Family Death In The Family Mother - Runs In Family Family Health Status Number - Runs In Family Hypertension - Father Obesity - Father   SOCIAL HISTORY: Marital Status: Married     Notes: Previous History Of Smoking, Alcohol Use, Tobacco Use, Occupation:, Caffeine Use   REVIEW OF SYSTEMS:    GU Review Male:   Patient denies frequent urination, hard to postpone urination, burning/ pain with urination, get up at night to urinate, leakage of urine, stream starts and stops, trouble starting your stream, have to strain to urinate , erection problems, and penile pain.  Gastrointestinal (Upper):   Patient denies nausea, vomiting, and indigestion/ heartburn.  Gastrointestinal (Lower):   Patient denies diarrhea and constipation.  Constitutional:   Patient denies fever, night sweats, weight loss, and fatigue.  Skin:   Patient denies skin rash/ lesion and itching.  Eyes:   Patient denies blurred vision and double vision.  Ears/ Nose/ Throat:   Patient denies sore throat and sinus problems.  Hematologic/Lymphatic:   Patient denies swollen glands and easy bruising.  Cardiovascular:   Patient denies leg swelling and chest pains.  Respiratory:   Patient denies cough and shortness of breath.  Endocrine:   Patient denies excessive thirst.  Musculoskeletal:   Patient denies back pain and joint pain.  Neurological:   Patient denies headaches and dizziness.  Psychologic:   Patient denies depression and anxiety.   VITAL SIGNS:      01/26/2022 01:42 PM  BP 171/75 mmHg  Pulse 70 /min  Temperature 98.7 F / 37.0 C   GU  PHYSICAL EXAMINATION:    Penis: Penile foley catheter present draining grossly well to gravity. There is some old blood noted in the collection bag but I am visibly seeing grossly clear urine draining at present time.   MULTI-SYSTEM PHYSICAL EXAMINATION:    Constitutional: Well-nourished. No physical deformities. Normally developed. Good grooming.  Respiratory: Normal breath sounds. No labored breathing, no use of accessory muscles.   Cardiovascular: Regular rate and rhythm. No murmur, no gallop.   Skin: No paleness, no jaundice, no cyanosis. No lesion, no ulcer, no rash.  Neurologic / Psychiatric: Oriented to time, oriented to place, oriented to person. No depression, no anxiety, no agitation.  Gastrointestinal: No mass, no tenderness, no rigidity, non obese abdomen.  Musculoskeletal: Normal gait and station of head and neck.     Complexity of Data:  Source Of History:  Patient, Medical Record Summary  Records Review:   Pathology Reports, Previous Doctor Records, Previous Hospital Records  Urine Test Review:   Urinalysis, Urine Culture  X-Ray Review: C.T. Abdomen/Pelvis: Reviewed Films. Reviewed Report.     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 Hematuria has significantly lessened in severity but still continues to a  mild degree. He and I are both not seeing any interval passage of clot or tissue material. Given noted stability since time of catheter placement no direct or preprocedural intervention indicated at this time. I did encourage him to take it easy and continue efforts at staying well-hydrated. With interval development of pain/discomfort and increased issues with catheter drainage, I recommended contacting the office or presenting back to the emergency department in the event we are closed.   Moving forward he will keep his planned surgery date on the 14th. I spent a considerable amount of time answering questions regarding the upcoming procedure and expected postoperative course as well as tentative treatment plans moving forward. Understanding expressed by the patient and his partner  who is present today.        Next Appointment:      Next Appointment: 01/31/2022 11:00 AM    Appointment Type: Surgery     Location: [facility] [phone]    Provider: [name], M.D.    Reason for Visit: OBS WL TURBT LT URS LL LT STENT EXCHANGE RT STENT REMOVAL

## 2022-01-31 ENCOUNTER — Observation Stay (HOSPITAL_COMMUNITY)
Admission: RE | Admit: 2022-01-31 | Discharge: 2022-02-01 | Disposition: A | Discharge status: Home / Self Care | Payer: PPO | Attending: Urology | Admitting: Urology

## 2022-01-31 ENCOUNTER — Encounter (HOSPITAL_COMMUNITY): Payer: Self-pay | Admitting: Urology

## 2022-01-31 ENCOUNTER — Ambulatory Visit: Payer: PPO | Admitting: Adult Health

## 2022-01-31 ENCOUNTER — Other Ambulatory Visit: Payer: Self-pay

## 2022-01-31 ENCOUNTER — Ambulatory Visit (HOSPITAL_COMMUNITY): Payer: PPO | Admitting: Physician Assistant

## 2022-01-31 ENCOUNTER — Ambulatory Visit (HOSPITAL_BASED_OUTPATIENT_CLINIC_OR_DEPARTMENT_OTHER): Payer: PPO | Admitting: Certified Registered"

## 2022-01-31 ENCOUNTER — Observation Stay (HOSPITAL_COMMUNITY): Payer: PPO

## 2022-01-31 ENCOUNTER — Encounter (HOSPITAL_COMMUNITY)
Admission: RE | Disposition: A | Discharge status: Home / Self Care | Payer: Self-pay | Source: Home / Self Care | Attending: Urology

## 2022-01-31 DIAGNOSIS — C662 Malignant neoplasm of left ureter: Secondary | ICD-10-CM | POA: Diagnosis not present

## 2022-01-31 DIAGNOSIS — N3030 Trigonitis without hematuria: Secondary | ICD-10-CM | POA: Insufficient documentation

## 2022-01-31 DIAGNOSIS — D4959 Neoplasm of unspecified behavior of other genitourinary organ: Secondary | ICD-10-CM

## 2022-01-31 DIAGNOSIS — I10 Essential (primary) hypertension: Secondary | ICD-10-CM

## 2022-01-31 DIAGNOSIS — D494 Neoplasm of unspecified behavior of bladder: Secondary | ICD-10-CM | POA: Diagnosis not present

## 2022-01-31 DIAGNOSIS — C678 Malignant neoplasm of overlapping sites of bladder: Secondary | ICD-10-CM | POA: Diagnosis not present

## 2022-01-31 DIAGNOSIS — C670 Malignant neoplasm of trigone of bladder: Principal | ICD-10-CM | POA: Insufficient documentation

## 2022-01-31 DIAGNOSIS — Z466 Encounter for fitting and adjustment of urinary device: Secondary | ICD-10-CM | POA: Diagnosis not present

## 2022-01-31 DIAGNOSIS — N3289 Other specified disorders of bladder: Secondary | ICD-10-CM | POA: Insufficient documentation

## 2022-01-31 DIAGNOSIS — N400 Enlarged prostate without lower urinary tract symptoms: Secondary | ICD-10-CM | POA: Diagnosis not present

## 2022-01-31 DIAGNOSIS — Z7901 Long term (current) use of anticoagulants: Secondary | ICD-10-CM | POA: Insufficient documentation

## 2022-01-31 DIAGNOSIS — Z79899 Other long term (current) drug therapy: Secondary | ICD-10-CM | POA: Diagnosis not present

## 2022-01-31 DIAGNOSIS — D090 Carcinoma in situ of bladder: Secondary | ICD-10-CM | POA: Diagnosis not present

## 2022-01-31 DIAGNOSIS — N2889 Other specified disorders of kidney and ureter: Secondary | ICD-10-CM | POA: Diagnosis not present

## 2022-01-31 HISTORY — PX: TRANSURETHRAL RESECTION OF BLADDER TUMOR: SHX2575

## 2022-01-31 LAB — ABO/RH: ABO/RH(D): O POS

## 2022-01-31 LAB — TYPE AND SCREEN
ABO/RH(D): O POS
Antibody Screen: NEGATIVE

## 2022-01-31 SURGERY — TURBT (TRANSURETHRAL RESECTION OF BLADDER TUMOR)
Anesthesia: General | Laterality: Bilateral

## 2022-01-31 MED ORDER — ATORVASTATIN CALCIUM 20 MG PO TABS
20.0000 mg | ORAL_TABLET | Freq: Every day | ORAL | Status: DC
  Administered 2022-01-31: 20 mg via ORAL
  Filled 2022-01-31: qty 1

## 2022-01-31 MED ORDER — TIMOLOL MALEATE 0.5 % OP SOLN
1.0000 [drp] | Freq: Two times a day (BID) | OPHTHALMIC | Status: DC
  Administered 2022-01-31 – 2022-02-01 (×2): 1 [drp] via OPHTHALMIC
  Filled 2022-01-31: qty 5

## 2022-01-31 MED ORDER — DORZOLAMIDE HCL 2 % OP SOLN
1.0000 [drp] | Freq: Two times a day (BID) | OPHTHALMIC | Status: DC
  Administered 2022-01-31 – 2022-02-01 (×2): 1 [drp] via OPHTHALMIC
  Filled 2022-01-31: qty 10

## 2022-01-31 MED ORDER — NETARSUDIL DIMESYLATE 0.02 % OP SOLN
1.0000 [drp] | Freq: Every day | OPHTHALMIC | Status: DC
  Administered 2022-01-31: 1 [drp] via OPHTHALMIC

## 2022-01-31 MED ORDER — FLUTICASONE PROPIONATE 50 MCG/ACT NA SUSP
2.0000 | Freq: Every day | NASAL | Status: DC | PRN
  Filled 2022-01-31: qty 16

## 2022-01-31 MED ORDER — ONDANSETRON HCL 4 MG/2ML IJ SOLN: 4.0000 mg | INTRAMUSCULAR | Status: DC | PRN

## 2022-01-31 MED ORDER — LOSARTAN POTASSIUM-HCTZ 100-25 MG PO TABS: 1.0000 | ORAL_TABLET | Freq: Every day | ORAL | Status: DC

## 2022-01-31 MED ORDER — PANTOPRAZOLE SODIUM 40 MG PO TBEC
40.0000 mg | DELAYED_RELEASE_TABLET | Freq: Every day | ORAL | Status: DC
  Administered 2022-02-01: 40 mg via ORAL
  Filled 2022-01-31: qty 1

## 2022-01-31 MED ORDER — LOSARTAN POTASSIUM 50 MG PO TABS
100.0000 mg | ORAL_TABLET | Freq: Every day | ORAL | Status: DC
  Administered 2022-01-31: 100 mg via ORAL
  Filled 2022-01-31: qty 2

## 2022-01-31 MED ORDER — FENTANYL CITRATE PF 50 MCG/ML IJ SOSY
PREFILLED_SYRINGE | INTRAMUSCULAR | Status: AC
  Administered 2022-01-31: 50 ug via INTRAVENOUS
  Filled 2022-01-31: qty 3

## 2022-01-31 MED ORDER — PROPOFOL 10 MG/ML IV BOLUS
INTRAVENOUS | Status: DC | PRN
Start: 2022-01-31 — End: 2022-01-31
  Administered 2022-01-31: 100 mg via INTRAVENOUS
  Administered 2022-01-31: 30 mg via INTRAVENOUS

## 2022-01-31 MED ORDER — PROPOFOL 10 MG/ML IV BOLUS
INTRAVENOUS | Status: AC
  Filled 2022-01-31: qty 20

## 2022-01-31 MED ORDER — HYDROCHLOROTHIAZIDE 25 MG PO TABS
25.0000 mg | ORAL_TABLET | Freq: Every day | ORAL | Status: DC
  Administered 2022-01-31: 25 mg via ORAL
  Filled 2022-01-31: qty 1

## 2022-01-31 MED ORDER — DEXAMETHASONE SODIUM PHOSPHATE 10 MG/ML IJ SOLN
INTRAMUSCULAR | Status: AC
  Filled 2022-01-31: qty 1

## 2022-01-31 MED ORDER — CHLORHEXIDINE GLUCONATE 0.12 % MT SOLN: 15.0000 mL | Freq: Once | OROMUCOSAL | Status: AC

## 2022-01-31 MED ORDER — SODIUM CHLORIDE 0.9 % IR SOLN
Status: DC | PRN
  Administered 2022-01-31: 9000 mL

## 2022-01-31 MED ORDER — ACETAMINOPHEN 500 MG PO TABS
1000.0000 mg | ORAL_TABLET | Freq: Once | ORAL | Status: AC
  Administered 2022-01-31: 1000 mg via ORAL
  Filled 2022-01-31: qty 2

## 2022-01-31 MED ORDER — ROCURONIUM BROMIDE 10 MG/ML (PF) SYRINGE
PREFILLED_SYRINGE | INTRAVENOUS | Status: AC
  Filled 2022-01-31: qty 10

## 2022-01-31 MED ORDER — ONDANSETRON HCL 4 MG/2ML IJ SOLN
INTRAMUSCULAR | Status: DC | PRN
Start: 2022-01-31 — End: 2022-01-31
  Administered 2022-01-31: 4 mg via INTRAVENOUS

## 2022-01-31 MED ORDER — MELATONIN 3 MG PO TABS
6.0000 mg | ORAL_TABLET | Freq: Every evening | ORAL | Status: DC | PRN
  Administered 2022-01-31: 6 mg via ORAL
  Filled 2022-01-31: qty 2

## 2022-01-31 MED ORDER — OXYBUTYNIN CHLORIDE 5 MG PO TABS: 5.0000 mg | ORAL_TABLET | Freq: Two times a day (BID) | ORAL | Status: DC

## 2022-01-31 MED ORDER — DIPHENHYDRAMINE HCL 50 MG/ML IJ SOLN
12.5000 mg | Freq: Four times a day (QID) | INTRAMUSCULAR | Status: DC | PRN
Start: 2022-01-31 — End: 2022-02-01

## 2022-01-31 MED ORDER — CEFAZOLIN SODIUM-DEXTROSE 2-4 GM/100ML-% IV SOLN
2.0000 g | INTRAVENOUS | Status: AC
  Administered 2022-01-31: 2 g via INTRAVENOUS
  Filled 2022-01-31: qty 100

## 2022-01-31 MED ORDER — CHLORHEXIDINE GLUCONATE CLOTH 2 % EX PADS: 6.0000 | MEDICATED_PAD | Freq: Every day | CUTANEOUS | Status: DC

## 2022-01-31 MED ORDER — EPHEDRINE SULFATE-NACL 50-0.9 MG/10ML-% IV SOSY
PREFILLED_SYRINGE | INTRAVENOUS | Status: DC | PRN
  Administered 2022-01-31: 5 mg via INTRAVENOUS

## 2022-01-31 MED ORDER — HYDROCODONE-ACETAMINOPHEN 5-325 MG PO TABS
1.0000 | ORAL_TABLET | ORAL | Status: DC | PRN
  Administered 2022-01-31 (×2): 1 via ORAL
  Filled 2022-01-31 (×2): qty 1

## 2022-01-31 MED ORDER — ONDANSETRON HCL 4 MG/2ML IJ SOLN: 4.0000 mg | Freq: Once | INTRAMUSCULAR | Status: DC | PRN

## 2022-01-31 MED ORDER — FLEET ENEMA 7-19 GM/118ML RE ENEM: 1.0000 | ENEMA | Freq: Once | RECTAL | Status: DC | PRN

## 2022-01-31 MED ORDER — DORZOLAMIDE HCL-TIMOLOL MAL PF 22.3-6.8 MG/ML OP SOLN: 1.0000 [drp] | Freq: Two times a day (BID) | OPHTHALMIC | Status: DC

## 2022-01-31 MED ORDER — ZOLPIDEM TARTRATE 5 MG PO TABS: 5.0000 mg | ORAL_TABLET | Freq: Every evening | ORAL | Status: DC | PRN

## 2022-01-31 MED ORDER — FENTANYL CITRATE PF 50 MCG/ML IJ SOSY
25.0000 ug | PREFILLED_SYRINGE | INTRAMUSCULAR | Status: DC | PRN
  Administered 2022-01-31: 50 ug via INTRAVENOUS

## 2022-01-31 MED ORDER — ORAL CARE MOUTH RINSE
15.0000 mL | Freq: Once | OROMUCOSAL | Status: AC
  Administered 2022-01-31: 15 mL via OROMUCOSAL

## 2022-01-31 MED ORDER — LACTATED RINGERS IV SOLN: INTRAVENOUS | Status: DC

## 2022-01-31 MED ORDER — BISACODYL 10 MG RE SUPP: 10.0000 mg | Freq: Every day | RECTAL | Status: DC | PRN

## 2022-01-31 MED ORDER — 0.9 % SODIUM CHLORIDE (POUR BTL) OPTIME
TOPICAL | Status: DC | PRN
  Administered 2022-01-31: 1000 mL

## 2022-01-31 MED ORDER — DEXAMETHASONE SODIUM PHOSPHATE 10 MG/ML IJ SOLN
INTRAMUSCULAR | Status: DC | PRN
  Administered 2022-01-31: 4 mg via INTRAVENOUS

## 2022-01-31 MED ORDER — VIBEGRON 75 MG PO TABS
75.0000 mg | ORAL_TABLET | Freq: Every day | ORAL | Status: DC
  Administered 2022-01-31: 75 mg via ORAL

## 2022-01-31 MED ORDER — FENTANYL CITRATE (PF) 100 MCG/2ML IJ SOLN
INTRAMUSCULAR | Status: AC
  Filled 2022-01-31: qty 2

## 2022-01-31 MED ORDER — LIDOCAINE 2% (20 MG/ML) 5 ML SYRINGE
INTRAMUSCULAR | Status: DC | PRN
  Administered 2022-01-31: 60 mg via INTRAVENOUS

## 2022-01-31 MED ORDER — ACETAMINOPHEN 325 MG PO TABS
650.0000 mg | ORAL_TABLET | ORAL | Status: DC | PRN
  Administered 2022-02-01: 650 mg via ORAL
  Filled 2022-01-31: qty 2

## 2022-01-31 MED ORDER — ONDANSETRON HCL 4 MG/2ML IJ SOLN
INTRAMUSCULAR | Status: AC
  Filled 2022-01-31: qty 2

## 2022-01-31 MED ORDER — SUGAMMADEX SODIUM 200 MG/2ML IV SOLN
INTRAVENOUS | Status: DC | PRN
  Administered 2022-01-31: 200 mg via INTRAVENOUS

## 2022-01-31 MED ORDER — POTASSIUM CHLORIDE IN NACL 20-0.45 MEQ/L-% IV SOLN
INTRAVENOUS | Status: DC
  Filled 2022-01-31 (×3): qty 1000

## 2022-01-31 MED ORDER — FENTANYL CITRATE (PF) 250 MCG/5ML IJ SOLN
INTRAMUSCULAR | Status: DC | PRN
Start: 2022-01-31 — End: 2022-01-31
  Administered 2022-01-31: 25 ug via INTRAVENOUS
  Administered 2022-01-31: 50 ug via INTRAVENOUS
  Administered 2022-01-31: 25 ug via INTRAVENOUS

## 2022-01-31 MED ORDER — PHENYLEPHRINE 40 MCG/ML (10ML) SYRINGE FOR IV PUSH (FOR BLOOD PRESSURE SUPPORT)
PREFILLED_SYRINGE | INTRAVENOUS | Status: DC | PRN
  Administered 2022-01-31: 80 ug via INTRAVENOUS

## 2022-01-31 MED ORDER — HYDROMORPHONE HCL 1 MG/ML IJ SOLN: 0.5000 mg | INTRAMUSCULAR | Status: DC | PRN

## 2022-01-31 MED ORDER — DIPHENHYDRAMINE HCL 12.5 MG/5ML PO ELIX: 12.5000 mg | ORAL_SOLUTION | Freq: Four times a day (QID) | ORAL | Status: DC | PRN

## 2022-01-31 MED ORDER — SENNOSIDES-DOCUSATE SODIUM 8.6-50 MG PO TABS
1.0000 | ORAL_TABLET | Freq: Every evening | ORAL | Status: DC | PRN
  Administered 2022-01-31: 1 via ORAL
  Filled 2022-01-31: qty 1

## 2022-01-31 MED ORDER — OXYBUTYNIN CHLORIDE 5 MG PO TABS: 5.0000 mg | ORAL_TABLET | Freq: Two times a day (BID) | ORAL | Status: DC | PRN

## 2022-01-31 MED ORDER — NITROGLYCERIN 0.4 MG SL SUBL: 0.4000 mg | SUBLINGUAL_TABLET | SUBLINGUAL | Status: DC | PRN

## 2022-01-31 MED ORDER — ROCURONIUM BROMIDE 10 MG/ML (PF) SYRINGE
PREFILLED_SYRINGE | INTRAVENOUS | Status: DC | PRN
  Administered 2022-01-31: 70 mg via INTRAVENOUS

## 2022-01-31 MED ORDER — DILTIAZEM HCL ER COATED BEADS 120 MG PO CP24
240.0000 mg | ORAL_CAPSULE | Freq: Every day | ORAL | Status: DC
  Administered 2022-01-31: 240 mg via ORAL
  Filled 2022-01-31: qty 2

## 2022-01-31 SURGICAL SUPPLY — 26 items
BAG COUNTER SPONGE SURGICOUNT (BAG) ×2 IMPLANT
BAG URINE DRAIN 2000ML AR STRL (UROLOGICAL SUPPLIES) IMPLANT
BAG URO CATCHER STRL LF (MISCELLANEOUS) ×2 IMPLANT
CATH FOLEY 2WAY SLVR  5CC 20FR (CATHETERS) ×2
CATH FOLEY 2WAY SLVR 5CC 20FR (CATHETERS) IMPLANT
CATH FOLEY 3WAY 30CC 22FR (CATHETERS) IMPLANT
CATH URETL OPEN 5X70 (CATHETERS) IMPLANT
DRAPE FOOT SWITCH (DRAPES) ×2 IMPLANT
ELECT REM PT RETURN 15FT ADLT (MISCELLANEOUS) ×2 IMPLANT
GLOVE SURG POLYISO LF SZ8 (GLOVE) IMPLANT
GOWN STRL REUS W/TWL XL LVL3 (GOWN DISPOSABLE) ×2 IMPLANT
GUIDEWIRE STR DUAL SENSOR (WIRE) ×1 IMPLANT
HOLDER FOLEY CATH W/STRAP (MISCELLANEOUS) IMPLANT
KIT TURNOVER KIT A (KITS) ×2 IMPLANT
LASER FIB FLEXIVA PULSE ID 365 (Laser) ×1 IMPLANT
LOOP CUT BIPOLAR 24F LRG (ELECTROSURGICAL) IMPLANT
MANIFOLD NEPTUNE II (INSTRUMENTS) ×2 IMPLANT
PACK CYSTO (CUSTOM PROCEDURE TRAY) ×2 IMPLANT
SET IRRIG Y TYPE TUR BLADDER L (SET/KITS/TRAYS/PACK) ×2 IMPLANT
STENT URET 6FRX26 CONTOUR (STENTS) ×1 IMPLANT
SUT ETHILON 3 0 PS 1 (SUTURE) IMPLANT
SYR 30ML LL (SYRINGE) IMPLANT
SYR TOOMEY IRRIG 70ML (MISCELLANEOUS)
SYRINGE TOOMEY IRRIG 70ML (MISCELLANEOUS) IMPLANT
TUBING CONNECTING 10 (TUBING) ×2 IMPLANT
TUBING UROLOGY SET (TUBING) ×2 IMPLANT

## 2022-01-31 NOTE — Transfer of Care (Signed)
Immediate Anesthesia Transfer of Care Note  Patient: Jonathan Lane  Procedure(s) Performed: RESTAGING TRANSURETHRAL RESECTION OF BLADDER TUMOR  (TURBT) LEFT URETEROSCOPY WITH LASER LEFT STENT EXCHANGE RIGHT STENT REMOVAL (Bilateral)  Patient Location: PACU  Anesthesia Type:General  Level of Consciousness: awake, alert  and patient cooperative  Airway & Oxygen Therapy: Patient Spontanous Breathing and Patient connected to face mask oxygen  Post-op Assessment: Report given to RN and Post -op Vital signs reviewed and stable  Post vital signs: Reviewed and stable  Last Vitals:  Vitals Value Taken Time  BP 155/78 01/31/22 1230  Temp    Pulse 63 01/31/22 1230  Resp 16 01/31/22 1230  SpO2 100 % 01/31/22 1230  Vitals shown include unvalidated device data.  Last Pain:  Vitals:   01/31/22 0945  TempSrc: Oral  PainSc: 0-No pain         Complications: No notable events documented.

## 2022-01-31 NOTE — Progress Notes (Signed)
Patient arrived to unit.  He is alert, oriented, on room air, and has been eating and drinking since being in PACU.  One dose of vicodin was given for a pain score of 6 in his abdomen.  He is in NAD resting and has been oriented to room and equipment.  IS at bedside and educated.  Will continue to monitor.

## 2022-01-31 NOTE — Anesthesia Preprocedure Evaluation (Addendum)
Anesthesia Evaluation  Patient identified by MRN, date of birth, ID band Patient awake    Reviewed: Allergy & Precautions, NPO status , Patient's Chart, lab work & pertinent test results  Airway Mallampati: III  TM Distance: >3 FB Neck ROM: Full    Dental  (+) Dental Advisory Given, Teeth Intact   Pulmonary sleep apnea and Continuous Positive Airway Pressure Ventilation , former smoker,  Quit smoking 1967   Pulmonary exam normal breath sounds clear to auscultation       Cardiovascular hypertension, Pt. on medications +CHF (diastolic, normal LVEF)  Normal cardiovascular exam+ dysrhythmias (plavix LD 01/25/22) Atrial Fibrillation  Rhythm:Regular Rate:Normal  Echo 2020: 1. The left ventricle has hyperdynamic systolic function, with an  ejection fraction of >65%. The cavity size was normal. There is moderately  increased left ventricular wall thickness. Left ventricular diastolic  Doppler parameters are consistent with  impaired relaxation.  2. The right ventricle has normal systolic function. The cavity was  normal. There is no increase in right ventricular wall thickness.  3. Mild calcification of the mitral valve leaflet. There is mild mitral  annular calcification present. No evidence of mitral valve stenosis. No  significant mitral regurgitation.  4. The aortic valve is tricuspid. Mild calcification of the aortic valve.  No stenosis of the aortic valve.  5. The inferior vena cava was dilated in size with <50% respiratory  variability. No complete TR doppler jet so unable to estimate PA systolic  pressure.  6. The aortic root is normal in size and structure.  7. Trivial pericardial effusion is present.    Neuro/Psych TIAnegative psych ROS   GI/Hepatic Neg liver ROS, GERD  Medicated and Controlled,  Endo/Other  negative endocrine ROS  Renal/GU negative Renal ROS Bladder dysfunction  Bladder/ureter tumor      Musculoskeletal negative musculoskeletal ROS (+)   Abdominal   Peds  Hematology negative hematology ROS (+) hct 36.4, plt 215   Anesthesia Other Findings   Reproductive/Obstetrics negative OB ROS                            Anesthesia Physical Anesthesia Plan  ASA: 3  Anesthesia Plan: General   Post-op Pain Management: Tylenol PO (pre-op)*   Induction: Intravenous  PONV Risk Score and Plan: 2 and Ondansetron, Dexamethasone and Treatment may vary due to age or medical condition  Airway Management Planned: LMA  Additional Equipment: None  Intra-op Plan:   Post-operative Plan: Extubation in OR  Informed Consent: I have reviewed the patients History and Physical, chart, labs and discussed the procedure including the risks, benefits and alternatives for the proposed anesthesia with the patient or authorized representative who has indicated his/her understanding and acceptance.     Dental advisory given  Plan Discussed with: CRNA  Anesthesia Plan Comments:        Anesthesia Quick Evaluation

## 2022-01-31 NOTE — Anesthesia Procedure Notes (Signed)
Procedure Name: Intubation Date/Time: 01/31/2022 11:24 AM Performed by: Eben Burow, CRNA Pre-anesthesia Checklist: Patient identified, Emergency Drugs available, Suction available, Patient being monitored and Timeout performed Patient Re-evaluated:Patient Re-evaluated prior to induction Oxygen Delivery Method: Circle system utilized Preoxygenation: Pre-oxygenation with 100% oxygen Induction Type: IV induction Ventilation: Mask ventilation without difficulty Laryngoscope Size: Mac and 4 Grade View: Grade II Tube type: Oral Tube size: 7.5 mm Number of attempts: 1 Airway Equipment and Method: Stylet Placement Confirmation: ETT inserted through vocal cords under direct vision, positive ETCO2 and breath sounds checked- equal and bilateral Secured at: 23 cm Tube secured with: Tape Dental Injury: Teeth and Oropharynx as per pre-operative assessment

## 2022-01-31 NOTE — Interval H&P Note (Signed)
History and Physical Interval Note:  01/31/2022 10:28 AM  Jonathan Lane  has presented today for surgery, with the diagnosis of BLADDER AND LEFT DISTAL URETERAL TUMOR.  The various methods of treatment have been discussed with the patient and family. After consideration of risks, benefits and other options for treatment, the patient has consented to  Procedure(s): RESTAGING TRANSURETHRAL RESECTION OF BLADDER TUMOR  (TURBT) LEFT URETEROSCOPY WITH LASER LEFT STENT EXCHANGE RIGHT STENT REMOVAL (Bilateral) as a surgical intervention.  The patient's history has been reviewed, patient examined, no change in status, stable for surgery.  I have reviewed the patient's chart and labs.  Questions were answered to the patient's satisfaction.     [name]

## 2022-01-31 NOTE — Op Note (Signed)
Procedure: 1.  Cystoscopy with removal of right double-J stent. 2.  Left ureteroscopy with laser ablation of ureteral tumor and removal and replacement of left double-J stent. 3.  Restaging TUR BT of the greater than 5 cm bladder tumor of the trigone and overlapping sites. 4.  Application of fluoroscopy.  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  Postop diagnosis: Same.  Surgeon: Dr. [name].  Anesthesia: General.  Specimen: We resected bladder tumor bed with superficial and deep sections.  Drains: 6 French by 26 cm left contour double-J stent without tether and 20 French Foley catheter.  EBL: None.  Complications: None.  Indications: The patient is an 81 year old male who was recently found to have a T1 high-grade nonmuscle invasive bladder cancer involving the trigone, bladder neck, right wall and left lateral wall of the bladder along with some posterior wall.  He also had involvement of the left distal ureter and required resection of both ureteral orifice ease.  He returns now for restaging TURBT, removal of the right ureteral stent and ureteroscopy with laser ablation of left distal ureteral tumors with stent exchange.  Procedure: He was taken operating room was given antibiotic.  A general anesthetic was induced with paralytic agent.  He was placed in lithotomy position and fitted with PAS hose.  His perineum and genitalia were prepped with Betadine solution he was draped in usual sterile fashion.  Cystoscopy was performed and a 61 [place] scope and 30 degree lens.  Examination revealed a normal urethra.  The external sphincter was intact.  The prostatic urethra was 2 to 3 cm in length with some lateral lobe hyperplasia and small middle lobe that had some inflammatory changes from the recent resection.  Examination of bladder revealed a TUR defect at the base the bladder  with bilateral ureteral stents.  There was some stent irritation on the posterior wall but no remote papillary lesions were identified.  Grasping forceps were used to remove the right ureteral stent.  The left ureteral stent was then pulled to the urethral meatus and a sensor wire was advanced the kidney under fluoroscopic guidance.  The stent was then removed.  The dual-lumen short semirigid ureteroscope was then passed alongside the wire and the ureter was inspected.  There was some papillary tumor fronds in the distal 1 to 2 cm of the ureter but nothing up to 10 cm proximal to this level of tumor.  A 265 m holmium laser fiber was passed and the Moses laser was placed on the ablation setting and the tumor fronds were ablated however it is difficult to say that was complete ablation due to some bleeding and coagulated changes of the tissues.  The ureteroscope was then removed and the 26 French continuous-flow resectoscope sheath was then reinserted with the aid of visual obturator alongside the wire.  The scope was then fitted with an Beatrix Fetters handle with a bipolar loop and 30 degree lens.  Saline was used as irrigant.  The tumor bed was then widely resected with inclusion of some surrounding margin of tissue down to the muscle.  Great care was taken to avoid injury to the widely patent right ureteral orifice however resection up to and including part of the left ureteral orifice which had previously been resected was performed.  The initial chips were removed and hemostasis was achieved.  A second layer of resection was then performed to ensure deep biopsies  were obtained.  The area of resection was approximately 4 x 6 cm in size when complete.  Once hemostasis was confirmed and all chips were noted to be removed, the scope was removed and the cystoscope was then reinserted over the wire.  A 6 French by 26 cm contour double-J stent without tether was then advanced the kidney under fluoroscopic guidance.   The wire was removed, leaving a good coil in the kidney and a good coil in the bladder.  The cystoscope was removed and a 20 French Foley catheter was inserted.  The balloon was filled with 10 mL of sterile fluid.  The catheter had clear return.  It was placed to straight drainage.  He was taken down from lithotomy position, his anesthetic was reversed and he was moved to recovery in stable condition.  There were no complications.

## 2022-01-31 NOTE — Anesthesia Postprocedure Evaluation (Signed)
Anesthesia Post Note  Patient: Dietrick Barris [place]  Procedure(s) Performed: RESTAGING TRANSURETHRAL RESECTION OF BLADDER TUMOR  (TURBT) LEFT URETEROSCOPY WITH LASER LEFT STENT EXCHANGE RIGHT STENT REMOVAL (Bilateral)     Patient location during evaluation: PACU Anesthesia Type: General Level of consciousness: awake and alert, oriented and patient cooperative Pain management: pain level controlled Vital Signs Assessment: post-procedure vital signs reviewed and stable Respiratory status: spontaneous breathing, nonlabored ventilation and respiratory function stable Cardiovascular status: blood pressure returned to baseline and stable Postop Assessment: no apparent nausea or vomiting Anesthetic complications: no   No notable events documented.  Last Vitals:  Vitals:   01/31/22 1330 01/31/22 1345  BP: (!) 172/79 (!) 175/88  Pulse: 66 69  Resp: (!) 21 17  Temp:    SpO2: 99% 100%    Last Pain:  Vitals:   01/31/22 1345  TempSrc:   PainSc: [place]

## 2022-01-31 NOTE — Plan of Care (Signed)
°  Problem: Bowel/Gastric: Goal: Gastrointestinal status for postoperative course will improve Outcome: Progressing   Problem: Health Behavior/Discharge Planning: Goal: Identification of resources available to assist in meeting health care needs will improve Outcome: Progressing   Problem: Education: Goal: Knowledge of General Education information will improve Description: Including pain rating scale, medication(s)/side effects and non-pharmacologic comfort measures Outcome: Progressing   Problem: Health Behavior/Discharge Planning: Goal: Ability to manage health-related needs will improve Outcome: Progressing

## 2022-02-01 ENCOUNTER — Encounter (HOSPITAL_COMMUNITY): Payer: Self-pay | Admitting: Urology

## 2022-02-01 DIAGNOSIS — N400 Enlarged prostate without lower urinary tract symptoms: Secondary | ICD-10-CM | POA: Diagnosis not present

## 2022-02-01 DIAGNOSIS — C670 Malignant neoplasm of trigone of bladder: Secondary | ICD-10-CM | POA: Diagnosis not present

## 2022-02-01 DIAGNOSIS — N2889 Other specified disorders of kidney and ureter: Secondary | ICD-10-CM | POA: Diagnosis not present

## 2022-02-01 DIAGNOSIS — C678 Malignant neoplasm of overlapping sites of bladder: Secondary | ICD-10-CM | POA: Diagnosis not present

## 2022-02-01 LAB — SURGICAL PATHOLOGY

## 2022-02-01 NOTE — Discharge Summary (Signed)
Patient ID: Jonathan Lane MRN: [id number] DOB/AGE: 06-24-1941 81 y.o.  Admit date: 01/31/2022 Discharge date: 02/01/2022  Primary Care Physician:  Glenis Smoker, MD  Discharge Diagnoses:   Present on Admission:  Cancer of overlapping sites of bladder [facility])    Discharge Medications: Allergies as of 02/01/2022       Reactions   Brimonidine Tartrate-timolol Itching, Rash, Other (See Comments)   Itching eyes   Brimonidine Tartrate Other (See Comments)   Irritates the eyes and increased pressure   Tetracycline Hcl Other (See Comments)   Doesn't remember reaction-"like 55 years ago"   Ciprofloxacin Other (See Comments)   Constipation        Medication List     TAKE these medications    acetaminophen 500 MG tablet Commonly known as: TYLENOL Take 500-1,000 mg by mouth 2 (two) times daily as needed for mild pain or headache.   ALIGN PO Take 1 capsule by mouth in the morning. Take daily   atorvastatin 20 MG tablet Commonly known as: LIPITOR Take 1 tablet (20 mg total) by mouth daily. What changed: when to take this   clopidogrel 75 MG tablet Commonly known as: PLAVIX Take 1 tablet (75 mg total) by mouth daily.   CO Q 10 PO Take 1 capsule by mouth daily.   Cosopt PF 22.3-6.8 MG/ML Soln ophthalmic solution Generic drug: dorzolamidel-timolol Place 1 drop into both eyes in the morning and at bedtime.   Cranberry 400 MG Caps Take 400 mg by mouth daily.   Dilt-XR 240 MG 24 hr capsule Generic drug: diltiazem TAKE ONE CAPSULE BY MOUTH ONCE DAILY What changed:  how much to take when to take this   esomeprazole 20 MG capsule Commonly known as: NEXIUM Take 20 mg by mouth 2 (two) times daily before a meal.   fish oil-omega-3 fatty acids 1000 MG capsule Take 1 g by mouth 2 (two) times daily.   fluticasone 50 MCG/ACT nasal spray Commonly known as: FLONASE Place 2 sprays into both nostrils daily as needed for allergies or rhinitis.   Gemtesa 75 MG  Tabs Generic drug: Vibegron Take 75 mg by mouth daily.   HYDROcodone-acetaminophen 5-325 MG tablet Commonly known as: NORCO/VICODIN Take 1 tablet by mouth every 4 (four) hours as needed for moderate pain.   losartan-hydrochlorothiazide 100-25 MG tablet Commonly known as: HYZAAR Take 1 tablet by mouth daily. What changed: when to take this   MOVE FREE PO Take 1 tablet by mouth daily.   nitroGLYCERIN 0.4 MG SL tablet Commonly known as: NITROSTAT Place 1 tablet (0.4 mg total) under the tongue every 5 (five) minutes as needed for chest pain.   NON FORMULARY Take 1 tablet by mouth See admin instructions. [place] Signature Adult Multivitamin Gummies: Chew 1 gummie by mouth once a day   PRESERVISION AREDS 2 PO Take 1 capsule by mouth in the morning and at bedtime.   PREVAGEN PO Take 1 capsule by mouth daily. Focus Health and memory.   Prostate Health Caps Take 1 capsule by mouth in the morning.   Rhopressa 0.02 % Soln Generic drug: Netarsudil Dimesylate Place 1 drop into both eyes at bedtime.   valACYclovir 1000 MG tablet Commonly known as: VALTREX Take 1,000 mg by mouth daily as needed (for outbreaks).   vitamin B-12 1000 MCG tablet Commonly known as: CYANOCOBALAMIN Take 1,000 mcg by mouth daily.   Zinc 25 MG Tabs Take 25 mg by mouth daily.         Significant  Diagnostic Studies:  DG C-Arm 1-60 Min-No Report  Result Date: 01/31/2022 Fluoroscopy was utilized by the requesting physician.  No radiographic interpretation.    Brief H and P: For complete details please refer to admission H and P, but in brief pt admitted for repeat TURBT  Hospital Course: No postop prpblems--he passed TOV POD 1 and was discharged Principal Problem:   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[place])   Day of Discharge BP 136/63 (BP Location: Right Arm)    Pulse (!) 58    Temp 97.7 F (36.5 C) (Oral)    Resp 18    Ht 6\' 1"  (1.854 m)    Wt 86 kg    SpO2 95%    BMI 25.01 kg/m    Results for orders placed or performed during the hospital encounter of 01/31/22 (from the past 24 hour(s))  Surgical pathology     Status: None   Collection Time: 01/31/22 12:11 PM  Result Value Ref Range   SURGICAL PATHOLOGY      SURGICAL PATHOLOGY CASE: [id number] PATIENT: Jonathan Lane Surgical Pathology Report     Clinical History: Bladder and left distal ureteral tumor (crm)     FINAL MICROSCOPIC DIAGNOSIS:  A. 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 BLADDER, DEEP TUMOR BASE, BIOPSY: Submucosa and muscularis propria with chronic inflammation and ischemic necrosis   GROSS DESCRIPTION:  Specimen A: Received fresh is a 2.5 x 2.4 x 0.5 cm aggregate of pink-white to dark red soft to rubbery tissue, entirely submitted in 2 blocks.  Specimen B: Received fresh is a 1.6 x 1.3 x 0.3 cm aggregate of pink-white to dark red-brown soft to rubbery tissue, entirely submitted in 1 block.  SW 01/31/2022   Final Diagnosis performed by Casimer Lanius, MD.   Electronically signed 02/01/2022 Colon Flattery and / or Professional components performed at [facility], [place] [address] [place], [place] [address].  Immunohistochemistry Technical component (if applicable) was performed at [facility]. [address], [place], [place], [place] [address].   IMMUNOHISTOCHEMISTRY DISCLAIMER (if applicable): Some of these immunohistochemical stains may have been developed and the performance characteristics determine by [facility]. Some may not have been cleared or approved by the U.S. Food and Drug Administration. The FDA has determined that such clearance or approval is not necessary. This test is used for clinical purposes. It should  not be regarded as investigational or for research. This laboratory is certified under the [place] (CLIA-88) as qualified to perform high complexity clinical laboratory testing.  The controls stained appropriately.     Physical Exam: General: Alert and awake oriented x3 not in any acute distress. HEENT: anicteric sclera, pupils reactive to light and accommodation CVS: S1-S2 clear no murmur rubs or gallops Chest: clear to auscultation bilaterally, no wheezing rales or rhonchi Abdomen: soft nontender, nondistended, normal bowel sounds, no organomegaly Extremities: no cyanosis, clubbing or edema noted bilaterally Neuro: Cranial nerves II-XII intact, no focal neurological deficits  Disposition:  Home  Diet:  Regular  Activity:  Discussed w/ pt   TESTS THAT NEED FOLLOW-UP   Patjology  DISCHARGE FOLLOW-UP   Follow-up Information     [name], MD Follow up on 02/10/2022.   Specialty: Urology Why: 1pm Contact information: [place] [address] [phone]  Time spent on Discharge:   15 mins  Signed: Lillette Boxer Cherrell Lane 02/01/2022, 11:21 AM

## 2022-02-01 NOTE — TOC Initial Note (Signed)
Transition of Care [facility]) - Initial/Assessment Note    Patient Details  Name: Jonathan Lane MRN: [id number] Date of Birth: 1941-01-28  Transition of Care [facility]) CM/SW Contact:    Leeroy Cha, RN Phone Number: 02/01/2022, 8:10 AM  Clinical Narrative:                  Transition of Care [facility]) Screening Note   Patient Details  Name: Jonathan Lane Date of Birth: 1941-05-25   Transition of Care [facility]) CM/SW Contact:    Leeroy Cha, RN Phone Number: 02/01/2022, 8:10 AM    Transition of Care [facility]) has reviewed patient and no TOC needs have been identified at this time. We will continue to monitor patient advancement through interdisciplinary progression rounds. If new patient transition needs arise, please place a TOC consult.    Expected Discharge Plan: Home/Self Care Barriers to Discharge: Continued Medical Work up   Patient Goals and CMS Choice Patient states their goals for this hospitalization and ongoing recovery are:: to go home CMS Medicare.gov Compare Post Acute Care list provided to:: Patient    Expected Discharge Plan and Services Expected Discharge Plan: Home/Self Care   Discharge Planning Services: CM Consult   Living arrangements for the past 2 months: Single Family Home                                      Prior Living Arrangements/Services Living arrangements for the past 2 months: Single Family Home Lives with:: Self (widowed)   Do you feel safe going back to the place where you live?: Yes               Activities of Daily Living Home Assistive Devices/Equipment: CPAP, Eyeglasses ADL Screening (condition at time of admission) Patient's cognitive ability adequate to safely complete daily activities?: Yes Is the patient deaf or have difficulty hearing?: No Does the patient have difficulty seeing, even when wearing glasses/contacts?: No Does the patient have difficulty concentrating, remembering, or making  decisions?: No Patient able to express need for assistance with ADLs?: Yes Does the patient have difficulty dressing or bathing?: No Independently performs ADLs?: Yes (appropriate for developmental age) Does the patient have difficulty walking or climbing stairs?: No Weakness of Legs: None Weakness of Arms/Hands: None  Permission Sought/Granted                  Emotional Assessment Appearance:: Appears stated age     Orientation: : Oriented to Self, Oriented to Place, Oriented to  Time, Oriented to Situation Alcohol / Substance Use: Not Applicable Psych Involvement: No (comment)  Admission diagnosis:  Cancer of overlapping sites of bladder [facility]) [C67.8] Patient Active Problem List   Diagnosis Date Noted   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[place]) 01/31/2022   Bladder cancer ([place]) 01/03/2022   TIA (transient ischemic attack) 08/25/2019   Hypokalemia 08/18/2019   Obstructive sleep apnea 01/22/2019   Gastroesophageal reflux disease 05/22/2018   Sore throat 05/22/2018   RLQ abdominal pain 03/15/2018   Chest pain 01/29/2018   OSA on CPAP 10/11/2015   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 [place] [address] Phone: [phone] Fax: [phone]     Social Determinants of Health  (SDOH) Interventions    Readmission Risk Interventions No flowsheet data found.

## 2022-02-01 NOTE — Progress Notes (Signed)
Pt given and explained discharge instructions, including when he will follow up with urology in the office will be. All questions answered. IV removed. Pt dressed in personal clothing and taken to the main entrance via wheelchair.

## 2022-02-10 DIAGNOSIS — C662 Malignant neoplasm of left ureter: Secondary | ICD-10-CM | POA: Diagnosis not present

## 2022-02-10 DIAGNOSIS — C678 Malignant neoplasm of overlapping sites of bladder: Secondary | ICD-10-CM | POA: Diagnosis not present

## 2022-02-19 DIAGNOSIS — M2610 Unspecified anomaly of jaw-cranial base relationship: Secondary | ICD-10-CM | POA: Diagnosis not present

## 2022-02-19 DIAGNOSIS — G4733 Obstructive sleep apnea (adult) (pediatric): Secondary | ICD-10-CM | POA: Diagnosis not present

## 2022-02-21 DIAGNOSIS — H43813 Vitreous degeneration, bilateral: Secondary | ICD-10-CM | POA: Diagnosis not present

## 2022-02-21 DIAGNOSIS — H353222 Exudative age-related macular degeneration, left eye, with inactive choroidal neovascularization: Secondary | ICD-10-CM | POA: Diagnosis not present

## 2022-02-21 DIAGNOSIS — H43393 Other vitreous opacities, bilateral: Secondary | ICD-10-CM | POA: Diagnosis not present

## 2022-02-21 DIAGNOSIS — H353211 Exudative age-related macular degeneration, right eye, with active choroidal neovascularization: Secondary | ICD-10-CM | POA: Diagnosis not present

## 2022-02-22 ENCOUNTER — Ambulatory Visit: Payer: PPO | Admitting: Adult Health

## 2022-02-28 DIAGNOSIS — Z5111 Encounter for antineoplastic chemotherapy: Secondary | ICD-10-CM | POA: Diagnosis not present

## 2022-02-28 DIAGNOSIS — C678 Malignant neoplasm of overlapping sites of bladder: Secondary | ICD-10-CM | POA: Diagnosis not present

## 2022-03-03 ENCOUNTER — Encounter: Payer: Self-pay | Admitting: Adult Health

## 2022-03-03 ENCOUNTER — Ambulatory Visit: Payer: PPO | Admitting: Adult Health

## 2022-03-03 VITALS — BP 127/62 | HR 46 | Ht 74.0 in | Wt 197.6 lb

## 2022-03-03 DIAGNOSIS — G4733 Obstructive sleep apnea (adult) (pediatric): Secondary | ICD-10-CM | POA: Diagnosis not present

## 2022-03-03 DIAGNOSIS — Z9989 Dependence on other enabling machines and devices: Secondary | ICD-10-CM

## 2022-03-03 NOTE — Patient Instructions (Signed)
Continue using CPAP nightly and greater than 4 hours each night °If your symptoms worsen or you develop new symptoms please let us know.  ° °

## 2022-03-03 NOTE — Progress Notes (Signed)
? ? ?PATIENT: Jonathan Lane ?DOB: 04/11/1941 ? ?REASON FOR VISIT: follow up ?HISTORY FROM: patient ? ?HISTORY OF PRESENT ILLNESS: ?Today 03/03/22: ? ?Jonathan Lane is an 81 year old male with a history of obstructive sleep apnea on CPAP.  He returns today for follow-up.  He got a new CPAP machine.  He states everything is working well.  Occasionally his mask will leak but he is able to tighten the straps and this usually resolves.  He is not happy with his DME company.  States that they are unable to answer his billing questions. ? ? ? ?05/11/21: Jonathan Lane is an 81 year old male with a history of obstructive sleep apnea on CPAP.  He reports that the CPAP continues to work well for him.  He continues to see the benefit.  He returns today for an evaluation. ? ? ? ?I  ? ?05/05/20: 6 Jonathan Lane is a 81 year old male with a history of obstructive sleep apnea on CPAP.  He returns today for follow-up.  His download indicates that he uses machine nightly for compliance of 100%.  He uses machine greater than 4 hours each night.  On average he uses his machine 7 hours and 48 minutes.  His residual AHI is 1.4 on 14 cm of water with EPR 3.  Leak in the 95th percentile is 45.6 L/min.  He reports that his straps give out before he is able to get new ones.  This causes his leak.  He returns today for an evaluation. ? ?HISTORY 05/08/19: ?  ?Jonathan Lane is a 81 year old male with a history of obstructive sleep apnea on CPAP.  He returns today for a virtual visit.  His download indicates that he uses machine nightly for compliance of 100%.  He uses machine greater than 4 hours each night.  On average he uses his machine 7 hours and 42 minutes.  His residual AHI is 1.1 on 4 to 10 cm of water with EPR 3.  He does have a leak in the 95th percentile at 45.6 L/min.  He states he is currently wearing the nasal pillows.  He states he is probably due for new supplies.  He also reports that he does feel the mask leaking.  He joins me  today for a virtual visit ? ?REVIEW OF SYSTEMS: Out of a complete 14 system review of symptoms, the patient complains only of the following symptoms, and all other reviewed systems are negative. ? ?ESS 2 ?FSS 12 ? ?ALLERGIES: ?Allergies  ?Allergen Reactions  ? Brimonidine Tartrate-Timolol Itching, Rash and Other (See Comments)  ?  Itching eyes  ? Brimonidine Tartrate Other (See Comments)  ?  Irritates the eyes and increased pressure  ? Tetracycline Hcl Other (See Comments)  ?  Doesn't remember reaction-"like 55 years ago" ?  ? Ciprofloxacin Other (See Comments)  ?  Constipation  ? ? ?HOME MEDICATIONS: ?Outpatient Medications Prior to Visit  ?Medication Sig Dispense Refill  ? acetaminophen (TYLENOL) 500 MG tablet Take 500-1,000 mg by mouth 2 (two) times daily as needed for mild pain or headache.    ? Apoaequorin (PREVAGEN PO) Take 1 capsule by mouth daily. Focus Health and memory.    ? atorvastatin (LIPITOR) 20 MG tablet Take 1 tablet (20 mg total) by mouth daily. (Patient taking differently: Take 20 mg by mouth at bedtime.) 30 tablet 0  ? clopidogrel (PLAVIX) 75 MG tablet Take 1 tablet (75 mg total) by mouth daily.    ? Coenzyme Q10 (CO Q  10 PO) Take 1 capsule by mouth daily.     ? COSOPT PF 2-0.5 % SOLN ophthalmic solution Place 1 drop into both eyes in the morning and at bedtime.    ? Cranberry 400 MG CAPS Take 400 mg by mouth daily.    ? DILT-XR 240 MG 24 hr capsule TAKE ONE CAPSULE BY MOUTH ONCE DAILY (Patient taking differently: Take 240 mg by mouth at bedtime.) 90 capsule 3  ? esomeprazole (NEXIUM) 20 MG capsule Take 20 mg by mouth 2 (two) times daily before a meal.    ? fish oil-omega-3 fatty acids 1000 MG capsule Take 1 g by mouth 2 (two) times daily.     ? fluticasone (FLONASE) 50 MCG/ACT nasal spray Place 2 sprays into both nostrils daily as needed for allergies or rhinitis.    ? Glucosamine-Chondroitin (MOVE FREE PO) Take 1 tablet by mouth daily.    ? losartan-hydrochlorothiazide (HYZAAR) 100-25 MG tablet  Take 1 tablet by mouth daily. (Patient taking differently: Take 1 tablet by mouth at bedtime.) 90 tablet 0  ? Misc Natural Products (PROSTATE HEALTH) CAPS Take 1 capsule by mouth in the morning.    ? Multiple Vitamins-Minerals (PRESERVISION AREDS 2 PO) Take 1 capsule by mouth in the morning and at bedtime.    ? nitroGLYCERIN (NITROSTAT) 0.4 MG SL tablet Place 1 tablet (0.4 mg total) under the tongue every 5 (five) minutes as needed for chest pain. 10 tablet 1  ? NON FORMULARY Take 1 tablet by mouth See admin instructions. [place] Signature Adult Multivitamin Gummies: Chew 1 gummie by mouth once a day    ? Probiotic Product (ALIGN PO) Take 1 capsule by mouth in the morning. Take daily    ? RHOPRESSA 0.02 % SOLN Place 1 drop into both eyes at bedtime.     ? UNABLE TO FIND Med Name: VCG used in bladder for 6 wks. (Bladder cancer) imuunotherapy.    ? Vibegron (GEMTESA) 75 MG TABS Take 75 mg by mouth daily.    ? vitamin B-12 (CYANOCOBALAMIN) 1000 MCG tablet Take 1,000 mcg by mouth daily.    ? Zinc 25 MG TABS Take 25 mg by mouth daily.    ? valACYclovir (VALTREX) 1000 MG tablet Take 1,000 mg by mouth daily as needed (for outbreaks). (Patient not taking: Reported on 03/03/2022)    ? HYDROcodone-acetaminophen (NORCO/VICODIN) 5-325 MG tablet Take 1 tablet by mouth every 4 (four) hours as needed for moderate pain. 6 tablet 0  ? ?No facility-administered medications prior to visit.  ? ? ?PAST MEDICAL HISTORY: ?Past Medical History:  ?Diagnosis Date  ? AAA (abdominal aortic aneurysm)   ? Atrial fibrillation ([place])   ? Bradycardia   ? Bradycardia   ? Cancer [facility])   ? Cardiac murmur   ? Dizziness   ? Dysrhythmia   ? GERD (gastroesophageal reflux disease)   ? Glaucoma   ? Heart palpitations   ? Hyperlipidemia   ? Hypertension   ? Numbness 08/26/2019  ? LEFT FACE  ? Skin abnormalities   ? pre cancerous lesion R hand  ? Sleep apnea   ? uses cpap  ? Snoring   ? ? ?PAST SURGICAL HISTORY: ?Past Surgical History:  ?Procedure Laterality  Date  ? APPENDECTOMY  12/19/1963  ? COLONOSCOPY    ? POLYPECTOMY    ? SKIN CANCER EXCISION    ? TONSILLECTOMY    ? as a child  ? 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  ? Procedure: RESTAGING  TRANSURETHRAL RESECTION OF BLADDER TUMOR  (TURBT) LEFT URETEROSCOPY WITH LASER LEFT STENT EXCHANGE RIGHT STENT REMOVAL;  Surgeon: [name], MD;  Location: [facility];  Service: Urology;  Laterality: Bilateral;  ? TRANSURETHRAL RESECTION OF BLADDER TUMOR WITH MITOMYCIN-C Bilateral 01/03/2022  ? Procedure: CYSTOSCOPY TRANSURETHRAL RESECTION OF BLADDER TUMOR WITH POSSIBLE  BILATERAL RETROGRADES STENT PLACEMENT;  Surgeon: [name], MD;  Location: [facility];  Service: Urology;  Laterality: Bilateral;  ? WISDOM TOOTH EXTRACTION    ? ? ?FAMILY HISTORY: ?Family History  ?Problem Relation Age of Onset  ? Stroke Father   ? Stroke Mother   ? Stroke Sister   ? Breast cancer Sister   ? High blood pressure Sister   ? Diabetes Sister   ? Colon cancer Other   ?     Uncle  ? Colon cancer Paternal Uncle   ? Mitral valve prolapse Other   ?     sugery 09-27-2017  ? Rectal cancer Neg Hx   ? Stomach cancer Neg Hx   ? ? ?SOCIAL HISTORY: ?Social History  ? ?Socioeconomic History  ? Marital status: Widowed  ?  Spouse name: [name]  ? Number of children: 1  ? Years of education: Masters  ? Highest education level: Not on file  ?Occupational History  ?  Employer: RETIRED  ?Tobacco Use  ? Smoking status: Former  ?  Types: Cigarettes  ?  Quit date: 12/18/1965  ?  Years since quitting: 56.2  ? Smokeless tobacco: Never  ?Vaping Use  ? Vaping Use: Never used  ?Substance and Sexual Activity  ? Alcohol use: Not Currently  ?  Comment: 5-7 drinks per week  ? Drug use: No  ? Sexual activity: Not on file  ?Other Topics Concern  ? Not on file  ?Social History Narrative  ? Patient is married Charity fundraiser).  ? Patient is retired.  ? Patient has one adult child.  ? Patient does not drink any caffeine.  ? Patient is right-handed.  ? Patient has a Consulting civil engineer.  ?   ?   ?   ? ?Social Determinants of Health  ? ?Financial Resource Strain: Not on file  ?Food Insecurity: Not on file  ?Transportation Needs: Not on file  ?Physical Activity: Not on file  ?[place]

## 2022-03-07 ENCOUNTER — Other Ambulatory Visit: Payer: Self-pay | Admitting: Cardiovascular Disease

## 2022-03-07 DIAGNOSIS — K219 Gastro-esophageal reflux disease without esophagitis: Secondary | ICD-10-CM | POA: Diagnosis not present

## 2022-03-07 DIAGNOSIS — Z5111 Encounter for antineoplastic chemotherapy: Secondary | ICD-10-CM | POA: Diagnosis not present

## 2022-03-07 DIAGNOSIS — I10 Essential (primary) hypertension: Secondary | ICD-10-CM | POA: Diagnosis not present

## 2022-03-07 DIAGNOSIS — N400 Enlarged prostate without lower urinary tract symptoms: Secondary | ICD-10-CM | POA: Diagnosis not present

## 2022-03-07 DIAGNOSIS — C678 Malignant neoplasm of overlapping sites of bladder: Secondary | ICD-10-CM | POA: Diagnosis not present

## 2022-03-07 DIAGNOSIS — E782 Mixed hyperlipidemia: Secondary | ICD-10-CM | POA: Diagnosis not present

## 2022-03-14 DIAGNOSIS — Z5111 Encounter for antineoplastic chemotherapy: Secondary | ICD-10-CM | POA: Diagnosis not present

## 2022-03-14 DIAGNOSIS — C678 Malignant neoplasm of overlapping sites of bladder: Secondary | ICD-10-CM | POA: Diagnosis not present

## 2022-03-15 DIAGNOSIS — D485 Neoplasm of uncertain behavior of skin: Secondary | ICD-10-CM | POA: Diagnosis not present

## 2022-03-15 DIAGNOSIS — D0461 Carcinoma in situ of skin of right upper limb, including shoulder: Secondary | ICD-10-CM | POA: Diagnosis not present

## 2022-03-15 DIAGNOSIS — L570 Actinic keratosis: Secondary | ICD-10-CM | POA: Diagnosis not present

## 2022-03-21 DIAGNOSIS — C678 Malignant neoplasm of overlapping sites of bladder: Secondary | ICD-10-CM | POA: Diagnosis not present

## 2022-03-22 DIAGNOSIS — M2610 Unspecified anomaly of jaw-cranial base relationship: Secondary | ICD-10-CM | POA: Diagnosis not present

## 2022-03-22 DIAGNOSIS — G4733 Obstructive sleep apnea (adult) (pediatric): Secondary | ICD-10-CM | POA: Diagnosis not present

## 2022-03-28 DIAGNOSIS — Z5111 Encounter for antineoplastic chemotherapy: Secondary | ICD-10-CM | POA: Diagnosis not present

## 2022-03-28 DIAGNOSIS — C678 Malignant neoplasm of overlapping sites of bladder: Secondary | ICD-10-CM | POA: Diagnosis not present

## 2022-04-04 DIAGNOSIS — C678 Malignant neoplasm of overlapping sites of bladder: Secondary | ICD-10-CM | POA: Diagnosis not present

## 2022-04-04 DIAGNOSIS — Z5111 Encounter for antineoplastic chemotherapy: Secondary | ICD-10-CM | POA: Diagnosis not present

## 2022-04-11 DIAGNOSIS — C678 Malignant neoplasm of overlapping sites of bladder: Secondary | ICD-10-CM | POA: Diagnosis not present

## 2022-04-11 DIAGNOSIS — Z5111 Encounter for antineoplastic chemotherapy: Secondary | ICD-10-CM | POA: Diagnosis not present

## 2022-04-19 DIAGNOSIS — D0461 Carcinoma in situ of skin of right upper limb, including shoulder: Secondary | ICD-10-CM | POA: Diagnosis not present

## 2022-04-25 DIAGNOSIS — C678 Malignant neoplasm of overlapping sites of bladder: Secondary | ICD-10-CM | POA: Diagnosis not present

## 2022-04-25 DIAGNOSIS — C662 Malignant neoplasm of left ureter: Secondary | ICD-10-CM | POA: Diagnosis not present

## 2022-04-26 ENCOUNTER — Telehealth: Payer: Self-pay | Admitting: Cardiovascular Disease

## 2022-04-26 ENCOUNTER — Other Ambulatory Visit: Payer: Self-pay | Admitting: Urology

## 2022-04-26 NOTE — Telephone Encounter (Signed)
? ?  Name: Jonathan Lane  ?DOB: [birth year]  ?MRN: [id number]  ? ?Primary Cardiologist: [name], MD ? ?Chart reviewed as part of pre-operative protocol coverage. Patient was contacted 04/26/2022 in reference to pre-operative risk assessment for pending surgery as outlined below.  Kayode Petion Kuntzman was last seen on 11/15/2021 by Dr. Johnsie Cancel.  Since that day, JACHAI OKAZAKI has done well without exertional chest pain or worsening dyspnea.  Patient was previously cleared for similar procedure in December 2022.  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 ? ?Therefore, based on ACC/AHA guidelines, the patient would be at acceptable risk for the planned procedure without further cardiovascular testing.  ? ?The patient was advised that if he develops new symptoms prior to surgery to contact our office to arrange for a follow-up visit, and he verbalized understanding. ? ?I will route this recommendation to the requesting party via Epic fax function and remove from pre-op pool. Please call with questions. ? ?Almyra Deforest, [place] ?04/26/2022, 6:04 PM ? ?

## 2022-04-26 NOTE — Telephone Encounter (Signed)
? ?  Pre-operative Risk Assessment  ?  ?Patient Name: Jonathan Lane  ?DOB: 1941/09/04 ?MRN: [id number]  ? ?  ? ?Request for Surgical Clearance   ? ?Procedure:   Urtestospy with biopsy  ? ?Date of Surgery:  Clearance 05/16/22                              ?   ?Surgeon:  [facility]  ?Surgeon's Group or Practice Name:  Dr. [name] ?Phone number:  [phone] ?Fax number:  [phone] ?  ?Type of Clearance Requested:   ?- Medical  ?- Pharmacy:  Hold Clopidogrel (Plavix) 72 hrs prior  ?  ?Type of Anesthesia:  General  ?  ?Additional requests/questions:     ? ?Signed, ?Milbert Coulter   ?04/26/2022, 1:51 PM  ? ?

## 2022-05-03 DIAGNOSIS — K219 Gastro-esophageal reflux disease without esophagitis: Secondary | ICD-10-CM | POA: Diagnosis not present

## 2022-05-03 DIAGNOSIS — I10 Essential (primary) hypertension: Secondary | ICD-10-CM | POA: Diagnosis not present

## 2022-05-03 DIAGNOSIS — N400 Enlarged prostate without lower urinary tract symptoms: Secondary | ICD-10-CM | POA: Diagnosis not present

## 2022-05-03 DIAGNOSIS — E782 Mixed hyperlipidemia: Secondary | ICD-10-CM | POA: Diagnosis not present

## 2022-05-05 DIAGNOSIS — H353222 Exudative age-related macular degeneration, left eye, with inactive choroidal neovascularization: Secondary | ICD-10-CM | POA: Diagnosis not present

## 2022-05-05 DIAGNOSIS — H35371 Puckering of macula, right eye: Secondary | ICD-10-CM | POA: Diagnosis not present

## 2022-05-05 DIAGNOSIS — H43393 Other vitreous opacities, bilateral: Secondary | ICD-10-CM | POA: Diagnosis not present

## 2022-05-05 DIAGNOSIS — H43813 Vitreous degeneration, bilateral: Secondary | ICD-10-CM | POA: Diagnosis not present

## 2022-05-05 DIAGNOSIS — H353211 Exudative age-related macular degeneration, right eye, with active choroidal neovascularization: Secondary | ICD-10-CM | POA: Diagnosis not present

## 2022-05-10 NOTE — Progress Notes (Addendum)
COVID Vaccine Completed: yes x5  Date of COVID positive in last 90 days: no  PCP - Garth Bigness, MD Cardiologist - Charlton Haws, MD  Cardiac clearance by Charlton Haws 04/26/22 in Epic   Chest x-ray - 12/18/21 Epic EKG - 12/20/21 Epic Stress Test - 03/26/18 Epic ECHO - 08/18/19 Epic  Cardiac Cath - n/a Pacemaker/ICD device last checked: n/a Spinal Cord Stimulator: n/a  Bowel Prep - no  Sleep Study - yes, positive CPAP - yes every night   Fasting Blood Sugar - n/a Checks Blood Sugar _____ times a day  Blood Thinner Instructions: Plavix, hold 7 days Aspirin Instructions: Last Dose: 05/07/22 0800  Activity level: Can go up a flight of stairs and perform activities of daily living without stopping and without symptoms of chest pain or shortness of breath.      Anesthesia review: HTN, a fib, TIA, OSA, K+ 2.8 at PAT  Patient denies shortness of breath, fever, cough and chest pain at PAT appointment   Patient verbalized understanding of instructions that were given to them at the PAT appointment. Patient was also instructed that they will need to review over the PAT instructions again at home before surgery.

## 2022-05-10 NOTE — Patient Instructions (Signed)
DUE TO COVID-19 ONLY TWO VISITORS  (aged 82 and older)  ARE ALLOWED TO COME WITH YOU AND STAY IN THE WAITING ROOM ONLY DURING PRE OP AND PROCEDURE.   **NO VISITORS ARE ALLOWED IN THE SHORT STAY AREA OR RECOVERY ROOM!!**   Your procedure is scheduled on: 05/16/22   Report to [facility] Main Entrance    Report to admitting at 6:45 AM   Call this number if you have problems the morning of surgery [phone]   Do not eat food :After Midnight.   After Midnight you may have the following liquids until 6:00 AM DAY OF SURGERY  Water Black Coffee (sugar ok, NO MILK/CREAM OR CREAMERS)  Tea (sugar ok, NO MILK/CREAM OR CREAMERS) regular and decaf                             Plain Jell-O (NO RED)                                           Fruit ices (not with fruit pulp, NO RED)                                     Popsicles (NO RED)                                                                  Juice: apple, WHITE grape, WHITE cranberry Sports drinks like Gatorade (NO RED) Clear broth(vegetable,chicken,beef)  FOLLOW BOWEL PREP AND ANY ADDITIONAL PRE OP INSTRUCTIONS YOU RECEIVED FROM YOUR SURGEON'S OFFICE!!!     Oral Hygiene is also important to reduce your risk of infection.                                    Remember - BRUSH YOUR TEETH THE MORNING OF SURGERY WITH YOUR REGULAR TOOTHPASTE   Take these medicines the morning of surgery with A SIP OF WATER: Tylenol, Nexium  These are anesthesia recommendations for holding your anticoagulants.  Please contact your prescribing physician to confirm IF it is safe to hold your anticoagulants for this length of time.   Eliquis Apixaban   72 hours   Xarelto Rivaroxaban   72 hours  Plavix Clopidogrel   120 hours  Pletal Cilostazol   120 hours     Bring CPAP mask and tubing day of surgery.                              You may not have any metal on your body including jewelry, and body piercing             Do not wear lotions, powders,  cologne, or deodorant              Men may shave face and neck.   Do not bring valuables to the hospital. [facility] IS NOT  RESPONSIBLE   FOR VALUABLES.    Patients discharged on the day of surgery will not be allowed to drive home.  Someone NEEDS to stay with you for the first 24 hours after anesthesia.              Please read over the following fact sheets you were given: IF YOU HAVE QUESTIONS ABOUT YOUR PRE-OP INSTRUCTIONS PLEASE CALL [facility] - Preparing for Surgery Before surgery, you can play an important role.  Because skin is not sterile, your skin needs to be as free of germs as possible.  You can reduce the number of germs on your skin by washing with CHG (chlorahexidine gluconate) soap before surgery.  CHG is an antiseptic cleaner which kills germs and bonds with the skin to continue killing germs even after washing. Please DO NOT use if you have an allergy to CHG or antibacterial soaps.  If your skin becomes reddened/irritated stop using the CHG and inform your nurse when you arrive at Short Stay. Do not shave (including legs and underarms) for at least 48 hours prior to the first CHG shower.  You may shave your face/neck.  Please follow these instructions carefully:  1.  Shower with CHG Soap the night before surgery and the  morning of surgery.  2.  If you choose to wash your hair, wash your hair first as usual with your normal  shampoo.  3.  After you shampoo, rinse your hair and body thoroughly to remove the shampoo.                             4.  Use CHG as you would any other liquid soap.  You can apply chg directly to the skin and wash.  Gently with a scrungie or clean washcloth.  5.  Apply the CHG Soap to your body ONLY FROM THE NECK DOWN.   Do   not use on face/ open                           Wound or open sores. Avoid contact with eyes, ears mouth and   genitals (private parts).                       Wash face,  Genitals (private  parts) with your normal soap.             6.  Wash thoroughly, paying special attention to the area where your    surgery  will be performed.  7.  Thoroughly rinse your body with warm water from the neck down.  8.  DO NOT shower/wash with your normal soap after using and rinsing off the CHG Soap.                9.  Pat yourself dry with a clean towel.            10.  Wear clean pajamas.            11.  Place clean sheets on your bed the night of your first shower and do not  sleep with pets. Day of Surgery : Do not apply any lotions/deodorants the morning of surgery.  Please wear clean clothes to the hospital/surgery center.  FAILURE TO FOLLOW THESE INSTRUCTIONS MAY RESULT IN THE CANCELLATION OF YOUR SURGERY  PATIENT SIGNATURE_________________________________  NURSE SIGNATURE__________________________________  ________________________________________________________________________

## 2022-05-11 ENCOUNTER — Telehealth: Payer: Self-pay | Admitting: Physician Assistant

## 2022-05-11 ENCOUNTER — Encounter (HOSPITAL_COMMUNITY): Payer: Self-pay

## 2022-05-11 ENCOUNTER — Encounter (HOSPITAL_COMMUNITY)
Admission: RE | Admit: 2022-05-11 | Discharge: 2022-05-11 | Disposition: A | Discharge status: Home / Self Care | Payer: PPO | Source: Ambulatory Visit | Attending: Urology | Admitting: Urology

## 2022-05-11 VITALS — BP 151/78 | HR 63 | Temp 98.3°F | Resp 16 | Ht 73.0 in | Wt 193.0 lb

## 2022-05-11 DIAGNOSIS — I358 Other nonrheumatic aortic valve disorders: Secondary | ICD-10-CM | POA: Diagnosis not present

## 2022-05-11 DIAGNOSIS — Z7902 Long term (current) use of antithrombotics/antiplatelets: Secondary | ICD-10-CM | POA: Insufficient documentation

## 2022-05-11 DIAGNOSIS — I10 Essential (primary) hypertension: Secondary | ICD-10-CM | POA: Diagnosis not present

## 2022-05-11 DIAGNOSIS — I700 Atherosclerosis of aorta: Secondary | ICD-10-CM | POA: Insufficient documentation

## 2022-05-11 DIAGNOSIS — Z8673 Personal history of transient ischemic attack (TIA), and cerebral infarction without residual deficits: Secondary | ICD-10-CM | POA: Insufficient documentation

## 2022-05-11 DIAGNOSIS — Z01812 Encounter for preprocedural laboratory examination: Secondary | ICD-10-CM | POA: Diagnosis not present

## 2022-05-11 DIAGNOSIS — I714 Abdominal aortic aneurysm, without rupture, unspecified: Secondary | ICD-10-CM | POA: Diagnosis not present

## 2022-05-11 DIAGNOSIS — Z87891 Personal history of nicotine dependence: Secondary | ICD-10-CM | POA: Insufficient documentation

## 2022-05-11 DIAGNOSIS — I4891 Unspecified atrial fibrillation: Secondary | ICD-10-CM | POA: Insufficient documentation

## 2022-05-11 DIAGNOSIS — C662 Malignant neoplasm of left ureter: Secondary | ICD-10-CM | POA: Insufficient documentation

## 2022-05-11 HISTORY — DX: Pneumonia, unspecified organism: J18.9

## 2022-05-11 LAB — CBC
HCT: 42 % (ref 39.0–52.0)
Hemoglobin: 14.5 g/dL (ref 13.0–17.0)
MCH: 32.7 pg (ref 26.0–34.0)
MCHC: 34.5 g/dL (ref 30.0–36.0)
MCV: 94.6 fL (ref 80.0–100.0)
Platelets: 184 10*3/uL (ref 150–400)
RBC: 4.44 MIL/uL (ref 4.22–5.81)
RDW: 13.6 % (ref 11.5–15.5)
WBC: 6.9 10*3/uL (ref 4.0–10.5)
nRBC: 0 % (ref 0.0–0.2)

## 2022-05-11 LAB — BASIC METABOLIC PANEL
Anion gap: 9 (ref 5–15)
BUN: 15 mg/dL (ref 8–23)
CO2: 27 mmol/L (ref 22–32)
Calcium: 9 mg/dL (ref 8.9–10.3)
Chloride: 103 mmol/L (ref 98–111)
Creatinine, Ser: 0.84 mg/dL (ref 0.61–1.24)
GFR, Estimated: >60 mL/min (ref >60)
Glucose, Bld: 115 mg/dL — ABNORMAL HIGH (ref 70–99)
Potassium: 2.8 mmol/L — ABNORMAL LOW (ref 3.5–5.1)
Sodium: 139 mmol/L (ref 135–145)

## 2022-05-11 NOTE — Progress Notes (Signed)
K+ 2.8, results routed to Dr. Jeffie Pollock

## 2022-05-12 NOTE — Progress Notes (Signed)
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 former Lane with h/o HTN, atrial fibrillation, AAA, bladder and left ureteral cancer scheduled for above procedure 05/16/2022 with Jonathan Lane.   Per cardiology preoperative evaluation 04/26/2022, "Chart reviewed as part of pre-operative protocol coverage. Patient was contacted 04/26/2022 in reference to pre-operative risk assessment for pending surgery as outlined below.  Jonathan Lane was last seen on 11/15/2021 by Jonathan Lane.  Since that day, Jonathan Lane has done well without exertional chest pain or worsening dyspnea.  Patient was previously cleared for similar procedure in December 2022.  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   Therefore, based on ACC/AHA guidelines, the patient would be at acceptable risk for the planned procedure without further cardiovascular testing. "  Pt advised to hold Plavix 7 days prior to procedure.   Anticipate pt can proceed with planned procedure barring acute status change.   VS: BP (!) 151/78   Pulse 63   Temp 36.8 C (Oral)   Resp 16   Ht '6\' 1"'$  (1.854 m)   Wt 87.5 kg   SpO2 96%   BMI 25.46 kg/m   PROVIDERS: Jonathan Smoker, MD is PCP   Primary Cardiologist: Jonathan Rouge, MD LABS: Labs reviewed: Acceptable for surgery. (all labs ordered are listed, but only abnormal results are displayed)  Labs Reviewed  BASIC METABOLIC PANEL - Abnormal; Notable for the following components:      Result Value   Potassium 2.8 (*)    Glucose, Bld 115 (*)     All other components within normal limits  CBC     IMAGES:   EKG: 12/20/2021 Rate 59 bpm  Sinus rhythm   CV: Echo 08/18/2019 1. The left ventricle has hyperdynamic systolic function, with an  ejection fraction of >65%. The cavity size was normal. There is moderately  increased left ventricular wall thickness. Left ventricular diastolic  Doppler parameters are consistent with  impaired relaxation.   2. The right ventricle has normal systolic function. The cavity was  normal. There is no increase in right ventricular wall thickness.   3. Mild calcification of the mitral valve leaflet. There is mild mitral  annular calcification present. No evidence of mitral valve stenosis. No  significant mitral regurgitation.   4. The aortic valve is tricuspid. Mild calcification of the aortic valve.  No stenosis of the aortic valve.   5. The inferior vena cava was dilated in size with <50% respiratory  variability. No complete TR doppler jet so unable to estimate PA systolic  pressure.   6. The aortic root is normal in size and structure.   7. Trivial pericardial effusion is present.   Myocardial Perfusion 03/26/2018 Nuclear stress EF: 71%. The study is normal. This is a low risk study. The left ventricular ejection fraction is hyperdynamic (>65%). There was no ST segment deviation noted during stress. BP did drop in Stage 3 but likely erroneous as he had a hypertensive BP response in  recovery.   Past Medical History:  Diagnosis Date   AAA (abdominal aortic aneurysm) (HCC)    Atrial fibrillation (HCC)    Bradycardia    Bradycardia    Cancer (HCC)    Cardiac murmur    Dizziness    Dysrhythmia    GERD (gastroesophageal reflux disease)    Glaucoma    Heart palpitations    Hyperlipidemia    Hypertension    Numbness 08/26/2019   LEFT FACE   Pneumonia    Skin abnormalities    pre cancerous lesion R hand   Sleep apnea    uses cpap   Snoring     Past Surgical History:  Procedure  Laterality Date   APPENDECTOMY  12/19/1963   COLONOSCOPY     POLYPECTOMY     SKIN CANCER EXCISION     TONSILLECTOMY     as a child x2   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   Procedure: RESTAGING TRANSURETHRAL RESECTION OF BLADDER TUMOR  (TURBT) LEFT URETEROSCOPY WITH LASER LEFT STENT EXCHANGE RIGHT STENT REMOVAL;  Surgeon: Jonathan Seal, MD;  Location: [facility];  Service: Urology;  Laterality: Bilateral;   TRANSURETHRAL RESECTION OF BLADDER TUMOR WITH MITOMYCIN-C Bilateral 01/03/2022   Procedure: CYSTOSCOPY TRANSURETHRAL RESECTION OF BLADDER TUMOR WITH POSSIBLE  BILATERAL RETROGRADES STENT PLACEMENT;  Surgeon: Jonathan Seal, MD;  Location: [facility];  Service: Urology;  Laterality: Bilateral;   WISDOM TOOTH EXTRACTION      MEDICATIONS:  acetaminophen (TYLENOL) 500 MG tablet   amoxicillin (AMOXIL) 500 MG capsule   Apoaequorin (PREVAGEN EXTRA STRENGTH) 20 MG CAPS   atorvastatin (LIPITOR) 20 MG tablet   cholecalciferol (VITAMIN D3) 25 MCG (1000 UNIT) tablet   clopidogrel (PLAVIX) 75 MG tablet   Coenzyme Q10 (COQ10) 200 MG CAPS   COSOPT PF 2-0.5 % SOLN ophthalmic solution   CRANBERRY PO   Cyanocobalamin (B-12 PO)   DILT-XR 240 MG 24 hr capsule   esomeprazole (NEXIUM) 20 MG capsule   fluticasone (FLONASE) 50 MCG/ACT nasal spray   Glucosamine-Chondroitin (MOVE FREE PO)   losartan-hydrochlorothiazide (HYZAAR) 100-25 MG tablet   Melatonin 3 MG CAPS   Misc Natural Products (PROSTATE HEALTH) CAPS   Multiple Vitamins-Minerals (ADULT GUMMY) CHEW   Multiple Vitamins-Minerals (LUTEIN-ZEAXANTHIN) TABS   Multiple Vitamins-Minerals (PRESERVISION AREDS 2 PO)   Multiple Vitamins-Minerals (ZINC PO)   nitroGLYCERIN (NITROSTAT) 0.4 MG SL tablet   Omega-3 Fatty Acids (FISH OIL) 1200 MG CAPS   OVER THE COUNTER MEDICATION   OVER THE COUNTER MEDICATION   Probiotic Product (ALIGN PO)   RHOPRESSA 0.02 % SOLN   tadalafil (CIALIS) 5 MG tablet   UNABLE TO FIND   valACYclovir (VALTREX)  1000 MG tablet   Vibegron (GEMTESA) 75 MG TABS   No current facility-administered medications for this encounter.    Jonathan Felix Ward, PA-C [facility] [phone]

## 2022-05-12 NOTE — H&P (Deleted)
I have bladder cancer that has been treated.  HPI: Jonathan Lane is a 81 year-old male established patient who is here for follow-up of bladder cancer treatment.    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 There was muscle in the specimen and no muscle invasion was noted. He has had no hematuria in the last 2 days but he has had some burning with urination. He have good continence.      ALLERGIES: Sulfa Drugs    MEDICATIONS: Aspirin 81 mg tablet,chewable  Lipitor 40 mg tablet  Amlodipine Besilate  Vitamin D3     GU PSH: Cystoscopy - 04/07/2022 Cystoscopy TURBT >5 cm - 04/18/2022 Prostate Needle Biopsy - 2017 Robotic Radical Prostatectomy - 2018       PSH Notes: Knee Surgery, Cataract Surgery   NON-GU PSH: Surgical Pathology, Gross And Microscopic Examination For Prostate Needle - 2017     GU PMH: Bladder tumor/neoplasm, HE has a large papillary neoplasm on the left bladder neck and lateral wall and the left UO is not well seen. There is an additional lesion on the floor of the bladder that is concerning for CIS. He needs a TURBT and may need left ureteral resection and stenting. I will do retrogrades and may do Gemcitabine if the tumor appears well resected and away from the orifice. I have reviewed the risks of bleeding, infection, bladder wall injury, possible need for secondary procedure, possible need for ureteral resection with the need for a stent. Possible ureteral and urethral strictures, chemical cystitis, secondary procedures, thrombotic events and anesthetic complications. HE will likely need BCG if the cancer is non-muscle invasive. - 04/07/2022 Gross hematuria, He had his initial bout of hematuria in November and a more recent severe recurrence. The bleeding is secondary to the bladder neoplasm. 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 - 2017 Elevated PSA - 2017, - 2017, Elevated prostate specific antigen (PSA), - 2015 Encounter for Prostate Cancer screening - 2017      PMH Notes:   1) Prostate cancer: He is s/p a BNS RAL radical prostatectomy on 12/25/16.   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  Does not use smokeless tobacco. Social Drinker.  Does not use drugs. Drinks 4+ caffeinated drinks per day.     Notes: Never a smoker, Occupation, Number of children, Alcohol Use, Marital History - Currently Married, Caffeine Use, Tobacco Use   REVIEW OF SYSTEMS:    GU Review Male:   Patient reports burning/ pain with urination and get up at night to urinate. Patient denies frequent urination, hard to postpone urination, leakage of urine, stream starts and stops, trouble starting your stream, have to strain to urinate , erection problems, and penile pain.  Gastrointestinal (Upper):   Patient denies nausea, vomiting, and indigestion/ heartburn.  Gastrointestinal (  Lower):   Patient denies diarrhea and constipation.  Constitutional:   Patient denies fever, night sweats, weight loss, and fatigue.  Skin:   Patient denies skin rash/ lesion and itching.  Eyes:   Patient denies blurred vision and double vision.  Ears/ Nose/ Throat:   Patient denies sore throat and sinus problems.  Hematologic/Lymphatic:   Patient  denies swollen glands and easy bruising.  Cardiovascular:   Patient denies leg swelling and chest pains.  Respiratory:   Patient denies cough and shortness of breath.  Endocrine:   Patient denies excessive thirst.  Musculoskeletal:   Patient denies back pain and joint pain.  Neurological:   Patient denies headaches and dizziness.  Psychologic:   Patient denies depression and anxiety.   VITAL SIGNS:      04/28/2022 01:49 PM  Weight 270 lb / 122.47 kg  Height 73 in / 185.42 cm  BP 133/84 mmHg  Heart Rate 73 /min  Temperature 98.2 F / 36.7 C  BMI 35.6 kg/m   MULTI-SYSTEM PHYSICAL EXAMINATION:    Constitutional: Well-nourished. No physical deformities. Normally developed. Good grooming.  Respiratory: Normal breath sounds. No labored breathing, no use of accessory muscles.   Cardiovascular: Regular rate and rhythm. No murmur, no gallop.      Complexity of Data:  Records Review:   Pathology Reports, Previous Patient Records  Urine Test Review:   Urinalysis   11/07/17 04/04/17 06/30/16 05/11/16 08/13/14  PSA  Total PSA <0.015 ng/mL < 0.015 ng/dl 6.67  6.001 ng/dl 4.20   Free PSA   0.81   0.61   % Free PSA   [id number]   15     08/12/14  Hormones  Testosterone, Total 267     PROCEDURES:          Urinalysis w/Scope Dipstick Dipstick Cont'd Micro  Color: Yellow Bilirubin: Neg mg/dL WBC/hpf: 0 - 5/hpf  Appearance: Clear Ketones: Neg mg/dL RBC/hpf: 10 - 20/hpf  Specific Gravity: 1.020 Blood: 3+ ery/uL Bacteria: Rare (0-9/hpf)  pH: <=5.0 Protein: 1+ mg/dL Cystals: NS (Not Seen)  Glucose: Neg mg/dL Urobilinogen: 0.2 mg/dL Casts: NS (Not Seen)    Nitrites: Neg Trichomonas: Not Present    Leukocyte Esterase: Neg leu/uL Mucous: Present      Epithelial Cells: NS (Not Seen)      Yeast: NS (Not Seen)      Sperm: Not Present    ASSESSMENT:      ICD-10 Details  1 GU:   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 I reviewed the risks of bleeding, infection, bladder and ureteral injury, need for additional treatment including intravesical BCG or chemotherapy, thrombotic events and anesthetic complications.   I reviewed BCG therapy including the procedure and the side effects and possible need for additional therapies. I would start this in mid June after his post op f/u from the restaging resection.    PLAN:           Schedule Return Visit/Planned Activity: Keep Scheduled Appointment  Procedure: 05/16/2022 - Cystoscopy TURBT >5 cm - 66063          Document Letter(s):  Created for Patient: Clinical Summary

## 2022-05-12 NOTE — H&P (Signed)
I have bladder cancer.  HPI: Jonathan Lane is a 81 year-old male established patient who is here for bladder cancer.    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 His last resection was for restaging on 01/31/22 and ureteroscopy demonstrated a few fronds in the very distal ureter. A stent was left. He had his last BCG induction instillation on 04/11/22. He will need cystoscopy and ureteroscopy with stent removal/exchange later this month. UA today has RBC's. He is on amoxicillin for the next week or so for a tooth issue. He has stopped [place] Her remains on plavix. His IPSS today is 2 with nocturia x 2.   02/10/22: Jonathan Lane returns today in f/u from his restaging TURBT and ureteroscopy with ablation and stent change. He feels good and his voiding discomfort is much reduced. His path showed some residual HG NMIBC that is probably from the left ureteral orifice where there was visable residual tumor. His UA has typical post op changes today. After his last treatment he has been having some dysuria. He has been using tylenol with relief. He has no hematuria or difficulty voiding. He is having no flank pain.   01/26/2022: The patient is scheduled for restaging TURBT and left ureteroscopy on 2/14. Earlier this week he paged while I was on-call endorsing signs/symptoms of urinary retention so I recommended presenting to the emergency department for evaluation. He was seen there and had a Foley catheter placed. Preceding his catheter placement, he was having hematuria with passage of clot and tissue material. This has somewhat continued so Dr. Jeffie Pollock recommended he follow-up this afternoon with me for repeat evaluation.   Hematuria has lessened in severity through today's appointment. He has not passed any clot or tissue material since around the time his appointment was made. He is not having any painful or leaking urgency. He is not having any lower  back or flank pain/discomfort suggestive of obstructive uropathy. He denies interval fevers or chills, nausea/vomiting.   01/20/2022: Jonathan Lane returns today in f/u from his recent TURBT. He had extensive papillary disease of the trigone bladder neck and posterior bladder wall with tumor involving the left distal ureter and close the right. He had bilateral stents placed. His path showed T1 HG NMIBC. He is scheduled to return to the OR on 01/31/22 for restaging TURBT and left ureteroscopy with laser ablation of tumor. He is voiding with some pain. He is using tylenol up to '3000mg'$  daily which has been helpful. He has urgency and frequency q1-2hrs and nocturia q2hr. He has flank pain with voiding. He passed a clot 2 weeks post op but he has done well since.     CC: AUA Questions Scoring.  HPI:     AUA Symptom Score: He never has the sensation of not emptying his bladder completely after finishing urinating. He never has to urinate again less that two hours after he has finished urinating. He does not have to stop and start again several times when he urinates. He never finds it difficult to postpone urination. He never has a weak urinary stream. He never has to push or strain to begin urination. He has to get up to urinate 3 times from the time he goes to bed until the time he gets up in the morning.   Calculated AUA Symptom Score: 3    ALLERGIES: Combigan SOLN Latanoprostene Bunod - damage to right eye    MEDICATIONS:  Lipitor 10 mg tablet  Plavix  Amoxicillin  Centrum TABS Oral  Coenzyme Q10 50 mg capsule Oral  Cosopt Pf 2 %-0.5 % dropperette, single-use drop dispenser Ophthalmic  Cranberry 450 mg tablet Oral  DilTIAZem HCl ER Coated Beads 240 MG Oral Capsule Extended Release 24 Hour Oral  Fish Oil Double Strength 1200 MG Oral Capsule Oral  Glucosamine Chondroitin TABS Oral  Losartan Potassium-HCTZ 100-12.5 MG Oral Tablet Oral  Prostate Formula TABS Oral  Rhopressa 0.02 % drops   Tadalafil 5 mg tablet 1 tablet PO Daily  Tadalafil 20 mg tablet 1/2 tablet PO Daily PRN  Valtrex 1,000 mg tablet Oral     GU PSH: Bladder Instill AntiCA Agent - 04/11/2022, 04/04/2022, 03/28/2022, 03/14/2022, 03/07/2022, 02/28/2022 Cystoscopy - 12/06/2021 Cystoscopy Insert Stent - 01/31/2022 Cystoscopy Retrogrades - 01/03/2022 Cystoscopy TURBT >5 cm - 01/31/2022, 01/03/2022       PSH Notes: Appendectomy, Tonsillectomy   NON-GU PSH: Appendectomy - 2008 Cataract surgery, Bilateral Remove Tonsils - 2008     GU PMH: Bladder Cancer overlapping sites - 04/11/2022, - 04/04/2022, - 03/28/2022, - 03/21/2022, - 03/14/2022, - 03/07/2022, - 02/28/2022, He had some residual HG NMIBC on his restaging TURBT but no muscle invasion. I dsicussed options including BCG, Intravesical chemo and cystectomy. I reviewed the risks and side effects of BCG in detail as well as the nature of the instillations and need for maintenance. He will also need a return to the OR in about 3 months for bladder biopsy and ureteroscopy with stent removal/exchange. He will be set up for the instillations starting in 1-2 weeks and then f/u in about 10 week as a preop visit. , - 02/10/2022, - 01/26/2022, 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 I will take him back on 2/14 for a restaging TURBT and left ureteroscopy with laser. I discussed subsequent options for therapy including cystectomy with diversion. Management of the left ureteral tumor with that could be a distal ureterectomy or NU depending on the findings at the next procedure. If I find limited ureteral disease and no muscle invasion on restaging and I am able to ablate the distal tumor, I think it would be worthwhile to give him induction BCG with a left ureteral stent in place and then reassess in the OR 6-8 weeks s/p BCG. I have reviewed the risks of the restaging procedure and the risks of BCG. I also discussed alternatives to BCG including intravesical chemo and  Keytruda. 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 It should be impacted by the BCG treatment with the stent in place. - 02/10/2022, - 01/26/2022, - 01/20/2022 Gross hematuria - 01/26/2022 Urinary Retention - 01/26/2022 Urinary Urgency, I am going to give him Gemtesa to see if that will reduce his frequency so he can cut back on the tylenol. Side effects and precautions reviewed. Urine culture today. - 01/20/2022 Bladder Cancer Trigone , He has a 2x3 cm papillary lesion at the bladder base extending from midline to the area of the left UO. The left UO is not apparent. This the probably cause of his voiding symptoms and microhematuria. I will get him set up for cystoscopy with bilateral RTG's, TURBT with possible gemcitabine and possible stent. I have reviewed the risks of bleeding, infection, bladder, urethral and ureteral injury, need for secondary procedures, chemical cystitis, thrombotic events and anesthetic complications. - 12/06/2021 Microscopic hematuria, The hematuria has cleared. - 12/06/2021, - 11/28/2021 Renal calculus, He  has a 57m non-obstructing renal stone. - 12/06/2021, He has a LLP stone with a prior history of stones but it is non-obstructing and doesn't require treatment at this time. , - 11/28/2021, Nephrolithiasis, - 203/30/2016ED due to arterial insufficiency, He is doing well with the tadalafil and uses about '15mg'$  for best results. Med refilled. - 11/28/2021, He continues to use the tadalafil '5mg'$  daily with prn sildenafil and is doing well with voiding and a reasonable response to the tadalafil '5mg'$  daily with occasional additional '10mg'$  as needed. He will return for FirmEST as needed since that gives some benefit. , - 04/20/2021, - 11/03/2020, - 10/13/2020, -03/16/2020 -2017/03/16 Erectile dysfunction due to arterial insufficiency, - 203-30-16Other urethritis, He has some discomfort in the terminal urethra that could be related to lubricant he is using. I offered  suggestions on how to avoid that possibility, but he also has microhematuria with some pyuria of uncertain etiology. He has a LLP stone but I will have him return for cystoscopy to complete the evaluation. - 11/28/2021 Unil Inguinal Hernia W/O obst or gang,non-recurrent, The hernia is small and an incidental finding. - 11/28/2021 BPH w/LUTS - 04/20/2021, (Stable), - 230-Mar-2018 Benign prostatic hyperplasia with urinary obstruction, - 230-Mar-2016History of urolithiasis, No hematuria or flank pain. - 04/20/2021, He has had no stones symptoms., -2020-03-30Nocturia - 04/20/2021, -2017-03-16 Nocturia, - 203/30/2016Elevated PSA - 203-30-18 Elevated prostate specific antigen (PSA), - 2March 30, 2016   NON-GU PMH: Encounter for general adult medical examination without abnormal findings, Encounter for preventive health examination - 22016-03-30Personal history of other diseases of the circulatory system, History of hypertension - 203-30-14Personal history of other diseases of the nervous system and sense organs, History of glaucoma - 230-Mar-2014   FAMILY HISTORY: Death In The Family Father - Runs In Family Death In The Family Mother - Runs In Family Family Health Status Number - Runs In Family Hypertension - Father Obesity - Father   SOCIAL HISTORY: Marital Status: Married     Notes: Previous History Of Smoking, Alcohol Use, Tobacco Use, Occupation:, Caffeine Use   REVIEW OF SYSTEMS:    GU Review Male:   Patient reports burning/ pain with urination. Patient denies frequent urination, hard to postpone urination, get up at night to urinate, leakage of urine, stream starts and stops, trouble starting your stream, have to strain to urinate , erection problems, and penile pain.  Gastrointestinal (Upper):   Patient denies nausea, vomiting, and indigestion/ heartburn.  Gastrointestinal (Lower):   Patient denies diarrhea and constipation.  Constitutional:   Patient denies fever, night sweats, weight loss, and fatigue.  Skin:   Patient denies skin rash/ lesion and  itching.  Eyes:   Patient denies blurred vision and double vision.  Ears/ Nose/ Throat:   Patient denies sore throat and sinus problems.  Hematologic/Lymphatic:   Patient denies swollen glands and easy bruising.  Cardiovascular:   Patient denies leg swelling and chest pains.  Respiratory:   Patient denies cough and shortness of breath.  Endocrine:   Patient denies excessive thirst.  Musculoskeletal:   Patient denies back pain and joint pain.  Neurological:   Patient denies dizziness and headaches.  Psychologic:   Patient denies depression and anxiety.   VITAL SIGNS: None   MULTI-SYSTEM PHYSICAL EXAMINATION:    Constitutional: Well-nourished. No physical deformities. Normally developed. Good grooming.   Respiratory: Normal breath sounds. No labored breathing, no use of accessory muscles.   Cardiovascular: Regular rate and  rhythm. No murmur, no gallop.      Complexity of Data:  Records Review:   AUA Symptom Score, Previous Patient Records  Urine Test Review:   Urinalysis   03/28/18 03/19/17 01/14/15 10/22/13 02/04/13 02/05/12 07/19/10 12/02/09  PSA  Total PSA 1.06 ng/mL 1.49 ng/dl 1.49  1.30  2.61  1.17  1.72  2.51     PROCEDURES:          Urinalysis w/Scope Dipstick Dipstick Cont'd Micro  Color: Yellow Bilirubin: Neg mg/dL WBC/hpf: 0 - 5/hpf  Appearance: Clear Ketones: Neg mg/dL RBC/hpf: 20 - 40/hpf  Specific Gravity: 1.015 Blood: 3+ ery/uL Bacteria: NS (Not Seen)  pH: 6.0 Protein: Neg mg/dL Cystals: NS (Not Seen)  Glucose: Neg mg/dL Urobilinogen: 0.2 mg/dL Casts: NS (Not Seen)    Nitrites: Neg Trichomonas: Not Present    Leukocyte Esterase: 2+ leu/uL Mucous: Not Present      Epithelial Cells: NS (Not Seen)      Yeast: NS (Not Seen)      Sperm: Not Present    ASSESSMENT:      ICD-10 Details  1 GU:   Bladder Cancer overlapping sites - C67.8 Chronic, Threat to Bodily Function - He is doing well s/p BCG and needs cystoscopy and ureteroscopy in a few weeks. I have reviewed the  risks and will get him scheduled in the next 4-6 weeks for the procedure.   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            Schedule Return Visit/Planned Activity: 3 Weeks - Schedule Surgery  Procedure: Approximately 3 Weeks - Cystoscopy Ureteroscopy - 42353          Document Letter(s):  Created for Patient: Clinical Summary         Notes:   CC: Dr. Elijio Miles.         Next Appointment:      Next Appointment: 05/16/2022 09:00 AM    Appointment Type: Surgery     Location: [facility] [phone]    Provider: [name], M.D.    Reason for Visit: WL/OP CYSTO, RT RTG, LT STENT REMOVAL, LT URS, POSS BLADDER OR URETERAL BX

## 2022-05-15 NOTE — Anesthesia Preprocedure Evaluation (Signed)
Anesthesia Evaluation  Patient identified by MRN, date of birth, ID band Patient awake    Reviewed: Allergy & Precautions, NPO status , Patient's Chart, lab work & pertinent test results  Airway Mallampati: II  TM Distance: >3 FB Neck ROM: Full    Dental no notable dental hx. (+) Teeth Intact, Dental Advisory Given   Pulmonary sleep apnea and Continuous Positive Airway Pressure Ventilation , former smoker,    Pulmonary exam normal breath sounds clear to auscultation       Cardiovascular hypertension, Normal cardiovascular exam+ dysrhythmias Atrial Fibrillation  Rhythm:Regular Rate:Normal  07/2020 TTE 1. The left ventricle has hyperdynamic systolic function, with an  ejection fraction of >65%. The cavity size was normal. There is moderately  increased left ventricular wall thickness. Left ventricular diastolic  Doppler parameters are consistent with  impaired relaxation.  2. The right ventricle has normal systolic function. The cavity was  normal. There is no increase in right ventricular wall thickness.  3. Mild calcification of the mitral valve leaflet. There is mild mitral  annular calcification present. No evidence of mitral valve stenosis. No  significant mitral regurgitation.  4. The aortic valve is tricuspid. Mild calcification of the aortic valve.  No stenosis of the aortic valve.  5. The inferior vena cava was dilated in size with <50% respiratory  variability. No complete TR doppler jet so unable to estimate PA systolic  pressure.  6. The aortic root is normal in size and structure.  7. Trivial pericardial effusion is present.   Neuro/Psych    GI/Hepatic Neg liver ROS, GERD  ,  Endo/Other  negative endocrine ROS  Renal/GU Lab Results      Component                Value               Date                      CREATININE               0.84                05/11/2022                K                        2.8  (L)             05/11/2022                  Bladder CA    Musculoskeletal negative musculoskeletal ROS (+)   Abdominal   Peds  Hematology Lab Results      Component                Value               Date                      HGB                      14.5                05/11/2022                HCT  42.0                05/11/2022               PLT                      184                 05/11/2022              Anesthesia Other Findings ALL: Cipro, Tetracycline   Reproductive/Obstetrics                            Anesthesia Physical Anesthesia Plan  ASA: 3  Anesthesia Plan: General   Post-op Pain Management: Minimal or no pain anticipated   Induction: Intravenous  PONV Risk Score and Plan: 3 and Treatment may vary due to age or medical condition and Ondansetron  Airway Management Planned: Oral ETT  Additional Equipment: None  Intra-op Plan:   Post-operative Plan: Extubation in OR  Informed Consent:     Dental advisory given  Plan Discussed with:   Anesthesia Plan Comments:         Anesthesia Quick Evaluation

## 2022-05-16 ENCOUNTER — Encounter (HOSPITAL_COMMUNITY): Payer: Self-pay | Admitting: Urology

## 2022-05-16 ENCOUNTER — Encounter (HOSPITAL_COMMUNITY)
Admission: RE | Disposition: A | Discharge status: Home / Self Care | Payer: Self-pay | Source: Ambulatory Visit | Attending: Urology

## 2022-05-16 ENCOUNTER — Ambulatory Visit (HOSPITAL_BASED_OUTPATIENT_CLINIC_OR_DEPARTMENT_OTHER): Payer: PPO | Admitting: Anesthesiology

## 2022-05-16 ENCOUNTER — Ambulatory Visit (HOSPITAL_COMMUNITY): Payer: PPO

## 2022-05-16 ENCOUNTER — Ambulatory Visit (HOSPITAL_COMMUNITY)
Admission: RE | Admit: 2022-05-16 | Discharge: 2022-05-16 | Disposition: A | Discharge status: Home / Self Care | Payer: PPO | Source: Ambulatory Visit | Attending: Urology | Admitting: Urology

## 2022-05-16 ENCOUNTER — Ambulatory Visit (HOSPITAL_COMMUNITY): Payer: PPO | Admitting: Physician Assistant

## 2022-05-16 DIAGNOSIS — Z8551 Personal history of malignant neoplasm of bladder: Secondary | ICD-10-CM | POA: Diagnosis not present

## 2022-05-16 DIAGNOSIS — C679 Malignant neoplasm of bladder, unspecified: Secondary | ICD-10-CM | POA: Insufficient documentation

## 2022-05-16 DIAGNOSIS — C662 Malignant neoplasm of left ureter: Secondary | ICD-10-CM | POA: Insufficient documentation

## 2022-05-16 DIAGNOSIS — N200 Calculus of kidney: Secondary | ICD-10-CM | POA: Diagnosis not present

## 2022-05-16 DIAGNOSIS — G4730 Sleep apnea, unspecified: Secondary | ICD-10-CM | POA: Insufficient documentation

## 2022-05-16 DIAGNOSIS — Z7902 Long term (current) use of antithrombotics/antiplatelets: Secondary | ICD-10-CM | POA: Insufficient documentation

## 2022-05-16 DIAGNOSIS — R351 Nocturia: Secondary | ICD-10-CM | POA: Insufficient documentation

## 2022-05-16 DIAGNOSIS — D0919 Carcinoma in situ of other urinary organs: Secondary | ICD-10-CM | POA: Diagnosis not present

## 2022-05-16 DIAGNOSIS — Z87891 Personal history of nicotine dependence: Secondary | ICD-10-CM | POA: Insufficient documentation

## 2022-05-16 DIAGNOSIS — I10 Essential (primary) hypertension: Secondary | ICD-10-CM | POA: Insufficient documentation

## 2022-05-16 DIAGNOSIS — I4891 Unspecified atrial fibrillation: Secondary | ICD-10-CM

## 2022-05-16 DIAGNOSIS — C676 Malignant neoplasm of ureteric orifice: Secondary | ICD-10-CM | POA: Diagnosis not present

## 2022-05-16 DIAGNOSIS — D4959 Neoplasm of unspecified behavior of other genitourinary organ: Secondary | ICD-10-CM | POA: Diagnosis not present

## 2022-05-16 HISTORY — PX: CYSTOSCOPY WITH RETROGRADE PYELOGRAM, URETEROSCOPY AND STENT PLACEMENT: SHX5789

## 2022-05-16 HISTORY — PX: CYSTOSCOPY WITH BIOPSY: SHX5122

## 2022-05-16 SURGERY — CYSTOURETEROSCOPY, WITH RETROGRADE PYELOGRAM AND STENT INSERTION
Anesthesia: General

## 2022-05-16 MED ORDER — PROPOFOL 10 MG/ML IV BOLUS
INTRAVENOUS | Status: DC | PRN
  Administered 2022-05-16: 150 mg via INTRAVENOUS

## 2022-05-16 MED ORDER — FENTANYL CITRATE PF 50 MCG/ML IJ SOSY: 25.0000 ug | PREFILLED_SYRINGE | INTRAMUSCULAR | Status: DC | PRN

## 2022-05-16 MED ORDER — DEXAMETHASONE SODIUM PHOSPHATE 4 MG/ML IJ SOLN
INTRAMUSCULAR | Status: DC | PRN
  Administered 2022-05-16: 4 mg via INTRAVENOUS

## 2022-05-16 MED ORDER — DEXMEDETOMIDINE (PRECEDEX) IN NS 20 MCG/5ML (4 MCG/ML) IV SYRINGE
PREFILLED_SYRINGE | INTRAVENOUS | Status: DC | PRN
  Administered 2022-05-16: 8 ug via INTRAVENOUS

## 2022-05-16 MED ORDER — CHLORHEXIDINE GLUCONATE 0.12 % MT SOLN
15.0000 mL | Freq: Once | OROMUCOSAL | Status: AC
  Administered 2022-05-16: 15 mL via OROMUCOSAL

## 2022-05-16 MED ORDER — EPHEDRINE SULFATE (PRESSORS) 50 MG/ML IJ SOLN
INTRAMUSCULAR | Status: DC | PRN
  Administered 2022-05-16: 10 mg via INTRAVENOUS
  Administered 2022-05-16: 5 mg via INTRAVENOUS
  Administered 2022-05-16: 10 mg via INTRAVENOUS

## 2022-05-16 MED ORDER — ONDANSETRON HCL 4 MG/2ML IJ SOLN
INTRAMUSCULAR | Status: DC | PRN
  Administered 2022-05-16: 4 mg via INTRAVENOUS

## 2022-05-16 MED ORDER — LIDOCAINE HCL (CARDIAC) PF 100 MG/5ML IV SOSY
PREFILLED_SYRINGE | INTRAVENOUS | Status: DC | PRN
  Administered 2022-05-16: 100 mg via INTRAVENOUS

## 2022-05-16 MED ORDER — LACTATED RINGERS IV SOLN: INTRAVENOUS | Status: DC

## 2022-05-16 MED ORDER — GLYCOPYRROLATE 0.2 MG/ML IJ SOLN
INTRAMUSCULAR | Status: DC | PRN
  Administered 2022-05-16: .2 mg via INTRAVENOUS

## 2022-05-16 MED ORDER — PROPOFOL 10 MG/ML IV BOLUS
INTRAVENOUS | Status: AC
  Filled 2022-05-16: qty 20

## 2022-05-16 MED ORDER — FENTANYL CITRATE (PF) 100 MCG/2ML IJ SOLN
INTRAMUSCULAR | Status: AC
  Filled 2022-05-16: qty 2

## 2022-05-16 MED ORDER — ACETAMINOPHEN 10 MG/ML IV SOLN: 1000.0000 mg | Freq: Once | INTRAVENOUS | Status: DC | PRN

## 2022-05-16 MED ORDER — SODIUM CHLORIDE 0.9 % IV SOLN
2.0000 g | INTRAVENOUS | Status: AC
  Administered 2022-05-16: 2 g via INTRAVENOUS
  Filled 2022-05-16: qty 20

## 2022-05-16 MED ORDER — ORAL CARE MOUTH RINSE: 15.0000 mL | Freq: Once | OROMUCOSAL | Status: AC

## 2022-05-16 MED ORDER — DEXMEDETOMIDINE (PRECEDEX) IN NS 20 MCG/5ML (4 MCG/ML) IV SYRINGE
PREFILLED_SYRINGE | INTRAVENOUS | Status: AC
  Filled 2022-05-16: qty 10

## 2022-05-16 MED ORDER — SODIUM CHLORIDE 0.9 % IR SOLN
Status: DC | PRN
  Administered 2022-05-16: 6000 mL

## 2022-05-16 MED ORDER — EPHEDRINE 5 MG/ML INJ
INTRAVENOUS | Status: AC
  Filled 2022-05-16: qty 5

## 2022-05-16 MED ORDER — IOHEXOL 300 MG/ML  SOLN
INTRAMUSCULAR | Status: DC | PRN
  Administered 2022-05-16: 20 mL

## 2022-05-16 MED ORDER — FENTANYL CITRATE (PF) 100 MCG/2ML IJ SOLN
INTRAMUSCULAR | Status: DC | PRN
  Administered 2022-05-16: 25 ug via INTRAVENOUS
  Administered 2022-05-16: 50 ug via INTRAVENOUS
  Administered 2022-05-16 (×5): 25 ug via INTRAVENOUS

## 2022-05-16 MED ORDER — SODIUM CHLORIDE 0.9% FLUSH: 3.0000 mL | Freq: Two times a day (BID) | INTRAVENOUS | Status: DC

## 2022-05-16 MED ORDER — ONDANSETRON HCL 4 MG/2ML IJ SOLN: 4.0000 mg | Freq: Once | INTRAMUSCULAR | Status: DC | PRN

## 2022-05-16 SURGICAL SUPPLY — 38 items
BAG COUNTER SPONGE SURGICOUNT (BAG) ×2 IMPLANT
BAG URINE DRAIN 2000ML AR STRL (UROLOGICAL SUPPLIES) ×1 IMPLANT
BAG URO CATCHER STRL LF (MISCELLANEOUS) ×3 IMPLANT
BASKET STONE NCOMPASS (UROLOGICAL SUPPLIES) IMPLANT
CATH FOLEY 2WAY SLVR  5CC 20FR (CATHETERS) ×3
CATH FOLEY 2WAY SLVR 5CC 20FR (CATHETERS) IMPLANT
CATH FOLEY 3WAY 30CC 22FR (CATHETERS) IMPLANT
CATH URETERAL DUAL LUMEN 10F (MISCELLANEOUS) IMPLANT
CATH URETL OPEN 5X70 (CATHETERS) IMPLANT
CLOTH BEACON ORANGE TIMEOUT ST (SAFETY) ×3 IMPLANT
DRAPE FOOT SWITCH (DRAPES) ×3 IMPLANT
ELECT REM PT RETURN 15FT ADLT (MISCELLANEOUS) ×3 IMPLANT
EXTRACTOR STONE NITINOL NGAGE (UROLOGICAL SUPPLIES) ×1 IMPLANT
FIBER LASER TRACTIP 200 (UROLOGICAL SUPPLIES) ×1 IMPLANT
GLOVE SURG SS PI 8.0 STRL IVOR (GLOVE) ×3 IMPLANT
GOWN STRL REUS W/ TWL XL LVL3 (GOWN DISPOSABLE) ×2 IMPLANT
GOWN STRL REUS W/TWL XL LVL3 (GOWN DISPOSABLE) ×3
GUIDEWIRE STR DUAL SENSOR (WIRE) ×3 IMPLANT
HOLDER FOLEY CATH W/STRAP (MISCELLANEOUS) IMPLANT
IV NS IRRIG 3000ML ARTHROMATIC (IV SOLUTION) ×3 IMPLANT
KIT TURNOVER KIT A (KITS) ×3 IMPLANT
LASER FIB FLEXIVA PULSE ID 365 (Laser) IMPLANT
LASER FIB FLEXIVA PULSE ID 550 (Laser) IMPLANT
LASER FIB FLEXIVA PULSE ID 910 (Laser) IMPLANT
LOOP CUT BIPOLAR 24F LRG (ELECTROSURGICAL) IMPLANT
MANIFOLD NEPTUNE II (INSTRUMENTS) ×3 IMPLANT
PACK CYSTO (CUSTOM PROCEDURE TRAY) ×3 IMPLANT
SET IRRIG Y TYPE TUR BLADDER L (SET/KITS/TRAYS/PACK) ×2 IMPLANT
SHEATH NAVIGATOR HD 11/13X36 (SHEATH) ×1 IMPLANT
STENT URET 6FRX26 CONTOUR (STENTS) ×1 IMPLANT
SUT ETHILON 3 0 PS 1 (SUTURE) IMPLANT
SYR 30ML LL (SYRINGE) IMPLANT
SYR TOOMEY IRRIG 70ML (MISCELLANEOUS)
SYRINGE TOOMEY IRRIG 70ML (MISCELLANEOUS) IMPLANT
TRACTIP FLEXIVA PULS ID 200XHI (Laser) IMPLANT
TRACTIP FLEXIVA PULSE ID 200 (Laser)
TUBING CONNECTING 10 (TUBING) ×3 IMPLANT
TUBING UROLOGY SET (TUBING) ×3 IMPLANT

## 2022-05-16 NOTE — Interval H&P Note (Signed)
History and Physical Interval Note:  05/16/2022 8:26 AM  Jonathan Lane  has presented today for surgery, with the diagnosis of BLADDER AND LEFT URETERAL CANCER.  The various methods of treatment have been discussed with the patient and family. After consideration of risks, benefits and other options for treatment, the patient has consented to  Procedure(s) with comments: CYSTOSCOPY WITH RIGHT RETROGRADE PYELOGRAM, LEFT URETEROSCOPY WITH POSSIBLE BIOPSY AND   STENT REMOVAL (Bilateral) - 60 MINUTES CYSTOSCOPY WITH BIOPSY (N/A) as a surgical intervention.  The patient's history has been reviewed, patient examined, no change in status, stable for surgery.  I have reviewed the patient's chart and labs.  Questions were answered to the patient's satisfaction.     [name]

## 2022-05-16 NOTE — Transfer of Care (Signed)
Immediate Anesthesia Transfer of Care Note  Patient: Jonathan Lane  Procedure(s) Performed: Procedure(s) with comments: CYSTOSCOPY WITH BILATERAL RETROGRADE PYELOGRAM, LEFT URETEROSCOPY WITH BIOPSY, HOLMIUM LASER OF LEFT URETERAL STONE  AND LEFT STENT EXCHANGE, TUR LEFT URETERAL ORIFACE (Bilateral) - 71 MINUTES CYSTOSCOPY WITH BIOPSY (N/A)  Patient Location: PACU  Anesthesia Type:General  Level of Consciousness:  sedated, patient cooperative and responds to stimulation  Airway & Oxygen Therapy:Patient Spontanous Breathing and Patient connected to face mask oxgen  Post-op Assessment:  Report given to PACU RN and Post -op Vital signs reviewed and stable  Post vital signs:  Reviewed and stable  Last Vitals:  Vitals:   05/16/22 0734 05/16/22 1005  BP: (!) 157/67 138/71  Pulse: (!) 51 66  Resp: 16 14  Temp: 36.7 C   SpO2: 28% 315%    Complications: No apparent anesthesia complications

## 2022-05-16 NOTE — Anesthesia Procedure Notes (Signed)
Procedure Name: LMA Insertion Date/Time: 05/16/2022 9:03 AM Performed by: Lavina Hamman, CRNA Pre-anesthesia Checklist: Patient identified, Emergency Drugs available, Suction available and Patient being monitored Patient Re-evaluated:Patient Re-evaluated prior to induction Oxygen Delivery Method: Circle System Utilized Preoxygenation: Pre-oxygenation with 100% oxygen Induction Type: IV induction Ventilation: Mask ventilation without difficulty LMA: LMA inserted LMA Size: 5.0 Number of attempts: 1 Airway Equipment and Method: Bite block Placement Confirmation: positive ETCO2 Tube secured with: Tape Dental Injury: Teeth and Oropharynx as per pre-operative assessment

## 2022-05-16 NOTE — Op Note (Signed)
Procedure: 1.  Cystoscopy with right retrograde pyelogram and interpretation. 2.  Left ureteroscopy with holmium laser lithotripsy and stent exchange. 3.  Left ureteroscopy with ureteral biopsy. 4.  Transurethral resection of left trigone/distal ureteral tumor. 5.  Application of fluoroscopy.  Preop diagnosis: 1.  History of bladder and left ureteral urothelial neoplasm. 2.  Left renal stone.  Postop diagnosis: 1.  History of bladder cancer. 2.  Left distal ureteral neoplasm. 3.  Left renal stone.  Surgeon: Dr. [name].  Anesthesia: General.  Specimen: 1.  Left ureteral biopsy. 2.  TURB chips of left trigone/distal ureter. 3.  Stone fragments.  Drain: 6 x 26 French left double-J stent and Foley catheter.  EBL: None.  Complications: None.  Indications: The patient is a [age] year old male with a history of urothelial neoplasm of the bladder and left distal ureter as well as left lower pole renal stone who has previously undergone resection, ureteral biopsy with laser and BCG induction.  He returns now for reevaluation.  Procedure: He was taken operating room where he was given 2 g of Ancef.  A general anesthetic was induced.  He was placed in lithotomy position and fitted with PAS hose.  His perineum and genitalia were prepped with Betadine solution and draped in usual sterile fashion.  Cystoscopy was performed using the 21 [place] scope and the 30 degree lens.  Inspection revealed a normal urethra.  The external sphincter was intact.  The prostatic urethra approximately 3 cm in length with lateral lobe enlargement and some obstruction.  Examination of bladder demonstrated mild trabeculation with a few cellules.  No papillary tumor fronds or areas suggestive of carcinoma in situ were noted.  The right ureteral orifice was unremarkable.  The left ureteral orifice contained the stent and there was some fresh clot at the orifice and there was some edema and erythema surrounding the  orifice with small yellow pustules consistent with BCG effect.  A 5 French opening catheter and Omnipaque was then used to perform a right retrograde pyelogram.  The right retrograde pyelogram demonstrated normal ureter and intrarenal collecting system.  The left stent was then grasped and pulled the urethral meatus and a sensor wire was advanced through the stent to the kidney under fluoroscopic guidance.  The 6.5 French short semirigid ureteroscope was then advanced alongside the wire up the ureteral orifice.  There was a small amount of fresh clot just inside the orifice and some tissue change suggestive of possible residual tumor although it was more necrotic in nature than papillary.  More proximal inspection to the extent of the ureteroscope demonstrated no additional mucosal changes and a well dilated noninflamed ureter.  I then used an engage basket to take biopsies from the tissue in the distal ureter and these will be sent as a separate specimen.  A single-lumen flexible ureteroscope was then advanced to the kidney under visual guidance and no tumors were noted in the proximal ureter and intrarenal collecting system however the stone was readily visualized in the lower calyx and did not appear to be attached.  It was felt that management of stone was indicated to avoid future complications.  A 200 m tract tip laser fiber was then passed and the Moses laser was set on the dusting setting.  The stone was then broken into dust and grit with very few small residual fragments.  2 of the larger fragments were removed to ensure that the residual stone was sufficiently small to pass without difficulty.  Retrieved fragments were approximately 2 mm.  Fluoroscopic inspection demonstrated excellent fragmentation of the stone.  The ureteroscope was removed and a continuous-flow resectoscope was then passed along side the wire using the visual obturator.  The visual obturator was replaced with an  Beatrix Fetters handle with a bipolar loop and 30 degree lens.  I then resected tissue from the trigone and distal ureter around the wire.  I did protect the wire with a 5 [place] open-ended catheter after I did note an obturator reflex before placing the open-ended catheter.  I was able to resect several pieces of tissue to send for analysis and fulgurated additional abnormal tissue in area of the cut end of the distal ureter.  The resectoscope and ended and catheter were removed and the cystoscope was passed over the sensor wire without difficulty.  A 6 x 26 French contour double-J stent without tether was then advanced the kidney under fluoroscopic guidance.  The wire was removed, leaving good coil in the kidney and a good coil in the bladder.  The specimen from the resection was then retrieved and sent as a separate specimen.  The bladder was inspected and no significant bleeding was noted but I felt the Foley catheter was indicated because of the resection.  A 20 French Foley with a 10 cc balloon was passed and the balloon was inflated.  There was clear drainage.  Cath was placed to straight drainage.  He was taken down from lithotomy position, his anesthetic was reversed and he was moved recovery room in stable condition.  There were no complications.  The biopsies were sent to pathology and the stone fragments were taken to the patient's family and will be brought to the office for analysis.  Marland Kitchen

## 2022-05-16 NOTE — Discharge Instructions (Addendum)
You may remove the catheter at home in the morning following the instruction sheet.

## 2022-05-16 NOTE — Anesthesia Postprocedure Evaluation (Signed)
Anesthesia Post Note  Patient: Jonathan Lane  Procedure(s) Performed: CYSTOSCOPY WITH BILATERAL RETROGRADE PYELOGRAM, LEFT URETEROSCOPY WITH BIOPSY, HOLMIUM LASER OF LEFT URETERAL STONE  AND LEFT STENT EXCHANGE, TUR LEFT URETERAL ORIFACE (Bilateral) CYSTOSCOPY WITH BIOPSY     Patient location during evaluation: PACU Anesthesia Type: General Level of consciousness: awake and alert Pain management: pain level controlled Vital Signs Assessment: post-procedure vital signs reviewed and stable Respiratory status: spontaneous breathing, nonlabored ventilation, respiratory function stable and patient connected to nasal cannula oxygen Cardiovascular status: blood pressure returned to baseline and stable Postop Assessment: no apparent nausea or vomiting Anesthetic complications: no   No notable events documented.  Last Vitals:  Vitals:   05/16/22 1015 05/16/22 1030  BP: (!) 141/72 (!) 145/78  Pulse: 61 63  Resp: 11 12  Temp:  36.4 C  SpO2: 100% 95%    Last Pain:  Vitals:   05/16/22 1030  TempSrc:   PainSc: 0-No pain                 Barnet Glasgow

## 2022-05-17 ENCOUNTER — Encounter (HOSPITAL_COMMUNITY): Payer: Self-pay | Admitting: Urology

## 2022-05-17 ENCOUNTER — Other Ambulatory Visit: Payer: Self-pay | Admitting: *Deleted

## 2022-05-17 ENCOUNTER — Encounter: Payer: PPO | Admitting: Adult Health

## 2022-05-17 DIAGNOSIS — I723 Aneurysm of iliac artery: Secondary | ICD-10-CM

## 2022-05-17 LAB — SURGICAL PATHOLOGY

## 2022-05-17 NOTE — Progress Notes (Signed)
This encounter was created in error - please disregard.

## 2022-05-25 ENCOUNTER — Ambulatory Visit (HOSPITAL_COMMUNITY)
Admission: RE | Admit: 2022-05-25 | Discharge: 2022-05-25 | Disposition: A | Discharge status: Home / Self Care | Payer: PPO | Source: Ambulatory Visit | Attending: Vascular Surgery | Admitting: Vascular Surgery

## 2022-05-25 ENCOUNTER — Ambulatory Visit: Payer: PPO | Admitting: Vascular Surgery

## 2022-05-25 ENCOUNTER — Encounter: Payer: Self-pay | Admitting: Vascular Surgery

## 2022-05-25 VITALS — BP 157/82 | HR 51 | Temp 97.8°F | Resp 20 | Ht 73.0 in | Wt 190.0 lb

## 2022-05-25 DIAGNOSIS — I723 Aneurysm of iliac artery: Secondary | ICD-10-CM

## 2022-05-25 NOTE — Progress Notes (Signed)
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  Is 1.5 cm in maximum diameter.  I explained that this is very small.  We would not consider elective repair in a normal risk patient unless it reached 3.5 cm.  I think we can stretch his follow-up out to 2 years.  I have ordered a duplex at that time and he can be seen on the PA schedule at that time.  He is not a smoker.  His blood pressures been under good control.    HPI:   Jonathan Lane is a pleasant 81 y.o. 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  I recommended a follow-up duplex scan in 1 year and he comes in for that visit.  Of note he did not have an abdominal aortic aneurysm.  Since I saw him last he denies any significant abdominal pain or back pain.  He is not a smoker.  He quit in 1967.  His blood pressures been under good control.  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  Here currently has some recurrent disease in one of the distal urethra's and is followed closely by Dr. Jeffie Pollock.  He is considering his options to address this now.  Past Medical History:  Diagnosis Date   AAA (abdominal aortic aneurysm) (HCC)    Atrial fibrillation (HCC)    Bradycardia    Bradycardia    Cancer (HCC)    Cardiac murmur    Dizziness    Dysrhythmia    GERD (gastroesophageal reflux disease)    Glaucoma    Heart palpitations    Hyperlipidemia    Hypertension    Numbness 08/26/2019   LEFT FACE   Pneumonia    Skin abnormalities    pre cancerous lesion R hand   Sleep apnea    uses cpap   Snoring     Family History  Problem Relation Age of Onset   Stroke Father    Stroke Mother    Stroke Sister    Breast cancer Sister    High blood pressure Sister    Diabetes Sister    Colon cancer Other        Uncle   Colon cancer Paternal  Uncle    Mitral valve prolapse Other        sugery 09-27-2017   Rectal cancer Neg Hx    Stomach cancer Neg Hx     SOCIAL HISTORY: Social History   Tobacco Use   Smoking status: Former    Types: Cigarettes    Quit date: 12/18/1965    Years since quitting: 56.4   Smokeless tobacco: Never  Substance Use Topics   Alcohol use: Not Currently    Comment: 5-7 drinks per week    Allergies  Allergen Reactions   Brimonidine Tartrate Other (See Comments)    Irritates the eyes and increased pressure   Tetracycline Hcl Other (See Comments)    Doesn't remember reaction-"like 55 years ago"    Ciprofloxacin Other (See Comments)    Constipation    Current Outpatient Medications  Medication Sig Dispense Refill   acetaminophen (TYLENOL) 500 MG tablet Take 500 mg by mouth every 8 (eight) hours as needed for mild pain or headache.     Apoaequorin ([place])  20 MG CAPS Take 20 mg by mouth daily.     atorvastatin (LIPITOR) 20 MG tablet Take 1 tablet (20 mg total) by mouth daily. (Patient taking differently: Take 20 mg by mouth at bedtime.) 30 tablet 0   BCG vaccine 81 mg in sodium chloride (PF) 0.9 % 50 mL Instill 81 mg into the bladder once. Pt completed 6 wks cycle. Next treatment cycle to be determined.     cholecalciferol (VITAMIN D3) 25 MCG (1000 UNIT) tablet Take 1,000 Units by mouth daily.     clopidogrel (PLAVIX) 75 MG tablet Take 1 tablet (75 mg total) by mouth daily.     Coenzyme Q10 (COQ10) 200 MG CAPS Take 200 mg by mouth daily.     COSOPT PF 2-0.5 % SOLN ophthalmic solution Place 1 drop into both eyes in the morning and at bedtime.     CRANBERRY PO Take 1 tablet by mouth daily.     Cyanocobalamin (B-12 PO) Take 1,200 mcg by mouth daily.     DILT-XR 240 MG 24 hr capsule TAKE ONE CAPSULE BY MOUTH ONCE DAILY (Patient taking differently: Take 240 mg by mouth at bedtime.) 90 capsule 3   esomeprazole (NEXIUM) 20 MG capsule Take 20 mg by mouth 2 (two) times daily before a  meal.     fluticasone (FLONASE) 50 MCG/ACT nasal spray Place 2 sprays into both nostrils daily as needed for allergies or rhinitis.     Glucosamine-Chondroitin (MOVE FREE PO) Take 1 tablet by mouth daily.     losartan-hydrochlorothiazide (HYZAAR) 100-25 MG tablet TAKE ONE TABLET BY MOUTH ONCE DAILY 90 tablet 2   Melatonin 3 MG CAPS Take 6 mg by mouth at bedtime.     Misc Natural Products (PROSTATE HEALTH) CAPS Take 1 capsule by mouth in the morning.     Multiple Vitamins-Minerals (ADULT GUMMY) CHEW Chew 2 capsules by mouth daily.     Multiple Vitamins-Minerals (LUTEIN-ZEAXANTHIN) TABS Take 1 tablet by mouth daily.     Multiple Vitamins-Minerals (PRESERVISION AREDS 2 PO) Take 1 capsule by mouth in the morning and at bedtime.     Multiple Vitamins-Minerals (ZINC PO) Take 12 mg by mouth daily.     nitroGLYCERIN (NITROSTAT) 0.4 MG SL tablet Place 1 tablet (0.4 mg total) under the tongue every 5 (five) minutes as needed for chest pain. 10 tablet 1   Omega-3 Fatty Acids (FISH OIL) 1200 MG CAPS Take 1,200 mg by mouth in the morning and at bedtime.     OVER THE COUNTER MEDICATION Take 1 tablet by mouth daily. Saffron supplement     OVER THE COUNTER MEDICATION Take 1 tablet by mouth daily. Focus Factor otc supplement     Probiotic Product (ALIGN PO) Take 1 capsule by mouth in the morning.     RHOPRESSA 0.02 % SOLN Place 1 drop into both eyes at bedtime.      tadalafil (CIALIS) 5 MG tablet Take 5 mg by mouth daily.     valACYclovir (VALTREX) 1000 MG tablet Take 1,000 mg by mouth 2 (two) times daily as needed (for outbreaks).     Vibegron (GEMTESA) 75 MG TABS Take 75 mg by mouth daily.     No current facility-administered medications for this visit.    REVIEW OF SYSTEMS:  '[X]'$  denotes positive finding, '[ ]'$  denotes negative finding Cardiac  Comments:  Chest pain or chest pressure:    Shortness of breath upon exertion:    Short of breath when lying flat:  Irregular heart rhythm:        Vascular     Pain in calf, thigh, or hip brought on by ambulation:    Pain in feet at night that wakes you up from your sleep:     Blood clot in your veins:    Leg swelling:         Pulmonary    Oxygen at home:    Productive cough:     Wheezing:         Neurologic    Sudden weakness in arms or legs:     Sudden numbness in arms or legs:     Sudden onset of difficulty speaking or slurred speech:    Temporary loss of vision in one eye:     Problems with dizziness:         Gastrointestinal    Blood in stool:     Vomited blood:         Genitourinary    Burning when urinating:     Blood in urine:        Psychiatric    Major depression:         Hematologic    Bleeding problems:    Problems with blood clotting too easily:        Skin    Rashes or ulcers:        Constitutional    Fever or chills:     PHYSICAL EXAM:   Vitals:   05/25/22 1038  BP: (!) 157/82  Pulse: (!) 51  Resp: 20  Temp: 97.8 F (36.6 C)  SpO2: 95%  Weight: 190 lb (86.2 kg)  Height: '6\' 1"'$  (1.854 m)    GENERAL: The patient is a well-nourished male, in no acute distress. The vital signs are documented above. CARDIAC: There is a regular rate and rhythm.  VASCULAR: I do not detect carotid bruits. He has palpable femoral and posterior tibial pulses bilaterally. PULMONARY: There is good air exchange bilaterally without wheezing or rales. ABDOMEN: Soft and non-tender with normal pitched bowel sounds.  MUSCULOSKELETAL: There are no major deformities or cyanosis. NEUROLOGIC: No focal weakness or paresthesias are detected. SKIN: There are no ulcers or rashes noted. PSYCHIATRIC: The patient has a normal affect.  DATA:    DUPLEX ABDOMINAL AORTA: I have independently interpreted his duplex of the abdominal aorta.  The patient did not have an abdominal aortic aneurysm.  The maximum diameter of the right common iliac artery measures 1.5 cm in maximum diameter.  The maximum diameter of his left common iliac artery  measures 1.3 cm in maximum diameter.  Deitra Mayo [facility] [phone]

## 2022-05-29 DIAGNOSIS — C689 Malignant neoplasm of urinary organ, unspecified: Secondary | ICD-10-CM | POA: Diagnosis not present

## 2022-05-29 DIAGNOSIS — J310 Chronic rhinitis: Secondary | ICD-10-CM | POA: Diagnosis not present

## 2022-05-29 DIAGNOSIS — R7309 Other abnormal glucose: Secondary | ICD-10-CM | POA: Diagnosis not present

## 2022-05-29 DIAGNOSIS — I10 Essential (primary) hypertension: Secondary | ICD-10-CM | POA: Diagnosis not present

## 2022-05-29 DIAGNOSIS — F411 Generalized anxiety disorder: Secondary | ICD-10-CM | POA: Diagnosis not present

## 2022-05-30 DIAGNOSIS — C670 Malignant neoplasm of trigone of bladder: Secondary | ICD-10-CM | POA: Diagnosis not present

## 2022-05-30 DIAGNOSIS — C662 Malignant neoplasm of left ureter: Secondary | ICD-10-CM | POA: Diagnosis not present

## 2022-05-30 DIAGNOSIS — N200 Calculus of kidney: Secondary | ICD-10-CM | POA: Diagnosis not present

## 2022-06-07 DIAGNOSIS — K219 Gastro-esophageal reflux disease without esophagitis: Secondary | ICD-10-CM | POA: Diagnosis not present

## 2022-06-07 DIAGNOSIS — I10 Essential (primary) hypertension: Secondary | ICD-10-CM | POA: Diagnosis not present

## 2022-06-07 DIAGNOSIS — E782 Mixed hyperlipidemia: Secondary | ICD-10-CM | POA: Diagnosis not present

## 2022-06-07 DIAGNOSIS — N400 Enlarged prostate without lower urinary tract symptoms: Secondary | ICD-10-CM | POA: Diagnosis not present

## 2022-06-08 DIAGNOSIS — N200 Calculus of kidney: Secondary | ICD-10-CM | POA: Diagnosis not present

## 2022-06-08 DIAGNOSIS — C662 Malignant neoplasm of left ureter: Secondary | ICD-10-CM | POA: Diagnosis not present

## 2022-06-08 DIAGNOSIS — C670 Malignant neoplasm of trigone of bladder: Secondary | ICD-10-CM | POA: Diagnosis not present

## 2022-06-08 DIAGNOSIS — G4733 Obstructive sleep apnea (adult) (pediatric): Secondary | ICD-10-CM | POA: Diagnosis not present

## 2022-06-08 DIAGNOSIS — N5201 Erectile dysfunction due to arterial insufficiency: Secondary | ICD-10-CM | POA: Diagnosis not present

## 2022-06-12 ENCOUNTER — Other Ambulatory Visit: Payer: Self-pay | Admitting: Urology

## 2022-06-13 ENCOUNTER — Ambulatory Visit: Payer: PPO | Admitting: Physician Assistant

## 2022-06-14 ENCOUNTER — Ambulatory Visit: Payer: Self-pay | Admitting: *Deleted

## 2022-06-14 NOTE — Telephone Encounter (Signed)
Left message.  Pt. Having surgery 07/26/22. Concerned he will need assistance at home afterward, asking about home health. Instructed to speak with his surgeon to assist him with this. Verbalizes understanding.   Answer Assessment - Initial Assessment Questions 1. REASON FOR CALL: "What is your main concern right now?"     Asking about home health after his surgery 07/26/22 2. ONSET: "When did the n/a start?"     N/a 3. SEVERITY: "How bad is the n/a?"     N/a 4. FEVER: "Do you have a fever?"     N/a 5. OTHER SYMPTOMS: "Do you have any other new symptoms?"     N/a 6. TREATMENTS AND RESPONSE: "What have you done so far to try to make this better? What medicines have you used?"     N/a 7. PREGNANCY: "Is there any chance you are pregnant?" "When was your last menstrual period?"     N/a  Protocols used: No Guideline Available-[facility]

## 2022-06-14 NOTE — Telephone Encounter (Signed)
Left message

## 2022-07-05 DIAGNOSIS — D0439 Carcinoma in situ of skin of other parts of face: Secondary | ICD-10-CM | POA: Diagnosis not present

## 2022-07-05 DIAGNOSIS — L820 Inflamed seborrheic keratosis: Secondary | ICD-10-CM | POA: Diagnosis not present

## 2022-07-05 DIAGNOSIS — D485 Neoplasm of uncertain behavior of skin: Secondary | ICD-10-CM | POA: Diagnosis not present

## 2022-07-11 DIAGNOSIS — C662 Malignant neoplasm of left ureter: Secondary | ICD-10-CM | POA: Diagnosis not present

## 2022-07-11 DIAGNOSIS — C670 Malignant neoplasm of trigone of bladder: Secondary | ICD-10-CM | POA: Diagnosis not present

## 2022-07-17 ENCOUNTER — Encounter (HOSPITAL_COMMUNITY): Admission: RE | Admit: 2022-07-17 | Payer: PPO | Source: Ambulatory Visit

## 2022-07-17 NOTE — Progress Notes (Signed)
COVID Vaccine Completed:  Date of COVID positive in last 90 days:  PCP - Sela Hilding, MD Cardiologist - [name], MD  Chest x-ray - 12/18/21 Epic EKG - 12/20/21 Epic Stress Test - 03/26/18 Epic ECHO - 08/18/19 Epic Cardiac Cath -  Pacemaker/ICD device last checked: Spinal Cord Stimulator:  Bowel Prep -   Sleep Study -  CPAP -   Fasting Blood Sugar -  Checks Blood Sugar _____ times a day  Blood Thinner Instructions: Plavix Aspirin Instructions: Last Dose:  Activity level:  Can go up a flight of stairs and perform activities of daily living without stopping and without symptoms of chest pain or shortness of breath.  Able to exercise without symptoms  Unable to go up a flight of stairs without symptoms of     Anesthesia review: HTN, a fib, TIA, OSA, cardiac clearance?  Patient denies shortness of breath, fever, cough and chest pain at PAT appointment  Patient verbalized understanding of instructions that were given to them at the PAT appointment. Patient was also instructed that they will need to review over the PAT instructions again at home before surgery.

## 2022-07-17 NOTE — Patient Instructions (Signed)
SURGICAL WAITING ROOM VISITATION Patients having surgery or a procedure may have no more than 2 support people in the waiting area - these visitors may rotate.   Children under the age of 5 must have an adult with them who is not the patient. If the patient needs to stay at the hospital during part of their recovery, the visitor guidelines for inpatient rooms apply. Pre-op nurse will coordinate an appropriate time for 1 support person to accompany patient in pre-op.  This support person may not rotate.    Please refer to the [facility] website for the visitor guidelines for Inpatients (after your surgery is over and you are in a regular room).    Your procedure is scheduled on: 07/26/22   [facility]    Report to admitting at 9:45 AM   Call this number if you have problems the morning of surgery [phone]   Follow clear liquid diet the day before surgery.   You may have the following liquids until 9:00 AM DAY OF SURGERY  Water Non-Citrus Juices (without pulp, NO RED) Carbonated Beverages Black Coffee (NO MILK/CREAM OR CREAMERS, sugar ok)  Clear Tea (NO MILK/CREAM OR CREAMERS, sugar ok) regular and decaf                             Plain Jell-O (NO RED)                                           Fruit ices (not with fruit pulp, NO RED)                                     Popsicles (NO RED)                                                               Sports drinks like Gatorade (NO RED)              FOLLOW BOWEL PREP AND ANY ADDITIONAL PRE OP INSTRUCTIONS YOU RECEIVED FROM YOUR SURGEON'S OFFICE!!!     Oral Hygiene is also important to reduce your risk of infection.                                    Remember - BRUSH YOUR TEETH THE MORNING OF SURGERY WITH YOUR REGULAR TOOTHPASTE   Take these medicines the morning of surgery with A SIP OF WATER: Tylenol, Nexium   These are anesthesia recommendations for holding your anticoagulants.  Please contact  your prescribing physician to confirm IF it is safe to hold your anticoagulants for this length of time.   Eliquis Apixaban   72 hours   Xarelto Rivaroxaban   72 hours  Plavix Clopidogrel   120 hours  Pletal Cilostazol   120 hours    Bring CPAP mask and tubing day of surgery.  You may not have any metal on your body including jewelry, and body piercing             Do not wear lotions, powders, cologne, or deodorant              Men may shave face and neck.   Do not bring valuables to the hospital. [place].   Bring small overnight bag day of surgery.   DO NOT [place]. PHARMACY WILL DISPENSE MEDICATIONS LISTED ON YOUR MEDICATION LIST TO YOU DURING YOUR ADMISSION [place]!               Please read over the following fact sheets you were given: IF YOU HAVE QUESTIONS ABOUT YOUR PRE-OP INSTRUCTIONS PLEASE CALL [place] - Preparing for Surgery Before surgery, you can play an important role.  Because skin is not sterile, your skin needs to be as free of germs as possible.  You can reduce the number of germs on your skin by washing with CHG (chlorahexidine gluconate) soap before surgery.  CHG is an antiseptic cleaner which kills germs and bonds with the skin to continue killing germs even after washing. Please DO NOT use if you have an allergy to CHG or antibacterial soaps.  If your skin becomes reddened/irritated stop using the CHG and inform your nurse when you arrive at Short Stay. Do not shave (including legs and underarms) for at least 48 hours prior to the first CHG shower.  You may shave your face/neck.  Please follow these instructions carefully:  1.  Shower with CHG Soap the night before surgery and the  morning of surgery.  2.  If you choose to wash your hair, wash your hair first as usual with your normal  shampoo.  3.  After you  shampoo, rinse your hair and body thoroughly to remove the shampoo.                             4.  Use CHG as you would any other liquid soap.  You can apply chg directly to the skin and wash.  Gently with a scrungie or clean washcloth.  5.  Apply the CHG Soap to your body ONLY FROM THE NECK DOWN.   Do   not use on face/ open                           Wound or open sores. Avoid contact with eyes, ears mouth and   genitals (private parts).                       Wash face,  Genitals (private parts) with your normal soap.             6.  Wash thoroughly, paying special attention to the area where your    surgery  will be performed.  7.  Thoroughly rinse your body with warm water from the neck down.  8.  DO NOT shower/wash with your normal soap after using and rinsing off the CHG Soap.                9.  Pat yourself dry with a clean towel.  10.  Wear clean pajamas.            11.  Place clean sheets on your bed the night of your first shower and do not  sleep with pets. Day of Surgery : Do not apply any lotions/deodorants the morning of surgery.  Please wear clean clothes to the hospital/surgery center.  FAILURE TO FOLLOW THESE INSTRUCTIONS MAY RESULT IN THE CANCELLATION OF YOUR SURGERY  PATIENT SIGNATURE_________________________________  NURSE SIGNATURE__________________________________  ________________________________________________________________________

## 2022-07-18 ENCOUNTER — Encounter (HOSPITAL_COMMUNITY)
Admission: RE | Admit: 2022-07-18 | Discharge: 2022-07-18 | Disposition: A | Discharge status: Home / Self Care | Payer: PPO | Source: Ambulatory Visit | Attending: Urology | Admitting: Urology

## 2022-07-18 ENCOUNTER — Other Ambulatory Visit (HOSPITAL_COMMUNITY): Payer: Self-pay

## 2022-07-18 ENCOUNTER — Encounter (HOSPITAL_COMMUNITY): Payer: Self-pay

## 2022-07-18 VITALS — BP 136/72 | HR 61 | Temp 98.5°F | Resp 14 | Ht 73.0 in | Wt 193.2 lb

## 2022-07-18 DIAGNOSIS — Z01812 Encounter for preprocedural laboratory examination: Secondary | ICD-10-CM | POA: Diagnosis not present

## 2022-07-18 DIAGNOSIS — I10 Essential (primary) hypertension: Secondary | ICD-10-CM | POA: Insufficient documentation

## 2022-07-18 LAB — CBC
HCT: 40.1 % (ref 39.0–52.0)
Hemoglobin: 13.4 g/dL (ref 13.0–17.0)
MCH: 32.3 pg (ref 26.0–34.0)
MCHC: 33.4 g/dL (ref 30.0–36.0)
MCV: 96.6 fL (ref 80.0–100.0)
Platelets: 190 10*3/uL (ref 150–400)
RBC: 4.15 MIL/uL — ABNORMAL LOW (ref 4.22–5.81)
RDW: 14 % (ref 11.5–15.5)
WBC: 6.5 10*3/uL (ref 4.0–10.5)
nRBC: 0 % (ref 0.0–0.2)

## 2022-07-18 LAB — BASIC METABOLIC PANEL
Anion gap: 6 (ref 5–15)
BUN: 15 mg/dL (ref 8–23)
CO2: 27 mmol/L (ref 22–32)
Calcium: 8.8 mg/dL — ABNORMAL LOW (ref 8.9–10.3)
Chloride: 104 mmol/L (ref 98–111)
Creatinine, Ser: 0.86 mg/dL (ref 0.61–1.24)
GFR, Estimated: >60 mL/min (ref >60)
Glucose, Bld: 111 mg/dL — ABNORMAL HIGH (ref 70–99)
Potassium: 3.8 mmol/L (ref 3.5–5.1)
Sodium: 137 mmol/L (ref 135–145)

## 2022-07-19 DIAGNOSIS — H35371 Puckering of macula, right eye: Secondary | ICD-10-CM | POA: Diagnosis not present

## 2022-07-19 DIAGNOSIS — H43813 Vitreous degeneration, bilateral: Secondary | ICD-10-CM | POA: Diagnosis not present

## 2022-07-19 DIAGNOSIS — H353222 Exudative age-related macular degeneration, left eye, with inactive choroidal neovascularization: Secondary | ICD-10-CM | POA: Diagnosis not present

## 2022-07-19 DIAGNOSIS — H353211 Exudative age-related macular degeneration, right eye, with active choroidal neovascularization: Secondary | ICD-10-CM | POA: Diagnosis not present

## 2022-07-26 ENCOUNTER — Inpatient Hospital Stay (HOSPITAL_COMMUNITY): Payer: PPO | Admitting: Physician Assistant

## 2022-07-26 ENCOUNTER — Other Ambulatory Visit: Payer: Self-pay

## 2022-07-26 ENCOUNTER — Encounter (HOSPITAL_COMMUNITY)
Admission: RE | Disposition: A | Discharge status: Home / Self Care | Payer: Self-pay | Source: Home / Self Care | Attending: Urology

## 2022-07-26 ENCOUNTER — Inpatient Hospital Stay (HOSPITAL_COMMUNITY)
Admission: RE | Admit: 2022-07-26 | Discharge: 2022-07-31 | DRG: 654 | Disposition: A | Discharge status: Home / Self Care | Payer: PPO | Attending: Urology | Admitting: Urology

## 2022-07-26 ENCOUNTER — Encounter (HOSPITAL_COMMUNITY): Payer: Self-pay | Admitting: Urology

## 2022-07-26 ENCOUNTER — Inpatient Hospital Stay (HOSPITAL_COMMUNITY): Payer: PPO

## 2022-07-26 ENCOUNTER — Inpatient Hospital Stay (HOSPITAL_COMMUNITY): Payer: PPO | Admitting: Certified Registered Nurse Anesthetist

## 2022-07-26 ENCOUNTER — Other Ambulatory Visit (HOSPITAL_COMMUNITY): Payer: Self-pay

## 2022-07-26 DIAGNOSIS — E785 Hyperlipidemia, unspecified: Secondary | ICD-10-CM | POA: Diagnosis not present

## 2022-07-26 DIAGNOSIS — D4959 Neoplasm of unspecified behavior of other genitourinary organ: Principal | ICD-10-CM | POA: Diagnosis present

## 2022-07-26 DIAGNOSIS — K219 Gastro-esophageal reflux disease without esophagitis: Secondary | ICD-10-CM | POA: Diagnosis not present

## 2022-07-26 DIAGNOSIS — H409 Unspecified glaucoma: Secondary | ICD-10-CM | POA: Diagnosis not present

## 2022-07-26 DIAGNOSIS — K567 Ileus, unspecified: Secondary | ICD-10-CM | POA: Diagnosis not present

## 2022-07-26 DIAGNOSIS — C662 Malignant neoplasm of left ureter: Secondary | ICD-10-CM | POA: Diagnosis not present

## 2022-07-26 DIAGNOSIS — I714 Abdominal aortic aneurysm, without rupture, unspecified: Secondary | ICD-10-CM | POA: Diagnosis present

## 2022-07-26 DIAGNOSIS — Z8551 Personal history of malignant neoplasm of bladder: Secondary | ICD-10-CM

## 2022-07-26 DIAGNOSIS — Z87891 Personal history of nicotine dependence: Secondary | ICD-10-CM

## 2022-07-26 DIAGNOSIS — I10 Essential (primary) hypertension: Secondary | ICD-10-CM | POA: Diagnosis not present

## 2022-07-26 DIAGNOSIS — Z9989 Dependence on other enabling machines and devices: Secondary | ICD-10-CM | POA: Diagnosis not present

## 2022-07-26 DIAGNOSIS — G4733 Obstructive sleep apnea (adult) (pediatric): Secondary | ICD-10-CM

## 2022-07-26 DIAGNOSIS — D0919 Carcinoma in situ of other urinary organs: Secondary | ICD-10-CM | POA: Diagnosis not present

## 2022-07-26 DIAGNOSIS — I4891 Unspecified atrial fibrillation: Secondary | ICD-10-CM | POA: Diagnosis not present

## 2022-07-26 DIAGNOSIS — C676 Malignant neoplasm of ureteric orifice: Secondary | ICD-10-CM | POA: Diagnosis not present

## 2022-07-26 HISTORY — PX: CYSTOSCOPY WITH RETROGRADE PYELOGRAM, URETEROSCOPY AND STENT PLACEMENT: SHX5789

## 2022-07-26 HISTORY — PX: LYMPH NODE DISSECTION: SHX5087

## 2022-07-26 LAB — HEMOGLOBIN AND HEMATOCRIT, BLOOD
HCT: 43.1 % (ref 39.0–52.0)
Hemoglobin: 14.3 g/dL (ref 13.0–17.0)

## 2022-07-26 SURGERY — REIMPLANTATION, URETER, ROBOT-ASSISTED, LAPAROSCOPIC
Anesthesia: General | Site: Penis | Laterality: Left

## 2022-07-26 MED ORDER — FENTANYL CITRATE (PF) 250 MCG/5ML IJ SOLN
INTRAMUSCULAR | Status: AC
  Filled 2022-07-26: qty 5

## 2022-07-26 MED ORDER — ONDANSETRON HCL 4 MG/2ML IJ SOLN
INTRAMUSCULAR | Status: AC
  Filled 2022-07-26: qty 2

## 2022-07-26 MED ORDER — LIDOCAINE HCL (PF) 2 % IJ SOLN
INTRAMUSCULAR | Status: AC
Start: 2022-07-26 — End: ?
  Filled 2022-07-26: qty 5

## 2022-07-26 MED ORDER — BUPIVACAINE LIPOSOME 1.3 % IJ SUSP
INTRAMUSCULAR | Status: AC
Start: 2022-07-26 — End: ?
  Filled 2022-07-26: qty 20

## 2022-07-26 MED ORDER — ONDANSETRON HCL 4 MG/2ML IJ SOLN: 4.0000 mg | Freq: Once | INTRAMUSCULAR | Status: DC | PRN

## 2022-07-26 MED ORDER — PHENYLEPHRINE HCL-NACL 20-0.9 MG/250ML-% IV SOLN
INTRAVENOUS | Status: DC | PRN
  Administered 2022-07-26: 30 ug/min via INTRAVENOUS

## 2022-07-26 MED ORDER — OXYCODONE HCL 5 MG PO TABS
5.0000 mg | ORAL_TABLET | ORAL | Status: DC | PRN
  Administered 2022-07-27 – 2022-07-29 (×6): 5 mg via ORAL
  Filled 2022-07-26 (×9): qty 1

## 2022-07-26 MED ORDER — TRIPLE ANTIBIOTIC 3.5-400-5000 EX OINT: 1.0000 | TOPICAL_OINTMENT | Freq: Three times a day (TID) | CUTANEOUS | Status: DC | PRN

## 2022-07-26 MED ORDER — DILTIAZEM HCL ER COATED BEADS 240 MG PO CP24
240.0000 mg | ORAL_CAPSULE | Freq: Every day | ORAL | Status: DC
  Administered 2022-07-26 – 2022-07-30 (×5): 240 mg via ORAL
  Filled 2022-07-26 (×5): qty 1

## 2022-07-26 MED ORDER — PANTOPRAZOLE SODIUM 40 MG PO TBEC
40.0000 mg | DELAYED_RELEASE_TABLET | Freq: Every day | ORAL | Status: DC
  Administered 2022-07-26 – 2022-07-31 (×6): 40 mg via ORAL
  Filled 2022-07-26 (×6): qty 1

## 2022-07-26 MED ORDER — FENTANYL CITRATE (PF) 100 MCG/2ML IJ SOLN
INTRAMUSCULAR | Status: DC | PRN
Start: 2022-07-26 — End: 2022-07-26
  Administered 2022-07-26: 75 ug via INTRAVENOUS
  Administered 2022-07-26 (×2): 50 ug via INTRAVENOUS
  Administered 2022-07-26: 75 ug via INTRAVENOUS

## 2022-07-26 MED ORDER — DILTIAZEM HCL ER 240 MG PO CP24: 240.0000 mg | ORAL_CAPSULE | Freq: Every day | ORAL | Status: DC

## 2022-07-26 MED ORDER — OXYCODONE HCL 5 MG PO TABS
ORAL_TABLET | ORAL | Status: AC
  Filled 2022-07-26: qty 1

## 2022-07-26 MED ORDER — LOSARTAN POTASSIUM 50 MG PO TABS
100.0000 mg | ORAL_TABLET | Freq: Every day | ORAL | Status: DC
  Administered 2022-07-26 – 2022-07-31 (×6): 100 mg via ORAL
  Filled 2022-07-26 (×6): qty 2

## 2022-07-26 MED ORDER — LOSARTAN POTASSIUM-HCTZ 100-25 MG PO TABS: 1.0000 | ORAL_TABLET | Freq: Every day | ORAL | Status: DC

## 2022-07-26 MED ORDER — LACTATED RINGERS IV SOLN: INTRAVENOUS | Status: DC

## 2022-07-26 MED ORDER — LACTATED RINGERS IV SOLN: INTRAVENOUS | Status: DC | PRN

## 2022-07-26 MED ORDER — LIDOCAINE 2% (20 MG/ML) 5 ML SYRINGE
INTRAMUSCULAR | Status: DC | PRN
  Administered 2022-07-26: 80 mg via INTRAVENOUS

## 2022-07-26 MED ORDER — HYDROMORPHONE HCL 1 MG/ML IJ SOLN
0.5000 mg | INTRAMUSCULAR | Status: DC | PRN
  Administered 2022-07-29: 1 mg via INTRAVENOUS
  Filled 2022-07-26 (×2): qty 1

## 2022-07-26 MED ORDER — ACETAMINOPHEN 500 MG PO TABS
1000.0000 mg | ORAL_TABLET | Freq: Four times a day (QID) | ORAL | Status: AC
  Administered 2022-07-26 – 2022-07-27 (×3): 1000 mg via ORAL
  Filled 2022-07-26 (×4): qty 2

## 2022-07-26 MED ORDER — TRAMADOL HCL 50 MG PO TABS
50.0000 mg | ORAL_TABLET | Freq: Four times a day (QID) | ORAL | 0 refills | Status: DC | PRN
  Filled 2022-07-26: qty 20, 3d supply, fill #0

## 2022-07-26 MED ORDER — ONDANSETRON HCL 4 MG/2ML IJ SOLN
4.0000 mg | INTRAMUSCULAR | Status: DC | PRN
  Administered 2022-07-30: 4 mg via INTRAVENOUS
  Filled 2022-07-26 (×2): qty 2

## 2022-07-26 MED ORDER — CHLORHEXIDINE GLUCONATE 0.12 % MT SOLN
15.0000 mL | Freq: Once | OROMUCOSAL | Status: AC
  Administered 2022-07-26: 15 mL via OROMUCOSAL

## 2022-07-26 MED ORDER — IOHEXOL 300 MG/ML  SOLN
INTRAMUSCULAR | Status: DC | PRN
  Administered 2022-07-26: 16 mL

## 2022-07-26 MED ORDER — ACETAMINOPHEN 500 MG PO TABS
1000.0000 mg | ORAL_TABLET | Freq: Once | ORAL | Status: AC
  Administered 2022-07-26: 1000 mg via ORAL
  Filled 2022-07-26: qty 2

## 2022-07-26 MED ORDER — EPHEDRINE 5 MG/ML INJ
INTRAVENOUS | Status: AC
  Filled 2022-07-26: qty 5

## 2022-07-26 MED ORDER — HYDROCHLOROTHIAZIDE 25 MG PO TABS
25.0000 mg | ORAL_TABLET | Freq: Every day | ORAL | Status: DC
  Administered 2022-07-26 – 2022-07-31 (×6): 25 mg via ORAL
  Filled 2022-07-26 (×6): qty 1

## 2022-07-26 MED ORDER — ATORVASTATIN CALCIUM 20 MG PO TABS
20.0000 mg | ORAL_TABLET | Freq: Every day | ORAL | Status: DC
  Administered 2022-07-26 – 2022-07-30 (×5): 20 mg via ORAL
  Filled 2022-07-26 (×5): qty 1

## 2022-07-26 MED ORDER — DIPHENHYDRAMINE HCL 12.5 MG/5ML PO ELIX: 12.5000 mg | ORAL_SOLUTION | Freq: Four times a day (QID) | ORAL | Status: DC | PRN

## 2022-07-26 MED ORDER — SUGAMMADEX SODIUM 200 MG/2ML IV SOLN
INTRAVENOUS | Status: DC | PRN
  Administered 2022-07-26: 200 mg via INTRAVENOUS

## 2022-07-26 MED ORDER — CHLORHEXIDINE GLUCONATE CLOTH 2 % EX PADS
6.0000 | MEDICATED_PAD | Freq: Every day | CUTANEOUS | Status: DC
Start: 2022-07-27 — End: 2022-07-31
  Administered 2022-07-27 – 2022-07-31 (×5): 6 via TOPICAL

## 2022-07-26 MED ORDER — SODIUM CHLORIDE 0.9 % IR SOLN
Status: DC | PRN
  Administered 2022-07-26: 3000 mL

## 2022-07-26 MED ORDER — DEXAMETHASONE SODIUM PHOSPHATE 10 MG/ML IJ SOLN
INTRAMUSCULAR | Status: DC | PRN
  Administered 2022-07-26: 8 mg via INTRAVENOUS

## 2022-07-26 MED ORDER — ACETAMINOPHEN 500 MG PO TABS
1000.0000 mg | ORAL_TABLET | Freq: Once | ORAL | Status: AC
Start: 2022-07-26 — End: 2022-07-26
  Administered 2022-07-26: 1000 mg via ORAL

## 2022-07-26 MED ORDER — FENTANYL CITRATE PF 50 MCG/ML IJ SOSY
PREFILLED_SYRINGE | INTRAMUSCULAR | Status: AC
  Filled 2022-07-26: qty 2

## 2022-07-26 MED ORDER — EPHEDRINE SULFATE-NACL 50-0.9 MG/10ML-% IV SOSY
PREFILLED_SYRINGE | INTRAVENOUS | Status: DC | PRN
  Administered 2022-07-26 (×2): 5 mg via INTRAVENOUS
  Administered 2022-07-26: 10 mg via INTRAVENOUS

## 2022-07-26 MED ORDER — NETARSUDIL DIMESYLATE 0.02 % OP SOLN
1.0000 [drp] | Freq: Every day | OPHTHALMIC | Status: DC
Start: 2022-07-26 — End: 2022-07-31
  Administered 2022-07-26 – 2022-07-30 (×4): 1 [drp] via OPHTHALMIC

## 2022-07-26 MED ORDER — LACTATED RINGERS IR SOLN
Status: DC | PRN
  Administered 2022-07-26: 1000 mL

## 2022-07-26 MED ORDER — CEFAZOLIN SODIUM-DEXTROSE 2-4 GM/100ML-% IV SOLN
2.0000 g | INTRAVENOUS | Status: AC
  Administered 2022-07-26: 2 g via INTRAVENOUS
  Filled 2022-07-26: qty 100

## 2022-07-26 MED ORDER — ROCURONIUM BROMIDE 10 MG/ML (PF) SYRINGE
PREFILLED_SYRINGE | INTRAVENOUS | Status: AC
  Filled 2022-07-26: qty 10

## 2022-07-26 MED ORDER — DIPHENHYDRAMINE HCL 50 MG/ML IJ SOLN: 12.5000 mg | Freq: Four times a day (QID) | INTRAMUSCULAR | Status: DC | PRN

## 2022-07-26 MED ORDER — PROPOFOL 10 MG/ML IV BOLUS
INTRAVENOUS | Status: DC | PRN
  Administered 2022-07-26: 130 mg via INTRAVENOUS

## 2022-07-26 MED ORDER — ROCURONIUM BROMIDE 10 MG/ML (PF) SYRINGE
PREFILLED_SYRINGE | INTRAVENOUS | Status: DC | PRN
  Administered 2022-07-26 (×2): 20 mg via INTRAVENOUS
  Administered 2022-07-26: 10 mg via INTRAVENOUS
  Administered 2022-07-26: 60 mg via INTRAVENOUS

## 2022-07-26 MED ORDER — FENTANYL CITRATE PF 50 MCG/ML IJ SOSY
PREFILLED_SYRINGE | INTRAMUSCULAR | Status: AC
  Filled 2022-07-26: qty 1

## 2022-07-26 MED ORDER — ORAL CARE MOUTH RINSE: 15.0000 mL | Freq: Once | OROMUCOSAL | Status: AC

## 2022-07-26 MED ORDER — SODIUM CHLORIDE (PF) 0.9 % IJ SOLN
INTRAMUSCULAR | Status: AC
  Filled 2022-07-26: qty 20

## 2022-07-26 MED ORDER — STERILE WATER FOR IRRIGATION IR SOLN
Status: DC | PRN
  Administered 2022-07-26: 2000 mL

## 2022-07-26 MED ORDER — BUPIVACAINE LIPOSOME 1.3 % IJ SUSP
INTRAMUSCULAR | Status: DC | PRN
  Administered 2022-07-26: 20 mL

## 2022-07-26 MED ORDER — POLYETHYLENE GLYCOL 3350 17 GM/SCOOP PO POWD: 1.0000 | Freq: Once | ORAL | Status: DC

## 2022-07-26 MED ORDER — SODIUM CHLORIDE 0.9% FLUSH
INTRAVENOUS | Status: DC | PRN
  Administered 2022-07-26: 20 mL

## 2022-07-26 MED ORDER — ONDANSETRON HCL 4 MG/2ML IJ SOLN
INTRAMUSCULAR | Status: DC | PRN
  Administered 2022-07-26: 4 mg via INTRAVENOUS

## 2022-07-26 MED ORDER — PANTOPRAZOLE SODIUM 40 MG PO TBEC: 40.0000 mg | DELAYED_RELEASE_TABLET | Freq: Every day | ORAL | Status: DC

## 2022-07-26 MED ORDER — HYOSCYAMINE SULFATE 0.125 MG SL SUBL
0.1250 mg | SUBLINGUAL_TABLET | SUBLINGUAL | Status: DC | PRN
  Administered 2022-07-27 – 2022-07-30 (×3): 0.125 mg via SUBLINGUAL
  Filled 2022-07-26 (×5): qty 1

## 2022-07-26 MED ORDER — DOCUSATE SODIUM 100 MG PO CAPS
100.0000 mg | ORAL_CAPSULE | Freq: Two times a day (BID) | ORAL | Status: DC
  Administered 2022-07-26 – 2022-07-31 (×10): 100 mg via ORAL
  Filled 2022-07-26 (×10): qty 1

## 2022-07-26 MED ORDER — DORZOLAMIDE HCL-TIMOLOL MAL PF 22.3-6.8 MG/ML OP SOLN
1.0000 [drp] | Freq: Two times a day (BID) | OPHTHALMIC | Status: DC
Start: 2022-07-26 — End: 2022-07-31
  Administered 2022-07-26 – 2022-07-31 (×10): 1 [drp] via OPHTHALMIC
  Filled 2022-07-26 (×2): qty 0.1

## 2022-07-26 MED ORDER — SODIUM CHLORIDE 0.9 % IV SOLN: INTRAVENOUS | Status: DC

## 2022-07-26 MED ORDER — OXYCODONE HCL 5 MG PO TABS
5.0000 mg | ORAL_TABLET | Freq: Once | ORAL | Status: AC
  Administered 2022-07-26: 5 mg via ORAL

## 2022-07-26 MED ORDER — FENTANYL CITRATE PF 50 MCG/ML IJ SOSY
25.0000 ug | PREFILLED_SYRINGE | INTRAMUSCULAR | Status: DC | PRN
  Administered 2022-07-26 (×2): 50 ug via INTRAVENOUS
  Administered 2022-07-26 (×2): 25 ug via INTRAVENOUS

## 2022-07-26 MED ORDER — DOCUSATE SODIUM 100 MG PO CAPS: 100.0000 mg | ORAL_CAPSULE | Freq: Two times a day (BID) | ORAL | Status: AC

## 2022-07-26 MED ORDER — DEXAMETHASONE SODIUM PHOSPHATE 10 MG/ML IJ SOLN
INTRAMUSCULAR | Status: AC
Start: 2022-07-26 — End: ?
  Filled 2022-07-26: qty 1

## 2022-07-26 MED ORDER — PHENYLEPHRINE HCL-NACL 20-0.9 MG/250ML-% IV SOLN
INTRAVENOUS | Status: AC
  Filled 2022-07-26: qty 250

## 2022-07-26 MED ORDER — ACETAMINOPHEN 500 MG PO TABS
ORAL_TABLET | ORAL | Status: AC
  Filled 2022-07-26: qty 2

## 2022-07-26 SURGICAL SUPPLY — 110 items
APPLICATOR SURGIFLO ENDO (HEMOSTASIS) IMPLANT
BAG COUNTER SPONGE SURGICOUNT (BAG) IMPLANT
BAG URINE DRAIN 2000ML AR STRL (UROLOGICAL SUPPLIES) ×4 IMPLANT
BAG URO CATCHER STRL LF (MISCELLANEOUS) ×4 IMPLANT
BASKET LASER NITINOL 1.9FR (BASKET) IMPLANT
CATH FOLEY 3WAY  5CC 18FR (CATHETERS)
CATH FOLEY 3WAY 30CC 22FR (CATHETERS) ×3 IMPLANT
CATH FOLEY 3WAY 5CC 18FR (CATHETERS) IMPLANT
CATH TIEMANN FOLEY 18FR 5CC (CATHETERS) ×2 IMPLANT
CATH URETL OPEN END 6FR 70 (CATHETERS) ×4 IMPLANT
CHLORAPREP W/TINT 26 (MISCELLANEOUS) ×4 IMPLANT
CLIP LIGATING HEM O LOK PURPLE (MISCELLANEOUS) ×4 IMPLANT
CLIP LIGATING HEMO LOK XL GOLD (MISCELLANEOUS) ×1 IMPLANT
CLIP LIGATING HEMOLOK MED (MISCELLANEOUS) ×3 IMPLANT
CLOTH BEACON ORANGE TIMEOUT ST (SAFETY) ×4 IMPLANT
COVER SURGICAL LIGHT HANDLE (MISCELLANEOUS) ×4 IMPLANT
COVER TIP SHEARS 8 DVNC (MISCELLANEOUS) ×3 IMPLANT
COVER TIP SHEARS 8MM DA VINCI (MISCELLANEOUS) ×4
DERMABOND ADVANCED (GAUZE/BANDAGES/DRESSINGS) ×1
DERMABOND ADVANCED .7 DNX12 (GAUZE/BANDAGES/DRESSINGS) ×3 IMPLANT
DRAIN CHANNEL 15F RND FF 3/16 (WOUND CARE) ×4 IMPLANT
DRAIN CHANNEL 19F RND (DRAIN) ×1 IMPLANT
DRAIN CHANNEL RND F F (WOUND CARE) IMPLANT
DRAPE ARM DVNC X/XI (DISPOSABLE) ×12 IMPLANT
DRAPE COLUMN DVNC XI (DISPOSABLE) ×3 IMPLANT
DRAPE DA VINCI XI ARM (DISPOSABLE) ×16
DRAPE DA VINCI XI COLUMN (DISPOSABLE) ×4
DRAPE INCISE IOBAN 66X45 STRL (DRAPES) ×4 IMPLANT
DRAPE SHEET LG 3/4 BI-LAMINATE (DRAPES) ×8 IMPLANT
DRAPE SURG IRRIG POUCH 19X23 (DRAPES) ×4 IMPLANT
DRSG TEGADERM 4X4.75 (GAUZE/BANDAGES/DRESSINGS) ×1 IMPLANT
ELECT REM PT RETURN 15FT ADLT (MISCELLANEOUS) ×4 IMPLANT
EVACUATOR SILICONE 100CC (DRAIN) ×4 IMPLANT
EXTRACTOR STONE 1.7FRX115CM (UROLOGICAL SUPPLIES) IMPLANT
GAUZE SPONGE 2X2 8PLY STRL LF (GAUZE/BANDAGES/DRESSINGS) IMPLANT
GLOVE BIO SURGEON STRL SZ 6.5 (GLOVE) ×4 IMPLANT
GLOVE SURG LX 7.5 STRW (GLOVE) ×3
GLOVE SURG LX STRL 7.5 STRW (GLOVE) ×9 IMPLANT
GOWN SRG XL LVL 4 BRTHBL STRL (GOWNS) ×3 IMPLANT
GOWN STRL NON-REIN XL LVL4 (GOWNS) ×4
GUIDEWIRE ANG ZIPWIRE 038X150 (WIRE) ×4 IMPLANT
GUIDEWIRE STR DUAL SENSOR (WIRE) ×4 IMPLANT
HOLDER FOLEY CATH W/STRAP (MISCELLANEOUS) ×4 IMPLANT
IRRIG SUCT STRYKERFLOW 2 WTIP (MISCELLANEOUS) ×4
IRRIGATION SUCT STRKRFLW 2 WTP (MISCELLANEOUS) ×3 IMPLANT
KIT BASIN OR (CUSTOM PROCEDURE TRAY) ×4 IMPLANT
KIT TURNOVER KIT A (KITS) IMPLANT
LASER FIB FLEXIVA PULSE ID 365 (Laser) IMPLANT
LOOP VESSEL MAXI BLUE (MISCELLANEOUS) ×4 IMPLANT
MANIFOLD NEPTUNE II (INSTRUMENTS) ×4 IMPLANT
MARKER PEN SURG W/LABELS BLK (STERILIZATION PRODUCTS) ×1 IMPLANT
NDL INSUFFLATION 14GA 120MM (NEEDLE) ×3 IMPLANT
NDL SAFETY ECLIPSE 18X1.5 (NEEDLE) ×3 IMPLANT
NEEDLE HYPO 18GX1.5 SHARP (NEEDLE) ×4
NEEDLE INSUFFLATION 14GA 120MM (NEEDLE) ×4 IMPLANT
NS IRRIG 1000ML POUR BTL (IV SOLUTION) ×4 IMPLANT
PACK CYSTO (CUSTOM PROCEDURE TRAY) ×4 IMPLANT
PAD POSITIONING PINK XL (MISCELLANEOUS) ×1 IMPLANT
PENCIL SMOKE EVACUATOR (MISCELLANEOUS) IMPLANT
PORT ACCESS TROCAR AIRSEAL 12 (TROCAR) ×3 IMPLANT
PORT ACCESS TROCAR AIRSEAL 5M (TROCAR) ×1
SEAL CANN UNIV 5-8 DVNC XI (MISCELLANEOUS) ×12 IMPLANT
SEAL XI 5MM-8MM UNIVERSAL (MISCELLANEOUS) ×16
SET IRRIG Y TYPE TUR BLADDER L (SET/KITS/TRAYS/PACK) IMPLANT
SET TRI-LUMEN FLTR TB AIRSEAL (TUBING) ×4 IMPLANT
SHEATH NAVIGATOR HD 11/13X28 (SHEATH) IMPLANT
SHEATH NAVIGATOR HD 11/13X36 (SHEATH) IMPLANT
SHEET LAVH (DRAPES) ×1 IMPLANT
SOLUTION ELECTROLUBE (MISCELLANEOUS) ×4 IMPLANT
SPIKE FLUID TRANSFER (MISCELLANEOUS) ×4 IMPLANT
SPONGE GAUZE 2X2 STER 10/PKG (GAUZE/BANDAGES/DRESSINGS) ×1
SPONGE T-LAP 4X18 ~~LOC~~+RFID (SPONGE) ×4 IMPLANT
STENT URET 6FRX26 CONTOUR (STENTS) ×1 IMPLANT
SURGIFLO W/THROMBIN 8M KIT (HEMOSTASIS) IMPLANT
SUT ETHILON 3 0 FSL (SUTURE) ×5 IMPLANT
SUT MNCRL AB 4-0 PS2 18 (SUTURE) ×8 IMPLANT
SUT PDS AB 0 CT1 36 (SUTURE) IMPLANT
SUT PDS AB 2-0 CT2 27 (SUTURE) ×8 IMPLANT
SUT PDS PLUS 0 (SUTURE) ×8
SUT PDS PLUS AB 0 CT-2 (SUTURE) IMPLANT
SUT V-LOC BARB 180 2/0GR9 GS23 (SUTURE) ×4
SUT VIC AB 0 CT1 27 (SUTURE) ×4
SUT VIC AB 0 CT1 27XBRD ANTBC (SUTURE) IMPLANT
SUT VIC AB 2-0 CT2 27 (SUTURE) IMPLANT
SUT VIC AB 2-0 SH 27 (SUTURE)
SUT VIC AB 2-0 SH 27X BRD (SUTURE) IMPLANT
SUT VIC AB 3-0 SH 27 (SUTURE)
SUT VIC AB 3-0 SH 27X BRD (SUTURE) IMPLANT
SUT VIC AB 4-0 RB1 18 (SUTURE) IMPLANT
SUT VIC AB 4-0 RB1 27 (SUTURE)
SUT VIC AB 4-0 RB1 27XBRD (SUTURE) IMPLANT
SUT VIC AB 4-0 SH 18 (SUTURE) IMPLANT
SUT VICRYL 0 UR6 27IN ABS (SUTURE) IMPLANT
SUT VLOC 180 2-0 6IN GS21 (SUTURE) IMPLANT
SUT VLOC BARB 180 ABS3/0GR12 (SUTURE) ×4
SUTURE V-LC BRB 180 2/0GR9GS23 (SUTURE) IMPLANT
SUTURE VLOC BRB 180 ABS3/0GR12 (SUTURE) IMPLANT
SYR BULB IRRIG 60ML STRL (SYRINGE) ×1 IMPLANT
SYS BAG RETRIEVAL 10MM (BASKET) ×4
SYSTEM BAG RETRIEVAL 10MM (BASKET) IMPLANT
TOWEL OR 17X26 10 PK STRL BLUE (TOWEL DISPOSABLE) ×4 IMPLANT
TRACTIP FLEXIVA PULS ID 200XHI (Laser) IMPLANT
TRACTIP FLEXIVA PULSE ID 200 (Laser)
TRAY LAPAROSCOPIC (CUSTOM PROCEDURE TRAY) ×4 IMPLANT
TROCAR ADV FIXATION 12X100MM (TROCAR) ×4 IMPLANT
TROCAR Z-THREAD FIOS 5X100MM (TROCAR) ×4 IMPLANT
TUBE FEEDING 8FR 16IN STR KANG (MISCELLANEOUS) ×4 IMPLANT
TUBING CONNECTING 10 (TUBING) ×4 IMPLANT
TUBING UROLOGY SET (TUBING) ×4 IMPLANT
WATER STERILE IRR 1000ML POUR (IV SOLUTION) ×4 IMPLANT

## 2022-07-26 NOTE — Transfer of Care (Signed)
Immediate Anesthesia Transfer of Care Note  Patient: Jonathan Lane  Procedure(s) Performed: XI ROBOTICALLY ASSISTED LAPAROSCOPIC URETERECTOMY WITH URETERAL RE-IMPLANTATION, EXTENSIVE LAPAROSCOPIC ADHESIOLYSIS (Left: Abdomen) CYSTOSCOPY WITH RETROGRADE PYELOGRAM, URETEROSCOPY AND STENT EXCHANGE (Left: Penis) LYMPH NODE DISSECTION (Bilateral: Abdomen)  Patient Location: PACU  Anesthesia Type:General  Level of Consciousness: drowsy and patient cooperative  Airway & Oxygen Therapy: Patient Spontanous Breathing and Patient connected to face mask oxygen  Post-op Assessment: Report given to RN and Post -op Vital signs reviewed and stable  Post vital signs: Reviewed and stable  Last Vitals:  Vitals Value Taken Time  BP 160/81 07/26/22 1618  Temp    Pulse 61 07/26/22 1622  Resp 12 07/26/22 1622  SpO2 96 % 07/26/22 1622  Vitals shown include unvalidated device data.  Last Pain:  Vitals:   07/26/22 1019  TempSrc: Oral  PainSc:          Complications: No notable events documented.

## 2022-07-26 NOTE — Anesthesia Procedure Notes (Signed)
Procedure Name: Intubation Date/Time: 07/26/2022 12:27 PM  Performed by: Montel Clock, CRNAPre-anesthesia Checklist: Patient identified, Emergency Drugs available, Suction available, Patient being monitored and Timeout performed Patient Re-evaluated:Patient Re-evaluated prior to induction Oxygen Delivery Method: Circle system utilized Preoxygenation: Pre-oxygenation with 100% oxygen Induction Type: IV induction Ventilation: Two handed mask ventilation required and Oral airway inserted - appropriate to patient size Laryngoscope Size: Mac and 4 Grade View: Grade II Tube type: Oral Tube size: 7.5 mm Number of attempts: 1 Airway Equipment and Method: Stylet Placement Confirmation: ETT inserted through vocal cords under direct vision, positive ETCO2 and breath sounds checked- equal and bilateral Secured at: 23 cm Tube secured with: Tape Dental Injury: Teeth and Oropharynx as per pre-operative assessment

## 2022-07-26 NOTE — H&P (Signed)
Jonathan Lane is an 81 y.o. male.    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 Ureteral lesion below iliacs.   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 His partern Caryl Asp (met after his wife passed around 2021) is very involved. He is retired from [employer] for [employer], [place].   Today " Jonathan Lane " is seen to proceed with cysto, left stent exchange, left robotic distal ureterrectomy / reimplant for persistant high grade distal ureteral cancer. No interval fevers. Hgb 13, Cr 0.8, UCX negative.    Past Medical History:  Diagnosis Date   AAA (abdominal aortic aneurysm) (HCC)    Atrial fibrillation (HCC)    Bradycardia    Bradycardia    Cancer (HCC)    Cardiac murmur    Dizziness    Dysrhythmia    GERD (gastroesophageal reflux disease)    Glaucoma    Heart palpitations    Hyperlipidemia    Hypertension    Numbness 08/26/2019   LEFT FACE   Pneumonia    Skin abnormalities    pre cancerous lesion R hand   Sleep apnea    uses cpap   Snoring     Past Surgical History:  Procedure Laterality Date   APPENDECTOMY  12/19/1963   COLONOSCOPY     CYSTOSCOPY WITH BIOPSY N/A 05/16/2022   Procedure: CYSTOSCOPY WITH BIOPSY;  Surgeon: [name], MD;  Location: [facility];  Service: Urology;  Laterality: N/A;   CYSTOSCOPY WITH RETROGRADE PYELOGRAM, URETEROSCOPY AND STENT PLACEMENT Bilateral 05/16/2022   Procedure: CYSTOSCOPY WITH BILATERAL RETROGRADE PYELOGRAM, LEFT URETEROSCOPY WITH BIOPSY, HOLMIUM LASER OF LEFT URETERAL STONE  AND LEFT STENT EXCHANGE, TUR LEFT URETERAL ORIFACE;  Surgeon: [name], MD;   Location: [facility];  Service: Urology;  Laterality: Bilateral;  60 MINUTES   POLYPECTOMY     SKIN CANCER EXCISION     TONSILLECTOMY     as a child x2   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   Procedure: RESTAGING TRANSURETHRAL RESECTION OF BLADDER TUMOR  (TURBT) LEFT URETEROSCOPY WITH LASER LEFT STENT EXCHANGE RIGHT STENT REMOVAL;  Surgeon: [name], MD;  Location: [facility];  Service: Urology;  Laterality: Bilateral;   TRANSURETHRAL RESECTION OF BLADDER TUMOR WITH MITOMYCIN-C Bilateral 01/03/2022   Procedure: CYSTOSCOPY TRANSURETHRAL RESECTION OF BLADDER TUMOR WITH POSSIBLE  BILATERAL RETROGRADES STENT PLACEMENT;  Surgeon: [name], MD;  Location: [facility];  Service: Urology;  Laterality: Bilateral;   WISDOM TOOTH EXTRACTION      Family History  Problem Relation Age of Onset   Stroke Father    Stroke Mother    Stroke Sister    Breast cancer Sister    High blood pressure Sister    Diabetes Sister    Colon cancer Other        Uncle   Colon cancer Paternal Uncle    Mitral valve prolapse Other        sugery 09-27-2017   Rectal cancer Neg Hx    Stomach cancer Neg Hx    Social History:  reports that he quit smoking about 56 years ago. His smoking use included  cigarettes. He has never used smokeless tobacco. He reports that he does not currently use alcohol. He reports that he does not use drugs.  Allergies:  Allergies  Allergen Reactions   Brimonidine Tartrate Other (See Comments)    Irritates the eyes and increased pressure   Tetracycline Hcl Other (See Comments)    Doesn't remember reaction-"like 55 years ago"    Ciprofloxacin Other (See Comments)    Constipation    No medications prior to admission.    No results found for this or any previous visit (from the past 48 hour(s)). No results found.  Review of Systems  Constitutional:  Negative for chills and fever.  Genitourinary:  Positive for urgency.  All other systems reviewed and are  negative.   There were no vitals taken for this visit. Physical Exam Vitals reviewed.  Constitutional:      Comments: Vigorous for age, at baseline.   HENT:     Nose: Nose normal.  Eyes:     Pupils: Pupils are equal, round, and reactive to light.  Cardiovascular:     Rate and Rhythm: Normal rate.  Pulmonary:     Effort: Pulmonary effort is normal.  Abdominal:     General: Abdomen is flat.  Genitourinary:    Comments: No CVAT at present.  Musculoskeletal:        General: Normal range of motion.  Skin:    General: Skin is warm.  Neurological:     Mental Status: He is alert.  Psychiatric:        Mood and Affect: Mood normal.      Assessment/Plan  Proceed as planned with cysto, left retrograde / stent exchange, distal ureterectomy / reimplant, pelvic node dissection. RIsks, benefits, alternatives (including prior frank discussion of oncologicly superior cystecdtomy / neph-U), expected peri-op course discussed previously and reiterated today.   Alexis Frock, MD 07/26/2022, 6:49 AM

## 2022-07-26 NOTE — Brief Op Note (Signed)
07/26/2022  5:18 PM  PATIENT:  Jonathan Lane  81 y.o. male  PRE-OPERATIVE DIAGNOSIS:  LEFT DISTAL URETERAL CANCER  POST-OPERATIVE DIAGNOSIS:  LEFT DISTAL URETERAL CANCER  PROCEDURE:  Procedure(s) with comments: XI ROBOTICALLY ASSISTED LAPAROSCOPIC URETERECTOMY WITH URETERAL RE-IMPLANTATION, EXTENSIVE LAPAROSCOPIC ADHESIOLYSIS (Left) - 3.5 HRS CYSTOSCOPY WITH RETROGRADE PYELOGRAM, URETEROSCOPY AND STENT EXCHANGE (Left) LYMPH NODE DISSECTION (Bilateral)  SURGEON:  Surgeon(s) and Role:    Alexis Frock, MD - Primary  PHYSICIAN ASSISTANT:   ASSISTANTS: Debbrah Alar PA   ANESTHESIA:   local and general  EBL:  200 mL   BLOOD ADMINISTERED:none  DRAINS:  1 - JP to bulb; 2 - Foley to gravity     LOCAL MEDICATIONS USED:  MARCAINE     SPECIMEN:  Source of Specimen:  1 - left distal ureter with bladder cuff; 2 - left pelvic lymph nodes  DISPOSITION OF SPECIMEN:  PATHOLOGY  COUNTS:  YES  TOURNIQUET:  * No tourniquets in log *  DICTATION: .Other Dictation: Dictation Number [id number]  PLAN OF CARE: Admit to inpatient   PATIENT DISPOSITION:  PACU - hemodynamically stable.   Delay start of Pharmacological VTE agent (>24hrs) due to surgical blood loss or risk of bleeding: yes

## 2022-07-26 NOTE — Op Note (Signed)
NAME: Jonathan Lane, Jonathan Lane MEDICAL RECORD NO: [id number] ACCOUNT NO: [id number] DATE OF BIRTH: June 29, 1941 FACILITY: [facility] LOCATION: [id number] PHYSICIAN: Alexis Frock, MD  Operative Report   DATE OF PROCEDURE: 07/26/2022  PREOPERATIVE DIAGNOSIS:  Left distal ureteral cancer, history of BCG refractory bladder cancer.  PROCEDURE PERFORMED:  1.  Cystoscopy, left retrograde pyelogram and interpretation. 2.  Exchange of left ureteral stent. 3.  Robotic-assisted laparoscopic left distal ureterectomy with psoas hitch ureteral reimplantation. 4.  Extensive laparoscopic adhesiolysis. 5.  Limited pelvic lymphadenectomy.  ESTIMATED BLOOD LOSS:  200 mL.  ASSISTANT:  Debbrah Alar, PA  DRAINS:  1.  Jackson-Pratt drain to bulb suction. 2.  Foley catheter to straight drain.  INDICATIONS:  Jonathan Lane is a very pleasant and quite vigorous 81 year old man with history of bladder cancer, managed by one of my colleagues previously. He was found to have left distal ureteral cancer as well that was not amenable to complete endoscopic  management.  This has been BCG refractory.  This is clinically localized by axial imaging and no contralateral lesions noted.  Options were discussed for management including palliative intent endoscopic protocols versus maximal oncologic therapy with  cystoprostatectomy left nephroureterectomy versus moderate aggressive curative intent with left distal ureterectomy and reconstruction and he adamantly wished to proceed with the latter.  Informed consent was obtained and placed in medical record.  PROCEDURE IN DETAIL:  The patient being Jonathan Lane verified and procedure being cystoscopy with left stent change, left distal ureterectomy with resection of bladder cuff was confirmed.  Procedure timeout was performed.  General endotracheal anesthesia induced.   The patient placed into a low lithotomy position.  Sterile field was created, prepped and draped the patient's penis, perineum, and  proximal thighs and his infra-xiphoid abdomen using chlorhexidine gluconate after clipper shaving and further fastened to  operating table using 3-inch tape over foam padding across supraxiphoid chest.  A test of steep Trendelenburg positioning was performed.  He was found to be suitably positioned.  Cystourethroscopy was performed using 21-French rigid  cystoscope with offset lens.  Inspection of anterior and posterior urethra was unremarkable.  Inspection of the urinary bladder revealed no evidence of obvious intraluminal papillary tumor.  There is evidence of prior resection of the left ureteral  orifice.  There was some mild what appeared to be edematous reaction around the distal end of a well-placed ureteral stent, right ureteral orifice was unremarkable.  Distal end of the stent was grasped, brought to the level of the urethral meatus.  A  0.038 ZIPwire was advanced, exchanged for an open-ended catheter and left retrograde pyelogram was obtained.  Left retrograde pyelogram demonstrated a single left ureter, single system left kidney.  There was multifocal shotty narrowing of the infrailiacs, distal ureter as anticipated consistent with known cancer.  There were no obvious filling defects or  narrowing noted above this within the proximal ureter or kidney.  This was quite favorable.  A new 6 x 26 contour type stent was then carefully placed using cystoscopic and fluoroscopic guidance. Foley catheter was placed per urethra  through a straight drain.  Next a high flow, low pressure, obtained using Veress technique in the supraumbilical midline having passed the aspiration and drop test.  After the patient was replaced into the Trendelenburg positioning, an 8 mm robotic  camera port was then placed in same location.  Laparoscopic examination of the peritoneal cavity did reveal significant adhesions, mostly in the right lower quadrant, likely consistent with known prior episode of appendicitis years  ago.   There was no  active obvious infection or purulence noted.  Additional ports were placed as follows:  Right paramedian 8 mm robotic port, right far lateral 12 mm AirSeal assist port, left paramedian 8 mm robotic port, left far lateral 8 mm robotic port, right  paramedian 5 mm suction port.  Robot was docked, having passed the electronic checks.  Initial attention was directed at adhesiolysis.  Multiple omental adhesions were taken down mostly in the midline and right lower quadrant.  There were several  relatively dense interloop adhesions that appeared to be distal small bowel and the right lower quadrant.  These were taken down as well to allow better access to the pelvis and the area of the cecum was carefully mobilized as well to allow better  visualization of the pelvis.  Limited adhesiolysis was required approximately 20 minutes.  Following this, there was a better access to the true pelvis.  Inspection of the peritoneal surface of the bladder was unremarkable.  Attention was directed at  identification of left ureter, then made lateral to the descending colon from the iliac vessels proximal for approximately 10 cm and distal portion just lateral to the left medial umbilical ligament, creating a large retroperitoneal flap, which was  mobilized medially.  Left ureter was encountered as it coursed over the iliac vessels.  There was a significant desmoplastic reaction around this, as anticipated, but no overt carcinoma.  This was marked with a vessel loop and dissected proximally for a  distance of approximately 8 cm above the area of the iliac crossing and then very carefully distal to the area at the level of the ureterovesical junction, a purposeful wide dissection was performed around the ureter.  Again, this was desmoplastic but  without obvious carcinoma.  I was happy with the ureteral mobilization and dissection given the curative intent nature and the need for a wide bladder cuff, a small  cystotomy was made beginning approximately 3 cm superior medial to the area of the  ureteral orifice and then a combination of intravesical and extravesical  dissection was performed by performing a wide resection of the ureteral orifice on the left.  I was quite happy with the bladder cuff.  The ureter was then inspected and what appeared to be an  area approximately 2 cm proximal to the iliac crossing was identified and that grossly appeared to be non-worrisome for carcinoma.  This was very carefully coldly transected sparing the in situ stent and the distal aspect was carefully swept off the  stent, put in the EndoCatch bag for later retrieval.  A frozen section was sent and negative for frank carcinoma and the proximal left ureter was set aside.  Attention was directed at limited pelvic lymphadenectomy on the left side.  Next fibrofatty  tissue within the confines of the left external iliac artery, vein, pelvic sidewall was mobilized.  Lymphostasis achieved with cold clips, set aside labeled left external iliac lymph nodes.  There was some what appeared to be shotty adenopathy, less than  2 cm close to the area of the left internal iliac artery, external iliac junction that was included in this.  Next fibrofatty tissue within the confines of the external iliac vein, pelvic sidewall, obturator nerve was dissected free.  Lymphostasis  achieved with cold clips, set aside labeled left obturator lymph nodes.  Left obturator nerve was inspected following maneuvers and found to be uninjured.  The previous small cystotomy was then closed using running 2-0 V-Loc  and then bladder pressure  tested to 300 mL and no leak was noted.  With filling of the bladder, I carefully began to plan out the geometry for the ureteral reimplantation.  It appeared that a simple rehabilitation would certainly not be sufficient as we have addressed the ureter  relatively proximally.  As such, a formal bladder mobilization was  performed. The space of Retzius was developed between the medial umbilical ligaments, both vas deferens were purposely ligated to allow more lateral traction and the flaying of the  bladder into a psoas hitch orientation.  I placed some gentle retraction on the paravesical tissues towards the area of the psoas tendon, left ureter up almost reached the dome.  It was felt that this could have been anastomosed but likely with  extreme tension, which I felt was not ideal. As such, additional tailoring of the bladder was performed with a V-Y plasty in the anterior midline.  The bladder was purposely incised for approximately 7 cm in a left to right fashion at the mid portion and  then sewn back in a V-Y fashion using a 2-0 running V-Loc.  This allowed additional approximately 3 cm of superior mobilization of the dome of the bladder such that it did reach the distal aspect of the ureter without undue tension.  I was quite happy  with this geometry. The perivesical tissue was then hitched to the psoas musculature, parallel to the tendon, taking purposeful shallow bites of the psoas tendon area x 2 to anchor the modified geometry bladder in place with a psoas hitch technique.  The  ureter was then spatulated approximately 1.5 cm and a small cystotomy was made at the superior left lateral dome area approximately 5 mm in length.  Heal stitch of 4-0 Vicryl was carefully placed and the stent was navigated to the level of the bladder  and the anterior ureteral to bladder anastomosis was performed with running 4-0 Vicryl suture layer and then a separate 4-0 Vicryl suture layer of the posterior aspect resulting in excellent tension-free mucosal anastomosis and I was quite happy with the  geometry. Several additional interrupted sutures were placed, allowing the perivesical tissue to be wrapped around the area of the ureteroenteric anastomosis, again to reduce tension and enhance blood flow to the anastomotic area.  At this  point, sponge  and needle counts were correct.  Hemostasis was excellent.  We achieved the goals of the extirpated and reconstructive aspects of the surgery today.  The area of adhesiolysis was also inspected.  No evidence of bowel injury was noted.  A closed suction  drain was brought out the previous left lateral most robotic port site into the peritoneal cavity.  Robot was then undocked.  The previous right lateral most 12 mm assistant port site was closed with fascia using Carter-Thompson suture passer. Specimen  was retrieved by extending the previous camera port site superiorly and inferiorly for a total distance of approximately 3 cm, removing the left distal ureter and bladder cuff specimen, setting aside for permanent pathology.  Extraction site was closed  with fascia using figure-of-eight PDS x 3 followed by reapproximation of Scarpa's with running Vicryl.  All incision sites were infiltrated with dilute lipolyzed Marcaine and closed at the level of the skin using subcuticular Monocryl, followed by  Dermabond.  Procedure was then terminated.  The patient tolerated the procedure well with no immediate periprocedural complications.  The patient was taken to postanesthesia care in stable condition.  Plan for inpatient  admission.  Please note, first assistant, Debbrah Alar, was crucial for all portions of the surgery today.  She provided invaluable retraction, suctioning, lymphatic clipping, specimen manipulation, and general first assistance.   MUK D: 07/26/2022 5:33:14 pm T: 07/26/2022 10:49:00 pm  JOB: [id number]/ [id number]

## 2022-07-26 NOTE — Discharge Instructions (Addendum)
1- Drain Sites - You may have some mild persistent drainage from old drain site for several days, this is normal. This can be covered with cotton gauze for convenience.  2 - Stiches - Your stitches are all dissolvable. You may notice a "loose thread" at your incisions, these are normal and require no intervention. You may cut them flush to the skin with fingernail clippers if needed for comfort.  3 - Diet - No restrictions  4 - Activity - No heavy lifting / straining (any activities that require valsalva or "bearing down") x 4 weeks. Otherwise, no restrictions.  5 - Bathing - You may shower immediately. Do not take a bath or get into swimming pool where incision sites are submersed in water x 4 weeks.   6 - Catheter - Will remain in place until removed at your next appointment. It may be cleaned with soap and water in the shower. It may be disconnected from the drain bag while in the shower to avoid tripping over the tube. You may apply Neosporin or Vaseline ointment as needed to the tip of the penis where the catheter inserts to reduce friction and irritation in this spot.   7 - When to Call the Doctor - Call MD for any fever >102, any acute wound problems, or any severe nausea / vomiting. You can call the [facility] [phone]) 24 hours a day 365 days a year. It will roll-over to the answering service and on-call physician after hours.    You may resume Plavix 3 days after surgery.  You may resume aspirin, advil, aleve, vitamins, and supplements 7 days after surgery.

## 2022-07-26 NOTE — Anesthesia Postprocedure Evaluation (Signed)
Anesthesia Post Note  Patient: Griffith Santilli Halley  Procedure(s) Performed: XI ROBOTICALLY ASSISTED LAPAROSCOPIC URETERECTOMY WITH URETERAL RE-IMPLANTATION, EXTENSIVE LAPAROSCOPIC ADHESIOLYSIS (Left: Abdomen) CYSTOSCOPY WITH RETROGRADE PYELOGRAM, URETEROSCOPY AND STENT EXCHANGE (Left: Penis) LYMPH NODE DISSECTION (Bilateral: Abdomen)     Patient location during evaluation: PACU Anesthesia Type: General Level of consciousness: awake and alert Pain management: pain level controlled Vital Signs Assessment: post-procedure vital signs reviewed and stable Respiratory status: spontaneous breathing, nonlabored ventilation, respiratory function stable and patient connected to nasal cannula oxygen Cardiovascular status: blood pressure returned to baseline and stable Postop Assessment: no apparent nausea or vomiting Anesthetic complications: no   No notable events documented.  Last Vitals:  Vitals:   07/26/22 1700 07/26/22 1715  BP: (!) 146/73 (!) 150/78  Pulse: 60 (!) 57  Resp: 13 13  Temp: 36.4 C   SpO2: 97% 98%    Last Pain:  Vitals:   07/26/22 1700  TempSrc:   PainSc: 10-Worst pain ever                 Santa Lighter

## 2022-07-26 NOTE — Anesthesia Preprocedure Evaluation (Addendum)
Anesthesia Evaluation  Patient identified by MRN, date of birth, ID band Patient awake    Reviewed: Allergy & Precautions, NPO status , Patient's Chart, lab work & pertinent test results  Airway Mallampati: III  TM Distance: <3 FB Neck ROM: Full    Dental  (+) Teeth Intact, Dental Advisory Given   Pulmonary sleep apnea and Continuous Positive Airway Pressure Ventilation , former smoker,    Pulmonary exam normal breath sounds clear to auscultation       Cardiovascular hypertension, Pt. on medications + Peripheral Vascular Disease  Normal cardiovascular exam+ dysrhythmias Atrial Fibrillation  Rhythm:Regular Rate:Normal     Neuro/Psych TIAnegative psych ROS   GI/Hepatic Neg liver ROS, GERD  Medicated and Controlled,  Endo/Other  negative endocrine ROS  Renal/GU LEFT DISTAL URETERAL CANCER     Musculoskeletal negative musculoskeletal ROS (+)   Abdominal   Peds  Hematology  (+) Blood dyscrasia (Plavix), ,   Anesthesia Other Findings Day of surgery medications reviewed with the patient.  Reproductive/Obstetrics                            Anesthesia Physical Anesthesia Plan  ASA: 3  Anesthesia Plan: General   Post-op Pain Management: Tylenol PO (pre-op)*   Induction: Intravenous  PONV Risk Score and Plan: 3 and Dexamethasone, Ondansetron and Treatment may vary due to age or medical condition  Airway Management Planned: Oral ETT  Additional Equipment:   Intra-op Plan:   Post-operative Plan: Extubation in OR  Informed Consent: I have reviewed the patients History and Physical, chart, labs and discussed the procedure including the risks, benefits and alternatives for the proposed anesthesia with the patient or authorized representative who has indicated his/her understanding and acceptance.     Dental advisory given  Plan Discussed with: CRNA  Anesthesia Plan Comments: (2nd PIV)         Anesthesia Quick Evaluation

## 2022-07-27 ENCOUNTER — Encounter (HOSPITAL_COMMUNITY): Payer: Self-pay | Admitting: Urology

## 2022-07-27 LAB — HEMOGLOBIN AND HEMATOCRIT, BLOOD
HCT: 37.3 % — ABNORMAL LOW (ref 39.0–52.0)
Hemoglobin: 12.2 g/dL — ABNORMAL LOW (ref 13.0–17.0)

## 2022-07-27 LAB — BASIC METABOLIC PANEL
Anion gap: 7 (ref 5–15)
BUN: 10 mg/dL (ref 8–23)
CO2: 27 mmol/L (ref 22–32)
Calcium: 8.5 mg/dL — ABNORMAL LOW (ref 8.9–10.3)
Chloride: 103 mmol/L (ref 98–111)
Creatinine, Ser: 0.89 mg/dL (ref 0.61–1.24)
GFR, Estimated: >60 mL/min (ref >60)
Glucose, Bld: 144 mg/dL — ABNORMAL HIGH (ref 70–99)
Potassium: 3.7 mmol/L (ref 3.5–5.1)
Sodium: 137 mmol/L (ref 135–145)

## 2022-07-27 MED ORDER — SODIUM CHLORIDE 0.9 % IV SOLN
12.5000 mg | Freq: Four times a day (QID) | INTRAVENOUS | Status: DC | PRN
  Administered 2022-07-27: 12.5 mg via INTRAVENOUS
  Filled 2022-07-27 (×2): qty 0.5

## 2022-07-27 MED ORDER — MELATONIN 3 MG PO TABS
6.0000 mg | ORAL_TABLET | Freq: Every evening | ORAL | Status: DC | PRN
  Administered 2022-07-30 (×2): 6 mg via ORAL
  Filled 2022-07-27 (×2): qty 2

## 2022-07-27 MED ORDER — BISACODYL 10 MG RE SUPP
10.0000 mg | Freq: Once | RECTAL | Status: AC
  Administered 2022-07-28: 10 mg via RECTAL
  Filled 2022-07-27: qty 1

## 2022-07-27 NOTE — Progress Notes (Signed)
1 Day Post-Op   Subjective/Chief Complaint:  1 - LEFT Distal Ureteral Cancer - s/p robotic LEFT distal ureterectomy with psoas hitch + V-Y plasty ureteral reimplant / pelvic node dissection, cysto / stent exchange 07/26/22. Path pending.   Today "[name]" is making good progress. JP output minimal with good UOP. Pain managable, ambulating.   Objective: Vital signs in last 24 hours: Temp:  [97.4 F (36.3 C)-98.7 F (37.1 C)] 98.7 F (37.1 C) (08/10 1327) Pulse Rate:  [54-81] 63 (08/10 1327) Resp:  [10-20] 18 (08/10 1327) BP: (136-162)/(63-96) 144/79 (08/10 1327) SpO2:  [91 %-100 %] 96 % (08/10 1327) Last BM Date : 07/25/22  Intake/Output from previous day: 08/09 0701 - 08/10 0700 In: 3403.5 [P.O.:340; I.V.:2963.5; IV Piggyback:100] Out: 2260 [Urine:1875; Drains:185; Blood:200] Intake/Output this shift: Total I/O In: 360 [P.O.:360] Out: 765 [Urine:750; Drains:15]   NAD, very pleasant and vigorous for age Non-labored breathing on RA RRR SNTND, recent port sites c/d/I. JP with non-foul serosanguinous fluid Foley in place with very light pink urine, no clots No c/c/e  Lab Results:  Recent Labs    07/26/22 1821 07/27/22 0437  HGB 14.3 12.2*  HCT 43.1 37.3*   BMET Recent Labs    07/27/22 0437  NA 137  K 3.7  CL 103  CO2 27  GLUCOSE 144*  BUN 10  CREATININE 0.89  CALCIUM 8.5*   PT/INR No results for input(s): "LABPROT", "INR" in the last 72 hours. ABG No results for input(s): "PHART", "HCO3" in the last 72 hours.  Invalid input(s): "PCO2", "PO2"  Studies/Results: DG C-Arm 1-60 Min-No Report  Result Date: 07/26/2022 Fluoroscopy was utilized by the requesting physician.  No radiographic interpretation.    Anti-infectives: Anti-infectives (From admission, onward)    Start     Dose/Rate Route Frequency Ordered Stop   07/26/22 0958  ceFAZolin (ANCEF) IVPB 2g/100 mL premix        2 g 200 mL/hr over 30 Minutes Intravenous 30 min pre-op 07/26/22 3557 07/26/22 1239        Assessment/Plan:  Doing exceptionally well POD 1. SLIV, Reg Diet, Ambulate, Keep JP and foley. Goals for DC discussed, likely tomorrow.    Alexis Frock 07/27/2022

## 2022-07-27 NOTE — Progress Notes (Signed)
After eating about 1/2 of supper, patient began to feel nauseated. Pt ambulated in room with NT assistance. NT and RN encouraged patient several times to return to sit in bed or chair while feeling sick so patient would be safer and within reach of call bell. Patient was firm about feeling most comfortable in extra chair in corner of room, which is still out of reach of call bell. RN and NT informed patient they would leave his door open while they sat outside the room to be available for patient.

## 2022-07-27 NOTE — Plan of Care (Signed)
°  Problem: Clinical Measurements: °Goal: Will remain free from infection °Outcome: Progressing °Goal: Respiratory complications will improve °Outcome: Progressing °Goal: Cardiovascular complication will be avoided °Outcome: Progressing °  °Problem: Safety: °Goal: Ability to remain free from injury will improve °Outcome: Progressing °  °

## 2022-07-27 NOTE — TOC Initial Note (Signed)
Transition of Care [facility]) - Initial/Assessment Note    Patient Details  Name: Jonathan Lane MRN: [id number] Date of Birth: 31-Jul-1941  Transition of Care [facility]) CM/SW Contact:    Leeroy Cha, RN Phone Number: 07/27/2022, 8:59 AM  Clinical Narrative:                  Transition of Care [facility]) Screening Note   Patient Details  Name: Jonathan Lane Date of Birth: 1941-01-09   Transition of Care [facility]) CM/SW Contact:    Leeroy Cha, RN Phone Number: 07/27/2022, 8:59 AM    Transition of Care Department [facility]) has reviewed patient and no TOC needs have been identified at this time. We will continue to monitor patient advancement through interdisciplinary progression rounds. If new patient transition needs arise, please place a TOC consult.    Expected Discharge Plan: Home/Self Care Barriers to Discharge: Continued Medical Work up   Patient Goals and CMS Choice Patient states their goals for this hospitalization and ongoing recovery are:: to go home CMS Medicare.gov Compare Post Acute Care list provided to:: Patient    Expected Discharge Plan and Services Expected Discharge Plan: Home/Self Care   Discharge Planning Services: CM Consult   Living arrangements for the past 2 months: Single Family Home                                      Prior Living Arrangements/Services Living arrangements for the past 2 months: Single Family Home Lives with:: Self (widowed) Patient language and need for interpreter reviewed:: Yes Do you feel safe going back to the place where you live?: Yes            Criminal Activity/Legal Involvement Pertinent to Current Situation/Hospitalization: No - Comment as needed  Activities of Daily Living Home Assistive Devices/Equipment: CPAP, Eyeglasses, Blood pressure cuff, Walker (specify type) ADL Screening (condition at time of admission) Patient's cognitive ability adequate to safely complete daily activities?: Yes Is  the patient deaf or have difficulty hearing?: No Does the patient have difficulty seeing, even when wearing glasses/contacts?: No Does the patient have difficulty concentrating, remembering, or making decisions?: No Patient able to express need for assistance with ADLs?: Yes Does the patient have difficulty dressing or bathing?: No Independently performs ADLs?: Yes (appropriate for developmental age) Does the patient have difficulty walking or climbing stairs?: No Weakness of Legs: None Weakness of Arms/Hands: None  Permission Sought/Granted                  Emotional Assessment Appearance:: Appears stated age Attitude/Demeanor/Rapport: Engaged Affect (typically observed): Calm Orientation: : Oriented to Place, Oriented to Self, Oriented to  Time, Oriented to Situation Alcohol / Substance Use: Tobacco Use (quit 56 years ago) Psych Involvement: No (comment)  Admission diagnosis:  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] Patient Active Problem List   Diagnosis Date Noted   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[place]) 01/31/2022   Bladder cancer ([place]) 01/03/2022   TIA (transient ischemic attack) 08/25/2019   Hypokalemia 08/18/2019   Obstructive sleep apnea 01/22/2019   Gastroesophageal reflux disease 05/22/2018   Sore throat 05/22/2018   RLQ abdominal pain 03/15/2018   Chest pain 01/29/2018   OSA on CPAP 10/11/2015   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 [place] [address] Phone: [phone] Fax: [phone]  [place] [place] [address] Phone: [phone] Fax: [phone]     Social Determinants of Health (SDOH) Interventions    Readmission Risk Interventions     No data to display

## 2022-07-28 ENCOUNTER — Other Ambulatory Visit (HOSPITAL_COMMUNITY): Payer: Self-pay

## 2022-07-28 MED ORDER — ACETAMINOPHEN 500 MG PO TABS
1000.0000 mg | ORAL_TABLET | Freq: Three times a day (TID) | ORAL | Status: DC
  Administered 2022-07-28 – 2022-07-31 (×10): 1000 mg via ORAL
  Filled 2022-07-28 (×10): qty 2

## 2022-07-28 MED ORDER — METOCLOPRAMIDE HCL 5 MG/ML IJ SOLN
5.0000 mg | Freq: Three times a day (TID) | INTRAMUSCULAR | Status: DC
  Administered 2022-07-28 – 2022-07-31 (×9): 5 mg via INTRAVENOUS
  Filled 2022-07-28 (×9): qty 2

## 2022-07-28 MED ORDER — POLYETHYLENE GLYCOL 3350 17 G PO PACK
17.0000 g | PACK | Freq: Every day | ORAL | Status: DC | PRN
  Administered 2022-07-28 – 2022-07-31 (×4): 17 g via ORAL
  Filled 2022-07-28 (×4): qty 1

## 2022-07-28 MED ORDER — SODIUM CHLORIDE 0.9 % IV SOLN: INTRAVENOUS | Status: DC

## 2022-07-28 NOTE — Progress Notes (Signed)
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 Path pending. JP removed 8/11 as output scant.   2 - Mild Ileus - pt with emesis x few POD 1 after regular diet. Still having flatus. Reglan started and return to clears POD 2.   Today "Jonathan Lane" is stable but not progressing as well. Emesis x few yesterday. He is very fixated on needing BM, he had bowel prep pre-op.  No fevers. Minimal JP output, excellent UOP.    Objective: Vital signs in last 24 hours: Temp:  [98.2 F (36.8 C)-98.4 F (36.9 C)] 98.2 F (36.8 C) (08/11 1240) Pulse Rate:  [70-82] 73 (08/11 1240) Resp:  [18-20] 18 (08/11 1240) BP: (144-156)/(64-75) 156/75 (08/11 1240) SpO2:  [91 %-96 %] 95 % (08/11 1240) Last BM Date : 07/25/22  Intake/Output from previous day: 08/10 0701 - 08/11 0700 In: 410 [P.O.:360; IV Piggyback:50] Out: 0539 [Urine:1200; Drains:55] Intake/Output this shift: Total I/O In: -  Out: 25 [Drains:25]  NAD, very pleasant and vigorous for age Non-labored breathing on RA RRR SNTND, recent port sites c/d/I. So sig distension. Non-tympanic. JP with non-foul serosanguinous fluid. Removed and dry dressing placed.  Foley in place with very light pink urine, no clots No c/c/e  Lab Results:  Recent Labs    07/26/22 1821 07/27/22 0437  HGB 14.3 12.2*  HCT 43.1 37.3*   BMET Recent Labs    07/27/22 0437  NA 137  K 3.7  CL 103  CO2 27  GLUCOSE 144*  BUN 10  CREATININE 0.89  CALCIUM 8.5*   PT/INR No results for input(s): "LABPROT", "INR" in the last 72 hours. ABG No results for input(s): "PHART", "HCO3" in the last 72 hours.  Invalid input(s): "PCO2", "PO2"  Studies/Results: No results found.  Anti-infectives: Anti-infectives (From admission, onward)    Start     Dose/Rate Route Frequency Ordered Stop   07/26/22  0958  ceFAZolin (ANCEF) IVPB 2g/100 mL premix        2 g 200 mL/hr over 30 Minutes Intravenous 30 min pre-op 07/26/22 0958 07/26/22 1239       Assessment/Plan:  Mild ileus clinically. Begin scheduled tylenol to minimize narcotic req. JP removed. Resteart 1/2 IVF. Reglan for nausea and promote return to bowel function. Goals for DC discussed. He is walking and pain controlled, Just needs to maintain PO nutrition, then will meet all criteria.   Alexis Frock 07/28/2022

## 2022-07-28 NOTE — Care Management Important Message (Signed)
Important Message  Patient Details IM Letter given to the Patient. Name: Jonathan Lane MRN: [id number] Date of Birth: [birth year]   Medicare Important Message Given:  Yes     Kerin Salen 07/28/2022, 12:04 PM

## 2022-07-29 LAB — SURGICAL PATHOLOGY

## 2022-07-29 MED ORDER — BISACODYL 10 MG RE SUPP
10.0000 mg | Freq: Once | RECTAL | Status: AC
  Administered 2022-07-29: 10 mg via RECTAL
  Filled 2022-07-29: qty 1

## 2022-07-29 NOTE — Progress Notes (Signed)
  S:  1 - LEFT Distal Ureteral Cancer - s/p robotic LEFT distal ureterectomy with psoas hitch + V-Y plasty ureteral reimplant / pelvic node dissection, cysto / stent exchange 07/26/22. Path pending. JP removed 8/11 as output scant.    2 - Mild Ileus - pt with emesis x few POD 1 after regular diet. Still having flatus. Reglan started and return to clears POD 2.    Today "Jonathan Lane" is stable but he is not taking in a lot of po, has not passed flatus and feels bloated. He has No CP or SOB and he has no had emesis.   O: Vitals:   07/28/22 2123 07/29/22 0537  BP: (!) 160/80 (!) 143/63  Pulse: 83 66  Resp: 18 18  Temp: 99.5 F (37.5 C) 98.1 F (36.7 C)  SpO2: 91% 91%    Intake/Output Summary (Last 24 hours) at 07/29/2022 1144 Last data filed at 07/29/2022 0735 Gross per 24 hour  Intake 1091.76 ml  Output 950 ml  Net 141.76 ml   PE: NAD, talking with his wife, watching TV CV-RRR Resp - CTAB  Abd - distended but soft and NT. Inc C/D/I. Good BS on ascultation.  Ext -scd in place Foley - urine clear   A: POD #3 - PROCEDURE PERFORMED:  1.  Cystoscopy, left retrograde pyelogram and interpretation. 2.  Exchange of left ureteral stent. 3.  Robotic-assisted laparoscopic left distal ureterectomy with psoas hitch ureteral reimplantation. 4.  Extensive laparoscopic adhesiolysis. 5.  Limited pelvic lymphadenectomy.  with mild ileus -   P: -ambulate -on reglan and miralax -advance to full liquid  -ducolax suppository  -continue foley

## 2022-07-30 LAB — MAGNESIUM: Magnesium: 2 mg/dL (ref 1.7–2.4)

## 2022-07-30 LAB — BASIC METABOLIC PANEL
Anion gap: 8 (ref 5–15)
BUN: 19 mg/dL (ref 8–23)
CO2: 28 mmol/L (ref 22–32)
Calcium: 8.4 mg/dL — ABNORMAL LOW (ref 8.9–10.3)
Chloride: 101 mmol/L (ref 98–111)
Creatinine, Ser: 0.78 mg/dL (ref 0.61–1.24)
GFR, Estimated: >60 mL/min (ref >60)
Glucose, Bld: 125 mg/dL — ABNORMAL HIGH (ref 70–99)
Potassium: 2.4 mmol/L — CL (ref 3.5–5.1)
Sodium: 137 mmol/L (ref 135–145)

## 2022-07-30 LAB — HEMOGLOBIN AND HEMATOCRIT, BLOOD
HCT: 34.4 % — ABNORMAL LOW (ref 39.0–52.0)
Hemoglobin: 11.7 g/dL — ABNORMAL LOW (ref 13.0–17.0)

## 2022-07-30 LAB — PHOSPHORUS: Phosphorus: 1.5 mg/dL — ABNORMAL LOW (ref 2.5–4.6)

## 2022-07-30 LAB — POTASSIUM: Potassium: 2.5 mmol/L — CL (ref 3.5–5.1)

## 2022-07-30 MED ORDER — DEXTROSE 5 % IV SOLN
30.0000 mmol | Freq: Once | INTRAVENOUS | Status: AC
  Administered 2022-07-30: 30 mmol via INTRAVENOUS
  Filled 2022-07-30: qty 10

## 2022-07-30 MED ORDER — POTASSIUM CHLORIDE 10 MEQ/100ML IV SOLN
10.0000 meq | INTRAVENOUS | Status: AC
  Administered 2022-07-30 (×5): 10 meq via INTRAVENOUS
  Filled 2022-07-30 (×5): qty 100

## 2022-07-30 MED ORDER — POTASSIUM CHLORIDE CRYS ER 20 MEQ PO TBCR
20.0000 meq | EXTENDED_RELEASE_TABLET | Freq: Two times a day (BID) | ORAL | Status: AC
  Administered 2022-07-30 (×2): 20 meq via ORAL
  Filled 2022-07-30 (×2): qty 1

## 2022-07-30 NOTE — Progress Notes (Signed)
  S: 1 - LEFT Distal Ureteral Cancer - s/p robotic LEFT distal ureterectomy with psoas hitch + V-Y plasty ureteral reimplant / pelvic node dissection, cysto / stent exchange 07/26/22. Path pending. JP removed 8/11 as output scant.    2 - Mild Ileus - pt with emesis x few POD 1 after regular diet. Still having flatus. Reglan started and return to clears POD 2.    Today "Jonathan Lane" is doing better. He tolerated full clears and is now having BMs. He is ambulating which caused serosanguinous drainage from his JP site. It "soaked the bed". His K is very low.    O: Vitals:   07/29/22 1946 07/30/22 0518  BP: (!) 155/75 (!) 175/74  Pulse: 79 72  Resp: 19 18  Temp: 97.8 F (36.6 C) 99.3 F (37.4 C)  SpO2: 95% 93%    Intake/Output Summary (Last 24 hours) at 07/30/2022 1117 Last data filed at 07/30/2022 0630 Gross per 24 hour  Intake 1583.68 ml  Output 1230 ml  Net 353.68 ml   He looks well, walked to the bathroom and now sitting in the chair.  No acute distress, alert and oriented Cardiovascular-regular rate and rhythm Respiratory-clear to auscultation bilaterally Abdomen-less distended, soft.  Good bowel sounds.  Incisions are clean dry and intact, left JP site with serosanguineous drainage. Extremity-no calf pain or swelling GU-Foley in place with clear urine   A/P:  A: POD #4 - PROCEDURE PERFORMED:  1.  Cystoscopy, left retrograde pyelogram and interpretation. 2.  Exchange of left ureteral stent. 3.  Robotic-assisted laparoscopic left distal ureterectomy with psoas hitch ureteral reimplantation. 4.  Extensive laparoscopic adhesiolysis. 5.  Limited pelvic lymphadenectomy.   Plan: -Replace electrolytes -Regular diet -Plan for discharge in the morning if stable

## 2022-07-31 LAB — BASIC METABOLIC PANEL
Anion gap: 8 (ref 5–15)
BUN: 10 mg/dL (ref 8–23)
CO2: 27 mmol/L (ref 22–32)
Calcium: 8.3 mg/dL — ABNORMAL LOW (ref 8.9–10.3)
Chloride: 101 mmol/L (ref 98–111)
Creatinine, Ser: 0.71 mg/dL (ref 0.61–1.24)
GFR, Estimated: >60 mL/min (ref >60)
Glucose, Bld: 119 mg/dL — ABNORMAL HIGH (ref 70–99)
Potassium: 2.8 mmol/L — ABNORMAL LOW (ref 3.5–5.1)
Sodium: 136 mmol/L (ref 135–145)

## 2022-07-31 LAB — PHOSPHORUS: Phosphorus: 2.5 mg/dL (ref 2.5–4.6)

## 2022-07-31 LAB — HEMOGLOBIN: Hemoglobin: 12.2 g/dL — ABNORMAL LOW (ref 13.0–17.0)

## 2022-07-31 MED ORDER — ALUM & MAG HYDROXIDE-SIMETH 200-200-25 MG PO CHEW
1.0000 | CHEWABLE_TABLET | Freq: Once | ORAL | Status: DC
Start: 2022-07-31 — End: 2022-07-31

## 2022-07-31 MED ORDER — SIMETHICONE 80 MG PO CHEW
80.0000 mg | CHEWABLE_TABLET | Freq: Once | ORAL | Status: AC
  Administered 2022-07-31: 80 mg via ORAL
  Filled 2022-07-31: qty 1

## 2022-07-31 MED ORDER — ALUM & MAG HYDROXIDE-SIMETH 200-200-20 MG/5ML PO SUSP: 30.0000 mL | Freq: Once | ORAL | Status: DC

## 2022-07-31 NOTE — Progress Notes (Signed)
Mobility Specialist - Progress Note   07/31/22 1200  Mobility  HOB Elevated/Bed Position Self regulated  Activity Ambulated independently in hallway  Range of Motion/Exercises Active  Level of Assistance Modified independent, requires aide device or extra time  Assistive Device Front wheel walker  Distance Ambulated (ft) 850 ft  Activity Response Tolerated well   Pt received in recliner & agreeable to mobility. No c/o of pain, shortness of breath or dizziness.Pt mentioned wanting to ambulate after lunch. Pt left in recliner w/ necessities within reach.   [facility]

## 2022-07-31 NOTE — Care Management Important Message (Signed)
Important Message  Patient Details IM Letter given to the Patient. Name: Jonathan Lane MRN: [id number] Date of Birth: 1940/12/21   Medicare Important Message Given:  Yes     Kerin Salen 07/31/2022, 12:36 PM

## 2022-07-31 NOTE — Plan of Care (Signed)
  Problem: Nutrition: Goal: Adequate nutrition will be maintained Outcome: Adequate for Discharge   Problem: Activity: Goal: Risk for activity intolerance will decrease Outcome: Adequate for Discharge   Problem: Elimination: Goal: Will not experience complications related to bowel motility Outcome: Adequate for Discharge   Problem: Pain Managment: Goal: General experience of comfort will improve Outcome: Adequate for Discharge

## 2022-07-31 NOTE — TOC Progression Note (Signed)
Transition of Care [facility]) - Progression Note    Patient Details  Name: Jonathan Lane MRN: [id number] Date of Birth: 06-Sep-1941  [facility]) CM/SW Contact  Leeroy Cha, RN Phone Number: 07/31/2022, 7:28 AM  Clinical Narrative:    Post op ileus but is taking a full lig diet.  Possible dc to home on [phone]   Expected Discharge Plan: Home/Self Care Barriers to Discharge: Continued Medical Work up  Expected Discharge Plan and Services Expected Discharge Plan: Home/Self Care   Discharge Planning Services: CM Consult   Living arrangements for the past 2 months: Single Family Home                                       Social Determinants of Health (SDOH) Interventions    Readmission Risk Interventions     No data to display

## 2022-07-31 NOTE — TOC Transition Note (Signed)
Transition of Care [facility]) - CM/SW Discharge Note   Patient Details  Name: Jonathan Lane MRN: [id number] Date of Birth: [birth year]  Transition of Care [facility]) CM/SW Contact:  Leeroy Cha, RN Phone Number: 07/31/2022, 10:38 AM   Clinical Narrative:    Patient dcd to go home with family and self care.  No toc needs present.   Final next level of care: Home/Self Care Barriers to Discharge: Barriers Resolved   Patient Goals and CMS Choice Patient states their goals for this hospitalization and ongoing recovery are:: to go home CMS Medicare.gov Compare Post Acute Care list provided to:: Patient    Discharge Placement                       Discharge Plan and Services   Discharge Planning Services: CM Consult                                 Social Determinants of Health (SDOH) Interventions     Readmission Risk Interventions     No data to display

## 2022-07-31 NOTE — Discharge Summary (Signed)
Physician Discharge Summary  Patient ID: Jonathan Lane MRN: [id number] DOB/AGE: [birth year] 81 y.o.  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 Path [id number] with negative margins. JP removed 8/11 as output scant.   2 - Mild Ileus - pt with emesis x few POD 1 after regular diet. Still having flatus. Reglan started and return to clears POD 2. By POD 4 completely resumed bowel function with regular diet.  By the AM of 08/10/22, the day of discharge, he is ambulatory, tollerating regular diet , pain controlled without narcotics, and felt to be adequtae for discharge. Hgb 12 at DC.    Consults: None  Significant Diagnostic Studies: labs: as per above  Treatments: surgery: as per above  Discharge Exam: Blood pressure (!) 169/81, pulse 74, temperature 98.9 F (37.2 C), temperature source Oral, resp. rate 18, height 6' 1.5" (1.867 m), weight 86.2 kg, SpO2 93 %.  NAD, AOx 3. Girlfriend Caryl Asp joins [place] byphone Wearing glgasses Non-labored breathign on RA, enjoyingn breakfast SNTND RRR Recent port sites c/d/I. JP site with dry dressing No c/c/e   Disposition:    Allergies as of 07/31/2022       Reactions   Brimonidine Tartrate Other (See Comments)   Irritates the eyes and increased pressure   Tetracycline Hcl Other (See Comments)   Doesn't remember reaction-"like 55 years ago"   Ciprofloxacin Other (See Comments)   Constipation        Medication List     STOP taking these medications    Adult Gummy Chew   B-12 PO   cholecalciferol 25 MCG (1000 UNIT) tablet Commonly known as: VITAMIN D3   clopidogrel 75 MG tablet Commonly known  as: PLAVIX   CoQ10 200 MG Caps   CRANBERRY PO   Fish Oil 1200 MG Caps   Lutein-Zeaxanthin Tabs   Melatonin 3 MG Caps   MOVE FREE PO   OVER THE COUNTER MEDICATION   OVER THE COUNTER MEDICATION   PRESERVISION AREDS 2 PO   Prevagen Extra Strength 20 MG Caps Generic drug: Apoaequorin   Prostate Health Caps   ZINC PO       TAKE these medications    acetaminophen 500 MG tablet Commonly known as: TYLENOL Take 500 mg by mouth every 8 (eight) hours as needed for mild pain or headache.   ALIGN PO Take 1 capsule by mouth in the morning.   atorvastatin 20 MG tablet Commonly known as: LIPITOR Take 1 tablet (20 mg total) by mouth daily. What changed: when to take this   Cosopt PF 22.3-6.8 MG/ML Soln ophthalmic solution Generic drug: dorzolamidel-timolol Place 1 drop into both eyes in the morning and at bedtime.   Dilt-XR 240 MG 24 hr capsule Generic drug: diltiazem TAKE ONE CAPSULE BY MOUTH ONCE DAILY What changed:  how much to take when to take this   docusate sodium 100 MG capsule Commonly known as: COLACE Take 1 capsule (100 mg total) by mouth 2 (two) times daily.   esomeprazole 20 MG capsule Commonly known as: NEXIUM Take 20 mg by mouth 2 (two) times daily before a meal.   fluorouracil 5 % cream Commonly known as: EFUDEX Apply 1 Application topically  at bedtime.   fluticasone 50 MCG/ACT nasal spray Commonly known as: FLONASE Place 2 sprays into both nostrils daily as needed for allergies or rhinitis.   losartan-hydrochlorothiazide 100-25 MG tablet Commonly known as: HYZAAR TAKE ONE TABLET BY MOUTH ONCE DAILY   nitroGLYCERIN 0.4 MG SL tablet Commonly known as: NITROSTAT Place 1 tablet (0.4 mg total) under the tongue every 5 (five) minutes as needed for chest pain.   NON FORMULARY Pt uses a cpap nightly   Rhopressa 0.02 % Soln Generic drug: Netarsudil Dimesylate Place 1 drop into both eyes at bedtime.   tadalafil 5 MG tablet Commonly known  as: CIALIS Take 5 mg by mouth daily.   traMADol 50 MG tablet Commonly known as: Ultram Take 1 to 2 tablets  by mouth every 6 hours as needed for moderate pain or severe pain.   valACYclovir 1000 MG tablet Commonly known as: VALTREX Take 1,000 mg by mouth 2 (two) times daily as needed (for outbreaks).        Follow-up Information     Alexis Frock, MD Follow up on 08/07/2022.   Specialty: Urology Why: at 2:15 fom X-Ray, MD visit, pathology review, and likely catheter removal. Contact information: [place] [address] [phone]                 Signed: Alexis Frock 07/31/2022, 10:11 AM

## 2022-07-31 NOTE — Progress Notes (Signed)
Mobility Specialist - Progress Note    07/31/22 1512  Mobility  HOB Elevated/Bed Position Self regulated  Activity Ambulated independently in hallway  Range of Motion/Exercises Active  Level of Assistance Contact guard assist, steadying assist  Assistive Device Front wheel walker  Distance Ambulated (ft) 850 ft  Activity Response Tolerated well  Transport method Ambulatory  $Mobility charge 1 Mobility   Pt received in recliner. This was the patients third walk for the day before heading home. Pt took one standing break. Pt left in recliner w/ necessities within in reach.   [facility]

## 2022-08-07 DIAGNOSIS — C670 Malignant neoplasm of trigone of bladder: Secondary | ICD-10-CM | POA: Diagnosis not present

## 2022-08-07 DIAGNOSIS — C662 Malignant neoplasm of left ureter: Secondary | ICD-10-CM | POA: Diagnosis not present

## 2022-08-08 ENCOUNTER — Emergency Department (HOSPITAL_COMMUNITY)
Admission: EM | Admit: 2022-08-08 | Discharge: 2022-08-08 | Disposition: A | Discharge status: Home / Self Care | Payer: PPO | Attending: Emergency Medicine | Admitting: Emergency Medicine

## 2022-08-08 ENCOUNTER — Encounter (HOSPITAL_COMMUNITY): Payer: Self-pay

## 2022-08-08 ENCOUNTER — Emergency Department (HOSPITAL_COMMUNITY): Payer: PPO

## 2022-08-08 DIAGNOSIS — R1084 Generalized abdominal pain: Secondary | ICD-10-CM | POA: Insufficient documentation

## 2022-08-08 DIAGNOSIS — R339 Retention of urine, unspecified: Secondary | ICD-10-CM | POA: Diagnosis not present

## 2022-08-08 LAB — CBC WITH DIFFERENTIAL/PLATELET
Abs Immature Granulocytes: 0.07 10*3/uL (ref 0.00–0.07)
Basophils Absolute: 0.1 10*3/uL (ref 0.0–0.1)
Basophils Relative: 0 %
Eosinophils Absolute: 0.1 10*3/uL (ref 0.0–0.5)
Eosinophils Relative: 1 %
HCT: 37.4 % — ABNORMAL LOW (ref 39.0–52.0)
Hemoglobin: 12.6 g/dL — ABNORMAL LOW (ref 13.0–17.0)
Immature Granulocytes: 1 %
Lymphocytes Relative: 10 %
Lymphs Abs: 1.2 10*3/uL (ref 0.7–4.0)
MCH: 32.4 pg (ref 26.0–34.0)
MCHC: 33.7 g/dL (ref 30.0–36.0)
MCV: 96.1 fL (ref 80.0–100.0)
Monocytes Absolute: 0.9 10*3/uL (ref 0.1–1.0)
Monocytes Relative: 8 %
Neutro Abs: 9.2 10*3/uL — ABNORMAL HIGH (ref 1.7–7.7)
Neutrophils Relative %: 80 %
Platelets: 263 10*3/uL (ref 150–400)
RBC: 3.89 MIL/uL — ABNORMAL LOW (ref 4.22–5.81)
RDW: 13.9 % (ref 11.5–15.5)
WBC: 11.6 10*3/uL — ABNORMAL HIGH (ref 4.0–10.5)
nRBC: 0 % (ref 0.0–0.2)

## 2022-08-08 LAB — I-STAT CHEM 8, ED
BUN: 16 mg/dL (ref 8–23)
Calcium, Ion: 1.18 mmol/L (ref 1.15–1.40)
Chloride: 100 mmol/L (ref 98–111)
Creatinine, Ser: 0.8 mg/dL (ref 0.61–1.24)
Glucose, Bld: 120 mg/dL — ABNORMAL HIGH (ref 70–99)
HCT: 37 % — ABNORMAL LOW (ref 39.0–52.0)
Hemoglobin: 12.6 g/dL — ABNORMAL LOW (ref 13.0–17.0)
Potassium: 3.6 mmol/L (ref 3.5–5.1)
Sodium: 137 mmol/L (ref 135–145)
TCO2: 23 mmol/L (ref 22–32)

## 2022-08-08 LAB — URINALYSIS, ROUTINE W REFLEX MICROSCOPIC
Bilirubin Urine: NEGATIVE
Glucose, UA: NEGATIVE mg/dL
Ketones, ur: NEGATIVE mg/dL
Leukocytes,Ua: NEGATIVE
Nitrite: NEGATIVE
Protein, ur: 100 mg/dL — AB
RBC / HPF: >50 RBC/hpf — ABNORMAL HIGH (ref 0–5)
Specific Gravity, Urine: 1.016 (ref 1.005–1.030)
pH: 6 (ref 5.0–8.0)

## 2022-08-08 LAB — BASIC METABOLIC PANEL
Anion gap: 7 (ref 5–15)
BUN: 17 mg/dL (ref 8–23)
CO2: 25 mmol/L (ref 22–32)
Calcium: 8.8 mg/dL — ABNORMAL LOW (ref 8.9–10.3)
Chloride: 106 mmol/L (ref 98–111)
Creatinine, Ser: 0.95 mg/dL (ref 0.61–1.24)
GFR, Estimated: >60 mL/min (ref >60)
Glucose, Bld: 121 mg/dL — ABNORMAL HIGH (ref 70–99)
Potassium: 3.6 mmol/L (ref 3.5–5.1)
Sodium: 138 mmol/L (ref 135–145)

## 2022-08-08 MED ORDER — MORPHINE SULFATE (PF) 2 MG/ML IV SOLN
2.0000 mg | Freq: Once | INTRAVENOUS | Status: AC
  Administered 2022-08-08: 2 mg via INTRAVENOUS
  Filled 2022-08-08: qty 1

## 2022-08-08 MED ORDER — SODIUM CHLORIDE (PF) 0.9 % IJ SOLN
INTRAMUSCULAR | Status: AC
  Filled 2022-08-08: qty 50

## 2022-08-08 MED ORDER — LIDOCAINE HCL URETHRAL/MUCOSAL 2 % EX GEL
1.0000 | Freq: Once | CUTANEOUS | Status: AC
  Administered 2022-08-08: 1 via URETHRAL
  Filled 2022-08-08: qty 11

## 2022-08-08 MED ORDER — TAMSULOSIN HCL 0.4 MG PO CAPS: 0.4000 mg | ORAL_CAPSULE | Freq: Every day | ORAL | 0 refills | Status: DC

## 2022-08-08 MED ORDER — SODIUM CHLORIDE 0.9 % IV SOLN
INTRAVENOUS | Status: AC
  Filled 2022-08-08: qty 500

## 2022-08-08 MED ORDER — IOHEXOL 300 MG/ML  SOLN
100.0000 mL | Freq: Once | INTRAMUSCULAR | Status: DC | PRN
Start: 2022-08-08 — End: 2022-08-08

## 2022-08-08 MED ORDER — ONDANSETRON HCL 4 MG/2ML IJ SOLN
4.0000 mg | Freq: Once | INTRAMUSCULAR | Status: AC
  Administered 2022-08-08: 4 mg via INTRAVENOUS
  Filled 2022-08-08: qty 2

## 2022-08-08 NOTE — Consult Note (Signed)
81 year old presented to the emergency department with urinary retention and acute suprapubic and pelvic pain.  He is status post left distal ureterectomy and reimplant.  He was discharged home last week from the hospital after a slightly prolonged course because of ileus.  He subsequently presented to our office yesterday and had his Foley catheter removed after a cystogram demonstrating complete healing of his bladder and anastomosis.  He subsequently was able to void in the clinic but at home was unable to void more than dribbles.  In the middle the night he woke up with intense pain.  He was not having any nausea or vomiting.  He is not having any fevers or chills.  He is not having any hematuria.  In the emergency department a Foley catheter was placed and 350 cc of straw-colored urine was obtained.  The patient subsequently felt significantly better.  His labs were noted to be all within normal limits.  Given the above, I recommended no additional testing or imaging.  We will plan for him to keep his Foley catheter for 1 week, start him on tamsulosin, and have him follow-up for voiding trial next Monday in our office.  I will work to get that set up.

## 2022-08-08 NOTE — ED Triage Notes (Signed)
Patient arrived with urinary retention after his catheter was removed yesterday, states the last time he was able to urinate was last night around 2300.

## 2022-08-08 NOTE — Discharge Instructions (Signed)

## 2022-08-08 NOTE — ED Provider Notes (Incomplete)
[place] DEPT Provider Note   CSN: [id number] Arrival date & time: 08/08/22  0533     History {Add pertinent medical, surgical, social history, OB history to HPI:1} Chief Complaint  Patient presents with   Urinary Retention    Jonathan Lane is a 81 y.o. male.  81 yo M with a chief complaints of urinary retention.  The patient tells me that he had a distal portion of his left ureter removed and since then he had some reimplanted to the dome of his ureter.  He had his Foley catheter removed yesterday.  Since then has only had dribbles of urine out.  This completely stopped about 11 PM last night.  Started having worsening discomfort throughout the evening and ended up calling the urology office who suggested he come here for evaluation.  He denies nausea or vomiting.  Denies fevers.        Home Medications Prior to Admission medications   Medication Sig Start Date End Date Taking? Authorizing Provider  acetaminophen (TYLENOL) 500 MG tablet Take 500 mg by mouth every 8 (eight) hours as needed for mild pain or headache.    [provider]  atorvastatin (LIPITOR) 20 MG tablet Take 1 tablet (20 mg total) by mouth daily. Patient taking differently: Take 20 mg by mouth at bedtime. 08/26/19   Domenic Polite, MD  COSOPT PF 2-0.5 % SOLN ophthalmic solution Place 1 drop into both eyes in the morning and at bedtime.    [provider]  DILT-XR 240 MG 24 hr capsule TAKE ONE CAPSULE BY MOUTH ONCE DAILY Patient taking differently: Take 240 mg by mouth at bedtime. 12/16/21   Josue Hector, MD  docusate sodium (COLACE) 100 MG capsule Take 1 capsule (100 mg total) by mouth 2 (two) times daily. 07/26/22   Debbrah Alar, PA-C  esomeprazole (NEXIUM) 20 MG capsule Take 20 mg by mouth 2 (two) times daily before a meal.    [provider]  fluorouracil (EFUDEX) 5 % cream Apply 1 Application topically at bedtime. 07/10/22   [provider]  fluticasone (FLONASE) 50 MCG/ACT nasal spray Place 2 sprays into both nostrils daily as needed for allergies or rhinitis.    [provider]  losartan-hydrochlorothiazide (HYZAAR) 100-25 MG tablet TAKE ONE TABLET BY MOUTH ONCE DAILY 03/08/22   Josue Hector, MD  nitroGLYCERIN (NITROSTAT) 0.4 MG SL tablet Place 1 tablet (0.4 mg total) under the tongue every 5 (five) minutes as needed for chest pain. Patient not taking: Reported on 07/14/2022 08/18/19   Regalado, Jerald Kief A, MD  NON FORMULARY Pt uses a cpap nightly    [provider]  Probiotic Product (ALIGN PO) Take 1 capsule by mouth in the morning. Patient not taking: Reported on 07/14/2022    [provider]  RHOPRESSA 0.02 % SOLN Place 1 drop into both eyes at bedtime.  01/08/18   [provider]  tadalafil (CIALIS) 5 MG tablet Take 5 mg by mouth daily.    [provider]  traMADol (ULTRAM) 50 MG tablet Take 1 to 2 tablets  by mouth every 6 hours as needed for moderate pain or severe pain. 07/26/22   Debbrah Alar, PA-C  valACYclovir (VALTREX) 1000 MG tablet Take 1,000 mg by mouth 2 (two) times daily as needed (for outbreaks).    [provider]      Allergies    Brimonidine tartrate, Tetracycline hcl, and Ciprofloxacin    Review of Systems  Review of Systems  Physical Exam Updated Vital Signs BP (!) 182/90 (BP Location: Right Arm)   Pulse 74   Temp 97.8 F (36.6 C) (Oral)   Resp 18   Ht '6\' 2"'$  (1.88 m)   Wt 86.2 kg   SpO2 94%   BMI 24.39 kg/m  Physical Exam Vitals and nursing note reviewed.  Constitutional:      Appearance: He is well-developed.  HENT:     Head: Normocephalic and atraumatic.  Eyes:     Pupils: Pupils are equal, round, and reactive to light.  Neck:     Vascular: No JVD.  Cardiovascular:     Rate and Rhythm: Normal rate and regular rhythm.     Heart sounds: No murmur heard.    No friction rub. No gallop.  Pulmonary:     Effort: No respiratory  distress.     Breath sounds: No wheezing.  Abdominal:     General: There is no distension.     Tenderness: There is abdominal tenderness. There is no guarding or rebound.     Comments: Diffuse abdominal discomfort on exam.  No obvious suprapubic fullness.  No palpable bladder.  Musculoskeletal:        General: Normal range of motion.     Cervical back: Normal range of motion and neck supple.  Skin:    Coloration: Skin is not pale.     Findings: No rash.  Neurological:     Mental Status: He is alert and oriented to person, place, and time.  Psychiatric:        Behavior: Behavior normal.     ED Results / Procedures / Treatments   Labs (all labs ordered are listed, but only abnormal results are displayed) Labs Reviewed  I-STAT CHEM 8, ED - Abnormal; Notable for the following components:      Result Value   Glucose, Bld 120 (*)    Hemoglobin 12.6 (*)    HCT 37.0 (*)    All other components within normal limits  CBC WITH DIFFERENTIAL/PLATELET  BASIC METABOLIC PANEL    EKG None  Radiology No results found.  Procedures Procedures   Emergency Focused Ultrasound Exam Limited ultrasound of the Bladder.   Performed and interpreted by Dr. Tyrone Nine Indication: evaluation for urinary retention Transverse and Sagittal views of the bladder are obtained and calculations are performed to determine an estimated bladder volume for signs of post-renal obstruction.  Findings: Bladder is 146cc full Interpretation: no evidence of outlet obstruction Images archived electronically.  CPT Codes: [phone] and [phone]  {Document cardiac monitor, telemetry assessment procedure when appropriate:1}  Medications Ordered in ED Medications  lidocaine (XYLOCAINE) 2 % jelly 1 Application (1 Application Urethral Given 08/08/22 0633)  morphine (PF) 2 MG/ML injection 2 mg (2 mg Intravenous Given 08/08/22 0622)  ondansetron (ZOFRAN) injection 4 mg (4 mg Intravenous Given 08/08/22 0620)    ED Course/  Medical Decision Making/ A&P                           Medical Decision Making Amount and/or Complexity of Data Reviewed Labs: ordered. Radiology: ordered.  Risk Prescription drug management.   81 yo M with a chief complaints of abdominal pain.  Patient has recently undergone a procedure to remove part of his distal ureter and had it reimplanted on the dome of his bladder.  I discussed the case with Dr. Gilford Rile.  He felt it was reasonable to place Foley catheter.  Also agreed that CT imaging would be helpful.  Patient's renal function appears to be at baseline.  Will obtain a CT scan.    {Document critical care time when appropriate:1} {Document review of labs and clinical decision tools ie heart score, Chads2Vasc2 etc:1}  {Document your independent review of radiology images, and any outside records:1} {Document your discussion with family members, caretakers, and with consultants:1} {Document social determinants of health affecting pt's care:1} {Document your decision making why or why not admission, treatments were needed:1} Final Clinical Impression(s) / ED Diagnoses Final diagnoses:  None    Rx / DC Orders ED Discharge Orders     None

## 2022-08-08 NOTE — ED Provider Notes (Signed)
  Physical Exam  BP (!) 175/87   Pulse 68   Temp 97.8 F (36.6 C) (Oral)   Resp 17   Ht 1.88 m ('6\' 2"'$ )   Wt 86.2 kg   SpO2 96%   BMI 24.39 kg/m   Physical Exam  Procedures  Procedures  ED Course / MDM    Medical Decision Making Amount and/or Complexity of Data Reviewed Labs: ordered. Radiology: ordered.  Risk Prescription drug management.   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  Now with decreased uop and increased pain.  Bladder scan 0 and bedside US with echogenic structures ?140cc.Replaced foley and    Dr. Tyrone Nine discussed with Dr. Lovena Neighbours and advised ct scan Awaiting imaging. Will contact urology after Patient has drained 380 cc of urine.  His symptoms have resolved. Dr. Lovena Neighbours saw advised that we can cancel CAT scan and patient is safe for discharge.     Jonathan Boss, MD 08/08/22 [phone]

## 2022-08-09 LAB — URINE CULTURE: Culture: NO GROWTH

## 2022-08-16 DIAGNOSIS — R338 Other retention of urine: Secondary | ICD-10-CM | POA: Diagnosis not present

## 2022-08-23 ENCOUNTER — Telehealth: Payer: Self-pay

## 2022-08-23 NOTE — Telephone Encounter (Signed)
Returned pt's call inquiring about an appointment. Pt c/o L side hurting and burning. LVM for pt to call office tomorrow to speak to triage nurse about scheduling an appointment.

## 2022-08-24 ENCOUNTER — Telehealth: Payer: Self-pay

## 2022-08-24 DIAGNOSIS — M79605 Pain in left leg: Secondary | ICD-10-CM | POA: Diagnosis not present

## 2022-08-24 DIAGNOSIS — M25562 Pain in left knee: Secondary | ICD-10-CM

## 2022-08-24 DIAGNOSIS — M79604 Pain in right leg: Secondary | ICD-10-CM | POA: Diagnosis not present

## 2022-08-24 NOTE — Telephone Encounter (Signed)
Pt called, two identifiers used, stating that he noticed an area on his L leg approx mid-thigh that was hot to the touch, pain with and without activity, and pain with palpation approx 2 weeks ago. He had urogenital surgery approx 1 month ago and was off Plavix 5 days prior and 5 days post surgery. He denies any open areas, no drainage, no swelling, and no cold extremity. Pt scheduled for DVT US and f/u with PA on 9/6. Confirmed understanding.

## 2022-08-25 ENCOUNTER — Ambulatory Visit: Payer: PPO | Admitting: Physician Assistant

## 2022-08-25 ENCOUNTER — Ambulatory Visit (HOSPITAL_COMMUNITY)
Admission: RE | Admit: 2022-08-25 | Discharge: 2022-08-25 | Disposition: A | Discharge status: Home / Self Care | Payer: PPO | Source: Ambulatory Visit | Attending: Vascular Surgery | Admitting: Vascular Surgery

## 2022-08-25 ENCOUNTER — Telehealth: Payer: Self-pay | Admitting: Pharmacist

## 2022-08-25 ENCOUNTER — Encounter: Payer: Self-pay | Admitting: Physician Assistant

## 2022-08-25 VITALS — BP 152/82 | HR 71 | Temp 98.2°F | Resp 20 | Ht 74.0 in | Wt 188.0 lb

## 2022-08-25 DIAGNOSIS — I82402 Acute embolism and thrombosis of unspecified deep veins of left lower extremity: Secondary | ICD-10-CM | POA: Diagnosis not present

## 2022-08-25 DIAGNOSIS — M25562 Pain in left knee: Secondary | ICD-10-CM | POA: Diagnosis not present

## 2022-08-25 MED ORDER — APIXABAN (ELIQUIS) VTE STARTER PACK (10MG AND 5MG): ORAL_TABLET | ORAL | 0 refills | Status: DC

## 2022-08-25 MED ORDER — APIXABAN 5 MG PO TABS: 5.0000 mg | ORAL_TABLET | Freq: Two times a day (BID) | ORAL | 5 refills | Status: DC

## 2022-08-25 NOTE — Progress Notes (Signed)
[facility])   [place] Team Medication Reconciliation  08/25/2022  Ash[place] Grundman Esquivias Jul 12, 1941 [id number]  Referral source:  [facility] QC Team   OBJECTIVE:  Lab Results  Component Value Date   CREATININE 0.80 08/08/2022   CREATININE 0.95 08/08/2022   CREATININE 0.71 07/31/2022    Lab Results  Component Value Date   HGBA1C 5.6 08/26/2019       Component Value Date/Time   CHOL 123 08/18/2019 0550   TRIG 54 08/18/2019 0550   HDL 37 (L) 08/18/2019 0550   CHOLHDL 3.3 08/18/2019 0550   VLDL 11 08/18/2019 0550   LDLCALC 75 08/18/2019 0550    BP Readings from Last 3 Encounters:  08/08/22 (!) 175/87  07/31/22 (!) 149/75  07/18/22 136/72    Allergies  Allergen Reactions   Brimonidine Tartrate Other (See Comments)    Irritates the eyes and increased pressure   Tetracycline Hcl Other (See Comments)    Doesn't remember reaction-"like 55 years ago"    Ciprofloxacin Other (See Comments)    Constipation    ASSESSMENT:  Patient was recently discharged from Hospital and all medications have been reviewed.  Date Discharged from Hospital: 07/31/2022  Date Medication Reconciliation Performed: 08/25/2022   Current Outpatient Medications:    acetaminophen (TYLENOL) 500 MG tablet, Take 500 mg by mouth every 8 (eight) hours as needed for mild pain or headache., Disp: , Rfl:    atorvastatin (LIPITOR) 20 MG tablet, Take 1 tablet (20 mg total) by mouth daily. (Patient taking differently: Take 20 mg by mouth at bedtime.), Disp: 30 tablet, Rfl: 0   COSOPT PF 2-0.5 % SOLN ophthalmic solution, Place 1 drop into both eyes in the morning and at bedtime., Disp: , Rfl:    DILT-XR 240 MG 24 hr capsule, TAKE ONE CAPSULE BY MOUTH ONCE DAILY (Patient taking differently: Take 240 mg by mouth at bedtime.), Disp: 90 capsule, Rfl: 3   docusate sodium (COLACE) 100 MG capsule, Take 1 capsule (100 mg total) by mouth 2 (two) times daily., Disp: , Rfl:    esomeprazole (NEXIUM)  20 MG capsule, Take 20 mg by mouth 2 (two) times daily before a meal., Disp: , Rfl:    fluorouracil (EFUDEX) 5 % cream, Apply 1 Application topically at bedtime., Disp: , Rfl:    fluticasone (FLONASE) 50 MCG/ACT nasal spray, Place 2 sprays into both nostrils daily as needed for allergies or rhinitis., Disp: , Rfl:    losartan-hydrochlorothiazide (HYZAAR) 100-25 MG tablet, TAKE ONE TABLET BY MOUTH ONCE DAILY, Disp: 90 tablet, Rfl: 2   nitroGLYCERIN (NITROSTAT) 0.4 MG SL tablet, Place 1 tablet (0.4 mg total) under the tongue every 5 (five) minutes as needed for chest pain. (Patient not taking: Reported on 07/14/2022), Disp: 10 tablet, Rfl: 1   NON FORMULARY, Pt uses a cpap nightly, Disp: , Rfl:    Probiotic Product (ALIGN PO), Take 1 capsule by mouth in the morning. (Patient not taking: Reported on 07/14/2022), Disp: , Rfl:    RHOPRESSA 0.02 % SOLN, Place 1 drop into both eyes at bedtime. , Disp: , Rfl:    tadalafil (CIALIS) 5 MG tablet, Take 5 mg by mouth daily., Disp: , Rfl:    tamsulosin (FLOMAX) 0.4 MG CAPS capsule, Take 1 capsule (0.4 mg total) by mouth daily., Disp: 30 capsule, Rfl: 0   traMADol (ULTRAM) 50 MG tablet, Take 1 to 2 tablets  by mouth every 6 hours as needed for moderate pain or severe pain., Disp: 20 tablet,  Rfl: 0   valACYclovir (VALTREX) 1000 MG tablet, Take 1,000 mg by mouth 2 (two) times daily as needed (for outbreaks)., Disp: , Rfl:    Medications Discontinued at Discharge:  Adult Gummy Chew  Chew 2 capsules by mouth daily. Cyanocobalamin Vitamin B 12 Take 1,000 mcg by mouth daily. cholecalciferol (VITAMIN D3) 25 MCG (1000 UNIT) Take 1,000 Units by mouth daily. Coenzyme Q10 (COQ10) 200 MG CAPS Take 200 mg by mouth daily. Cranberry Take 1 tablet by mouth daily. Omega-3 Fatty Acids (FISH OIL) 1200 MG CAPS Take 1,200 mg by mouth in the morning and at bedtime. Multiple Vitamins-Minerals (LUTEIN-ZEAXANTHIN) TABS Take 1 tablet by mouth daily. Melatonin 3 MG CAPS Take 6 mg by  mouth at bedtime. Glucosamine-Chondroitin (MOVE FREE PO) Take 1 tablet by mouth daily. Multiple Vitamins-Minerals (PRESERVISION AREDS 2 PO) Take 1 capsule by mouth in the morning and at bedtime.  Apoaequorin (PREVAGEN EXTRA STRENGTH) 20 MG CAPS Take 20 mg by mouth daily. Misc Natural Products (PROSTATE HEALTH) CAPS Take 1 capsule by mouth in the morning. Zinc 25 MG TABS Take 25 mg by mouth daily  New Medications at Discharge:  Docusate sodium 100 mg Take 1 capsule (100 mg) by mouth 2 (two) times daily.  Tramadol 50 mg Take 1 to 2 tablets by mouth every 6 hours as needed for moderate to sever pain  Medications with Dose Adjustments at Discharge: None   Plan:     Placed initial outreach to patient to attempt MRP. Left voicemail for patient to return my call. Will call patient back in 1 to 3 business days.   Loretha Brasil, PharmD [place] Pharmacist Office: [phone]

## 2022-08-25 NOTE — Progress Notes (Signed)
[facility]           [place]  History and Physical   Jonathan Lane is a 81 y.o. male who presents with left thigh pain that started about 2 weeks ago (2 weeks after surgery).  He decided to have it checked  out since it has not really gotten better.  He has not had any swelling or other issues.  He states he wears compression socks at night.  He has been staying  active since surgery and walking 3x/day.  He denies any shortness of breath or chest pain.   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  Would not consider repair unless it reached 3.5cm and he was scheduled for 2 year follow up.    Pt has hx of left ureteral cancer and recently underwent robotic LEFT distal ureterectomy with psoas hitch + V-Y plasty ureteral reimplant / pelvic node dissection, cysto / stent exchange 07/26/22 by Dr. Tresa Moore and was discharged on 07/31/2022.   He tells me he hit the lottery as he had clear margins at time of surgery.    He states that he had left knee surgery about 3 years ago.  He has had bilateral calf pain with walking but also at rest.  He states that it is not consistent and is in both legs at different times.  He states that Dr. Elmyra Ricks felt it was spinal stenosis and to stay as active as possible.    The pt is on a statin for cholesterol management.  The pt is not on a daily aspirin.   Other AC:  none The pt is on CCB for hypertension.   The pt is not diabetic.   Tobacco hx:  former   Past Medical History:  Diagnosis Date   AAA (abdominal aortic aneurysm) (HCC)    Atrial fibrillation (HCC)    Bradycardia    Bradycardia    Cancer (HCC)    Cardiac murmur    Dizziness    Dysrhythmia    GERD (gastroesophageal reflux disease)    Glaucoma    Heart palpitations    Hyperlipidemia    Hypertension    Numbness 08/26/2019   LEFT FACE   Pneumonia    Skin  abnormalities    pre cancerous lesion R hand   Sleep apnea    uses cpap   Snoring     Past Surgical History:  Procedure Laterality Date   APPENDECTOMY  12/19/1963   COLONOSCOPY     CYSTOSCOPY WITH BIOPSY N/A 05/16/2022   Procedure: CYSTOSCOPY WITH BIOPSY;  Surgeon: [name], MD;  Location: [facility];  Service: Urology;  Laterality: N/A;   CYSTOSCOPY WITH RETROGRADE PYELOGRAM, URETEROSCOPY AND STENT PLACEMENT Bilateral 05/16/2022   Procedure: CYSTOSCOPY WITH BILATERAL RETROGRADE PYELOGRAM, LEFT URETEROSCOPY WITH BIOPSY, HOLMIUM LASER OF LEFT URETERAL STONE  AND LEFT STENT EXCHANGE, TUR LEFT URETERAL ORIFACE;  Surgeon: [name], MD;  Location: [facility];  Service: Urology;  Laterality: Bilateral;  64 MINUTES   CYSTOSCOPY WITH RETROGRADE PYELOGRAM, URETEROSCOPY AND STENT PLACEMENT Left 07/26/2022   Procedure: CYSTOSCOPY WITH RETROGRADE PYELOGRAM, URETEROSCOPY AND STENT EXCHANGE;  Surgeon: Alexis Frock, MD;  Location: [facility];  Service: Urology;  Laterality: Left;   LYMPH NODE DISSECTION Bilateral 07/26/2022   Procedure: LYMPH NODE DISSECTION;  Surgeon: Alexis Frock, MD;  Location:  [facility];  Service: Urology;  Laterality: Bilateral;   POLYPECTOMY     SKIN CANCER EXCISION     TONSILLECTOMY     as a child x2   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   Procedure: RESTAGING TRANSURETHRAL RESECTION OF BLADDER TUMOR  (TURBT) LEFT URETEROSCOPY WITH LASER LEFT STENT EXCHANGE RIGHT STENT REMOVAL;  Surgeon: [name], MD;  Location: [facility];  Service: Urology;  Laterality: Bilateral;   TRANSURETHRAL RESECTION OF BLADDER TUMOR WITH MITOMYCIN-C Bilateral 01/03/2022   Procedure: CYSTOSCOPY TRANSURETHRAL RESECTION OF BLADDER TUMOR WITH POSSIBLE  BILATERAL RETROGRADES STENT PLACEMENT;  Surgeon: [name], MD;  Location: [facility];  Service: Urology;  Laterality: Bilateral;   WISDOM TOOTH EXTRACTION      Social History   Socioeconomic History   Marital status: Widowed    Spouse  name: [name]   Number of children: 1   Years of education: Masters   Highest education level: Not on file  Occupational History    Employer: RETIRED  Tobacco Use   Smoking status: Former    Types: Cigarettes    Quit date: 12/18/1965    Years since quitting: 56.7   Smokeless tobacco: Never  Vaping Use   Vaping Use: Never used  Substance and Sexual Activity   Alcohol use: Not Currently    Comment: 5-7 drinks per week   Drug use: No   Sexual activity: Not on file  Other Topics Concern   Not on file  Social History Narrative   Patient is married [name]).   Patient is retired.   Patient has one adult child.   Patient does not drink any caffeine.   Patient is right-handed.   Patient has a Scientist, water quality.            Social Determinants of Health   Financial Resource Strain: Not on file  Food Insecurity: Not on file  Transportation Needs: Not on file  Physical Activity: Not on file  Stress: Not on file  Social Connections: Not on file  Intimate Partner Violence: Not on file     Family History  Problem Relation Age of Onset   Stroke Father    Stroke Mother    Stroke Sister    Breast cancer Sister    High blood pressure Sister    Diabetes Sister    Colon cancer Other        Uncle   Colon cancer Paternal Uncle    Mitral valve prolapse Other        sugery 09-27-2017   Rectal cancer Neg Hx    Stomach cancer Neg Hx     Current Outpatient Medications  Medication Sig Dispense Refill   acetaminophen (TYLENOL) 500 MG tablet Take 500 mg by mouth every 8 (eight) hours as needed for mild pain or headache.     atorvastatin (LIPITOR) 20 MG tablet Take 1 tablet (20 mg total) by mouth daily. (Patient taking differently: Take 20 mg by mouth at bedtime.) 30 tablet 0   COSOPT PF 2-0.5 % SOLN ophthalmic solution Place 1 drop into both eyes in the morning and at bedtime.     DILT-XR 240 MG 24 hr capsule TAKE ONE CAPSULE BY MOUTH ONCE DAILY (Patient taking differently: Take 240 mg by  mouth at bedtime.) 90 capsule 3   docusate sodium (COLACE) 100 MG capsule Take 1 capsule (100 mg total) by mouth 2 (two) times daily.     esomeprazole (NEXIUM) 20 MG capsule Take 20 mg by  mouth 2 (two) times daily before a meal.     fluorouracil (EFUDEX) 5 % cream Apply 1 Application topically at bedtime.     fluticasone (FLONASE) 50 MCG/ACT nasal spray Place 2 sprays into both nostrils daily as needed for allergies or rhinitis.     losartan-hydrochlorothiazide (HYZAAR) 100-25 MG tablet TAKE ONE TABLET BY MOUTH ONCE DAILY 90 tablet 2   nitroGLYCERIN (NITROSTAT) 0.4 MG SL tablet Place 1 tablet (0.4 mg total) under the tongue every 5 (five) minutes as needed for chest pain. (Patient not taking: Reported on 07/14/2022) 10 tablet 1   NON FORMULARY Pt uses a cpap nightly     Probiotic Product (ALIGN PO) Take 1 capsule by mouth in the morning. (Patient not taking: Reported on 07/14/2022)     RHOPRESSA 0.02 % SOLN Place 1 drop into both eyes at bedtime.      tadalafil (CIALIS) 5 MG tablet Take 5 mg by mouth daily.     tamsulosin (FLOMAX) 0.4 MG CAPS capsule Take 1 capsule (0.4 mg total) by mouth daily. 30 capsule 0   traMADol (ULTRAM) 50 MG tablet Take 1 to 2 tablets  by mouth every 6 hours as needed for moderate pain or severe pain. 20 tablet 0   valACYclovir (VALTREX) 1000 MG tablet Take 1,000 mg by mouth 2 (two) times daily as needed (for outbreaks).     No current facility-administered medications for this visit.    Allergies  Allergen Reactions   Brimonidine Tartrate Other (See Comments)    Irritates the eyes and increased pressure   Tetracycline Hcl Other (See Comments)    Doesn't remember reaction-"like 55 years ago"    Ciprofloxacin Other (See Comments)    Constipation    REVIEW OF SYSTEMS:   '[X]'$  denotes positive finding, '[ ]'$  denotes negative finding Cardiac  Comments:  Chest pain or chest pressure:    Shortness of breath upon exertion:    Short of breath when lying flat:     Irregular heart rhythm:        Vascular    Pain in calf, thigh, or hip brought on by ambulation: x   Pain in feet at night that wakes you up from your sleep:     Blood clot in your veins:    Left thigh pain:  x       Pulmonary    Oxygen at home:    Productive cough:     Wheezing:         Neurologic    Sudden weakness in arms or legs:     Sudden numbness in arms or legs:     Sudden onset of difficulty speaking or slurred speech:    Temporary loss of vision in one eye:     Problems with dizziness:         Gastrointestinal    Blood in stool:     Vomited blood:         Genitourinary    Burning when urinating:     Blood in urine:        Psychiatric    Major depression:         Hematologic    Bleeding problems:    Problems with blood clotting too easily:        Skin    Rashes or ulcers:        Constitutional    Fever or chills:      PHYSICAL EXAMINATION:  Today's Vitals   08/25/22 1514  BP: (!) 152/82  Pulse: 71  Resp: 20  Temp: 98.2 F (36.8 C)  TempSrc: Temporal  SpO2: 95%  Weight: 188 lb (85.3 kg)  Height: '6\' 2"'$  (1.88 m)  PainSc: 6   PainLoc: Leg   Body mass index is 24.14 kg/m.   General:  WDWN in NAD; vital signs documented above Gait: Not observed HENT: WNL, normocephalic Pulmonary: normal non-labored breathing without wheezing Cardiac: regular HR Skin: without rashes Vascular Exam/Pulses:  Right Left  Radial 2+ (normal) 2+ (normal)  PT 2+ (normal) 2+ (normal)   Extremities: LLE without swelling; mild tenderness of left thigh  Neurologic: A&O X 3;  moving all extremities equally Psychiatric:  The pt has Normal affect.   Non-Invasive Vascular Imaging:   Venous duplex on 08/25/2022: RIGHT:  - No evidence of common femoral vein obstruction.     LEFT:  - Partial thrombus in the mid FV.  - Partial thrombus in the popliteal vein.  - No veins seen at the area of pain in the proximal anterior lateral thigh.   - Limited imaging of the  left EIV, CIV and the IVC demonstrates no obvious thrombus.   Jonathan Lane is a 81 y.o. male who presents with: persistent left thigh pain over past couple of weeks and he is about a month out from urological surgery for ureteral cancer.     -pt has easily palpable PT pedal pulses bilaterally -pt does  have evidence of DVT fortunately it is non occlusive and he really does not have any swelling.  This is most likely provoked given recent surgical hx for cancer.   I discussed with pt that he will need anticoagulation for 3-6 months.  If he develops subsequent DVT that is not provoked, he most likely would require AC indefinitely. -He was measured for thigh high compression sock today.  He will put this on in the am and remove at night.  Discussed with him to stay active.   -I have seen an Rx to his pharmacy for Eliquis starter pack and '5mg'$  bid thereafter with 5 refills.   -discussed that if he develops sudden chest pain or shortness of breath that he should call 911 or go to the ER.   -discussed that if he is travelling, he should stop about every hour and ambulate  -f/u with PCP  -f/u with [facility] in 2 years for CIA aneurysm surveillance.  -he will call sooner if he has any issues before then.    Leontine Locket, [facility] [phone]  Clinic MD:  Virl Cagey on call MD

## 2022-08-28 NOTE — Progress Notes (Addendum)
[facility])                                            [place] Team    08/28/2022  Calahan Pak Stucki January 03, 1941 [id number]   Placed second outreach attempt to Mr. [employer]. Patient stated that he was in a meeting and gave permission to call him back at another time. Will attempt a follow up telephone call within 1 to 2 business days.   Loretha Brasil, PharmD [place] Pharmacist Office: [phone]

## 2022-08-29 NOTE — Progress Notes (Signed)
[facility])   [place] Team Medication Reconciliation  08/29/2022  Demetres Prochnow Frieling 27-Dec-1940 [id number]  Referral source:  [facility] Quality Coordinator   OBJECTIVE:  Lab Results  Component Value Date   CREATININE 0.80 08/08/2022   CREATININE 0.95 08/08/2022   CREATININE 0.71 07/31/2022    Lab Results  Component Value Date   HGBA1C 5.6 08/26/2019       Component Value Date/Time   CHOL 123 08/18/2019 0550   TRIG 54 08/18/2019 0550   HDL 37 (L) 08/18/2019 0550   CHOLHDL 3.3 08/18/2019 0550   VLDL 11 08/18/2019 0550   LDLCALC 75 08/18/2019 0550    BP Readings from Last 3 Encounters:  08/25/22 (!) 152/82  08/08/22 (!) 175/87  07/31/22 (!) 149/75    Allergies  Allergen Reactions   Brimonidine Tartrate-Timolol Itching, Rash and Other (See Comments)    Itching eyes Other reaction(s): Not available   Brimonidine Tartrate Other (See Comments)    Irritates the eyes and increased pressure   Tetracycline Hcl Other (See Comments)    Doesn't remember reaction-"like 55 years ago"    Ciprofloxacin Other (See Comments)    Constipation    ASSESSMENT:  Patient was recently discharged from Hospital and all medications have been reviewed.  Date Discharged from Hospital: 07/31/2022  Date Medication Reconciliation Performed: 08/29/2022   Current Outpatient Medications:    acetaminophen (TYLENOL) 500 MG tablet, Take 500 mg by mouth every 8 (eight) hours as needed for mild pain or headache., Disp: , Rfl:    APIXABAN (ELIQUIS) VTE STARTER PACK ('10MG'$  AND '5MG'$ ), Take as directed on package: start with two-'5mg'$  tablets twice daily for 7 days. On day 8, switch to one-'5mg'$  tablet twice daily., Disp: 1 each, Rfl: 0   Apoaequorin (PREVAGEN EXTRA STRENGTH) 20 MG CAPS, Take 1 capsule by mouth daily., Disp: , Rfl:    atorvastatin (LIPITOR) 20 MG tablet, Take 1 tablet (20 mg total) by mouth daily. (Patient taking differently: Take 20 mg by mouth at bedtime.),  Disp: 30 tablet, Rfl: 0   Cholecalciferol 25 MCG (1000 UT) capsule, Take 1,000 Units by mouth daily., Disp: , Rfl:    clopidogrel (PLAVIX) 75 MG tablet, Take 1 tablet by mouth daily., Disp: , Rfl:    Coenzyme Q-10 200 MG CAPS, Take 1 capsule by mouth daily., Disp: , Rfl:    COSOPT PF 2-0.5 % SOLN ophthalmic solution, Place 1 drop into both eyes in the morning and at bedtime., Disp: , Rfl:    CRANBERRY PO, Take 1 tablet by mouth daily., Disp: , Rfl:    cyanocobalamin (VITAMIN B12) 1000 MCG tablet, Take 500 mcg by mouth daily., Disp: , Rfl:    Deanol (DIMETHYLAMINOETHANOL) LIQD, by Does not apply route., Disp: , Rfl:    DILT-XR 240 MG 24 hr capsule, TAKE ONE CAPSULE BY MOUTH ONCE DAILY (Patient taking differently: Take 240 mg by mouth at bedtime.), Disp: 90 capsule, Rfl: 3   esomeprazole (NEXIUM) 20 MG capsule, Take 20 mg by mouth 2 (two) times daily before a meal., Disp: , Rfl:    Glucosamine-Chondroitin (MOVE FREE PO), Take 1 tablet by mouth daily., Disp: , Rfl:    losartan-hydrochlorothiazide (HYZAAR) 100-25 MG tablet, TAKE ONE TABLET BY MOUTH ONCE DAILY, Disp: 90 tablet, Rfl: 2   Melatonin 3 MG CAPS, Take 2 capsules by mouth at bedtime. 6 mg nightly, Disp: , Rfl:    Misc Natural Products (PROSTATE HEALTH) CAPS, Take 1 capsule by mouth daily., Disp: ,  Rfl:    Multiple Vitamins-Minerals (ADULT GUMMY PO), Take 2 capsules by mouth daily., Disp: , Rfl:    Multiple Vitamins-Minerals (PRESERVISION AREDS 2+MULTI VIT) CAPS, Take 1 capsule by mouth 2 times daily at 12 noon and 4 pm., Disp: , Rfl:    NON FORMULARY, Pt uses a cpap nightly, Disp: , Rfl:    Omega-3 Fatty Acids (FISH OIL) 1200 MG CAPS, Take 1 capsule by mouth daily., Disp: , Rfl:    OVER THE COUNTER MEDICATION, Take 1 tablet by mouth daily. Focus Factor, Disp: , Rfl:    OVER THE COUNTER MEDICATION, Take 1 capsule by mouth daily. Saffron 28 mg, Disp: , Rfl:    Probiotic Product (ALIGN PO), Take 1 capsule by mouth in the morning., Disp: , Rfl:     RHOPRESSA 0.02 % SOLN, Place 1 drop into both eyes at bedtime. , Disp: , Rfl:    tadalafil (CIALIS) 5 MG tablet, Take 5 mg by mouth daily., Disp: , Rfl:    tamsulosin (FLOMAX) 0.4 MG CAPS capsule, Take 1 capsule (0.4 mg total) by mouth daily., Disp: 30 capsule, Rfl: 0   Zinc 25 MG TABS, Take 1 tablet by mouth daily., Disp: , Rfl:    [START ON 09/24/2022] apixaban (ELIQUIS) 5 MG TABS tablet, Take 1 tablet (5 mg total) by mouth 2 (two) times daily. Start this prescription once you have finished with the starter pack. (Patient not taking: Reported on 08/29/2022), Disp: 60 tablet, Rfl: 5   docusate sodium (COLACE) 100 MG capsule, Take 1 capsule (100 mg total) by mouth 2 (two) times daily. (Patient not taking: Reported on 08/29/2022), Disp: , Rfl:    fluorouracil (EFUDEX) 5 % cream, Apply 1 Application topically at bedtime. (Patient not taking: Reported on 08/29/2022), Disp: , Rfl:    fluticasone (FLONASE) 50 MCG/ACT nasal spray, Place 2 sprays into both nostrils daily as needed for allergies or rhinitis. (Patient not taking: Reported on 08/29/2022), Disp: , Rfl:    nitroGLYCERIN (NITROSTAT) 0.4 MG SL tablet, Place 1 tablet (0.4 mg total) under the tongue every 5 (five) minutes as needed for chest pain. (Patient not taking: Reported on 08/29/2022), Disp: 10 tablet, Rfl: 1   traMADol (ULTRAM) 50 MG tablet, Take 1 to 2 tablets  by mouth every 6 hours as needed for moderate pain or severe pain. (Patient not taking: Reported on 08/29/2022), Disp: 20 tablet, Rfl: 0   valACYclovir (VALTREX) 1000 MG tablet, Take 1,000 mg by mouth 2 (two) times daily as needed (for outbreaks). (Patient not taking: Reported on 08/29/2022), Disp: , Rfl:    Medications Discontinued at Discharge:  Adult Gummy Chew  Chew 2 capsules by mouth daily. Cyanocobalamin Vitamin B 12 Take 1,000 mcg by mouth daily. cholecalciferol (VITAMIN D3) 25 MCG (1000 UNIT) Take 1,000 Units by mouth daily. Coenzyme Q10 (COQ10) 200 MG CAPS Take 200 mg by mouth  daily. Cranberry Take 1 tablet by mouth daily. Omega-3 Fatty Acids (FISH OIL) 1200 MG CAPS Take 1,200 mg by mouth in the morning and at bedtime. Multiple Vitamins-Minerals (LUTEIN-ZEAXANTHIN) TABS Take 1 tablet by mouth daily. Melatonin 3 MG CAPS Take 6 mg by mouth at bedtime. Glucosamine-Chondroitin (MOVE FREE PO) Take 1 tablet by mouth daily. Multiple Vitamins-Minerals (PRESERVISION AREDS 2 PO) Take 1 capsule by mouth in the morning and at bedtime.  Apoaequorin (PREVAGEN EXTRA STRENGTH) 20 MG CAPS Take 20 mg by mouth daily. Misc Natural Products (PROSTATE HEALTH) CAPS Take 1 capsule by mouth in the morning. Zinc 25 MG  TABS Take 25 mg by mouth daily  NOTE: Patient has resumed all medications listed above  New Medications at Discharge:  Docusate sodium 100 mg Take 1 capsule (100 mg) by mouth 2 (two) times daily.  Tramadol 50 mg Take 1 to 2 tablets by mouth every 6 hours as needed for moderate to severe pain NOTE: Patient did not start either medication listed above, used Miralax and Florastor instead of docusate, course is now complete per patient. Did not use tramadol, pain control with scheduled acetaminophen only.   Medications with Dose Adjustments at Discharge: None   Plan:   Will submit completed medication reconciliation post discharge to the [facility] team as well as PCP.   Loretha Brasil, PharmD [facility] Pharmacist Office: [phone]

## 2022-09-02 ENCOUNTER — Emergency Department (HOSPITAL_BASED_OUTPATIENT_CLINIC_OR_DEPARTMENT_OTHER)
Admission: EM | Admit: 2022-09-02 | Discharge: 2022-09-02 | Disposition: A | Discharge status: Home / Self Care | Payer: PPO | Attending: Emergency Medicine | Admitting: Emergency Medicine

## 2022-09-02 ENCOUNTER — Other Ambulatory Visit: Payer: Self-pay

## 2022-09-02 ENCOUNTER — Emergency Department (HOSPITAL_BASED_OUTPATIENT_CLINIC_OR_DEPARTMENT_OTHER): Payer: PPO

## 2022-09-02 ENCOUNTER — Encounter (HOSPITAL_BASED_OUTPATIENT_CLINIC_OR_DEPARTMENT_OTHER): Payer: Self-pay

## 2022-09-02 DIAGNOSIS — R0602 Shortness of breath: Secondary | ICD-10-CM | POA: Insufficient documentation

## 2022-09-02 DIAGNOSIS — Z79899 Other long term (current) drug therapy: Secondary | ICD-10-CM | POA: Insufficient documentation

## 2022-09-02 DIAGNOSIS — I4891 Unspecified atrial fibrillation: Secondary | ICD-10-CM | POA: Insufficient documentation

## 2022-09-02 DIAGNOSIS — I10 Essential (primary) hypertension: Secondary | ICD-10-CM | POA: Insufficient documentation

## 2022-09-02 DIAGNOSIS — Z7902 Long term (current) use of antithrombotics/antiplatelets: Secondary | ICD-10-CM | POA: Diagnosis not present

## 2022-09-02 DIAGNOSIS — Z20822 Contact with and (suspected) exposure to covid-19: Secondary | ICD-10-CM | POA: Insufficient documentation

## 2022-09-02 DIAGNOSIS — U071 COVID-19: Secondary | ICD-10-CM | POA: Diagnosis not present

## 2022-09-02 DIAGNOSIS — Z7901 Long term (current) use of anticoagulants: Secondary | ICD-10-CM | POA: Insufficient documentation

## 2022-09-02 LAB — RESP PANEL BY RT-PCR (FLU A&B, COVID) ARPGX2
Influenza A by PCR: NEGATIVE
Influenza B by PCR: NEGATIVE
SARS Coronavirus 2 by RT PCR: NEGATIVE

## 2022-09-02 NOTE — ED Triage Notes (Addendum)
Pt states that he exerted himself while walking upstairs to restroom. Pt states he was short of breath when he got to the sink. Pt describes as feeling "shaky". Pt states there has been a small amount of blood in his urine, but [facility] was not concerned, advised to drink more water, which he has.   Recent dx of DVT, bladder surgery.  PT does take eliquis and plavix.

## 2022-09-02 NOTE — Discharge Instructions (Signed)
COVID-19 and influenza PCR testing was negative.  Your chest x-ray showed no ruptured lung or evidence of pneumonia.  It is less likely that you have a blood clot in your lungs as you are currently already on Eliquis.  You are currently asymptomatic and stable for discharge.

## 2022-09-02 NOTE — ED Provider Notes (Signed)
[facility] EMERGENCY DEPT Provider Note   CSN: [id number] Arrival date & time: 09/02/22  1157     History {Add pertinent medical, surgical, social history, OB history to HPI:1} Chief Complaint  Patient presents with   Shortness of Breath    Jonathan Lane is a 81 y.o. male.   Shortness of Breath      Home Medications Prior to Admission medications   Medication Sig Start Date End Date Taking? Authorizing Provider  acetaminophen (TYLENOL) 500 MG tablet Take 500 mg by mouth every 8 (eight) hours as needed for mild pain or headache.    [provider]  apixaban (ELIQUIS) 5 MG TABS tablet Take 1 tablet (5 mg total) by mouth 2 (two) times daily. Start this prescription once you have finished with the starter pack. Patient not taking: Reported on 08/29/2022 09/24/22   Gabriel Earing, PA-C  APIXABAN Arne [place]) VTE STARTER PACK ('10MG'$  AND '5MG'$ ) Take as directed on package: start with two-'5mg'$  tablets twice daily for 7 days. On day 8, switch to one-'5mg'$  tablet twice daily. 08/25/22   Rhyne, Hulen Shouts, PA-C  Apoaequorin (PREVAGEN EXTRA STRENGTH) 20 MG CAPS Take 1 capsule by mouth daily.    [provider]  atorvastatin (LIPITOR) 20 MG tablet Take 1 tablet (20 mg total) by mouth daily. Patient taking differently: Take 20 mg by mouth at bedtime. 08/26/19   Domenic Polite, MD  Cholecalciferol 25 MCG (1000 UT) capsule Take 1,000 Units by mouth daily.    [provider]  clopidogrel (PLAVIX) 75 MG tablet Take 1 tablet by mouth daily.    [provider]  Coenzyme Q-10 200 MG CAPS Take 1 capsule by mouth daily.    [provider]  COSOPT PF 2-0.5 % SOLN ophthalmic solution Place 1 drop into both eyes in the morning and at bedtime.    [provider]  CRANBERRY PO Take 1 tablet by mouth daily.    [provider]  cyanocobalamin (VITAMIN B12) 1000 MCG tablet Take 500 mcg by mouth daily.    [provider]  Deanol  (DIMETHYLAMINOETHANOL) LIQD by Does not apply route.    [provider]  DILT-XR 240 MG 24 hr capsule TAKE ONE CAPSULE BY MOUTH ONCE DAILY Patient taking differently: Take 240 mg by mouth at bedtime. 12/16/21   Josue Hector, MD  docusate sodium (COLACE) 100 MG capsule Take 1 capsule (100 mg total) by mouth 2 (two) times daily. Patient not taking: Reported on 08/29/2022 07/26/22   Debbrah Alar, PA-C  esomeprazole (NEXIUM) 20 MG capsule Take 20 mg by mouth 2 (two) times daily before a meal.    [provider]  fluorouracil (EFUDEX) 5 % cream Apply 1 Application topically at bedtime. 07/10/22   [provider]  fluticasone (FLONASE) 50 MCG/ACT nasal spray Place 2 sprays into both nostrils daily as needed for allergies or rhinitis. Patient not taking: Reported on 08/29/2022    [provider]  Glucosamine-Chondroitin (MOVE FREE PO) Take 1 tablet by mouth daily.    [provider]  losartan-hydrochlorothiazide (HYZAAR) 100-25 MG tablet TAKE ONE TABLET BY MOUTH ONCE DAILY 03/08/22   Josue Hector, MD  Melatonin 3 MG CAPS Take 2 capsules by mouth at bedtime. 6 mg nightly    [provider]  Misc Natural Products (PROSTATE HEALTH) CAPS Take 1 capsule by mouth daily.    [provider]  Multiple Vitamins-Minerals (ADULT GUMMY PO) Take 2 capsules by mouth daily.  [provider]  Multiple Vitamins-Minerals (PRESERVISION AREDS 2+MULTI VIT) CAPS Take 1 capsule by mouth 2 times daily at 12 noon and 4 pm.    [provider]  nitroGLYCERIN (NITROSTAT) 0.4 MG SL tablet Place 1 tablet (0.4 mg total) under the tongue every 5 (five) minutes as needed for chest pain. Patient not taking: Reported on 08/29/2022 08/18/19   Regalado, Jerald Kief A, MD  NON FORMULARY Pt uses a cpap nightly    [provider]  Omega-3 Fatty Acids (FISH OIL) 1200 MG CAPS Take 1 capsule by mouth daily.    [provider]  OVER THE COUNTER MEDICATION  Take 1 tablet by mouth daily. Focus Factor    [provider]  OVER THE COUNTER MEDICATION Take 1 capsule by mouth daily. Saffron 28 mg    [provider]  Probiotic Product (ALIGN PO) Take 1 capsule by mouth in the morning.    [provider]  RHOPRESSA 0.02 % SOLN Place 1 drop into both eyes at bedtime.  01/08/18   [provider]  tadalafil (CIALIS) 5 MG tablet Take 5 mg by mouth daily.    [provider]  tamsulosin (FLOMAX) 0.4 MG CAPS capsule Take 1 capsule (0.4 mg total) by mouth daily. 08/08/22   Ardis Hughs, MD  traMADol (ULTRAM) 50 MG tablet Take 1 to 2 tablets  by mouth every 6 hours as needed for moderate pain or severe pain. Patient not taking: Reported on 08/29/2022 07/26/22   Debbrah Alar, PA-C  valACYclovir (VALTREX) 1000 MG tablet Take 1,000 mg by mouth 2 (two) times daily as needed (for outbreaks). Patient not taking: Reported on 08/29/2022    [provider]  Zinc 25 MG TABS Take 1 tablet by mouth daily.    [provider]      Allergies    Brimonidine tartrate-timolol, Brimonidine tartrate, Tetracycline hcl, and Ciprofloxacin    Review of Systems   Review of Systems  Respiratory:  Positive for shortness of breath.     Physical Exam Updated Vital Signs BP (!) 145/67   Pulse (!) 59   Temp 98.4 F (36.9 C) (Oral)   Resp 16   Ht '6\' 2"'$  (1.88 m)   Wt 86.2 kg   SpO2 97%   BMI 24.39 kg/m  Physical Exam  ED Results / Procedures / Treatments   Labs (all labs ordered are listed, but only abnormal results are displayed) Labs Reviewed  RESP PANEL BY RT-PCR (FLU A&B, COVID) ARPGX2    EKG None  Radiology DG Chest Portable 1 View  Result Date: 09/02/2022 CLINICAL DATA:  Shortness of breath.  COVID positive. EXAM: PORTABLE CHEST 1 VIEW COMPARISON:  Chest x-ray dated December 18, 2021. FINDINGS: The heart size and mediastinal contours are within normal limits. Both lungs are clear. The visualized  skeletal structures are unremarkable. IMPRESSION: No active disease. Electronically Signed   By: Titus Dubin M.D.   On: 09/02/2022 14:25    Procedures Procedures  {Document cardiac monitor, telemetry assessment procedure when appropriate:1}  Medications Ordered in ED Medications - No data to display  ED Course/ Medical Decision Making/ A&P                           Medical Decision Making Amount and/or Complexity of Data Reviewed Radiology: ordered.   ***  {Document critical care time when appropriate:1} {Document review of labs and clinical decision tools ie heart score, Chads2Vasc2 etc:1}  {  Document your independent review of radiology images, and any outside records:1} {Document your discussion with family members, caretakers, and with consultants:1} {Document social determinants of health affecting pt's care:1} {Document your decision making why or why not admission, treatments were needed:1} Final Clinical Impression(s) / ED Diagnoses Final diagnoses:  None    Rx / DC Orders ED Discharge Orders     None

## 2022-09-02 NOTE — ED Notes (Signed)
Pt. States he was in the grocery store and felt shaky. He alos states girlfriend tested positive fo covid this week.

## 2022-09-05 DIAGNOSIS — K219 Gastro-esophageal reflux disease without esophagitis: Secondary | ICD-10-CM | POA: Diagnosis not present

## 2022-09-05 DIAGNOSIS — N400 Enlarged prostate without lower urinary tract symptoms: Secondary | ICD-10-CM | POA: Diagnosis not present

## 2022-09-05 DIAGNOSIS — I10 Essential (primary) hypertension: Secondary | ICD-10-CM | POA: Diagnosis not present

## 2022-09-05 DIAGNOSIS — C670 Malignant neoplasm of trigone of bladder: Secondary | ICD-10-CM | POA: Diagnosis not present

## 2022-09-05 DIAGNOSIS — C662 Malignant neoplasm of left ureter: Secondary | ICD-10-CM | POA: Diagnosis not present

## 2022-09-05 DIAGNOSIS — E782 Mixed hyperlipidemia: Secondary | ICD-10-CM | POA: Diagnosis not present

## 2022-09-07 DIAGNOSIS — H6123 Impacted cerumen, bilateral: Secondary | ICD-10-CM | POA: Diagnosis not present

## 2022-09-08 DIAGNOSIS — L570 Actinic keratosis: Secondary | ICD-10-CM | POA: Diagnosis not present

## 2022-09-08 DIAGNOSIS — D099 Carcinoma in situ, unspecified: Secondary | ICD-10-CM | POA: Diagnosis not present

## 2022-09-15 DIAGNOSIS — H6123 Impacted cerumen, bilateral: Secondary | ICD-10-CM | POA: Diagnosis not present

## 2022-09-19 DIAGNOSIS — H40003 Preglaucoma, unspecified, bilateral: Secondary | ICD-10-CM | POA: Diagnosis not present

## 2022-09-19 DIAGNOSIS — Z961 Presence of intraocular lens: Secondary | ICD-10-CM | POA: Diagnosis not present

## 2022-09-19 DIAGNOSIS — H353230 Exudative age-related macular degeneration, bilateral, stage unspecified: Secondary | ICD-10-CM | POA: Diagnosis not present

## 2022-09-27 DIAGNOSIS — H353222 Exudative age-related macular degeneration, left eye, with inactive choroidal neovascularization: Secondary | ICD-10-CM | POA: Diagnosis not present

## 2022-09-27 DIAGNOSIS — H353211 Exudative age-related macular degeneration, right eye, with active choroidal neovascularization: Secondary | ICD-10-CM | POA: Diagnosis not present

## 2022-09-27 DIAGNOSIS — H35371 Puckering of macula, right eye: Secondary | ICD-10-CM | POA: Diagnosis not present

## 2022-09-27 DIAGNOSIS — H43813 Vitreous degeneration, bilateral: Secondary | ICD-10-CM | POA: Diagnosis not present

## 2022-09-28 DIAGNOSIS — L821 Other seborrheic keratosis: Secondary | ICD-10-CM | POA: Diagnosis not present

## 2022-09-28 DIAGNOSIS — D224 Melanocytic nevi of scalp and neck: Secondary | ICD-10-CM | POA: Diagnosis not present

## 2022-09-28 DIAGNOSIS — L578 Other skin changes due to chronic exposure to nonionizing radiation: Secondary | ICD-10-CM | POA: Diagnosis not present

## 2022-09-28 DIAGNOSIS — D2262 Melanocytic nevi of left upper limb, including shoulder: Secondary | ICD-10-CM | POA: Diagnosis not present

## 2022-09-28 DIAGNOSIS — Z86018 Personal history of other benign neoplasm: Secondary | ICD-10-CM | POA: Diagnosis not present

## 2022-09-28 DIAGNOSIS — D225 Melanocytic nevi of trunk: Secondary | ICD-10-CM | POA: Diagnosis not present

## 2022-09-28 DIAGNOSIS — Z85828 Personal history of other malignant neoplasm of skin: Secondary | ICD-10-CM | POA: Diagnosis not present

## 2022-09-28 DIAGNOSIS — D485 Neoplasm of uncertain behavior of skin: Secondary | ICD-10-CM | POA: Diagnosis not present

## 2022-09-28 DIAGNOSIS — L570 Actinic keratosis: Secondary | ICD-10-CM | POA: Diagnosis not present

## 2022-10-07 ENCOUNTER — Other Ambulatory Visit (HOSPITAL_COMMUNITY): Payer: Self-pay

## 2022-11-08 DIAGNOSIS — L570 Actinic keratosis: Secondary | ICD-10-CM | POA: Diagnosis not present

## 2022-11-08 DIAGNOSIS — I10 Essential (primary) hypertension: Secondary | ICD-10-CM | POA: Diagnosis not present

## 2022-11-08 DIAGNOSIS — E782 Mixed hyperlipidemia: Secondary | ICD-10-CM | POA: Diagnosis not present

## 2022-11-08 DIAGNOSIS — N400 Enlarged prostate without lower urinary tract symptoms: Secondary | ICD-10-CM | POA: Diagnosis not present

## 2022-11-08 DIAGNOSIS — Z8589 Personal history of malignant neoplasm of other organs and systems: Secondary | ICD-10-CM | POA: Diagnosis not present

## 2022-11-08 DIAGNOSIS — T50905A Adverse effect of unspecified drugs, medicaments and biological substances, initial encounter: Secondary | ICD-10-CM | POA: Diagnosis not present

## 2022-11-08 DIAGNOSIS — K219 Gastro-esophageal reflux disease without esophagitis: Secondary | ICD-10-CM | POA: Diagnosis not present

## 2022-11-20 ENCOUNTER — Other Ambulatory Visit (HOSPITAL_COMMUNITY): Payer: Self-pay

## 2022-11-23 ENCOUNTER — Ambulatory Visit: Payer: PPO | Admitting: Nurse Practitioner

## 2022-11-24 ENCOUNTER — Ambulatory Visit: Payer: PPO | Attending: Nurse Practitioner | Admitting: Nurse Practitioner

## 2022-11-24 ENCOUNTER — Encounter: Payer: Self-pay | Admitting: Nurse Practitioner

## 2022-11-24 VITALS — BP 130/66 | HR 56 | Ht 74.0 in | Wt 195.0 lb

## 2022-11-24 DIAGNOSIS — I10 Essential (primary) hypertension: Secondary | ICD-10-CM

## 2022-11-24 DIAGNOSIS — Z86718 Personal history of other venous thrombosis and embolism: Secondary | ICD-10-CM | POA: Diagnosis not present

## 2022-11-24 DIAGNOSIS — I723 Aneurysm of iliac artery: Secondary | ICD-10-CM | POA: Diagnosis not present

## 2022-11-24 DIAGNOSIS — R002 Palpitations: Secondary | ICD-10-CM | POA: Diagnosis not present

## 2022-11-24 DIAGNOSIS — I359 Nonrheumatic aortic valve disorder, unspecified: Secondary | ICD-10-CM

## 2022-11-24 NOTE — Progress Notes (Signed)
Cardiology Office Note:    Date:  11/24/2022   ID:  Jonathan Lane, DOB May 30, 1941, MRN [id number]  PCP:  Glenis Smoker, MD   [facility] Providers Cardiologist:  [name], MD     Referring MD: Glenis Smoker, *   Chief Complaint: follow-up chest pain, palpitations, hypertension  History of Present Illness:    Jonathan Lane is a very pleasant 81 y.o. male with a hx of atypical chest pain, HTN, bradycardia, OSA with CPAP, palpitations, and bladder cancer.   He was seen by cardiology in consultation during hospitalization 08/18/2019 for evaluation of chest pain.  His wife had passed away 3 days prior and he was under a lot of stress.  Wife was admitted with sepsis, he was not happy with her care and felt she was transferred to ICU too late. No acute findings on his exam.  Previous myoview 03/26/18 was normal.   He then presented 08/25/2019 with left-sided facial numbness and left upper arm numbness that began on the morning of presentation.  Was seen by neurology and found to have TIA.  MRI was normal.  CTA with vertebral disease in distal left V4 segment proximal to VB junction. Dr. Erlinda Hong recommended ASA and Plavix for 3 weeks followed by Plavix alone. Lipitor was increased to 20 mg daily. Also with recommendations for 30-day event monitor which revealed no significant arrhythmias. TTE 08/18/2019 with EF > 65%, no significant valve disease.  Sees Dr. 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 Chest/abdomen/pelvis CTA 08/18/2019 with no dissection or other acute aortic changes.  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  F/U Dr. Scot Dock who indicated would not operate unless reached 3.5 cm, US done 03/19/2021 iliacs only 1.2 to 1.4 cm.  Some stenosis in R CIA peak velocity 3.8 m/sec. 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  Evidence of DVT 08/2022 on imaging in ED. Seen by [facility] 08/25/2022.  Partial thrombus in the mid FV, partial thrombus in the popliteal vein LLE, felt to be provoked given recent surgical history for cancer. Started on anticoagulation for 3 to 6 months, indefinitely if return of thrombus.   Last cardiology clinic visit was with Dr. Johnsie Cancel on 11/15/21 at which time no changes were made to treatment regimen and one year follow-up was recommended.  Today, he is here alone for follow-up. Got through urological surgery easier than he anticipated. Continues to take Eliquis with plan to continue for 6 months for provoked DVT. Was sitting a great deal prior to surgery working on tax returns and thinks this contributed to DVT. No specific cardiac complaints. Is very analytical, likes to think about why things have occurred and why he has certain diagnoses. No further episodes of chest pain or palpitations. He denies shortness of breath, orthopnea, PND, edema, presyncope, syncope. No bleeding problems. Resumed walking daily, no activity intolerance walking 2 miles.  Hydrates well and tries to limit sodium intake. No specific cardiac complaints.   Past Medical History:  Diagnosis Date   AAA (abdominal aortic aneurysm) (HCC)    Atrial fibrillation (HCC)    Bradycardia    Bradycardia    Cancer (HCC)    Cardiac murmur    Dizziness    Dysrhythmia  GERD (gastroesophageal reflux disease)    Glaucoma    Heart palpitations    Hyperlipidemia    Hypertension    Numbness 08/26/2019   LEFT FACE   Pneumonia    Skin abnormalities    pre cancerous lesion R hand   Sleep apnea    uses cpap   Snoring     Past Surgical History:  Procedure Laterality Date   APPENDECTOMY  12/19/1963   COLONOSCOPY     CYSTOSCOPY WITH BIOPSY N/A 05/16/2022   Procedure: CYSTOSCOPY WITH BIOPSY;  Surgeon: [name], MD;  Location: [facility];   Service: Urology;  Laterality: N/A;   CYSTOSCOPY WITH RETROGRADE PYELOGRAM, URETEROSCOPY AND STENT PLACEMENT Bilateral 05/16/2022   Procedure: CYSTOSCOPY WITH BILATERAL RETROGRADE PYELOGRAM, LEFT URETEROSCOPY WITH BIOPSY, HOLMIUM LASER OF LEFT URETERAL STONE  AND LEFT STENT EXCHANGE, TUR LEFT URETERAL ORIFACE;  Surgeon: [name], MD;  Location: [facility];  Service: Urology;  Laterality: Bilateral;  66 MINUTES   CYSTOSCOPY WITH RETROGRADE PYELOGRAM, URETEROSCOPY AND STENT PLACEMENT Left 07/26/2022   Procedure: CYSTOSCOPY WITH RETROGRADE PYELOGRAM, URETEROSCOPY AND STENT EXCHANGE;  Surgeon: Alexis Frock, MD;  Location: [facility];  Service: Urology;  Laterality: Left;   LYMPH NODE DISSECTION Bilateral 07/26/2022   Procedure: LYMPH NODE DISSECTION;  Surgeon: Alexis Frock, MD;  Location: [facility];  Service: Urology;  Laterality: Bilateral;   POLYPECTOMY     SKIN CANCER EXCISION     TONSILLECTOMY     as a child x2   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   Procedure: RESTAGING TRANSURETHRAL RESECTION OF BLADDER TUMOR  (TURBT) LEFT URETEROSCOPY WITH LASER LEFT STENT EXCHANGE RIGHT STENT REMOVAL;  Surgeon: [name], MD;  Location: [facility];  Service: Urology;  Laterality: Bilateral;   TRANSURETHRAL RESECTION OF BLADDER TUMOR WITH MITOMYCIN-C Bilateral 01/03/2022   Procedure: CYSTOSCOPY TRANSURETHRAL RESECTION OF BLADDER TUMOR WITH POSSIBLE  BILATERAL RETROGRADES STENT PLACEMENT;  Surgeon: [name], MD;  Location: [facility];  Service: Urology;  Laterality: Bilateral;   WISDOM TOOTH EXTRACTION      Current Medications: Current Meds  Medication Sig   acetaminophen (TYLENOL) 500 MG tablet Take 500 mg by mouth every 8 (eight) hours as needed for mild pain or headache.   apixaban (ELIQUIS) 5 MG TABS tablet Take 1 tablet (5 mg total) by mouth 2 (two) times daily. Start this prescription once you have finished with the starter pack.   APIXABAN (ELIQUIS) VTE STARTER PACK ('10MG'$  AND '5MG'$ )  Take as directed on package: start with two-'5mg'$  tablets twice daily for 7 days. On day 8, switch to one-'5mg'$  tablet twice daily.   Apoaequorin (PREVAGEN EXTRA STRENGTH) 20 MG CAPS Take 1 capsule by mouth daily.   atorvastatin (LIPITOR) 20 MG tablet Take 1 tablet (20 mg total) by mouth daily. (Patient taking differently: Take 20 mg by mouth at bedtime.)   Cholecalciferol 25 MCG (1000 UT) capsule Take 1,000 Units by mouth daily.   clopidogrel (PLAVIX) 75 MG tablet Take 1 tablet by mouth daily.   Coenzyme Q-10 200 MG CAPS Take 1 capsule by mouth daily.   COSOPT PF 2-0.5 % SOLN ophthalmic solution Place 1 drop into both eyes in the morning and at bedtime.   CRANBERRY PO Take 1 tablet by mouth daily.   cyanocobalamin (VITAMIN B12) 1000 MCG tablet Take 500 mcg by mouth daily.   Deanol (DIMETHYLAMINOETHANOL) LIQD by Does not apply route.   DILT-XR 240 MG 24 hr capsule TAKE ONE CAPSULE BY MOUTH ONCE DAILY (Patient taking differently:  Take 240 mg by mouth at bedtime.)   docusate sodium (COLACE) 100 MG capsule Take 1 capsule (100 mg total) by mouth 2 (two) times daily.   esomeprazole (NEXIUM) 20 MG capsule Take 20 mg by mouth 2 (two) times daily before a meal.   fluorouracil (EFUDEX) 5 % cream Apply 1 Application topically at bedtime.   fluticasone (FLONASE) 50 MCG/ACT nasal spray Place 2 sprays into both nostrils daily as needed for allergies or rhinitis.   Glucosamine-Chondroitin (MOVE FREE PO) Take 1 tablet by mouth daily.   losartan-hydrochlorothiazide (HYZAAR) 100-25 MG tablet TAKE ONE TABLET BY MOUTH ONCE DAILY   Melatonin 3 MG CAPS Take 2 capsules by mouth at bedtime. 6 mg nightly   Misc Natural Products (PROSTATE HEALTH) CAPS Take 1 capsule by mouth daily.   Multiple Vitamins-Minerals (ADULT GUMMY PO) Take 2 capsules by mouth daily.   Multiple Vitamins-Minerals (PRESERVISION AREDS 2+MULTI VIT) CAPS Take 1 capsule by mouth 2 times daily at 12 noon and 4 pm.   nitroGLYCERIN (NITROSTAT) 0.4 MG SL  tablet Place 1 tablet (0.4 mg total) under the tongue every 5 (five) minutes as needed for chest pain.   NON FORMULARY Pt uses a cpap nightly   Omega-3 Fatty Acids (FISH OIL) 1200 MG CAPS Take 1 capsule by mouth daily.   OVER THE COUNTER MEDICATION Take 1 tablet by mouth daily. Focus Factor   OVER THE COUNTER MEDICATION Take 1 capsule by mouth daily. Saffron 28 mg   Probiotic Product (ALIGN PO) Take 1 capsule by mouth in the morning.   RHOPRESSA 0.02 % SOLN Place 1 drop into both eyes at bedtime.    tadalafil (CIALIS) 5 MG tablet Take 5 mg by mouth daily.   tamsulosin (FLOMAX) 0.4 MG CAPS capsule Take 1 capsule (0.4 mg total) by mouth daily.   valACYclovir (VALTREX) 1000 MG tablet Take 1,000 mg by mouth 2 (two) times daily as needed (for outbreaks).   Zinc 25 MG TABS Take 1 tablet by mouth daily.     Allergies:   Brimonidine tartrate-timolol, Brimonidine tartrate, Tetracycline hcl, and Ciprofloxacin   Social History   Socioeconomic History   Marital status: Widowed    Spouse name: [name]   Number of children: 1   Years of education: Masters   Highest education level: Not on file  Occupational History    Employer: RETIRED  Tobacco Use   Smoking status: Former    Types: Cigarettes    Quit date: 12/18/1965    Years since quitting: 56.9    Passive exposure: Never   Smokeless tobacco: Never  Vaping Use   Vaping Use: Never used  Substance and Sexual Activity   Alcohol use: Not Currently    Comment: 5-7 drinks per week   Drug use: No   Sexual activity: Not on file  Other Topics Concern   Not on file  Social History Narrative   Patient is married [name]).   Patient is retired.   Patient has one adult child.   Patient does not drink any caffeine.   Patient is right-handed.   Patient has a Scientist, water quality.            Social Determinants of Health   Financial Resource Strain: Not on file  Food Insecurity: Not on file  Transportation Needs: Not on file  Physical Activity:  Not on file  Stress: Not on file  Social Connections: Not on file     Family History: The patient's family history includes Breast  cancer in his sister; Colon cancer in his paternal uncle and another family member; Diabetes in his sister; High blood pressure in his sister; Mitral valve prolapse in an other family member; Stroke in his father, mother, and sister. There is no history of Rectal cancer or Stomach cancer.  ROS:   Please see the history of present illness.  All other systems reviewed and are negative.  Labs/Other Studies Reviewed:    The following studies were reviewed today:  VAS LE DVT 08/25/22 RIGHT:  - No evidence of common femoral vein obstruction.    LEFT:  - Partial thrombus in the mid FV.  - Partial thrombus in the popliteal vein.  - No veins seen at the area of pain in the proximal anterior lateral  thigh.   - Limited imaging of the left EIV, CIV and the IVC demonstrates no obvious  thrombus.    *See table(s) above for measurements and observations.  Cardiac Monitor 10/28/19 NSR PAC/PVC;s rare No significant arrhythmias  Echo 08/18/19 1. The left ventricle has hyperdynamic systolic function, with an  ejection fraction of >65%. The cavity size was normal. There is moderately  increased left ventricular wall thickness. Left ventricular diastolic  Doppler parameters are consistent with  impaired relaxation.   2. The right ventricle has normal systolic function. The cavity was  normal. There is no increase in right ventricular wall thickness.   3. Mild calcification of the mitral valve leaflet. There is mild mitral  annular calcification present. No evidence of mitral valve stenosis. No  significant mitral regurgitation.   4. The aortic valve is tricuspid. Mild calcification of the aortic valve.  No stenosis of the aortic valve.   5. The inferior vena cava was dilated in size with <50% respiratory  variability. No complete TR doppler jet so unable to  estimate PA systolic  pressure.   6. The aortic root is normal in size and structure.   7. Trivial pericardial effusion is present.  Recent Labs: 12/18/2021: ALT 22 07/30/2022: Magnesium 2.0 08/08/2022: BUN 16; Creatinine, Ser 0.80; Hemoglobin 12.6; Platelets 263; Potassium 3.6; Sodium 137  Recent Lipid Panel    Component Value Date/Time   CHOL 123 08/18/2019 0550   TRIG 54 08/18/2019 0550   HDL 37 (L) 08/18/2019 0550   CHOLHDL 3.3 08/18/2019 0550   VLDL 11 08/18/2019 0550   LDLCALC 75 08/18/2019 0550     Risk Assessment/Calculations:         Physical Exam:    VS:  BP 130/66   Pulse (!) 56   Ht '6\' 2"'$  (1.88 m)   Wt 195 lb (88.5 kg)   SpO2 96%   BMI 25.04 kg/m     Wt Readings from Last 3 Encounters:  11/24/22 195 lb (88.5 kg)  09/02/22 190 lb (86.2 kg)  08/25/22 188 lb (85.3 kg)     GEN: Well nourished, well developed in no acute distress HEENT: Normal NECK: No JVD; No carotid bruits CARDIAC: RRR, 3/6 systolic murmur. No rubs, gallops RESPIRATORY:  Clear to auscultation without rales, wheezing or rhonchi  ABDOMEN: Soft, non-tender, non-distended MUSCULOSKELETAL:  No edema; No deformity. 2+ pedal pulses, equal bilaterally SKIN: Warm and dry NEUROLOGIC:  Alert and oriented x 3 PSYCHIATRIC:  Normal affect   EKG:  EKG is not ordered today   Diagnoses:    1. Aortic valve disorder   2. Palpitations   3. Essential hypertension   4. History of DVT of lower extremity    Assessment and  Plan:     Valve disease: 3/6 systolic murmur on exam today.  Prior echo 07/2019 revealed mild calcification of the mitral valve, no stenosis or regurgitation.  Mild calcification of the aortic valve without stenosis. He is asymptomatic. We will get echocardiogram to evaluate for structural heart disease.   Hypertension: BP initially elevated but improved upon my recheck.  No medication changes today.  TIA: Facial and left upper arm numbness 08/2019. Imaging was negative for stroke. He  continues to take Plavix daily. No bleeding concerns.   History of DVT: Left thigh DVT noted on u/s 08/25/22, provoked from recent bladder CA and subsequent surgery. Was sedentary prior to surgery.  Plan is to continue Eliquis for total of 6 months. No bleeding problems. Management per PCP.   Iliac artery aneurysm: Is followed by [facility] for right CIA aneurysm with stable imaging 05/2022. Was advised to follow-up in 2 years.     Disposition: 1 year with Dr. Johnsie Cancel  Medication Adjustments/Labs and Tests Ordered: Current medicines are reviewed at length with the patient today.  Concerns regarding medicines are outlined above.  Orders Placed This Encounter  Procedures   ECHOCARDIOGRAM COMPLETE   No orders of the defined types were placed in this encounter.   Patient Instructions  Medication Instructions:   Your physician recommends that you continue on your current medications as directed. Please refer to the Current Medication list given to you today.   *If you need a refill on your cardiac medications before your next appointment, please call your pharmacy*   Lab Work:  None ordered.  If you have labs (blood work) drawn today and your tests are completely normal, you will receive your results only by: [place] (if you have MyChart) OR A paper copy in the mail If you have any lab test that is abnormal or we need to change your treatment, we will call you to review the results.   Testing/Procedures:  Your physician has requested that you have an echocardiogram. Echocardiography is a painless test that uses sound waves to create images of your heart. It provides your doctor with information about the size and shape of your heart and how well your heart's chambers and valves are working. This procedure takes approximately one hour. There are no restrictions for this procedure. Please do NOT wear cologne, perfume, aftershave, or lotions (deodorant is allowed). Please arrive 15  minutes prior to your appointment time.    Follow-Up: At [facility], you and your health needs are our priority.  As part of our continuing mission to provide you with exceptional heart care, we have created designated Provider Care Teams.  These Care Teams include your primary Cardiologist (physician) and Advanced Practice Providers (APPs -  Physician Assistants and Nurse Practitioners) who all work together to provide you with the care you need, when you need it.  We recommend signing up for the patient portal called "MyChart".  Sign up information is provided on this After Visit Summary.  MyChart is used to connect with patients for Virtual Visits (Telemedicine).  Patients are able to view lab/test results, encounter notes, upcoming appointments, etc.  Non-urgent messages can be sent to your provider as well.   To learn more about what you can do with MyChart, go to [email or web address].    Your next appointment:   1 year(s)  The format for your next appointment:   In Person  Provider:   [name], MD     Other Instructions  Your  physician wants you to follow-up in: 1 year with Dr. Johnsie Cancel.  You will receive a reminder letter in the mail two months in advance. If you don't receive a letter, please call our office to schedule the follow-up appointment.   Important Information About Sugar         Signed, Emmaline Life, NP  11/24/2022 2:13 PM    [place]

## 2022-11-24 NOTE — Patient Instructions (Signed)
Medication Instructions:   Your physician recommends that you continue on your current medications as directed. Please refer to the Current Medication list given to you today.   *If you need a refill on your cardiac medications before your next appointment, please call your pharmacy*   Lab Work:  None ordered.  If you have labs (blood work) drawn today and your tests are completely normal, you will receive your results only by: [facility] [place] (if you have MyChart) OR A paper copy in the mail If you have any lab test that is abnormal or we need to change your treatment, we will call you to review the results.   Testing/Procedures:  Your physician has requested that you have an echocardiogram. Echocardiography is a painless test that uses sound waves to create images of your heart. It provides your doctor with information about the size and shape of your heart and how well your heart's chambers and valves are working. This procedure takes approximately one hour. There are no restrictions for this procedure. Please do NOT wear cologne, perfume, aftershave, or lotions (deodorant is allowed). Please arrive 15 minutes prior to your appointment time.    Follow-Up: At [facility], you and your health needs are our priority.  As part of our continuing mission to provide you with exceptional heart care, we have created designated Provider Care Teams.  These Care Teams include your primary Cardiologist (physician) and Advanced Practice Providers (APPs -  Physician Assistants and Nurse Practitioners) who all work together to provide you with the care you need, when you need it.  We recommend signing up for the patient portal called "MyChart".  Sign up information is provided on this After Visit Summary.  MyChart is used to connect with patients for Virtual Visits (Telemedicine).  Patients are able to view lab/test results, encounter notes, upcoming appointments, etc.  Non-urgent messages  can be sent to your provider as well.   To learn more about what you can do with MyChart, go to [email or web address].    Your next appointment:   1 year(s)  The format for your next appointment:   In Person  Provider:   [name], MD     Other Instructions  Your physician wants you to follow-up in: 1 year with Dr. Johnsie Cancel.  You will receive a reminder letter in the mail two months in advance. If you don't receive a letter, please call our office to schedule the follow-up appointment.   Important Information About Sugar

## 2022-11-27 ENCOUNTER — Other Ambulatory Visit: Payer: Self-pay | Admitting: Cardiovascular Disease

## 2022-12-04 DIAGNOSIS — N400 Enlarged prostate without lower urinary tract symptoms: Secondary | ICD-10-CM | POA: Diagnosis not present

## 2022-12-04 DIAGNOSIS — K219 Gastro-esophageal reflux disease without esophagitis: Secondary | ICD-10-CM | POA: Diagnosis not present

## 2022-12-04 DIAGNOSIS — E782 Mixed hyperlipidemia: Secondary | ICD-10-CM | POA: Diagnosis not present

## 2022-12-04 DIAGNOSIS — G4700 Insomnia, unspecified: Secondary | ICD-10-CM | POA: Diagnosis not present

## 2022-12-04 DIAGNOSIS — I10 Essential (primary) hypertension: Secondary | ICD-10-CM | POA: Diagnosis not present

## 2022-12-05 DIAGNOSIS — K219 Gastro-esophageal reflux disease without esophagitis: Secondary | ICD-10-CM | POA: Diagnosis not present

## 2022-12-05 DIAGNOSIS — H353223 Exudative age-related macular degeneration, left eye, with inactive scar: Secondary | ICD-10-CM | POA: Diagnosis not present

## 2022-12-05 DIAGNOSIS — H35371 Puckering of macula, right eye: Secondary | ICD-10-CM | POA: Diagnosis not present

## 2022-12-05 DIAGNOSIS — E782 Mixed hyperlipidemia: Secondary | ICD-10-CM | POA: Diagnosis not present

## 2022-12-05 DIAGNOSIS — H43813 Vitreous degeneration, bilateral: Secondary | ICD-10-CM | POA: Diagnosis not present

## 2022-12-05 DIAGNOSIS — Z0000 Encounter for general adult medical examination without abnormal findings: Secondary | ICD-10-CM | POA: Diagnosis not present

## 2022-12-05 DIAGNOSIS — N400 Enlarged prostate without lower urinary tract symptoms: Secondary | ICD-10-CM | POA: Diagnosis not present

## 2022-12-05 DIAGNOSIS — N529 Male erectile dysfunction, unspecified: Secondary | ICD-10-CM | POA: Diagnosis not present

## 2022-12-05 DIAGNOSIS — K5909 Other constipation: Secondary | ICD-10-CM | POA: Diagnosis not present

## 2022-12-05 DIAGNOSIS — I10 Essential (primary) hypertension: Secondary | ICD-10-CM | POA: Diagnosis not present

## 2022-12-05 DIAGNOSIS — R7309 Other abnormal glucose: Secondary | ICD-10-CM | POA: Diagnosis not present

## 2022-12-05 DIAGNOSIS — I700 Atherosclerosis of aorta: Secondary | ICD-10-CM | POA: Diagnosis not present

## 2022-12-05 DIAGNOSIS — I723 Aneurysm of iliac artery: Secondary | ICD-10-CM | POA: Diagnosis not present

## 2022-12-05 DIAGNOSIS — H353211 Exudative age-related macular degeneration, right eye, with active choroidal neovascularization: Secondary | ICD-10-CM | POA: Diagnosis not present

## 2022-12-07 DIAGNOSIS — I10 Essential (primary) hypertension: Secondary | ICD-10-CM | POA: Diagnosis not present

## 2022-12-07 DIAGNOSIS — R7309 Other abnormal glucose: Secondary | ICD-10-CM | POA: Diagnosis not present

## 2022-12-07 DIAGNOSIS — E782 Mixed hyperlipidemia: Secondary | ICD-10-CM | POA: Diagnosis not present

## 2022-12-18 ENCOUNTER — Other Ambulatory Visit: Payer: Self-pay | Admitting: Urology

## 2022-12-25 DIAGNOSIS — H903 Sensorineural hearing loss, bilateral: Secondary | ICD-10-CM | POA: Diagnosis not present

## 2022-12-25 DIAGNOSIS — H6123 Impacted cerumen, bilateral: Secondary | ICD-10-CM | POA: Diagnosis not present

## 2022-12-28 ENCOUNTER — Ambulatory Visit (HOSPITAL_COMMUNITY): Payer: PPO | Attending: Cardiology

## 2022-12-28 DIAGNOSIS — R002 Palpitations: Secondary | ICD-10-CM

## 2022-12-28 DIAGNOSIS — I10 Essential (primary) hypertension: Secondary | ICD-10-CM | POA: Diagnosis not present

## 2022-12-28 DIAGNOSIS — I359 Nonrheumatic aortic valve disorder, unspecified: Secondary | ICD-10-CM | POA: Diagnosis not present

## 2022-12-28 LAB — ECHOCARDIOGRAM COMPLETE
Area-P 1/2: 3.47 cm2
S' Lateral: 3.3 cm

## 2022-12-29 DIAGNOSIS — L578 Other skin changes due to chronic exposure to nonionizing radiation: Secondary | ICD-10-CM | POA: Diagnosis not present

## 2022-12-29 DIAGNOSIS — L739 Follicular disorder, unspecified: Secondary | ICD-10-CM | POA: Diagnosis not present

## 2022-12-29 DIAGNOSIS — L821 Other seborrheic keratosis: Secondary | ICD-10-CM | POA: Diagnosis not present

## 2022-12-29 DIAGNOSIS — D0430 Carcinoma in situ of skin of unspecified part of face: Secondary | ICD-10-CM | POA: Diagnosis not present

## 2022-12-29 DIAGNOSIS — L820 Inflamed seborrheic keratosis: Secondary | ICD-10-CM | POA: Diagnosis not present

## 2023-02-12 DIAGNOSIS — C662 Malignant neoplasm of left ureter: Secondary | ICD-10-CM | POA: Diagnosis not present

## 2023-02-13 DIAGNOSIS — H353223 Exudative age-related macular degeneration, left eye, with inactive scar: Secondary | ICD-10-CM | POA: Diagnosis not present

## 2023-02-13 DIAGNOSIS — H43813 Vitreous degeneration, bilateral: Secondary | ICD-10-CM | POA: Diagnosis not present

## 2023-02-13 DIAGNOSIS — H35371 Puckering of macula, right eye: Secondary | ICD-10-CM | POA: Diagnosis not present

## 2023-02-13 DIAGNOSIS — H353211 Exudative age-related macular degeneration, right eye, with active choroidal neovascularization: Secondary | ICD-10-CM | POA: Diagnosis not present

## 2023-02-19 DIAGNOSIS — C670 Malignant neoplasm of trigone of bladder: Secondary | ICD-10-CM | POA: Diagnosis not present

## 2023-02-19 DIAGNOSIS — K8020 Calculus of gallbladder without cholecystitis without obstruction: Secondary | ICD-10-CM | POA: Diagnosis not present

## 2023-02-19 DIAGNOSIS — N200 Calculus of kidney: Secondary | ICD-10-CM | POA: Diagnosis not present

## 2023-02-19 DIAGNOSIS — N281 Cyst of kidney, acquired: Secondary | ICD-10-CM | POA: Diagnosis not present

## 2023-02-26 DIAGNOSIS — C670 Malignant neoplasm of trigone of bladder: Secondary | ICD-10-CM | POA: Diagnosis not present

## 2023-02-26 DIAGNOSIS — C662 Malignant neoplasm of left ureter: Secondary | ICD-10-CM | POA: Diagnosis not present

## 2023-02-26 DIAGNOSIS — N200 Calculus of kidney: Secondary | ICD-10-CM | POA: Diagnosis not present

## 2023-02-27 ENCOUNTER — Other Ambulatory Visit: Payer: Self-pay | Admitting: Physician Assistant

## 2023-02-28 DIAGNOSIS — I10 Essential (primary) hypertension: Secondary | ICD-10-CM | POA: Diagnosis not present

## 2023-02-28 DIAGNOSIS — E782 Mixed hyperlipidemia: Secondary | ICD-10-CM | POA: Diagnosis not present

## 2023-02-28 DIAGNOSIS — K219 Gastro-esophageal reflux disease without esophagitis: Secondary | ICD-10-CM | POA: Diagnosis not present

## 2023-02-28 DIAGNOSIS — G4700 Insomnia, unspecified: Secondary | ICD-10-CM | POA: Diagnosis not present

## 2023-02-28 DIAGNOSIS — N400 Enlarged prostate without lower urinary tract symptoms: Secondary | ICD-10-CM | POA: Diagnosis not present

## 2023-03-01 ENCOUNTER — Other Ambulatory Visit: Payer: Self-pay

## 2023-03-01 MED ORDER — APIXABAN 5 MG PO TABS: ORAL_TABLET | ORAL | 5 refills | Status: DC

## 2023-03-01 NOTE — Telephone Encounter (Signed)
-----   Message from Karoline Caldwell, [place] sent at 03/01/2023  8:46 AM EDT ----- Regarding: RE: Eliquis Sure if he is tolerating the medication that is fine. It really is not going to change how his leg feels though  ----- Message ----- From: Gevena Mart, RN Sent: 03/01/2023   8:39 AM EDT To: Karoline Caldwell, PA-C Subject: RE: Eliquis                                    Hey, I called this pt and he states that he doesn't feel that his leg is 100% and wants to continue Eliquis for another couple of months. He is pretty adamant about it. Is this ok? Thanks, Glenard Haring, RN - Triage  ----- Message ----- From: Marguerita Merles, RN Sent: 02/28/2023   3:23 PM EDT To: Gevena Mart, RN Subject: FW: Eliquis                                    Hi Angel, can you call this Pt and let him know he can't d/c eliquis. His pcp called [place] to let us know he was wanting a refill. Thanks, Izora Gala ----- Message ----- From: Karoline Caldwell, PA-C Sent: 02/28/2023   3:16 PM EDT To: Marguerita Merles, RN Subject: RE: Eliquis                                    Yes I think if has been 6 months then he can discontinue his Eliquis. 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 Aldona Bar ordered 6 months worth in Sept. Can he stop taking it since it has been the 6 months? Thanks, Izora Gala

## 2023-03-12 ENCOUNTER — Encounter: Payer: Self-pay | Admitting: *Deleted

## 2023-03-12 NOTE — Progress Notes (Unsigned)
PATIENT: Jonathan Lane DOB: 1941/10/12  REASON FOR VISIT: follow up HISTORY FROM: patient  Virtual Visit via Video Note  I connected with Jonathan Lane on 03/13/23 at  1:15 PM EDT by a video enabled telemedicine application located remotely at [facility] and verified that I am speaking with the correct person using two identifiers who was located at their own home in [place]    I discussed the limitations of evaluation and management by telemedicine and the availability of in person appointments. The patient expressed understanding and agreed to proceed.   PATIENT: Jonathan Lane DOB: 1941-01-07  REASON FOR VISIT: follow up HISTORY FROM: patient  HISTORY OF PRESENT ILLNESS: Today 03/13/23:  Jonathan Lane is a 82 y.o. male with a history of OSA on CPAP. Returns today for follow-up.  Reports that the CPAP is working well for him.  He denies any new issues.  His download is below       REVIEW OF SYSTEMS: Out of a complete 14 system review of symptoms, the patient complains only of the following symptoms, and all other reviewed systems are negative.  ALLERGIES: Allergies  Allergen Reactions   Brimonidine Tartrate-Timolol Itching, Rash and Other (See Comments)    Itching eyes Other reaction(s): Not available   Brimonidine Tartrate Other (See Comments)    Irritates the eyes and increased pressure   Tetracycline Hcl Other (See Comments)    Doesn't remember reaction-"like 55 years ago"    Ciprofloxacin Other (See Comments)    Constipation    HOME MEDICATIONS: Outpatient Medications Prior to Visit  Medication Sig Dispense Refill   acetaminophen (TYLENOL) 500 MG tablet Take 500 mg by mouth every 8 (eight) hours as needed for mild pain or headache.     apixaban (ELIQUIS) 5 MG TABS tablet TAKE ONE TABLET BY MOUTH TWICE DAILY START WHEN FINISHED STARTER PACK 60 tablet 5   Apoaequorin (PREVAGEN EXTRA STRENGTH) 20 MG CAPS Take 1 capsule by  mouth daily.     atorvastatin (LIPITOR) 20 MG tablet Take 1 tablet (20 mg total) by mouth daily. (Patient taking differently: Take 20 mg by mouth at bedtime.) 30 tablet 0   Cholecalciferol 25 MCG (1000 UT) capsule Take 1,000 Units by mouth daily.     clopidogrel (PLAVIX) 75 MG tablet Take 1 tablet by mouth daily.     Coenzyme Q-10 200 MG CAPS Take 1 capsule by mouth daily.     COSOPT PF 2-0.5 % SOLN ophthalmic solution Place 1 drop into both eyes in the morning and at bedtime.     CRANBERRY PO Take 1 tablet by mouth daily.     cyanocobalamin (VITAMIN B12) 1000 MCG tablet Take 500 mcg by mouth daily.     Deanol (DIMETHYLAMINOETHANOL) LIQD by Does not apply route.     DILT-XR 240 MG 24 hr capsule TAKE ONE CAPSULE BY MOUTH ONCE DAILY 90 capsule 3   docusate sodium (COLACE) 100 MG capsule Take 1 capsule (100 mg total) by mouth 2 (two) times daily.     esomeprazole (NEXIUM) 20 MG capsule Take 20 mg by mouth 2 (two) times daily before a meal.     fluorouracil (EFUDEX) 5 % cream Apply 1 Application topically at bedtime.     fluticasone (FLONASE) 50 MCG/ACT nasal spray Place 2 sprays into both nostrils daily as needed for allergies or rhinitis.     Glucosamine-Chondroitin (MOVE FREE PO) Take 1 tablet by mouth daily.  losartan-hydrochlorothiazide (HYZAAR) 100-25 MG tablet TAKE ONE TABLET BY MOUTH ONCE DAILY 90 tablet 3   Melatonin 3 MG CAPS Take 2 capsules by mouth at bedtime. 6 mg nightly     Misc Natural Products (PROSTATE HEALTH) CAPS Take 1 capsule by mouth daily.     Multiple Vitamins-Minerals (ADULT GUMMY PO) Take 2 capsules by mouth daily.     Multiple Vitamins-Minerals (PRESERVISION AREDS 2+MULTI VIT) CAPS Take 1 capsule by mouth 2 times daily at 12 noon and 4 pm.     nitroGLYCERIN (NITROSTAT) 0.4 MG SL tablet Place 1 tablet (0.4 mg total) under the tongue every 5 (five) minutes as needed for chest pain. 10 tablet 1   NON FORMULARY Pt uses a cpap nightly     Omega-3 Fatty Acids (FISH OIL)  1200 MG CAPS Take 1 capsule by mouth daily.     OVER THE COUNTER MEDICATION Take 1 tablet by mouth daily. Focus Factor     OVER THE COUNTER MEDICATION Take 1 capsule by mouth daily. Saffron 28 mg     Probiotic Product (ALIGN PO) Take 1 capsule by mouth in the morning.     RHOPRESSA 0.02 % SOLN Place 1 drop into both eyes at bedtime.      tadalafil (CIALIS) 5 MG tablet Take 5 mg by mouth daily.     tamsulosin (FLOMAX) 0.4 MG CAPS capsule Take 1 capsule (0.4 mg total) by mouth daily. 30 capsule 0   valACYclovir (VALTREX) 1000 MG tablet Take 1,000 mg by mouth 2 (two) times daily as needed (for outbreaks).     Zinc 25 MG TABS Take 1 tablet by mouth daily.     No facility-administered medications prior to visit.    PAST MEDICAL HISTORY: Past Medical History:  Diagnosis Date   AAA (abdominal aortic aneurysm) (HCC)    Atrial fibrillation (HCC)    Bradycardia    Bradycardia    Cancer (HCC)    Cardiac murmur    Dizziness    Dysrhythmia    GERD (gastroesophageal reflux disease)    Glaucoma    Heart palpitations    Hyperlipidemia    Hypertension    Numbness 08/26/2019   LEFT FACE   Pneumonia    Skin abnormalities    pre cancerous lesion R hand   Sleep apnea    uses cpap   Snoring     PAST SURGICAL HISTORY: Past Surgical History:  Procedure Laterality Date   APPENDECTOMY  12/19/1963   COLONOSCOPY     CYSTOSCOPY WITH BIOPSY N/A 05/16/2022   Procedure: CYSTOSCOPY WITH BIOPSY;  Surgeon: Bjorn Pippin, MD;  Location: [facility];  Service: Urology;  Laterality: N/A;   CYSTOSCOPY WITH RETROGRADE PYELOGRAM, URETEROSCOPY AND STENT PLACEMENT Bilateral 05/16/2022   Procedure: CYSTOSCOPY WITH BILATERAL RETROGRADE PYELOGRAM, LEFT URETEROSCOPY WITH BIOPSY, HOLMIUM LASER OF LEFT URETERAL STONE  AND LEFT STENT EXCHANGE, TUR LEFT URETERAL ORIFACE;  Surgeon: Bjorn Pippin, MD;  Location: [facility];  Service: Urology;  Laterality: Bilateral;  60 MINUTES   CYSTOSCOPY WITH RETROGRADE PYELOGRAM, URETEROSCOPY AND  STENT PLACEMENT Left 07/26/2022   Procedure: CYSTOSCOPY WITH RETROGRADE PYELOGRAM, URETEROSCOPY AND STENT EXCHANGE;  Surgeon: Sebastian Ache, MD;  Location: [facility];  Service: Urology;  Laterality: Left;   LYMPH NODE DISSECTION Bilateral 07/26/2022   Procedure: LYMPH NODE DISSECTION;  Surgeon: Sebastian Ache, MD;  Location: [facility];  Service: Urology;  Laterality: Bilateral;   POLYPECTOMY     SKIN CANCER EXCISION     TONSILLECTOMY  as a child x2   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   Procedure: RESTAGING TRANSURETHRAL RESECTION OF BLADDER TUMOR  (TURBT) LEFT URETEROSCOPY WITH LASER LEFT STENT EXCHANGE RIGHT STENT REMOVAL;  Surgeon: Bjorn Pippin, MD;  Location: [facility];  Service: Urology;  Laterality: Bilateral;   TRANSURETHRAL RESECTION OF BLADDER TUMOR WITH MITOMYCIN-C Bilateral 01/03/2022   Procedure: CYSTOSCOPY TRANSURETHRAL RESECTION OF BLADDER TUMOR WITH POSSIBLE  BILATERAL RETROGRADES STENT PLACEMENT;  Surgeon: Bjorn Pippin, MD;  Location: [facility];  Service: Urology;  Laterality: Bilateral;   WISDOM TOOTH EXTRACTION      FAMILY HISTORY: Family History  Problem Relation Age of Onset   Stroke Father    Stroke Mother    Stroke Sister    Breast cancer Sister    High blood pressure Sister    Diabetes Sister    Colon cancer Other        Uncle   Colon cancer Paternal Uncle    Mitral valve prolapse Other        sugery 09-27-2017   Rectal cancer Neg Hx    Stomach cancer Neg Hx     SOCIAL HISTORY: Social History   Socioeconomic History   Marital status: Widowed    Spouse name: Stark Bray   Number of children: 1   Years of education: Masters   Highest education level: Not on file  Occupational History    Employer: RETIRED  Tobacco Use   Smoking status: Former    Types: Cigarettes    Quit date: 12/18/1965    Years since quitting: 57.2    Passive exposure: Never   Smokeless tobacco: Never  Vaping Use   Vaping Use: Never used  Substance and Sexual Activity    Alcohol use: Not Currently    Comment: 5-7 drinks per week   Drug use: No   Sexual activity: Not on file  Other Topics Concern   Not on file  Social History Narrative   Patient is married Stark Bray).   Patient is retired.   Patient has one adult child.   Patient does not drink any caffeine.   Patient is right-handed.   Patient has a Event organiser.            Social Determinants of Health   Financial Resource Strain: Not on file  Food Insecurity: Not on file  Transportation Needs: Not on file  Physical Activity: Not on file  Stress: Not on file  Social Connections: Not on file  Intimate Partner Violence: Not on file      PHYSICAL EXAM Generalized: Well developed, in no acute distress   Neurological examination  Mentation: Alert oriented to time, place, history taking. Follows all commands speech and language fluent Cranial nerve II-XII: Facial symmetry noted  DIAGNOSTIC DATA (LABS, IMAGING, TESTING) - I reviewed patient records, labs, notes, testing and imaging myself where available.  Lab Results  Component Value Date   WBC 11.6 (H) 08/08/2022   HGB 12.6 (L) 08/08/2022   HCT 37.0 (L) 08/08/2022   MCV 96.1 08/08/2022   PLT 263 08/08/2022      Component Value Date/Time   NA 137 08/08/2022 0628   NA 138 03/10/2020 1043   K 3.6 08/08/2022 0628   CL 100 08/08/2022 0628   CO2 25 08/08/2022 0623   GLUCOSE 120 (H) 08/08/2022 0628   BUN 16 08/08/2022 0628   BUN 21 03/10/2020 1043   CREATININE 0.80 08/08/2022 0628   CALCIUM 8.8 (L) 08/08/2022 0623   PROT 7.1  12/18/2021 0601   ALBUMIN 4.0 12/18/2021 0601   AST 25 12/18/2021 0601   ALT 22 12/18/2021 0601   ALKPHOS 89 12/18/2021 0601   BILITOT 0.6 12/18/2021 0601   GFRNONAA >60 08/08/2022 0623   GFRAA 94 03/10/2020 1043   Lab Results  Component Value Date   CHOL 123 08/18/2019   HDL 37 (L) 08/18/2019   LDLCALC 75 08/18/2019   TRIG 54 08/18/2019   CHOLHDL 3.3 08/18/2019   Lab Results  Component Value  Date   HGBA1C 5.6 08/26/2019      ASSESSMENT AND PLAN 82 y.o. year old male  has a past medical history of AAA (abdominal aortic aneurysm) (HCC), Atrial fibrillation (HCC), Bradycardia, Bradycardia, Cancer (HCC), Cardiac murmur, Dizziness, Dysrhythmia, GERD (gastroesophageal reflux disease), Glaucoma, Heart palpitations, Hyperlipidemia, Hypertension, Numbness (08/26/2019), Pneumonia, Skin abnormalities, Sleep apnea, and Snoring. here with:  OSA on CPAP  CPAP compliance excellent Residual AHI is good Encouraged patient to continue using CPAP nightly and > 4 hours each night F/U in 1 year or sooner if needed    Butch Penny, MSN, NP-C 03/13/2023, 1:41 PM [facility] [address], [address] [place], [place] [address] [phone]

## 2023-03-13 ENCOUNTER — Telehealth (INDEPENDENT_AMBULATORY_CARE_PROVIDER_SITE_OTHER): Payer: PPO | Admitting: Adult Health

## 2023-03-13 DIAGNOSIS — G4733 Obstructive sleep apnea (adult) (pediatric): Secondary | ICD-10-CM | POA: Diagnosis not present

## 2023-04-18 HISTORY — PX: SKIN CANCER EXCISION: SHX779

## 2023-05-08 DIAGNOSIS — H43813 Vitreous degeneration, bilateral: Secondary | ICD-10-CM | POA: Diagnosis not present

## 2023-05-08 DIAGNOSIS — H35371 Puckering of macula, right eye: Secondary | ICD-10-CM | POA: Diagnosis not present

## 2023-05-08 DIAGNOSIS — H353213 Exudative age-related macular degeneration, right eye, with inactive scar: Secondary | ICD-10-CM | POA: Diagnosis not present

## 2023-06-06 DIAGNOSIS — C689 Malignant neoplasm of urinary organ, unspecified: Secondary | ICD-10-CM | POA: Diagnosis not present

## 2023-06-06 DIAGNOSIS — E782 Mixed hyperlipidemia: Secondary | ICD-10-CM | POA: Diagnosis not present

## 2023-06-06 DIAGNOSIS — Z86718 Personal history of other venous thrombosis and embolism: Secondary | ICD-10-CM | POA: Diagnosis not present

## 2023-06-06 DIAGNOSIS — I723 Aneurysm of iliac artery: Secondary | ICD-10-CM | POA: Diagnosis not present

## 2023-06-06 DIAGNOSIS — R7309 Other abnormal glucose: Secondary | ICD-10-CM | POA: Diagnosis not present

## 2023-06-06 DIAGNOSIS — I700 Atherosclerosis of aorta: Secondary | ICD-10-CM | POA: Diagnosis not present

## 2023-06-06 DIAGNOSIS — I10 Essential (primary) hypertension: Secondary | ICD-10-CM | POA: Diagnosis not present

## 2023-06-22 DIAGNOSIS — H6123 Impacted cerumen, bilateral: Secondary | ICD-10-CM | POA: Diagnosis not present

## 2023-06-28 DIAGNOSIS — D225 Melanocytic nevi of trunk: Secondary | ICD-10-CM | POA: Diagnosis not present

## 2023-06-28 DIAGNOSIS — L578 Other skin changes due to chronic exposure to nonionizing radiation: Secondary | ICD-10-CM | POA: Diagnosis not present

## 2023-06-28 DIAGNOSIS — D224 Melanocytic nevi of scalp and neck: Secondary | ICD-10-CM | POA: Diagnosis not present

## 2023-06-28 DIAGNOSIS — Z86018 Personal history of other benign neoplasm: Secondary | ICD-10-CM | POA: Diagnosis not present

## 2023-06-28 DIAGNOSIS — L988 Other specified disorders of the skin and subcutaneous tissue: Secondary | ICD-10-CM | POA: Diagnosis not present

## 2023-06-28 DIAGNOSIS — C44712 Basal cell carcinoma of skin of right lower limb, including hip: Secondary | ICD-10-CM | POA: Diagnosis not present

## 2023-06-28 DIAGNOSIS — D485 Neoplasm of uncertain behavior of skin: Secondary | ICD-10-CM | POA: Diagnosis not present

## 2023-06-28 DIAGNOSIS — Z85828 Personal history of other malignant neoplasm of skin: Secondary | ICD-10-CM | POA: Diagnosis not present

## 2023-06-28 DIAGNOSIS — L929 Granulomatous disorder of the skin and subcutaneous tissue, unspecified: Secondary | ICD-10-CM | POA: Diagnosis not present

## 2023-06-28 DIAGNOSIS — L570 Actinic keratosis: Secondary | ICD-10-CM | POA: Diagnosis not present

## 2023-06-28 DIAGNOSIS — D2262 Melanocytic nevi of left upper limb, including shoulder: Secondary | ICD-10-CM | POA: Diagnosis not present

## 2023-06-28 DIAGNOSIS — L821 Other seborrheic keratosis: Secondary | ICD-10-CM | POA: Diagnosis not present

## 2023-08-01 DIAGNOSIS — H26492 Other secondary cataract, left eye: Secondary | ICD-10-CM | POA: Diagnosis not present

## 2023-08-01 DIAGNOSIS — H353223 Exudative age-related macular degeneration, left eye, with inactive scar: Secondary | ICD-10-CM | POA: Diagnosis not present

## 2023-08-01 DIAGNOSIS — H35371 Puckering of macula, right eye: Secondary | ICD-10-CM | POA: Diagnosis not present

## 2023-08-01 DIAGNOSIS — H353211 Exudative age-related macular degeneration, right eye, with active choroidal neovascularization: Secondary | ICD-10-CM | POA: Diagnosis not present

## 2023-08-01 DIAGNOSIS — H43813 Vitreous degeneration, bilateral: Secondary | ICD-10-CM | POA: Diagnosis not present

## 2023-08-07 DIAGNOSIS — C44712 Basal cell carcinoma of skin of right lower limb, including hip: Secondary | ICD-10-CM | POA: Diagnosis not present

## 2023-08-14 ENCOUNTER — Telehealth: Payer: Self-pay | Admitting: Cardiovascular Disease

## 2023-08-14 MED ORDER — LOSARTAN POTASSIUM-HCTZ 100-25 MG PO TABS: 1.0000 | ORAL_TABLET | Freq: Every day | ORAL | 1 refills | Status: DC

## 2023-08-14 MED ORDER — DILTIAZEM HCL ER 240 MG PO CP24: 240.0000 mg | ORAL_CAPSULE | Freq: Every day | ORAL | 1 refills | Status: DC

## 2023-08-14 NOTE — Telephone Encounter (Signed)
Pt's medications were sent to pt's pharmacy as requested. Confirmation received.  

## 2023-08-14 NOTE — Telephone Encounter (Signed)
*  STAT* If patient is at the pharmacy, call can be transferred to refill team.   1. Which medications need to be refilled? (please list name of each medication and dose if known)   losartan-hydrochlorothiazide (HYZAAR) 100-25 MG tablet    DILT-XR 240 MG 24 hr capsule    2. Which pharmacy/location (including street and city if local pharmacy) is medication to be sent to? [facility] [place], [place] - [address]    3. Do they need a 30 day or 90 day supply? 90 day

## 2023-08-27 DIAGNOSIS — C662 Malignant neoplasm of left ureter: Secondary | ICD-10-CM | POA: Diagnosis not present

## 2023-08-27 DIAGNOSIS — C670 Malignant neoplasm of trigone of bladder: Secondary | ICD-10-CM | POA: Diagnosis not present

## 2023-09-04 DIAGNOSIS — Z5189 Encounter for other specified aftercare: Secondary | ICD-10-CM | POA: Diagnosis not present

## 2023-09-05 ENCOUNTER — Other Ambulatory Visit: Payer: Self-pay | Admitting: Urology

## 2023-09-07 ENCOUNTER — Telehealth: Payer: Self-pay

## 2023-09-07 NOTE — Telephone Encounter (Signed)
..  Pre-operative Risk Assessment    Patient Name: ISSAIAH SEEMANN  DOB: 07/27/1941 MRN: [id number]      Request for Surgical Clearance    Procedure:   CYSTO, BILATERAL RETROGRADE PYELOGRAM, LEFT DIAGNOSTIC URETEROSCOPY  Date of Surgery:  Clearance 11/02/23                                 Surgeon:  Loreli Slot Surgeon's Group or Practice Name:  [facility] Phone number:  [phone] Fax number:  [phone]   Type of Clearance Requested:   - Medical  - Pharmacy:  Hold Apixaban (Eliquis)     Type of Anesthesia:  Not Indicated   Additional requests/questions:   LAST APPT 11/24/22 NEXT APPT 11/26/23  Signed, Renee Ramus   09/07/2023, 7:53 AM

## 2023-09-10 ENCOUNTER — Telehealth: Payer: Self-pay

## 2023-09-10 NOTE — Telephone Encounter (Signed)
Pt is scheduled for tele preop 11/4 at 1:40pm. Med rec and consent done    Patient Consent for Virtual Visit        Jonathan Lane has provided verbal consent on 09/10/2023 for a virtual visit (video or telephone).   CONSENT FOR VIRTUAL VISIT FOR:  Jonathan Lane Jonathan Lane  By participating in this virtual visit I agree to the following:  I hereby voluntarily request, consent and authorize [facility] and its employed or contracted physicians, physician assistants, nurse practitioners or other licensed health care professionals (the Practitioner), to provide me with telemedicine health care services (the "Services") as deemed necessary by the treating Practitioner. I acknowledge and consent to receive the Services by the Practitioner via telemedicine. I understand that the telemedicine visit will involve communicating with the Practitioner through live audiovisual communication technology and the disclosure of certain medical information by electronic transmission. I acknowledge that I have been given the opportunity to request an in-person assessment or other available alternative prior to the telemedicine visit and am voluntarily participating in the telemedicine visit.  I understand that I have the right to withhold or withdraw my consent to the use of telemedicine in the course of my care at any time, without affecting my right to future care or treatment, and that the Practitioner or I may terminate the telemedicine visit at any time. I understand that I have the right to inspect all information obtained and/or recorded in the course of the telemedicine visit and may receive copies of available information for a reasonable fee.  I understand that some of the potential risks of receiving the Services via telemedicine include:  Delay or interruption in medical evaluation due to technological equipment failure or disruption; Information transmitted may not be sufficient (e.g. poor resolution  of images) to allow for appropriate medical decision making by the Practitioner; and/or  In rare instances, security protocols could fail, causing a breach of personal health information.  Furthermore, I acknowledge that it is my responsibility to provide information about my medical history, conditions and care that is complete and accurate to the best of my ability. I acknowledge that Practitioner's advice, recommendations, and/or decision may be based on factors not within their control, such as incomplete or inaccurate data provided by me or distortions of diagnostic images or specimens that may result from electronic transmissions. I understand that the practice of medicine is not an exact science and that Practitioner makes no warranties or guarantees regarding treatment outcomes. I acknowledge that a copy of this consent can be made available to me via my patient portal [facility] MyChart), or I can request a printed copy by calling the office of [facility].    I understand that my insurance will be billed for this visit.   I have read or had this consent read to me. I understand the contents of this consent, which adequately explains the benefits and risks of the Services being provided via telemedicine.  I have been provided ample opportunity to ask questions regarding this consent and the Services and have had my questions answered to my satisfaction. I give my informed consent for the services to be provided through the use of telemedicine in my medical care

## 2023-09-10 NOTE — Telephone Encounter (Signed)
Name: Jonathan Lane  DOB: May 11, 1941  MRN: [id number]  Primary Cardiologist: Charlton Haws, MD   Preoperative team, please contact this patient and set up a phone call appointment for further preoperative risk assessment. Please obtain consent and complete medication review. Thank you for your help.  I confirm that guidance regarding antiplatelet and oral anticoagulation therapy has been completed and, if necessary, noted below.  Eliquis prescribed by a noncardiology provider (PCP) therefore recommendations for holding deferred to prescribing provider.     Carlos Levering, NP 09/10/2023, 11:09 AM [facility]

## 2023-09-10 NOTE — Telephone Encounter (Signed)
Pt is scheduled for tele preop 11/4 at 1:40pm. Med rec and consent done

## 2023-10-04 DIAGNOSIS — R109 Unspecified abdominal pain: Secondary | ICD-10-CM | POA: Diagnosis not present

## 2023-10-04 DIAGNOSIS — R200 Anesthesia of skin: Secondary | ICD-10-CM | POA: Diagnosis not present

## 2023-10-05 ENCOUNTER — Emergency Department (HOSPITAL_BASED_OUTPATIENT_CLINIC_OR_DEPARTMENT_OTHER): Payer: PPO

## 2023-10-05 ENCOUNTER — Inpatient Hospital Stay (HOSPITAL_BASED_OUTPATIENT_CLINIC_OR_DEPARTMENT_OTHER)
Admission: EM | Admit: 2023-10-05 | Discharge: 2023-10-12 | DRG: 417 | Disposition: A | Discharge status: Home Health | Payer: PPO | Source: Ambulatory Visit | Attending: Internal Medicine | Admitting: Internal Medicine

## 2023-10-05 ENCOUNTER — Other Ambulatory Visit: Payer: Self-pay

## 2023-10-05 ENCOUNTER — Encounter (HOSPITAL_BASED_OUTPATIENT_CLINIC_OR_DEPARTMENT_OTHER): Payer: Self-pay | Admitting: Emergency Medicine

## 2023-10-05 DIAGNOSIS — E876 Hypokalemia: Secondary | ICD-10-CM | POA: Diagnosis present

## 2023-10-05 DIAGNOSIS — K8309 Other cholangitis: Secondary | ICD-10-CM | POA: Diagnosis present

## 2023-10-05 DIAGNOSIS — R509 Fever, unspecified: Secondary | ICD-10-CM | POA: Diagnosis not present

## 2023-10-05 DIAGNOSIS — K8060 Calculus of gallbladder and bile duct with cholecystitis, unspecified, without obstruction: Principal | ICD-10-CM | POA: Diagnosis present

## 2023-10-05 DIAGNOSIS — G4733 Obstructive sleep apnea (adult) (pediatric): Secondary | ICD-10-CM

## 2023-10-05 DIAGNOSIS — N400 Enlarged prostate without lower urinary tract symptoms: Secondary | ICD-10-CM | POA: Diagnosis not present

## 2023-10-05 DIAGNOSIS — Z79899 Other long term (current) drug therapy: Secondary | ICD-10-CM

## 2023-10-05 DIAGNOSIS — Z8551 Personal history of malignant neoplasm of bladder: Secondary | ICD-10-CM

## 2023-10-05 DIAGNOSIS — K8020 Calculus of gallbladder without cholecystitis without obstruction: Secondary | ICD-10-CM | POA: Diagnosis not present

## 2023-10-05 DIAGNOSIS — Z8554 Personal history of malignant neoplasm of ureter: Secondary | ICD-10-CM

## 2023-10-05 DIAGNOSIS — K5909 Other constipation: Secondary | ICD-10-CM | POA: Diagnosis present

## 2023-10-05 DIAGNOSIS — I4891 Unspecified atrial fibrillation: Secondary | ICD-10-CM | POA: Diagnosis present

## 2023-10-05 DIAGNOSIS — Z87891 Personal history of nicotine dependence: Secondary | ICD-10-CM

## 2023-10-05 DIAGNOSIS — Z800 Family history of malignant neoplasm of digestive organs: Secondary | ICD-10-CM

## 2023-10-05 DIAGNOSIS — Z7902 Long term (current) use of antithrombotics/antiplatelets: Secondary | ICD-10-CM

## 2023-10-05 DIAGNOSIS — R7989 Other specified abnormal findings of blood chemistry: Secondary | ICD-10-CM

## 2023-10-05 DIAGNOSIS — Z86718 Personal history of other venous thrombosis and embolism: Secondary | ICD-10-CM

## 2023-10-05 DIAGNOSIS — Z881 Allergy status to other antibiotic agents status: Secondary | ICD-10-CM

## 2023-10-05 DIAGNOSIS — H409 Unspecified glaucoma: Secondary | ICD-10-CM | POA: Diagnosis present

## 2023-10-05 DIAGNOSIS — R109 Unspecified abdominal pain: Secondary | ICD-10-CM | POA: Diagnosis not present

## 2023-10-05 DIAGNOSIS — K2990 Gastroduodenitis, unspecified, without bleeding: Secondary | ICD-10-CM | POA: Diagnosis present

## 2023-10-05 DIAGNOSIS — I2510 Atherosclerotic heart disease of native coronary artery without angina pectoris: Secondary | ICD-10-CM | POA: Diagnosis present

## 2023-10-05 DIAGNOSIS — I10 Essential (primary) hypertension: Secondary | ICD-10-CM | POA: Diagnosis present

## 2023-10-05 DIAGNOSIS — I714 Abdominal aortic aneurysm, without rupture, unspecified: Secondary | ICD-10-CM | POA: Diagnosis present

## 2023-10-05 DIAGNOSIS — Z803 Family history of malignant neoplasm of breast: Secondary | ICD-10-CM

## 2023-10-05 DIAGNOSIS — Z833 Family history of diabetes mellitus: Secondary | ICD-10-CM

## 2023-10-05 DIAGNOSIS — K851 Biliary acute pancreatitis without necrosis or infection: Principal | ICD-10-CM | POA: Diagnosis present

## 2023-10-05 DIAGNOSIS — Z7901 Long term (current) use of anticoagulants: Secondary | ICD-10-CM

## 2023-10-05 DIAGNOSIS — Z87442 Personal history of urinary calculi: Secondary | ICD-10-CM

## 2023-10-05 DIAGNOSIS — Z85828 Personal history of other malignant neoplasm of skin: Secondary | ICD-10-CM

## 2023-10-05 DIAGNOSIS — Z8673 Personal history of transient ischemic attack (TIA), and cerebral infarction without residual deficits: Secondary | ICD-10-CM

## 2023-10-05 DIAGNOSIS — K2970 Gastritis, unspecified, without bleeding: Secondary | ICD-10-CM

## 2023-10-05 DIAGNOSIS — K219 Gastro-esophageal reflux disease without esophagitis: Secondary | ICD-10-CM | POA: Diagnosis present

## 2023-10-05 DIAGNOSIS — R1031 Right lower quadrant pain: Secondary | ICD-10-CM | POA: Diagnosis not present

## 2023-10-05 DIAGNOSIS — N200 Calculus of kidney: Secondary | ICD-10-CM | POA: Diagnosis not present

## 2023-10-05 DIAGNOSIS — Z8701 Personal history of pneumonia (recurrent): Secondary | ICD-10-CM

## 2023-10-05 DIAGNOSIS — K838 Other specified diseases of biliary tract: Secondary | ICD-10-CM

## 2023-10-05 DIAGNOSIS — Z823 Family history of stroke: Secondary | ICD-10-CM

## 2023-10-05 DIAGNOSIS — K5730 Diverticulosis of large intestine without perforation or abscess without bleeding: Secondary | ICD-10-CM | POA: Diagnosis not present

## 2023-10-05 DIAGNOSIS — E785 Hyperlipidemia, unspecified: Secondary | ICD-10-CM | POA: Diagnosis present

## 2023-10-05 DIAGNOSIS — I679 Cerebrovascular disease, unspecified: Secondary | ICD-10-CM | POA: Diagnosis present

## 2023-10-05 LAB — URINALYSIS, ROUTINE W REFLEX MICROSCOPIC
Bacteria, UA: NONE SEEN
Glucose, UA: NEGATIVE mg/dL
Hgb urine dipstick: NEGATIVE
Ketones, ur: NEGATIVE mg/dL
Leukocytes,Ua: NEGATIVE
Nitrite: NEGATIVE
Protein, ur: 30 mg/dL — AB
Specific Gravity, Urine: 1.025 (ref 1.005–1.030)
pH: 7.5 (ref 5.0–8.0)

## 2023-10-05 LAB — COMPREHENSIVE METABOLIC PANEL
ALT: 506 U/L — ABNORMAL HIGH (ref 0–44)
AST: 520 U/L — ABNORMAL HIGH (ref 15–41)
Albumin: 4.2 g/dL (ref 3.5–5.0)
Alkaline Phosphatase: 281 U/L — ABNORMAL HIGH (ref 38–126)
Anion gap: 10 (ref 5–15)
BUN: 14 mg/dL (ref 8–23)
CO2: 28 mmol/L (ref 22–32)
Calcium: 9.7 mg/dL (ref 8.9–10.3)
Chloride: 96 mmol/L — ABNORMAL LOW (ref 98–111)
Creatinine, Ser: 0.75 mg/dL (ref 0.61–1.24)
GFR, Estimated: >60 mL/min (ref >60)
Glucose, Bld: 153 mg/dL — ABNORMAL HIGH (ref 70–99)
Potassium: 3.6 mmol/L (ref 3.5–5.1)
Sodium: 134 mmol/L — ABNORMAL LOW (ref 135–145)
Total Bilirubin: 3.2 mg/dL — ABNORMAL HIGH (ref 0.3–1.2)
Total Protein: 7.6 g/dL (ref 6.5–8.1)

## 2023-10-05 LAB — CBC
HCT: 41.8 % (ref 39.0–52.0)
Hemoglobin: 14 g/dL (ref 13.0–17.0)
MCH: 30.2 pg (ref 26.0–34.0)
MCHC: 33.5 g/dL (ref 30.0–36.0)
MCV: 90.1 fL (ref 80.0–100.0)
Platelets: 184 10*3/uL (ref 150–400)
RBC: 4.64 MIL/uL (ref 4.22–5.81)
RDW: 14.6 % (ref 11.5–15.5)
WBC: 11.3 10*3/uL — ABNORMAL HIGH (ref 4.0–10.5)
nRBC: 0 % (ref 0.0–0.2)

## 2023-10-05 LAB — LIPASE, BLOOD: Lipase: 537 U/L — ABNORMAL HIGH (ref 11–51)

## 2023-10-05 MED ORDER — PIPERACILLIN-TAZOBACTAM 3.375 G IVPB
3.3750 g | Freq: Three times a day (TID) | INTRAVENOUS | Status: DC
  Administered 2023-10-06 – 2023-10-11 (×17): 3.375 g via INTRAVENOUS
  Filled 2023-10-05 (×18): qty 50

## 2023-10-05 MED ORDER — PIPERACILLIN-TAZOBACTAM 3.375 G IVPB 30 MIN
3.3750 g | Freq: Once | INTRAVENOUS | Status: AC
  Administered 2023-10-05: 3.375 g via INTRAVENOUS
  Filled 2023-10-05: qty 50

## 2023-10-05 MED ORDER — IOHEXOL 300 MG/ML  SOLN
100.0000 mL | Freq: Once | INTRAMUSCULAR | Status: AC | PRN
  Administered 2023-10-05: 100 mL via INTRAVENOUS

## 2023-10-05 MED ORDER — ONDANSETRON HCL 4 MG/2ML IJ SOLN
4.0000 mg | Freq: Once | INTRAMUSCULAR | Status: AC
  Administered 2023-10-05: 4 mg via INTRAVENOUS
  Filled 2023-10-05: qty 2

## 2023-10-05 MED ORDER — LACTATED RINGERS IV BOLUS
500.0000 mL | Freq: Once | INTRAVENOUS | Status: AC
  Administered 2023-10-05: 500 mL via INTRAVENOUS

## 2023-10-05 MED ORDER — MORPHINE SULFATE (PF) 4 MG/ML IV SOLN
4.0000 mg | Freq: Once | INTRAVENOUS | Status: AC
  Administered 2023-10-05: 4 mg via INTRAVENOUS
  Filled 2023-10-05: qty 1

## 2023-10-05 NOTE — ED Notes (Signed)
PT report called to [facility] RN [facility] room [phone]. Pam Rn. Verbalized complete understanding of pt plan of care and current condition, denies questions at this time.

## 2023-10-05 NOTE — ED Provider Notes (Signed)
EMERGENCY DEPARTMENT AT [facility] Provider Note   CSN: [id number] Arrival date & time: 10/05/23  1726     History  Chief Complaint  Patient presents with   Abdominal Pain    Jonathan Lane is a 82 y.o. male.   Abdominal Pain 82 year old male history of GERD, hyperlipidemia, hypertension presenting for abdominal pain and fever.  Patient states on Sunday he had some bad soup and since and is felt bad.  He felt little better on Wednesday but then since last night he had severe right lower quadrant abdominal pain.  Has had nausea most of nonbloody emesis today.  No diarrhea, regular bowels.  Fever 102 today when he went to his PCPs office and they sent him here.  No chest pain or shortness of breath.  No urinary symptoms.     Home Medications Prior to Admission medications   Medication Sig Start Date End Date Taking? Authorizing Provider  acetaminophen (TYLENOL) 500 MG tablet Take 500 mg by mouth every 8 (eight) hours as needed for mild pain or headache.    [provider]  apixaban (ELIQUIS) 5 MG TABS tablet TAKE ONE TABLET BY MOUTH TWICE DAILY START WHEN FINISHED STARTER PACK 03/01/23   Baglia, Corrina, PA-C  Apoaequorin (PREVAGEN EXTRA STRENGTH) 20 MG CAPS Take 1 capsule by mouth daily.    [provider]  atorvastatin (LIPITOR) 20 MG tablet Take 1 tablet (20 mg total) by mouth daily. Patient taking differently: Take 20 mg by mouth at bedtime. 08/26/19   Zannie Cove, MD  Cholecalciferol 25 MCG (1000 UT) capsule Take 1,000 Units by mouth daily.    [provider]  clopidogrel (PLAVIX) 75 MG tablet Take 1 tablet by mouth daily.    [provider]  Coenzyme Q-10 200 MG CAPS Take 1 capsule by mouth daily.    [provider]  COSOPT PF 2-0.5 % SOLN ophthalmic solution Place 1 drop into both eyes in the morning and at bedtime.    [provider]  CRANBERRY PO Take 1 tablet by mouth daily.    [provider]  cyanocobalamin (VITAMIN B12) 1000 MCG tablet Take 500 mcg by mouth daily.    [provider]  Deanol (DIMETHYLAMINOETHANOL) LIQD by Does not apply route.    [provider]  diltiazem (DILT-XR) 240 MG 24 hr capsule Take 1 capsule (240 mg total) by mouth daily. Please call our office to schedule an yearly appointment with Dr. Eden Emms for December 2024 before anymore refills. [phone]. Thank you 1st attempt 08/14/23   [name] Stade, MD  docusate sodium (COLACE) 100 MG capsule Take 1 capsule (100 mg total) by mouth 2 (two) times daily. 07/26/22   Harrie Foreman, PA-C  esomeprazole (NEXIUM) 20 MG capsule Take 20 mg by mouth 2 (two) times daily before a meal.    [provider]  fluorouracil (EFUDEX) 5 % cream Apply 1 Application topically at bedtime. 07/10/22   [provider]  fluticasone (FLONASE) 50 MCG/ACT nasal spray Place 2 sprays into both nostrils daily as needed for allergies or rhinitis.    [provider]  Glucosamine-Chondroitin (MOVE FREE PO) Take 1 tablet by mouth daily.    [provider]  losartan-hydrochlorothiazide (HYZAAR) 100-25 MG tablet Take 1 tablet by mouth daily. 08/14/23   [name] Stade, MD  Melatonin 3 MG CAPS Take 2 capsules by mouth at bedtime. 6 mg nightly    [provider]  Misc Natural Products (PROSTATE  HEALTH) CAPS Take 1 capsule by mouth daily.    [provider]  Multiple Vitamins-Minerals (ADULT GUMMY PO) Take 2 capsules by mouth daily.    [provider]  Multiple Vitamins-Minerals (PRESERVISION AREDS 2+MULTI VIT) CAPS Take 1 capsule by mouth 2 times daily at 12 noon and 4 pm.    [provider]  nitroGLYCERIN (NITROSTAT) 0.4 MG SL tablet Place 1 tablet (0.4 mg total) under the tongue every 5 (five) minutes as needed for chest pain. 08/18/19   Regalado, Belkys A, MD  NON FORMULARY Pt uses a cpap nightly    [provider]  Omega-3 Fatty Acids (FISH OIL)  1200 MG CAPS Take 1 capsule by mouth daily.    [provider]  OVER THE COUNTER MEDICATION Take 1 tablet by mouth daily. Focus Factor    [provider]  OVER THE COUNTER MEDICATION Take 1 capsule by mouth daily. Saffron 28 mg    [provider]  Probiotic Product (ALIGN PO) Take 1 capsule by mouth in the morning.    [provider]  RHOPRESSA 0.02 % SOLN Place 1 drop into both eyes at bedtime.  01/08/18   [provider]  tadalafil (CIALIS) 5 MG tablet Take 5 mg by mouth daily.    [provider]  tamsulosin (FLOMAX) 0.4 MG CAPS capsule Take 1 capsule (0.4 mg total) by mouth daily. 08/08/22   Crist Fat, MD  valACYclovir (VALTREX) 1000 MG tablet Take 1,000 mg by mouth 2 (two) times daily as needed (for outbreaks).    [provider]  Zinc 25 MG TABS Take 1 tablet by mouth daily.    [provider]      Allergies    Brimonidine tartrate-timolol, Brimonidine tartrate, Tetracycline hcl, and Ciprofloxacin    Review of Systems   Review of Systems  Gastrointestinal:  Positive for abdominal pain.  Review of systems completed and notable as per HPI.  ROS otherwise negative.   Physical Exam Updated Vital Signs BP (!) 148/65 (BP Location: Right Arm)   Pulse 71   Temp 98.5 F (36.9 C) (Oral)   Resp 18   SpO2 94%  Physical Exam Vitals and nursing note reviewed.  Constitutional:      General: He is not in acute distress.    Appearance: He is well-developed.  HENT:     Head: Normocephalic and atraumatic.     Mouth/Throat:     Mouth: Mucous membranes are moist.     Pharynx: Oropharynx is clear.  Eyes:     Extraocular Movements: Extraocular movements intact.     Conjunctiva/sclera: Conjunctivae normal.     Pupils: Pupils are equal, round, and reactive to light.  Cardiovascular:     Rate and Rhythm: Normal rate and regular rhythm.     Pulses: Normal pulses.     Heart sounds: Normal heart sounds. No murmur  heard. Pulmonary:     Effort: Pulmonary effort is normal. No respiratory distress.     Breath sounds: Normal breath sounds.  Abdominal:     Palpations: Abdomen is soft.     Tenderness: There is abdominal tenderness in the right upper quadrant and right lower quadrant. There is no right CVA tenderness, left CVA tenderness, guarding or rebound.  Musculoskeletal:        General: No swelling.     Cervical back: Neck supple.     Right lower leg: No edema.     Left lower leg: No edema.  Skin:  General: Skin is warm and dry.     Capillary Refill: Capillary refill takes less than 2 seconds.  Neurological:     General: No focal deficit present.     Mental Status: He is alert and oriented to person, place, and time. Mental status is at baseline.  Psychiatric:        Mood and Affect: Mood normal.     ED Results / Procedures / Treatments   Labs (all labs ordered are listed, but only abnormal results are displayed) Labs Reviewed  LIPASE, BLOOD - Abnormal; Notable for the following components:      Result Value   Lipase 537 (*)    All other components within normal limits  COMPREHENSIVE METABOLIC PANEL - Abnormal; Notable for the following components:   Sodium 134 (*)    Chloride 96 (*)    Glucose, Bld 153 (*)    AST 520 (*)    ALT 506 (*)    Alkaline Phosphatase 281 (*)    Total Bilirubin 3.2 (*)    All other components within normal limits  CBC - Abnormal; Notable for the following components:   WBC 11.3 (*)    All other components within normal limits  URINALYSIS, ROUTINE W REFLEX MICROSCOPIC - Abnormal; Notable for the following components:   Bilirubin Urine SMALL (*)    Protein, ur 30 (*)    All other components within normal limits  CULTURE, BLOOD (ROUTINE X 2)  CULTURE, BLOOD (ROUTINE X 2)    EKG None  Radiology US Abdomen Limited RUQ (LIVER/GB)  Result Date: 10/05/2023 CLINICAL DATA:  Abdominal pain EXAM: ULTRASOUND ABDOMEN LIMITED RIGHT UPPER QUADRANT  COMPARISON:  CT from earlier in the same day. FINDINGS: Gallbladder: Cholelithiasis is again identified. No wall thickening or pericholecystic fluid is noted. Negative sonographic Murphy's sign is noted. Common bile duct: Diameter: 3 mm Liver: No focal lesion identified. Within normal limits in parenchymal echogenicity. Portal vein is patent on color Doppler imaging with normal direction of blood flow towards the liver. Other: None. IMPRESSION: Cholelithiasis without complicating factors. Electronically Signed   By: Alcide Clever M.D.   On: 10/05/2023 21:31   CT ABDOMEN PELVIS W CONTRAST  Result Date: 10/05/2023 CLINICAL DATA:  Right lower quadrant pain.  Fever. EXAM: CT ABDOMEN AND PELVIS WITH CONTRAST TECHNIQUE: Multidetector CT imaging of the abdomen and pelvis was performed using the standard protocol following bolus administration of intravenous contrast. RADIATION DOSE REDUCTION: This exam was performed according to the departmental dose-optimization program which includes automated exposure control, adjustment of the mA and/or kV according to patient size and/or use of iterative reconstruction technique. CONTRAST:  OMNIPAQUE IOHEXOL 300 MG/ML  SOLN COMPARISON:  02/19/2023 FINDINGS: Lower Chest: No acute findings. Hepatobiliary: No suspicious hepatic masses identified. Gallstones are seen, however there is no evidence of cholecystitis or biliary dilatation. Pancreas:  No mass or inflammatory changes. Spleen: Within normal limits in size and appearance. Adrenals/Urinary Tract: No suspicious masses identified. 4 mm calculus again seen in lower pole of left kidney. No evidence of ureteral calculi or hydronephrosis. Unremarkable unopacified urinary bladder. Stomach/Bowel: No evidence of obstruction, inflammatory process or abnormal fluid collections. Diverticulosis is seen mainly involving the sigmoid colon, however there is no evidence of diverticulitis. Although the appendix is not directly visualized,  no inflammatory process seen in region of the cecum or elsewhere. Vascular/Lymphatic: Stable shotty sub-centimeter retroperitoneal lymph nodes. No pathologically enlarged lymph nodes. No acute vascular findings. Reproductive:  Stable mildly enlarged prostate. Other:  None. Musculoskeletal:  No suspicious bone lesions identified. IMPRESSION: No evidence of appendicitis or other acute findings. Cholelithiasis. No radiographic evidence of cholecystitis. Colonic diverticulosis, without radiographic evidence of diverticulitis. Stable mildly enlarged prostate. Small left renal calculus. No evidence of ureteral calculi or hydronephrosis. Electronically Signed   By: Danae Orleans M.D.   On: 10/05/2023 20:13    Procedures Procedures    Medications Ordered in ED Medications  piperacillin-tazobactam (ZOSYN) IVPB 3.375 g (0 g Intravenous Stopped 10/05/23 2125)    Followed by  piperacillin-tazobactam (ZOSYN) IVPB 3.375 g (has no administration in time range)  ondansetron (ZOFRAN) injection 4 mg (4 mg Intravenous Given 10/05/23 1843)  morphine (PF) 4 MG/ML injection 4 mg (4 mg Intravenous Given 10/05/23 1843)  lactated ringers bolus 500 mL (0 mLs Intravenous Stopped 10/05/23 2124)  iohexol (OMNIPAQUE) 300 MG/ML solution 100 mL (100 mLs Intravenous Contrast Given 10/05/23 1934)    ED Course/ Medical Decision Making/ A&P Clinical Course as of 10/05/23 2351  Fri Oct 05, 2023  2021 He is in bed tube on Sunday.  Since that has had nausea, 1 episode of emesis.  Right lower quadrant pain since last night.  Fever to 102.  Sent by PCP. [JD]  [address]: admission, MRCP, pancreatitis vs cholangitis, trend labs, either hospital [JD]    Clinical Course User Index [JD] Laurence Spates, MD                                 Medical Decision Making Amount and/or Complexity of Data Reviewed Labs: ordered. Radiology: ordered.  Risk Prescription drug management. Decision regarding hospitalization.   Medical  Decision Making:   JAYSIAH FRANCESCONI is a 82 y.o. male who presented to the ED today with abdominal pain, vomiting.  Vital signs reviewed.  Afebrile here but had a fever of 102 earlier.  Tender in the right Hemi abdomen.  Differential including cholecystitis, pyelo-, appendicitis.   Patient placed on continuous vitals and telemetry monitoring while in ED which was reviewed periodically.  Reviewed and confirmed nursing documentation for past medical history, family history, social history.  Reassessment and Plan:   Lab work reviewed notable for elevated lipase, leukocytosis, transaminitis with elevated alk phos and bilirubin concerning for possible cholangitis especially with his fever.  CT scan shows cholelithiasis but no signs of cholecystitis or stone or choledocholithiasis.  I did ultrasound of his right upper quadrant shows cholelithiasis but no signs of biliary obstruction or Coley cystitis.  I am concern for acute cholangitis although that stone is not present.  Is feeling much better wonder if he could have passed a stone.  I spoke with Dr. Leonides Schanz with gastroenterology with [facility] who recommended admission for MRCP and workup for pancreatitis versus cholangitis and trending labs.  I spoke with Dr. Antionette Char who agreed to admit the patient.  Patient updated on plan.   Patient's presentation is most consistent with acute complicated illness / injury requiring diagnostic workup.           Final Clinical Impression(s) / ED Diagnoses Final diagnoses:  Fever, unspecified fever cause    Rx / DC Orders ED Discharge Orders     None         Laurence Spates, MD 10/05/23 2351

## 2023-10-05 NOTE — Progress Notes (Signed)
Plan of Care Note for accepted transfer   Patient: Jonathan Lane MRN: [id number]   DOA: 10/05/2023  Facility requesting transfer: [facility]   Requesting Provider: Dr. Earlene Plater   Reason for transfer: Suspected cholangitis   Facility course: 82 yr old man with hx of HTN, HLD, TIA, and OSA who presents with abdominal pain, N/V, and fever.   He was febrile in clinic today, afebrile in ED. AST 520, ALT 506, lipase 537, and t bili 3.5. CT and [place] both show cholelithiasis without acute findings.   GI (Dr. Leonides Schanz) was consulted by the ED physician and recommended MRCP and antibiotics.  Blood cultures were collected and the patient was treated with Zosyn, IV fluids, morphine, and Zofran.  Plan of care: The patient is accepted for admission to Telemetry unit, at [facility].   Author: Briscoe Deutscher, MD 10/05/2023  Check [email or web address] for on-call coverage.  Nursing staff, Please call [facility] Admits & Consults System-Wide number on Amion as soon as patient's arrival, so appropriate admitting provider can evaluate the pt.

## 2023-10-05 NOTE — ED Triage Notes (Signed)
Sunday right sdie pain. Today developed fever with pain t 102.5 at doctor office

## 2023-10-05 NOTE — Progress Notes (Signed)
Pharmacy Antibiotic Note  DASHONE BIXLER is a 82 y.o. male for which pharmacy has been consulted for zosyn dosing for  IAI .  SCr 0.75 WBC 11.3; T 98.8; HR 72; RR 14  Plan: Zosyn 3.375g IV q8h (4 hour infusion) Monitor WBC, fever, renal function, cultures De-escalate when able     Temp (24hrs), Avg:98.8 F (37.1 C), Min:98.8 F (37.1 C), Max:98.8 F (37.1 C)  Recent Labs  Lab 10/05/23 1742  WBC 11.3*  CREATININE 0.75    CrCl cannot be calculated (Unknown ideal weight.).    Allergies  Allergen Reactions   Brimonidine Tartrate-Timolol Itching, Rash and Other (See Comments)    Itching eyes Other reaction(s): Not available   Brimonidine Tartrate Other (See Comments)    Irritates the eyes and increased pressure   Tetracycline Hcl Other (See Comments)    Doesn't remember reaction-"like 55 years ago"    Ciprofloxacin Other (See Comments)    Constipation   Microbiology results: Pending  Thank you for allowing pharmacy to be a part of this patient's care.  Delmar Landau, PharmD, BCPS 10/05/2023 8:27 PM ED Clinical Pharmacist -  [phone]

## 2023-10-06 ENCOUNTER — Inpatient Hospital Stay (HOSPITAL_COMMUNITY): Payer: PPO

## 2023-10-06 DIAGNOSIS — I714 Abdominal aortic aneurysm, without rupture, unspecified: Secondary | ICD-10-CM | POA: Diagnosis not present

## 2023-10-06 DIAGNOSIS — R1031 Right lower quadrant pain: Secondary | ICD-10-CM | POA: Diagnosis present

## 2023-10-06 DIAGNOSIS — Z7902 Long term (current) use of antithrombotics/antiplatelets: Secondary | ICD-10-CM | POA: Diagnosis not present

## 2023-10-06 DIAGNOSIS — Z803 Family history of malignant neoplasm of breast: Secondary | ICD-10-CM | POA: Diagnosis not present

## 2023-10-06 DIAGNOSIS — K851 Biliary acute pancreatitis without necrosis or infection: Secondary | ICD-10-CM | POA: Diagnosis not present

## 2023-10-06 DIAGNOSIS — I739 Peripheral vascular disease, unspecified: Secondary | ICD-10-CM | POA: Diagnosis not present

## 2023-10-06 DIAGNOSIS — Z86718 Personal history of other venous thrombosis and embolism: Secondary | ICD-10-CM | POA: Diagnosis not present

## 2023-10-06 DIAGNOSIS — Z800 Family history of malignant neoplasm of digestive organs: Secondary | ICD-10-CM | POA: Diagnosis not present

## 2023-10-06 DIAGNOSIS — R7989 Other specified abnormal findings of blood chemistry: Secondary | ICD-10-CM

## 2023-10-06 DIAGNOSIS — R509 Fever, unspecified: Secondary | ICD-10-CM | POA: Diagnosis not present

## 2023-10-06 DIAGNOSIS — Z8551 Personal history of malignant neoplasm of bladder: Secondary | ICD-10-CM | POA: Diagnosis not present

## 2023-10-06 DIAGNOSIS — K5909 Other constipation: Secondary | ICD-10-CM | POA: Diagnosis not present

## 2023-10-06 DIAGNOSIS — Z7901 Long term (current) use of anticoagulants: Secondary | ICD-10-CM | POA: Diagnosis not present

## 2023-10-06 DIAGNOSIS — K8071 Calculus of gallbladder and bile duct without cholecystitis with obstruction: Secondary | ICD-10-CM | POA: Diagnosis not present

## 2023-10-06 DIAGNOSIS — Z85828 Personal history of other malignant neoplasm of skin: Secondary | ICD-10-CM | POA: Diagnosis not present

## 2023-10-06 DIAGNOSIS — K8031 Calculus of bile duct with cholangitis, unspecified, with obstruction: Secondary | ICD-10-CM | POA: Diagnosis not present

## 2023-10-06 DIAGNOSIS — K2970 Gastritis, unspecified, without bleeding: Secondary | ICD-10-CM | POA: Diagnosis not present

## 2023-10-06 DIAGNOSIS — Z833 Family history of diabetes mellitus: Secondary | ICD-10-CM | POA: Diagnosis not present

## 2023-10-06 DIAGNOSIS — K8060 Calculus of gallbladder and bile duct with cholecystitis, unspecified, without obstruction: Secondary | ICD-10-CM | POA: Diagnosis not present

## 2023-10-06 DIAGNOSIS — K838 Other specified diseases of biliary tract: Secondary | ICD-10-CM | POA: Diagnosis not present

## 2023-10-06 DIAGNOSIS — I2510 Atherosclerotic heart disease of native coronary artery without angina pectoris: Secondary | ICD-10-CM | POA: Diagnosis not present

## 2023-10-06 DIAGNOSIS — E876 Hypokalemia: Secondary | ICD-10-CM | POA: Diagnosis not present

## 2023-10-06 DIAGNOSIS — I10 Essential (primary) hypertension: Secondary | ICD-10-CM | POA: Diagnosis not present

## 2023-10-06 DIAGNOSIS — K8070 Calculus of gallbladder and bile duct without cholecystitis without obstruction: Secondary | ICD-10-CM | POA: Diagnosis not present

## 2023-10-06 DIAGNOSIS — K8050 Calculus of bile duct without cholangitis or cholecystitis without obstruction: Secondary | ICD-10-CM | POA: Diagnosis not present

## 2023-10-06 DIAGNOSIS — I679 Cerebrovascular disease, unspecified: Secondary | ICD-10-CM | POA: Diagnosis not present

## 2023-10-06 DIAGNOSIS — K219 Gastro-esophageal reflux disease without esophagitis: Secondary | ICD-10-CM | POA: Diagnosis not present

## 2023-10-06 DIAGNOSIS — Z823 Family history of stroke: Secondary | ICD-10-CM | POA: Diagnosis not present

## 2023-10-06 DIAGNOSIS — Z79899 Other long term (current) drug therapy: Secondary | ICD-10-CM | POA: Diagnosis not present

## 2023-10-06 DIAGNOSIS — K819 Cholecystitis, unspecified: Secondary | ICD-10-CM | POA: Diagnosis not present

## 2023-10-06 DIAGNOSIS — Z881 Allergy status to other antibiotic agents status: Secondary | ICD-10-CM | POA: Diagnosis not present

## 2023-10-06 DIAGNOSIS — K828 Other specified diseases of gallbladder: Secondary | ICD-10-CM | POA: Diagnosis not present

## 2023-10-06 DIAGNOSIS — K8010 Calculus of gallbladder with chronic cholecystitis without obstruction: Secondary | ICD-10-CM | POA: Diagnosis not present

## 2023-10-06 DIAGNOSIS — E785 Hyperlipidemia, unspecified: Secondary | ICD-10-CM | POA: Diagnosis not present

## 2023-10-06 DIAGNOSIS — G4733 Obstructive sleep apnea (adult) (pediatric): Secondary | ICD-10-CM | POA: Diagnosis not present

## 2023-10-06 DIAGNOSIS — Z87891 Personal history of nicotine dependence: Secondary | ICD-10-CM | POA: Diagnosis not present

## 2023-10-06 DIAGNOSIS — K2990 Gastroduodenitis, unspecified, without bleeding: Secondary | ICD-10-CM | POA: Diagnosis not present

## 2023-10-06 DIAGNOSIS — K3189 Other diseases of stomach and duodenum: Secondary | ICD-10-CM | POA: Diagnosis not present

## 2023-10-06 DIAGNOSIS — I4891 Unspecified atrial fibrillation: Secondary | ICD-10-CM | POA: Diagnosis not present

## 2023-10-06 DIAGNOSIS — R932 Abnormal findings on diagnostic imaging of liver and biliary tract: Secondary | ICD-10-CM | POA: Diagnosis not present

## 2023-10-06 DIAGNOSIS — K8309 Other cholangitis: Secondary | ICD-10-CM | POA: Diagnosis not present

## 2023-10-06 LAB — CBC WITH DIFFERENTIAL/PLATELET
Abs Immature Granulocytes: 0.04 10*3/uL (ref 0.00–0.07)
Basophils Absolute: 0 10*3/uL (ref 0.0–0.1)
Basophils Relative: 0 %
Eosinophils Absolute: 0 10*3/uL (ref 0.0–0.5)
Eosinophils Relative: 0 %
HCT: 38.7 % — ABNORMAL LOW (ref 39.0–52.0)
Hemoglobin: 12.4 g/dL — ABNORMAL LOW (ref 13.0–17.0)
Immature Granulocytes: 1 %
Lymphocytes Relative: 11 %
Lymphs Abs: 0.9 10*3/uL (ref 0.7–4.0)
MCH: 30.2 pg (ref 26.0–34.0)
MCHC: 32 g/dL (ref 30.0–36.0)
MCV: 94.4 fL (ref 80.0–100.0)
Monocytes Absolute: 1.1 10*3/uL — ABNORMAL HIGH (ref 0.1–1.0)
Monocytes Relative: 14 %
Neutro Abs: 6 10*3/uL (ref 1.7–7.7)
Neutrophils Relative %: 74 %
Platelets: 151 10*3/uL (ref 150–400)
RBC: 4.1 MIL/uL — ABNORMAL LOW (ref 4.22–5.81)
RDW: 14.7 % (ref 11.5–15.5)
WBC: 8.1 10*3/uL (ref 4.0–10.5)
nRBC: 0 % (ref 0.0–0.2)

## 2023-10-06 LAB — LIPID PANEL
Cholesterol: 87 mg/dL (ref 0–200)
HDL: 32 mg/dL — ABNORMAL LOW (ref >40)
LDL Cholesterol: 46 mg/dL (ref 0–99)
Total CHOL/HDL Ratio: 2.7 {ratio}
Triglycerides: 44 mg/dL (ref <150)
VLDL: 9 mg/dL (ref 0–40)

## 2023-10-06 LAB — COMPREHENSIVE METABOLIC PANEL
ALT: 456 U/L — ABNORMAL HIGH (ref 0–44)
AST: 399 U/L — ABNORMAL HIGH (ref 15–41)
Albumin: 3.3 g/dL — ABNORMAL LOW (ref 3.5–5.0)
Alkaline Phosphatase: 239 U/L — ABNORMAL HIGH (ref 38–126)
Anion gap: 9 (ref 5–15)
BUN: 14 mg/dL (ref 8–23)
CO2: 28 mmol/L (ref 22–32)
Calcium: 9 mg/dL (ref 8.9–10.3)
Chloride: 97 mmol/L — ABNORMAL LOW (ref 98–111)
Creatinine, Ser: 0.73 mg/dL (ref 0.61–1.24)
GFR, Estimated: >60 mL/min (ref >60)
Glucose, Bld: 113 mg/dL — ABNORMAL HIGH (ref 70–99)
Potassium: 3.4 mmol/L — ABNORMAL LOW (ref 3.5–5.1)
Sodium: 134 mmol/L — ABNORMAL LOW (ref 135–145)
Total Bilirubin: 4.8 mg/dL — ABNORMAL HIGH (ref 0.3–1.2)
Total Protein: 6.6 g/dL (ref 6.5–8.1)

## 2023-10-06 LAB — LACTIC ACID, PLASMA
Lactic Acid, Venous: 1 mmol/L (ref 0.5–1.9)
Lactic Acid, Venous: 0.8 mmol/L (ref 0.5–1.9)

## 2023-10-06 LAB — SEDIMENTATION RATE: Sed Rate: 60 mm/h — ABNORMAL HIGH (ref 0–16)

## 2023-10-06 LAB — C-REACTIVE PROTEIN: CRP: 5.6 mg/dL — ABNORMAL HIGH (ref <1.0)

## 2023-10-06 LAB — LIPASE, BLOOD: Lipase: 56 U/L — ABNORMAL HIGH (ref 11–51)

## 2023-10-06 MED ORDER — PANTOPRAZOLE SODIUM 40 MG IV SOLR
40.0000 mg | INTRAVENOUS | Status: DC
  Administered 2023-10-06 – 2023-10-07 (×2): 40 mg via INTRAVENOUS
  Filled 2023-10-06 (×2): qty 10

## 2023-10-06 MED ORDER — MELATONIN 3 MG PO TABS
6.0000 mg | ORAL_TABLET | Freq: Every day | ORAL | Status: DC
  Administered 2023-10-06 – 2023-10-11 (×6): 6 mg via ORAL
  Filled 2023-10-06 (×6): qty 2

## 2023-10-06 MED ORDER — TAMSULOSIN HCL 0.4 MG PO CAPS
0.4000 mg | ORAL_CAPSULE | Freq: Every day | ORAL | Status: DC
  Administered 2023-10-06 – 2023-10-08 (×3): 0.4 mg via ORAL
  Filled 2023-10-06 (×3): qty 1

## 2023-10-06 MED ORDER — DORZOLAMIDE HCL-TIMOLOL MAL PF 2-0.5 % OP SOLN
1.0000 [drp] | Freq: Two times a day (BID) | OPHTHALMIC | Status: DC
  Administered 2023-10-07 – 2023-10-12 (×10): 1 [drp] via OPHTHALMIC

## 2023-10-06 MED ORDER — ONDANSETRON HCL 4 MG/2ML IJ SOLN: 4.0000 mg | Freq: Four times a day (QID) | INTRAMUSCULAR | Status: DC | PRN

## 2023-10-06 MED ORDER — GADOBUTROL 1 MMOL/ML IV SOLN
10.0000 mL | Freq: Once | INTRAVENOUS | Status: AC | PRN
  Administered 2023-10-06: 10 mL via INTRAVENOUS

## 2023-10-06 MED ORDER — NITROGLYCERIN 0.4 MG SL SUBL: 0.4000 mg | SUBLINGUAL_TABLET | SUBLINGUAL | Status: DC | PRN

## 2023-10-06 MED ORDER — HYDROMORPHONE HCL 1 MG/ML IJ SOLN
0.5000 mg | INTRAMUSCULAR | Status: DC | PRN
  Administered 2023-10-06: 0.5 mg via INTRAVENOUS
  Filled 2023-10-06: qty 0.5

## 2023-10-06 MED ORDER — ACETAMINOPHEN 325 MG PO TABS
650.0000 mg | ORAL_TABLET | Freq: Four times a day (QID) | ORAL | Status: DC | PRN
  Administered 2023-10-08 – 2023-10-11 (×2): 650 mg via ORAL
  Filled 2023-10-06 (×2): qty 2

## 2023-10-06 MED ORDER — POTASSIUM CHLORIDE IN NACL 20-0.9 MEQ/L-% IV SOLN
INTRAVENOUS | Status: DC
  Filled 2023-10-06 (×2): qty 1000

## 2023-10-06 MED ORDER — DILTIAZEM HCL ER COATED BEADS 240 MG PO CP24
240.0000 mg | ORAL_CAPSULE | Freq: Every day | ORAL | Status: DC
  Administered 2023-10-06 – 2023-10-12 (×7): 240 mg via ORAL
  Filled 2023-10-06 (×7): qty 1

## 2023-10-06 MED ORDER — MELATONIN 3 MG PO TABS
6.0000 mg | ORAL_TABLET | Freq: Every evening | ORAL | Status: DC | PRN
  Administered 2023-10-06: 6 mg via ORAL
  Filled 2023-10-06: qty 2

## 2023-10-06 MED ORDER — POTASSIUM CHLORIDE 10 MEQ/100ML IV SOLN
10.0000 meq | INTRAVENOUS | Status: AC
  Administered 2023-10-06 (×2): 10 meq via INTRAVENOUS
  Filled 2023-10-06 (×2): qty 100

## 2023-10-06 MED ORDER — ACETAMINOPHEN 650 MG RE SUPP: 650.0000 mg | Freq: Four times a day (QID) | RECTAL | Status: DC | PRN

## 2023-10-06 MED ORDER — NETARSUDIL DIMESYLATE 0.02 % OP SOLN
1.0000 [drp] | Freq: Every day | OPHTHALMIC | Status: DC
  Administered 2023-10-07 – 2023-10-11 (×5): 1 [drp] via OPHTHALMIC

## 2023-10-06 MED ORDER — ONDANSETRON HCL 4 MG PO TABS: 4.0000 mg | ORAL_TABLET | Freq: Four times a day (QID) | ORAL | Status: DC | PRN

## 2023-10-06 MED ORDER — TADALAFIL 5 MG PO TABS
5.0000 mg | ORAL_TABLET | Freq: Every day | ORAL | Status: DC
  Administered 2023-10-06 – 2023-10-12 (×7): 5 mg via ORAL
  Filled 2023-10-06 (×7): qty 1

## 2023-10-06 NOTE — Plan of Care (Signed)

## 2023-10-06 NOTE — Progress Notes (Signed)
The patient stated that he takes 6 mg of Melatonin hs at home. He is requesting to have it on order here/ Messaged Dr. Joneen Roach.

## 2023-10-06 NOTE — Consult Note (Addendum)
Referring Provider:  Sanda Klein, MD         Primary Care Physician:  Shon Hale, MD Primary Gastroenterologist:   Claudette Head, MD          Reason for Consultation: Cholangitis                  ASSESSMENT /  PLAN    Abdominal pain, nausea, vomiting, fevers Cholangitis Mild gallstone pancreatitis Elevated LFTs Patient presented originally with right sided abdominal pain, nausea, and vomiting, found to have elevated LFTs as well as elevated lipase.  CT and right upper quadrant ultrasound originally only showed cholelithiasis.  Follow-up MRCP showed that he does have choledocholithiasis with mild dilatation of the common bile duct as well as changes that are consistent with cholangitis.  His abdominal pain as well as nausea and vomiting appear to be significantly improving with supportive care and IV antibiotics.  Will advance his diet today to a low-fat diet to see if he is able to tolerate this.  I discussed 2 options with the patient today, including proceeding with ERCP tomorrow while on Plavix for likely bile duct stent placement (with likely another ERCP likely needed in a couple weeks for definitive treatment of his choledocholithiasis) versus ERCP on 10/23 to allow for Plavix washout and likely definitive removal of his gallstones.  Patient would like to wait and get his ERCP performed on 10/23 if possible.  We will continue to closely monitor his labs and vitals to ensure that he does not become hemodynamically unstable, which may warrant a more urgent procedure.  Patient is aware of all this. -Trend LFTs -Continue IV antibiotics -Continue supportive care -Advanced to low fat diet -Follow-up blood cultures -Will check PT/INR and acute hepatitis panel for tomorrow AM -Recommend surgery consult for consideration of cholecystectomy -Plan for ERCP on 10/23 to allow for Plavix washout.  Last dose of Plavix was on 10/18.  HPI:     Jonathan Lane is a 82 y.o. 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  Patient developed significant right sided abdominal pain along with nausea and vomiting starting yesterday.  Had a fever yesterday as well.  Today he feels much better.  Denies any nausea or vomiting today.  His abdominal pain has also subsided to a 1-2 out of 10 in severity.  Denies jaundice.  Denies changes in the color of his urine.  Denies blood in the stools.  His last dose of Plavix was yesterday.  He has never had any issues with his gallbladder in the past.  Patient has not had any alcohol for the last 4 years.  Patient's partner is at bedside.  Labs upon arrival were notable for a WBC of 11.3.  Elevated LFTs with AST 520, ALT 506, alk phos 281, and total bilirubin 3.2.  Lipase is elevated at 537. Since admission, LFTs have up trended slightly.  RUQ U/S 10/05/23: IMPRESSION: Cholelithiasis without complicating factors.  CT A/P w/contrast 10/05/23: IMPRESSION: No evidence of appendicitis or other acute findings. Cholelithiasis. No radiographic evidence of cholecystitis. Colonic diverticulosis, without radiographic evidence of diverticulitis. Stable mildly enlarged prostate. Small left renal calculus. No evidence of ureteral calculi or hydronephrosis.  MR Abdomen/MRCP w/contrast 10/06/23: IMPRESSION: 1. Today's study is positive for choledocholithiasis. This appears mildly obstructive resulting in mild dilatation of the common bile duct.  No frank intrahepatic biliary ductal dilatation noted at this time. 2. Increased T2 signal intensity associated with the portal triads, which also demonstrate increased enhancement on early post gadolinium imaging; imaging findings compatible with reported clinical history of cholangitis. 3. Cholelithiasis. Gallbladder is moderately distended with  mild gallbladder wall thickening and edema, but no frank pericholecystic fluid to clearly indicate an acute cholecystitis. 4. Additional incidental findings, as above.  Colonoscopy 12/02/14: mild diverticulosis in the sigmoid colon   EGD 08/12/18: small hiatal hernia and otherwise negative exam    Past Medical History:  Diagnosis Date   AAA (abdominal aortic aneurysm) (HCC)    Atrial fibrillation (HCC)    Bradycardia    Bradycardia    Cancer (HCC)    Cardiac murmur    Dizziness    Dysrhythmia    GERD (gastroesophageal reflux disease)    Glaucoma    Heart palpitations    Hyperlipidemia    Hypertension    Numbness 08/26/2019   LEFT FACE   Pneumonia    Skin abnormalities    pre cancerous lesion R hand   Sleep apnea    uses cpap   Snoring     Past Surgical History:  Procedure Laterality Date   APPENDECTOMY  12/19/1963   COLONOSCOPY     CYSTOSCOPY WITH BIOPSY N/A 05/16/2022   Procedure: CYSTOSCOPY WITH BIOPSY;  Surgeon: Bjorn Pippin, MD;  Location: [facility];  Service: Urology;  Laterality: N/A;   CYSTOSCOPY WITH RETROGRADE PYELOGRAM, URETEROSCOPY AND STENT PLACEMENT Bilateral 05/16/2022   Procedure: CYSTOSCOPY WITH BILATERAL RETROGRADE PYELOGRAM, LEFT URETEROSCOPY WITH BIOPSY, HOLMIUM LASER OF LEFT URETERAL STONE  AND LEFT STENT EXCHANGE, TUR LEFT URETERAL ORIFACE;  Surgeon: Bjorn Pippin, MD;  Location: [facility];  Service: Urology;  Laterality: Bilateral;  60 MINUTES   CYSTOSCOPY WITH RETROGRADE PYELOGRAM, URETEROSCOPY AND STENT PLACEMENT Left 07/26/2022   Procedure: CYSTOSCOPY WITH RETROGRADE PYELOGRAM, URETEROSCOPY AND STENT EXCHANGE;  Surgeon: Sebastian Ache, MD;  Location: [facility];  Service: Urology;  Laterality: Left;   LYMPH NODE DISSECTION Bilateral 07/26/2022   Procedure: LYMPH NODE DISSECTION;  Surgeon: Sebastian Ache, MD;  Location: [facility];  Service: Urology;  Laterality: Bilateral;   POLYPECTOMY     SKIN CANCER EXCISION     TONSILLECTOMY     as a child x2   TRANSURETHRAL  RESECTION OF BLADDER TUMOR Bilateral 01/31/2022   Procedure: RESTAGING TRANSURETHRAL RESECTION OF BLADDER TUMOR  (TURBT) LEFT URETEROSCOPY WITH LASER LEFT STENT EXCHANGE RIGHT STENT REMOVAL;  Surgeon: Bjorn Pippin, MD;  Location: [facility];  Service: Urology;  Laterality: Bilateral;   TRANSURETHRAL RESECTION OF BLADDER TUMOR WITH MITOMYCIN-C Bilateral 01/03/2022   Procedure: CYSTOSCOPY TRANSURETHRAL RESECTION OF BLADDER TUMOR WITH POSSIBLE  BILATERAL RETROGRADES STENT PLACEMENT;  Surgeon: Bjorn Pippin, MD;  Location: [facility];  Service: Urology;  Laterality: Bilateral;   WISDOM TOOTH EXTRACTION      Prior to Admission medications   Medication Sig Start Date End Date Taking? Authorizing Provider  acetaminophen (TYLENOL) 500 MG tablet Take 500 mg by mouth at bedtime.   Yes [provider]  Apoaequorin (PREVAGEN EXTRA STRENGTH) 20 MG CAPS Take 1 capsule by mouth daily.   Yes [provider]  atorvastatin (LIPITOR) 20 MG tablet Take 1 tablet (20 mg total) by mouth daily. Patient taking differently: Take 20 mg by mouth at bedtime. 08/26/19  Yes Zannie Cove, MD  Cholecalciferol 25 MCG (1000 UT) capsule Take 1,000 Units by mouth daily.   Yes [provider]  clopidogrel (PLAVIX) 75  MG tablet Take 1 tablet by mouth daily.   Yes [provider]  Coenzyme Q-10 200 MG CAPS Take 1 capsule by mouth daily.   Yes [provider]  COSOPT PF 2-0.5 % SOLN ophthalmic solution Place 1 drop into both eyes in the morning and at bedtime.   Yes [provider]  CRANBERRY PO Take 1 tablet by mouth daily.   Yes [provider]  cyanocobalamin (VITAMIN B12) 1000 MCG tablet Take 500 mcg by mouth daily.   Yes [provider]  diltiazem (DILT-XR) 240 MG 24 hr capsule Take 1 capsule (240 mg total) by mouth daily. Please call our office to schedule an yearly appointment with Dr. Eden Emms for December 2024 before anymore refills. [phone]. Thank you 1st attempt  08/14/23  Yes [name] Stade, MD  docusate sodium (COLACE) 100 MG capsule Take 1 capsule (100 mg total) by mouth 2 (two) times daily. 07/26/22  Yes Dancy, Marchelle Folks, PA-C  esomeprazole (NEXIUM) 20 MG capsule Take 20 mg by mouth 2 (two) times daily before a meal.   Yes [provider]  fluticasone (FLONASE) 50 MCG/ACT nasal spray Place 2 sprays into both nostrils daily as needed for allergies or rhinitis.   Yes [provider]  losartan-hydrochlorothiazide (HYZAAR) 100-25 MG tablet Take 1 tablet by mouth daily. 08/14/23  Yes [name] Stade, MD  Melatonin 3 MG CAPS Take 2 capsules by mouth at bedtime. 6 mg nightly   Yes [provider]  Misc Natural Products (PROSTATE HEALTH) CAPS Take 1 capsule by mouth daily.   Yes [provider]  Multiple Vitamins-Minerals (ADULT GUMMY PO) Take 2 capsules by mouth daily.   Yes [provider]  Multiple Vitamins-Minerals (PRESERVISION AREDS 2+MULTI VIT) CAPS Take 1 capsule by mouth 2 times daily at 12 noon and 4 pm.   Yes [provider]  nitroGLYCERIN (NITROSTAT) 0.4 MG SL tablet Place 1 tablet (0.4 mg total) under the tongue every 5 (five) minutes as needed for chest pain. 08/18/19  Yes Regalado, Belkys A, MD  NON FORMULARY Pt uses a cpap nightly   Yes [provider]  Omega-3 Fatty Acids (FISH OIL) 1200 MG CAPS Take 1 capsule by mouth 2 (two) times daily.   Yes [provider]  OVER THE COUNTER MEDICATION Take 1 tablet by mouth daily. Focus Factor   Yes [provider]  OVER THE COUNTER MEDICATION Take 1 capsule by mouth daily. Saffron 28 mg   Yes [provider]  Probiotic Product (ALIGN PO) Take 1 capsule by mouth in the morning.   Yes [provider]  RHOPRESSA 0.02 % SOLN Place 1 drop into both eyes at bedtime.  01/08/18  Yes [provider]  tadalafil (CIALIS) 5 MG tablet Take 5 mg by mouth daily.   Yes [provider]  tamsulosin (FLOMAX) 0.4 MG  CAPS capsule Take 1 capsule (0.4 mg total) by mouth daily. 08/08/22  Yes Crist Fat, MD  valACYclovir (VALTREX) 1000 MG tablet Take 1,000 mg by mouth 2 (two) times daily as needed (for outbreaks).   Yes [provider]  Zinc 25 MG TABS Take 1 tablet by mouth daily.   Yes [provider]  apixaban (ELIQUIS) 5 MG TABS tablet TAKE ONE TABLET BY MOUTH TWICE DAILY START WHEN FINISHED STARTER PACK Patient not taking: Reported on 10/06/2023 03/01/23   Graceann Congress, PA-C    Current Facility-Administered Medications  Medication Dose Route Frequency Provider Last Rate Last Admin  acetaminophen (TYLENOL) tablet 650 mg  650 mg Oral Q6H PRN Bobette Mo, MD       Or   acetaminophen (TYLENOL) suppository 650 mg  650 mg Rectal Q6H PRN Bobette Mo, MD       Dorzolamide HCl-Timolol Mal PF 2-0.5 % SOLN 1 drop  1 drop Both Eyes BID Bobette Mo, MD       HYDROmorphone (DILAUDID) injection 0.5 mg  0.5 mg Intravenous Q3H PRN Gery Pray, MD       melatonin tablet 6 mg  6 mg Oral QHS PRN Gery Pray, MD   6 mg at 10/06/23 0518   Netarsudil Dimesylate 0.02 % SOLN 1 drop  1 drop Both Eyes QHS Bobette Mo, MD       ondansetron [facility]) tablet 4 mg  4 mg Oral Q6H PRN Bobette Mo, MD       Or   ondansetron [facility]) injection 4 mg  4 mg Intravenous Q6H PRN Bobette Mo, MD       pantoprazole (PROTONIX) injection 40 mg  40 mg Intravenous Q24H Bobette Mo, MD   40 mg at 10/06/23 0949   piperacillin-tazobactam (ZOSYN) IVPB 3.375 g  3.375 g Intravenous Q8H Fulton Reek H, MD 12.5 mL/hr at 10/06/23 1315 3.375 g at 10/06/23 1315    Allergies as of 10/05/2023 - Review Complete 10/05/2023  Allergen Reaction Noted   Brimonidine tartrate-timolol Itching, Rash, and Other (See Comments) 12/19/2003   Brimonidine tartrate Other (See Comments) 11/25/2021   Tetracycline hcl Other (See Comments) 11/25/2021   Ciprofloxacin Other (See Comments)  11/25/2021    Family History  Problem Relation Age of Onset   Stroke Father    Stroke Mother    Stroke Sister    Breast cancer Sister    High blood pressure Sister    Diabetes Sister    Colon cancer Other        Uncle   Colon cancer Paternal Uncle    Mitral valve prolapse Other        sugery 09-27-2017   Rectal cancer Neg Hx    Stomach cancer Neg Hx     Social History   Tobacco Use   Smoking status: Former    Current packs/day: 0.00    Types: Cigarettes    Quit date: 12/18/1965    Years since quitting: 57.8    Passive exposure: Never   Smokeless tobacco: Never  Vaping Use   Vaping status: Never Used  Substance Use Topics   Alcohol use: Not Currently    Comment: 5-7 drinks per week   Drug use: No    Review of Systems: All systems reviewed and negative except where noted in HPI.  Physical Exam: Vital signs in last 24 hours: Temp:  [98.3 F (36.8 C)-99.2 F (37.3 C)] 98.4 F (36.9 C) (10/19 1425) Pulse Rate:  [64-76] 64 (10/19 1425) Resp:  [14-18] 18 (10/19 1425) BP: (148-162)/(65-77) 151/73 (10/19 1425) SpO2:  [94 %-97 %] 94 % (10/19 1425) FiO2 (%):  [21 %] 21 % (10/19 0145) Weight:  [88.5 kg] 88.5 kg (10/19 0525) Last BM Date : 10/05/23 General:   Awake, alert, NAD Psych:  Pleasant, cooperative. Normal mood and affect. Eyes:  Pupils equal, scleral icterus Neck:  Supple Lungs: No increased work of breathing Heart:  Regular rate  Abdomen:  Soft, non-distended, very mildly tender in the right upper quadrant. Msk:  Symmetrical without gross deformities. . Neurologic:  Alert and  oriented x4;  grossly normal neurologically. Skin:  Intact without significant lesions or rashes.   Intake/Output from previous day: 10/18 0701 - 10/19 0700 In: 962.5 [I.V.:412.5; IV Piggyback:550] Out: 1 [Urine:1] Intake/Output this shift: No intake/output data recorded.  Lab Results: Recent Labs    10/05/23 1742 10/06/23 0317  WBC 11.3* 8.1  HGB 14.0 12.4*  HCT 41.8  38.7*  PLT 184 151   BMET Recent Labs    10/05/23 1742 10/06/23 0317  NA 134* 134*  K 3.6 3.4*  CL 96* 97*  CO2 28 28  GLUCOSE 153* 113*  BUN 14 14  CREATININE 0.75 0.73  CALCIUM 9.7 9.0   LFT Recent Labs    10/06/23 0317  PROT 6.6  ALBUMIN 3.3*  AST 399*  ALT 456*  ALKPHOS 239*  BILITOT 4.8*   PT/INR No results for input(s): "LABPROT", "INR" in the last 72 hours. Hepatitis Panel No results for input(s): "HEPBSAG", "HCVAB", "HEPAIGM", "HEPBIGM" in the last 72 hours.   .    Latest Ref Rng & Units 10/06/2023    3:17 AM 10/05/2023    5:42 PM 08/08/2022    6:28 AM  CBC  WBC 4.0 - 10.5 K/uL 8.1  11.3    Hemoglobin 13.0 - 17.0 g/dL 16.1  09.6  04.5   Hematocrit 39.0 - 52.0 % 38.7  41.8  37.0   Platelets 150 - 400 K/uL 151  184      .    Latest Ref Rng & Units 10/06/2023    3:17 AM 10/05/2023    5:42 PM 08/08/2022    6:28 AM  CMP  Glucose 70 - 99 mg/dL 409  811  914   BUN 8 - 23 mg/dL 14  14  16    Creatinine 0.61 - 1.24 mg/dL 7.82  9.56  2.13   Sodium 135 - 145 mmol/L 134  134  137   Potassium 3.5 - 5.1 mmol/L 3.4  3.6  3.6   Chloride 98 - 111 mmol/L 97  96  100   CO2 22 - 32 mmol/L 28  28    Calcium 8.9 - 10.3 mg/dL 9.0  9.7    Total Protein 6.5 - 8.1 g/dL 6.6  7.6    Total Bilirubin 0.3 - 1.2 mg/dL 4.8  3.2    Alkaline Phos 38 - 126 U/L 239  281    AST 15 - 41 U/L 399  520    ALT 0 - 44 U/L 456  506     Studies/Results: MR ABDOMEN MRCP W WO CONTAST  Result Date: 10/06/2023 CLINICAL DATA:  82 year old male with history of bladder cancer presenting with signs and symptoms concerning for potential ascending cholangitis. EXAM: MRI ABDOMEN WITHOUT AND WITH CONTRAST (INCLUDING MRCP) TECHNIQUE: Multiplanar multisequence MR imaging of the abdomen was performed both before and after the administration of intravenous contrast. Heavily T2-weighted images of the biliary and pancreatic ducts were obtained, and three-dimensional MRCP images were rendered by post  processing. CONTRAST:  10mL GADAVIST GADOBUTROL 1 MMOL/ML IV SOLN COMPARISON:  No prior abdominal MRI. CT of the abdomen and pelvis 10/05/2023. Abdominal ultrasound 10/05/2023. FINDINGS: Lower chest: Unremarkable. Hepatobiliary: No suspicious cystic or solid hepatic lesions. However, there is increased T2 signal intensity throughout the liver in a periportal distribution, which also demonstrates increased enhancement during early post gadolinium imaging, concerning for cholangitis. Filling defect lying dependently in the gallbladder measuring 1.4 cm in length compatible with a gallstone. Gallbladder is moderately distended. Gallbladder wall appears minimally thickened and  edematous, although there is no frank pericholecystic fluid or overt surrounding inflammatory changes. MRCP images demonstrates some mild dilatation of the common bile duct which measures 9 mm. In the distal common bile duct there is at least 1 filling defect measuring up to 6 mm, indicative of choledocholithiasis. No intrahepatic biliary ductal dilatation noted at this time. Pancreas: No pancreatic mass. No pancreatic ductal dilatation noted on MRCP images. No pancreatic or peripancreatic fluid collections or inflammatory changes. Spleen:  Unremarkable. Adrenals/Urinary Tract: 1.3 cm T1 hypointense, T2 hyperintense, nonenhancing lesion in the interpolar region of the right kidney is compatible with a simple cyst (Bosniak class 1), no imaging follow-up recommended. Left kidney and bilateral adrenal glands are otherwise normal in appearance. No hydroureteronephrosis in the visualized portions of the abdomen. Stomach/Bowel: Visualized portions are unremarkable. Vascular/Lymphatic: Aortic atherosclerosis. No aneurysm identified in the visualized abdominal vasculature. No lymphadenopathy noted in the abdomen. Other: No significant volume of ascites noted in the visualized portions of the peritoneal cavity. Musculoskeletal: No aggressive appearing  osseous lesions are noted in the visualized portions of the skeleton. IMPRESSION: 1. Today's study is positive for choledocholithiasis. This appears mildly obstructive resulting in mild dilatation of the common bile duct. No frank intrahepatic biliary ductal dilatation noted at this time. 2. Increased T2 signal intensity associated with the portal triads, which also demonstrate increased enhancement on early post gadolinium imaging; imaging findings compatible with reported clinical history of cholangitis. 3. Cholelithiasis. Gallbladder is moderately distended with mild gallbladder wall thickening and edema, but no frank pericholecystic fluid to clearly indicate an acute cholecystitis. 4. Additional incidental findings, as above. Electronically Signed   By: Trudie Reed M.D.   On: 10/06/2023 10:55   MR 3D Recon At Scanner  Result Date: 10/06/2023 CLINICAL DATA:  82 year old male with history of bladder cancer presenting with signs and symptoms concerning for potential ascending cholangitis. EXAM: MRI ABDOMEN WITHOUT AND WITH CONTRAST (INCLUDING MRCP) TECHNIQUE: Multiplanar multisequence MR imaging of the abdomen was performed both before and after the administration of intravenous contrast. Heavily T2-weighted images of the biliary and pancreatic ducts were obtained, and three-dimensional MRCP images were rendered by post processing. CONTRAST:  10mL GADAVIST GADOBUTROL 1 MMOL/ML IV SOLN COMPARISON:  No prior abdominal MRI. CT of the abdomen and pelvis 10/05/2023. Abdominal ultrasound 10/05/2023. FINDINGS: Lower chest: Unremarkable. Hepatobiliary: No suspicious cystic or solid hepatic lesions. However, there is increased T2 signal intensity throughout the liver in a periportal distribution, which also demonstrates increased enhancement during early post gadolinium imaging, concerning for cholangitis. Filling defect lying dependently in the gallbladder measuring 1.4 cm in length compatible with a gallstone.  Gallbladder is moderately distended. Gallbladder wall appears minimally thickened and edematous, although there is no frank pericholecystic fluid or overt surrounding inflammatory changes. MRCP images demonstrates some mild dilatation of the common bile duct which measures 9 mm. In the distal common bile duct there is at least 1 filling defect measuring up to 6 mm, indicative of choledocholithiasis. No intrahepatic biliary ductal dilatation noted at this time. Pancreas: No pancreatic mass. No pancreatic ductal dilatation noted on MRCP images. No pancreatic or peripancreatic fluid collections or inflammatory changes. Spleen:  Unremarkable. Adrenals/Urinary Tract: 1.3 cm T1 hypointense, T2 hyperintense, nonenhancing lesion in the interpolar region of the right kidney is compatible with a simple cyst (Bosniak class 1), no imaging follow-up recommended. Left kidney and bilateral adrenal glands are otherwise normal in appearance. No hydroureteronephrosis in the visualized portions of the abdomen. Stomach/Bowel: Visualized portions are unremarkable. Vascular/Lymphatic: Aortic  atherosclerosis. No aneurysm identified in the visualized abdominal vasculature. No lymphadenopathy noted in the abdomen. Other: No significant volume of ascites noted in the visualized portions of the peritoneal cavity. Musculoskeletal: No aggressive appearing osseous lesions are noted in the visualized portions of the skeleton. IMPRESSION: 1. Today's study is positive for choledocholithiasis. This appears mildly obstructive resulting in mild dilatation of the common bile duct. No frank intrahepatic biliary ductal dilatation noted at this time. 2. Increased T2 signal intensity associated with the portal triads, which also demonstrate increased enhancement on early post gadolinium imaging; imaging findings compatible with reported clinical history of cholangitis. 3. Cholelithiasis. Gallbladder is moderately distended with mild gallbladder wall  thickening and edema, but no frank pericholecystic fluid to clearly indicate an acute cholecystitis. 4. Additional incidental findings, as above. Electronically Signed   By: Trudie Reed M.D.   On: 10/06/2023 10:55   US Abdomen Limited RUQ (LIVER/GB)  Result Date: 10/05/2023 CLINICAL DATA:  Abdominal pain EXAM: ULTRASOUND ABDOMEN LIMITED RIGHT UPPER QUADRANT COMPARISON:  CT from earlier in the same day. FINDINGS: Gallbladder: Cholelithiasis is again identified. No wall thickening or pericholecystic fluid is noted. Negative sonographic Murphy's sign is noted. Common bile duct: Diameter: 3 mm Liver: No focal lesion identified. Within normal limits in parenchymal echogenicity. Portal vein is patent on color Doppler imaging with normal direction of blood flow towards the liver. Other: None. IMPRESSION: Cholelithiasis without complicating factors. Electronically Signed   By: Alcide Clever M.D.   On: 10/05/2023 21:31   CT ABDOMEN PELVIS W CONTRAST  Result Date: 10/05/2023 CLINICAL DATA:  Right lower quadrant pain.  Fever. EXAM: CT ABDOMEN AND PELVIS WITH CONTRAST TECHNIQUE: Multidetector CT imaging of the abdomen and pelvis was performed using the standard protocol following bolus administration of intravenous contrast. RADIATION DOSE REDUCTION: This exam was performed according to the departmental dose-optimization program which includes automated exposure control, adjustment of the mA and/or kV according to patient size and/or use of iterative reconstruction technique. CONTRAST:  OMNIPAQUE IOHEXOL 300 MG/ML  SOLN COMPARISON:  02/19/2023 FINDINGS: Lower Chest: No acute findings. Hepatobiliary: No suspicious hepatic masses identified. Gallstones are seen, however there is no evidence of cholecystitis or biliary dilatation. Pancreas:  No mass or inflammatory changes. Spleen: Within normal limits in size and appearance. Adrenals/Urinary Tract: No suspicious masses identified. 4 mm calculus again seen in  lower pole of left kidney. No evidence of ureteral calculi or hydronephrosis. Unremarkable unopacified urinary bladder. Stomach/Bowel: No evidence of obstruction, inflammatory process or abnormal fluid collections. Diverticulosis is seen mainly involving the sigmoid colon, however there is no evidence of diverticulitis. Although the appendix is not directly visualized, no inflammatory process seen in region of the cecum or elsewhere. Vascular/Lymphatic: Stable shotty sub-centimeter retroperitoneal lymph nodes. No pathologically enlarged lymph nodes. No acute vascular findings. Reproductive:  Stable mildly enlarged prostate. Other:  None. Musculoskeletal:  No suspicious bone lesions identified. IMPRESSION: No evidence of appendicitis or other acute findings. Cholelithiasis. No radiographic evidence of cholecystitis. Colonic diverticulosis, without radiographic evidence of diverticulitis. Stable mildly enlarged prostate. Small left renal calculus. No evidence of ureteral calculi or hydronephrosis. Electronically Signed   By: Danae Orleans M.D.   On: 10/05/2023 20:13    Principal Problem:   Cholangitis Active Problems:   Essential hypertension   ATRIAL FIBRILLATION   OSA on CPAP   Bladder cancer [facility])    Eulah Pont, M.D. @  10/06/2023, 2:26 PM

## 2023-10-06 NOTE — Progress Notes (Signed)
   10/06/23 0145  BiPAP/CPAP/SIPAP  $ Non-Invasive Home Ventilator  Initial  $ Face Mask Medium Yes (Nasal)  BiPAP/CPAP/SIPAP Pt Type Adult  BiPAP/CPAP/SIPAP DREAMSTATIOND  Mask Type Nasal mask  Mask Size Medium  Respiratory Rate 16 breaths/min  EPAP 8 cmH2O (Ramped from 4cmh20)  FiO2 (%) 21 %  Flow Rate 0 lpm  Patient Home Equipment No  Auto Titrate No   Pt. From ED, CPAP uses h/s, agreed to use for remained of evening, RN aware tolerating well, made aware to notify in needed.

## 2023-10-06 NOTE — Progress Notes (Signed)
   10/06/23 2231  BiPAP/CPAP/SIPAP  BiPAP/CPAP/SIPAP Pt Type Adult  BiPAP/CPAP/SIPAP DREAMSTATIOND  Mask Type Nasal mask  Mask Size Medium  Respiratory Rate 20 breaths/min  EPAP 5 cmH2O (decreased to 5 cmH2O for pt comfort)  FiO2 (%) 21 %  Patient Home Equipment No  Auto Titrate No

## 2023-10-06 NOTE — H&P (Signed)
History and Physical    Patient: Jonathan Lane [id number]:[id number] DOB: [birth year] DOA: 10/05/2023 DOS: the patient was seen and examined on 10/06/2023 PCP: Shon Hale, MD  Patient coming from: Home  Chief Complaint:  Chief Complaint  Patient presents with   Abdominal Pain   HPI: Jonathan Lane is a 82 y.o. male with medical history significant of AAA, atrial fibrillation, bradycardia, cardiac murmur, dizziness, GERD, glaucoma, hyperlipidemia, hypertension, history of pneumonia, skin cancer, sleep apnea on CPAP who presented to the emergency department with complaints of abdominal pain since Sunday, nausea and emesis yesterday.  He went to see his doctor yesterday who found him to be febrile and referred him to the emergency department.   No  diarrhea, constipation, melena or hematochezia.  No flank pain, dysuria, frequency or hematuria.  He rhinorrhea, sore throat, wheezing or hemoptysis.  No chest pain, palpitations, diaphoresis, PND, orthopnea or pitting edema of the lower extremities..  No polyuria, polydipsia, polyphagia or blurred vision.   Lab work: Urinalysis shows small bilirubin and proteinuria 30 mg deciliter.  CBC showed a white count 11.3, hemoglobin 14.0 g/dL and platelets 784.  Lipase was 537 then 56 units/L.  Lactic acid was normal.  CMP with normal electrolytes after sodium correction, normal renal function, glucose 153 and total bilirubin 3.2 mg/dL.  Total protein 7.6 and albumin 4.2 g/dL.  AST 520, ALT 506 and alkaline phosphatase 281 units/L.  Imaging: CT abdomen/pelvis with contrast with no evidence of appendicitis or other acute findings there was cholelithiasis without evidence of cholecystitis.  Diverticulosis without diverticulitis.  Mildly enlarged prostate.  Small renal calculus in the lower pole of the left kidney without evidence of ureteral calculi or hydronephrosis.  RUQ US showed cholelithiasis without complicating factors.  MRCP still pending.   ED  course: Initial vital signs were temperature 98.8 F, pulse 72, respiration 14, BP 162/77 mmHg O2 sat 97% on room air.  The patient received LR 500 mL bolus, morphine 4 mg IVP ondansetron 4 mg IVP and was started on Zosyn.  Review of Systems: As mentioned in the history of present illness. All other systems reviewed and are negative. Past Medical History:  Diagnosis Date   AAA (abdominal aortic aneurysm) (HCC)    Atrial fibrillation (HCC)    Bradycardia    Bradycardia    Cancer (HCC)    Cardiac murmur    Dizziness    Dysrhythmia    GERD (gastroesophageal reflux disease)    Glaucoma    Heart palpitations    Hyperlipidemia    Hypertension    Numbness 08/26/2019   LEFT FACE   Pneumonia    Skin abnormalities    pre cancerous lesion R hand   Sleep apnea    uses cpap   Snoring    Past Surgical History:  Procedure Laterality Date   APPENDECTOMY  12/19/1963   COLONOSCOPY     CYSTOSCOPY WITH BIOPSY N/A 05/16/2022   Procedure: CYSTOSCOPY WITH BIOPSY;  Surgeon: Bjorn Pippin, MD;  Location: [facility];  Service: Urology;  Laterality: N/A;   CYSTOSCOPY WITH RETROGRADE PYELOGRAM, URETEROSCOPY AND STENT PLACEMENT Bilateral 05/16/2022   Procedure: CYSTOSCOPY WITH BILATERAL RETROGRADE PYELOGRAM, LEFT URETEROSCOPY WITH BIOPSY, HOLMIUM LASER OF LEFT URETERAL STONE  AND LEFT STENT EXCHANGE, TUR LEFT URETERAL ORIFACE;  Surgeon: Bjorn Pippin, MD;  Location: [facility];  Service: Urology;  Laterality: Bilateral;  60 MINUTES   CYSTOSCOPY WITH RETROGRADE PYELOGRAM, URETEROSCOPY AND STENT PLACEMENT Left 07/26/2022   Procedure: CYSTOSCOPY WITH RETROGRADE PYELOGRAM,  URETEROSCOPY AND STENT EXCHANGE;  Surgeon: Sebastian Ache, MD;  Location: [facility];  Service: Urology;  Laterality: Left;   LYMPH NODE DISSECTION Bilateral 07/26/2022   Procedure: LYMPH NODE DISSECTION;  Surgeon: Sebastian Ache, MD;  Location: [facility];  Service: Urology;  Laterality: Bilateral;   POLYPECTOMY     SKIN CANCER EXCISION     TONSILLECTOMY     as  a child x2   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   Procedure: RESTAGING TRANSURETHRAL RESECTION OF BLADDER TUMOR  (TURBT) LEFT URETEROSCOPY WITH LASER LEFT STENT EXCHANGE RIGHT STENT REMOVAL;  Surgeon: Bjorn Pippin, MD;  Location: [facility];  Service: Urology;  Laterality: Bilateral;   TRANSURETHRAL RESECTION OF BLADDER TUMOR WITH MITOMYCIN-C Bilateral 01/03/2022   Procedure: CYSTOSCOPY TRANSURETHRAL RESECTION OF BLADDER TUMOR WITH POSSIBLE  BILATERAL RETROGRADES STENT PLACEMENT;  Surgeon: Bjorn Pippin, MD;  Location: [facility];  Service: Urology;  Laterality: Bilateral;   WISDOM TOOTH EXTRACTION     Social History:  reports that he quit smoking about 57 years ago. His smoking use included cigarettes. He has never been exposed to tobacco smoke. He has never used smokeless tobacco. He reports that he does not currently use alcohol. He reports that he does not use drugs.  Allergies  Allergen Reactions   Brimonidine Tartrate-Timolol Itching, Rash and Other (See Comments)    Itching eyes Other reaction(s): Not available   Brimonidine Tartrate Other (See Comments)    Irritates the eyes and increased pressure   Tetracycline Hcl Other (See Comments)    Doesn't remember reaction-"like 55 years ago"    Ciprofloxacin Other (See Comments)    Constipation    Family History  Problem Relation Age of Onset   Stroke Father    Stroke Mother    Stroke Sister    Breast cancer Sister    High blood pressure Sister    Diabetes Sister    Colon cancer Other        Uncle   Colon cancer Paternal Uncle    Mitral valve prolapse Other        sugery 09-27-2017   Rectal cancer Neg Hx    Stomach cancer Neg Hx     Prior to Admission medications   Medication Sig Start Date End Date Taking? Authorizing Provider  acetaminophen (TYLENOL) 500 MG tablet Take 500 mg by mouth at bedtime.   Yes [provider]  Apoaequorin (PREVAGEN EXTRA STRENGTH) 20 MG CAPS Take 1 capsule by mouth  daily.   Yes [provider]  atorvastatin (LIPITOR) 20 MG tablet Take 1 tablet (20 mg total) by mouth daily. Patient taking differently: Take 20 mg by mouth at bedtime. 08/26/19  Yes Zannie Cove, MD  Cholecalciferol 25 MCG (1000 UT) capsule Take 1,000 Units by mouth daily.   Yes [provider]  clopidogrel (PLAVIX) 75 MG tablet Take 1 tablet by mouth daily.   Yes [provider]  Coenzyme Q-10 200 MG CAPS Take 1 capsule by mouth daily.   Yes [provider]  COSOPT PF 2-0.5 % SOLN ophthalmic solution Place 1 drop into both eyes in the morning and at bedtime.   Yes [provider]  CRANBERRY PO Take 1 tablet by mouth daily.   Yes [provider]  cyanocobalamin (VITAMIN B12) 1000 MCG tablet Take 500 mcg by mouth daily.   Yes [provider]  diltiazem (DILT-XR) 240 MG 24 hr capsule Take 1 capsule (240 mg total) by mouth daily. Please call our office  to schedule an yearly appointment with Dr. Eden Emms for December 2024 before anymore refills. [phone]. Thank you 1st attempt 08/14/23  Yes [name] Stade, MD  docusate sodium (COLACE) 100 MG capsule Take 1 capsule (100 mg total) by mouth 2 (two) times daily. 07/26/22  Yes Dancy, Marchelle Folks, PA-C  esomeprazole (NEXIUM) 20 MG capsule Take 20 mg by mouth 2 (two) times daily before a meal.   Yes [provider]  fluticasone (FLONASE) 50 MCG/ACT nasal spray Place 2 sprays into both nostrils daily as needed for allergies or rhinitis.   Yes [provider]  losartan-hydrochlorothiazide (HYZAAR) 100-25 MG tablet Take 1 tablet by mouth daily. 08/14/23  Yes [name] Stade, MD  Melatonin 3 MG CAPS Take 2 capsules by mouth at bedtime. 6 mg nightly   Yes [provider]  Misc Natural Products (PROSTATE HEALTH) CAPS Take 1 capsule by mouth daily.   Yes [provider]  Multiple Vitamins-Minerals (ADULT GUMMY PO) Take 2 capsules by mouth daily.   Yes [provider]  Multiple Vitamins-Minerals (PRESERVISION AREDS 2+MULTI VIT) CAPS Take 1 capsule by mouth 2 times daily at 12 noon and 4 pm.   Yes [provider]  nitroGLYCERIN (NITROSTAT) 0.4 MG SL tablet Place 1 tablet (0.4 mg total) under the tongue every 5 (five) minutes as needed for chest pain. 08/18/19  Yes Regalado, Belkys A, MD  NON FORMULARY Pt uses a cpap nightly   Yes [provider]  Omega-3 Fatty Acids (FISH OIL) 1200 MG CAPS Take 1 capsule by mouth 2 (two) times daily.   Yes [provider]  OVER THE COUNTER MEDICATION Take 1 tablet by mouth daily. Focus Factor   Yes [provider]  OVER THE COUNTER MEDICATION Take 1 capsule by mouth daily. Saffron 28 mg   Yes [provider]  Probiotic Product (ALIGN PO) Take 1 capsule by mouth in the morning.   Yes [provider]  RHOPRESSA 0.02 % SOLN Place 1 drop into both eyes at bedtime.  01/08/18  Yes [provider]  tadalafil (CIALIS) 5 MG tablet Take 5 mg by mouth daily.   Yes [provider]  tamsulosin (FLOMAX) 0.4 MG CAPS capsule Take 1 capsule (0.4 mg total) by mouth daily. 08/08/22  Yes Crist Fat, MD  valACYclovir (VALTREX) 1000 MG tablet Take 1,000 mg by mouth 2 (two) times daily as needed (for outbreaks).   Yes [provider]  Zinc 25 MG TABS Take 1 tablet by mouth daily.   Yes [provider]  apixaban (ELIQUIS) 5 MG TABS tablet TAKE ONE TABLET BY MOUTH TWICE DAILY START WHEN FINISHED STARTER PACK Patient not taking: Reported on 10/06/2023 03/01/23   Graceann Congress, PA-C    Physical Exam: Vitals:   10/05/23 2100 10/06/23 0009 10/06/23 0350 10/06/23 0525  BP: (!) 148/65 (!) 161/76 (!) 160/67   Pulse: 71 76 72   Resp: 18 18 18    Temp: 98.5 F (36.9 C) 98.3 F (36.8 C) 99.2 F (37.3 C)   TempSrc: Oral Oral Oral   SpO2: 94% 97% 96%   Weight:    88.5 kg  Height:    6\' 1"  (1.854 m)   Physical Exam Vitals and nursing note reviewed.   Constitutional:      General: He is awake. He is not in acute distress.    Appearance: He is well-developed. He is ill-appearing.  HENT:     Head: Normocephalic.     Nose: No  rhinorrhea.     Mouth/Throat:     Mouth: Mucous membranes are dry.  Eyes:     General: Scleral icterus present.     Pupils: Pupils are equal, round, and reactive to light.  Neck:     Vascular: No JVD.  Cardiovascular:     Rate and Rhythm: Normal rate and regular rhythm.     Heart sounds: S1 normal and S2 normal.  Pulmonary:     Effort: Pulmonary effort is normal.     Breath sounds: Normal breath sounds. No wheezing, rhonchi or rales.  Abdominal:     General: Bowel sounds are normal. There is no distension.     Palpations: Abdomen is soft.     Tenderness: There is abdominal tenderness in the right upper quadrant. There is no right CVA tenderness, left CVA tenderness, guarding or rebound.  Musculoskeletal:     Cervical back: Neck supple.     Right lower leg: No edema.     Left lower leg: No edema.  Skin:    General: Skin is warm and dry.  Neurological:     General: No focal deficit present.     Mental Status: He is alert and oriented to person, place, and time.  Psychiatric:        Mood and Affect: Mood normal.        Behavior: Behavior normal. Behavior is cooperative.    Data Reviewed:  Results are pending, will review when available.  Assessment and Plan: Principal Problem:   Fever associated with   Elevated LFTs In the setting of:   Cholangitis   Acute biliary pancreatitis Observation/MedSurg. Continue IV fluids. Hold statin. Diet per GI. Analgesics as needed. Antiemetics as needed. Pantoprazole 40 mg IVP daily. Follow CBC, CMP and lipase in AM. GI consult appreciated. -Will follow their recommendations. Case briefly discussed with general surgery ([place]). -General surgery asked [place] to consult when ERCP done.  Active Problems:   Essential hypertension Hold losartan and diuretic  today. Continue diltiazem 240 mg p.o. daily.    ATRIAL FIBRILLATION Off apixaban. Continue diltiazem for rate control.    OSA on CPAP Continue CPAP at bedtime.    GERD On parenteral PPI.     Advance Care Planning:   Code Status: Full Code   Consults: Gastroenterology Norwood Levo, MD).  Family Communication:   Severity of Illness: The appropriate patient status for this patient is INPATIENT. Inpatient status is judged to be reasonable and necessary in order to provide the required intensity of service to ensure the patient's safety. The patient's presenting symptoms, physical exam findings, and initial radiographic and laboratory data in the context of their chronic comorbidities is felt to place them at high risk for further clinical deterioration. Furthermore, it is not anticipated that the patient will be medically stable for discharge from the hospital within 2 midnights of admission.   * I certify that at the point of admission it is my clinical judgment that the patient will require inpatient hospital care spanning beyond 2 midnights from the point of admission due to high intensity of service, high risk for further deterioration and high frequency of surveillance required.*  Author: Bobette Mo, MD 10/06/2023 8:21 AM  For on call review [email or web address].   This document was prepared using Dragon voice recognition software and may contain some unintended transcription errors.

## 2023-10-06 NOTE — Plan of Care (Signed)
Problem: Education: Goal: Knowledge of General Education information will improve Description: Including pain rating scale, medication(s)/side effects and non-pharmacologic comfort measures Outcome: Progressing   Problem: Clinical Measurements: Goal: Ability to maintain clinical measurements within normal limits will improve Outcome: Progressing   Problem: Coping: Goal: Level of anxiety will decrease Outcome: Progressing   Problem: Safety: Goal: Ability to remain free from injury will improve Outcome: Progressing   Haydee Salter, RN 10/06/23 11:12 AM

## 2023-10-07 DIAGNOSIS — K851 Biliary acute pancreatitis without necrosis or infection: Secondary | ICD-10-CM | POA: Diagnosis not present

## 2023-10-07 DIAGNOSIS — K8309 Other cholangitis: Secondary | ICD-10-CM | POA: Diagnosis not present

## 2023-10-07 DIAGNOSIS — I10 Essential (primary) hypertension: Secondary | ICD-10-CM | POA: Diagnosis not present

## 2023-10-07 DIAGNOSIS — G4733 Obstructive sleep apnea (adult) (pediatric): Secondary | ICD-10-CM

## 2023-10-07 DIAGNOSIS — I679 Cerebrovascular disease, unspecified: Secondary | ICD-10-CM | POA: Diagnosis not present

## 2023-10-07 LAB — COMPREHENSIVE METABOLIC PANEL
ALT: 301 U/L — ABNORMAL HIGH (ref 0–44)
AST: 202 U/L — ABNORMAL HIGH (ref 15–41)
Albumin: 2.8 g/dL — ABNORMAL LOW (ref 3.5–5.0)
Alkaline Phosphatase: 209 U/L — ABNORMAL HIGH (ref 38–126)
Anion gap: 8 (ref 5–15)
BUN: 15 mg/dL (ref 8–23)
CO2: 25 mmol/L (ref 22–32)
Calcium: 8.3 mg/dL — ABNORMAL LOW (ref 8.9–10.3)
Chloride: 101 mmol/L (ref 98–111)
Creatinine, Ser: 0.72 mg/dL (ref 0.61–1.24)
GFR, Estimated: >60 mL/min (ref >60)
Glucose, Bld: 115 mg/dL — ABNORMAL HIGH (ref 70–99)
Potassium: 3.6 mmol/L (ref 3.5–5.1)
Sodium: 134 mmol/L — ABNORMAL LOW (ref 135–145)
Total Bilirubin: 5 mg/dL — ABNORMAL HIGH (ref 0.3–1.2)
Total Protein: 6.1 g/dL — ABNORMAL LOW (ref 6.5–8.1)

## 2023-10-07 LAB — HEPATITIS PANEL, ACUTE
HCV Ab: NONREACTIVE
Hep A IgM: NONREACTIVE
Hep B C IgM: NONREACTIVE
Hepatitis B Surface Ag: NONREACTIVE

## 2023-10-07 LAB — LIPASE, BLOOD: Lipase: 79 U/L — ABNORMAL HIGH (ref 11–51)

## 2023-10-07 LAB — PROTIME-INR
INR: 1.2 (ref 0.8–1.2)
Prothrombin Time: 15.5 s — ABNORMAL HIGH (ref 11.4–15.2)

## 2023-10-07 LAB — CBC
HCT: 35.1 % — ABNORMAL LOW (ref 39.0–52.0)
Hemoglobin: 11.4 g/dL — ABNORMAL LOW (ref 13.0–17.0)
MCH: 30.6 pg (ref 26.0–34.0)
MCHC: 32.5 g/dL (ref 30.0–36.0)
MCV: 94.1 fL (ref 80.0–100.0)
Platelets: 144 10*3/uL — ABNORMAL LOW (ref 150–400)
RBC: 3.73 MIL/uL — ABNORMAL LOW (ref 4.22–5.81)
RDW: 14.9 % (ref 11.5–15.5)
WBC: 7.3 10*3/uL (ref 4.0–10.5)
nRBC: 0 % (ref 0.0–0.2)

## 2023-10-07 MED ORDER — WHITE PETROLATUM EX OINT
TOPICAL_OINTMENT | CUTANEOUS | Status: DC | PRN
  Filled 2023-10-07 (×4): qty 5

## 2023-10-07 MED ORDER — ATORVASTATIN CALCIUM 20 MG PO TABS
20.0000 mg | ORAL_TABLET | Freq: Every day | ORAL | Status: DC
  Administered 2023-10-07 – 2023-10-11 (×5): 20 mg via ORAL
  Filled 2023-10-07 (×6): qty 1

## 2023-10-07 MED ORDER — ENOXAPARIN SODIUM 40 MG/0.4ML IJ SOSY
40.0000 mg | PREFILLED_SYRINGE | INTRAMUSCULAR | Status: AC
  Administered 2023-10-07 – 2023-10-09 (×3): 40 mg via SUBCUTANEOUS
  Filled 2023-10-07 (×3): qty 0.4

## 2023-10-07 MED ORDER — LOSARTAN POTASSIUM-HCTZ 100-25 MG PO TABS: 1.0000 | ORAL_TABLET | Freq: Every day | ORAL | Status: DC

## 2023-10-07 MED ORDER — POLYETHYLENE GLYCOL 3350 17 G PO PACK: 17.0000 g | PACK | Freq: Two times a day (BID) | ORAL | Status: DC | PRN

## 2023-10-07 MED ORDER — LOSARTAN POTASSIUM 50 MG PO TABS
100.0000 mg | ORAL_TABLET | Freq: Every day | ORAL | Status: DC
  Administered 2023-10-07 – 2023-10-08 (×2): 100 mg via ORAL
  Filled 2023-10-07 (×2): qty 2

## 2023-10-07 MED ORDER — POLYETHYLENE GLYCOL 3350 17 G PO PACK
17.0000 g | PACK | Freq: Two times a day (BID) | ORAL | Status: DC
  Administered 2023-10-07 – 2023-10-09 (×5): 17 g via ORAL
  Filled 2023-10-07 (×5): qty 1

## 2023-10-07 MED ORDER — HYDROCHLOROTHIAZIDE 25 MG PO TABS
25.0000 mg | ORAL_TABLET | Freq: Every day | ORAL | Status: DC
  Administered 2023-10-07 – 2023-10-12 (×6): 25 mg via ORAL
  Filled 2023-10-07 (×6): qty 1

## 2023-10-07 NOTE — Assessment & Plan Note (Addendum)
Atrial fibrillation ruled out.  As part of workup for his previous TIA, he has had monitoring without signs of atrial fibrillation.  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 - Continue Lipitor - Hold Plavix

## 2023-10-07 NOTE — Progress Notes (Signed)
Progress Note   Patient: Jonathan Lane ZOX:[id number] DOB: 02-05-1941 DOA: 10/05/2023     1 DOS: the patient was seen and examined on 10/07/2023 at 9:55 AM      Brief hospital course: Jonathan Lane is an 82 y.o. 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     Assessment and Plan: * Cholangitis    Acute biliary pancreatitis Abdominal pain responsive to analgesics overnight. - Continue Zosyn - Consult GI, appreciate cares, plan for ERCP on 10/23 - Will consult general surgery tomorrow for consideration of cholecystectomy    OSA on CPAP - CPAP at night  Essential hypertension Blood pressure somewhat elevated - Resume losartan, HCTZ, diltiazem  Cerebrovascular disease Atrial fibrillation ruled out.  As part of workup for his previous TIA, he has had monitoring without signs of atrial fibrillation.  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 - Continue Lipitor - Hold Plavix          Subjective: Patient has some abdominal pain overnight that responded well to IV Dilaudid.  He said no fever, confusion, respiratory symptoms.  Nursing of no concerns.     Physical Exam: BP (!) 154/74 (BP Location: Right Arm)   Pulse 75   Temp 98.9 F (37.2 C)   Resp 16   Ht 6\' 1"  (1.854 m)   Wt 88.5 kg   SpO2 91%   BMI 25.73 kg/m   Adult male, sitting up in bed, interactive and appropriate, watching TV RRR, no murmurs, no peripheral edema Respiratory rate normal, lungs clear without rales or wheezes Abdomen soft, no guarding, no rebound or rigidity Attention normal, affect appropriate, judgment and insight appear normal, face symmetric, speech fluent    Data Reviewed: Basic metabolic panel shows sodium 134, creatinine potassium normal White blood cell count down to normal Total bilirubin slightly up to 5, AST and ALT  down to 202/301 MRCP shows choledocholithiasis Blood cultures no growth  Family Communication: None    Disposition: Status is: Inpatient         Author: Alberteen Sam, MD 10/07/2023 1:08 PM  For on call review [email or web address].

## 2023-10-07 NOTE — Assessment & Plan Note (Signed)
Blood pressure somewhat elevated - Resume losartan, HCTZ, diltiazem

## 2023-10-07 NOTE — Progress Notes (Signed)
Gastroenterology Inpatient Follow Up    Subjective: Patient states that he kind of overdid it with eating yesterday and subsequently developed abdominal pain.  He ate a lighter meal this morning and has not had any abdominal discomfort this morning.  He is doing well and has been ambulating in the hallway to continue his exercise regimen.  Denies any personal history of liver disease or family history of liver disease.  Denies any exposures to hepatitis  Objective: Vital signs in last 24 hours: Temp:  [98.4 F (36.9 C)-99.8 F (37.7 C)] 98.9 F (37.2 C) (10/20 0530) Pulse Rate:  [64-75] 75 (10/20 0530) Resp:  [16-18] 16 (10/20 0530) BP: (151-160)/(70-74) 154/74 (10/20 0530) SpO2:  [91 %-94 %] 91 % (10/20 0530) FiO2 (%):  [21 %] 21 % (10/19 2231) Last BM Date : 10/05/23  Intake/Output from previous day: 10/19 0701 - 10/20 0700 In: 303.3 [IV Piggyback:303.3] Out: -  Intake/Output this shift: Total I/O In: 240 [P.O.:240] Out: -   General appearance: alert and cooperative.  Jaundice and scleral icterus present Resp: no increased WOB Cardio: regular rate GI: non-tender, distended Extremities: no BLE edema  Lab Results: Recent Labs    10/05/23 1742 10/06/23 0317 10/07/23 0324  WBC 11.3* 8.1 7.3  HGB 14.0 12.4* 11.4*  HCT 41.8 38.7* 35.1*  PLT 184 151 144*   BMET Recent Labs    10/05/23 1742 10/06/23 0317 10/07/23 0324  NA 134* 134* 134*  K 3.6 3.4* 3.6  CL 96* 97* 101  CO2 28 28 25   GLUCOSE 153* 113* 115*  BUN 14 14 15   CREATININE 0.75 0.73 0.72  CALCIUM 9.7 9.0 8.3*   LFT Recent Labs    10/07/23 0324  PROT 6.1*  ALBUMIN 2.8*  AST 202*  ALT 301*  ALKPHOS 209*  BILITOT 5.0*   PT/INR Recent Labs    10/07/23 0324  LABPROT 15.5*  INR 1.2   Hepatitis Panel Recent Labs    10/07/23 0324  HEPBSAG NON REACTIVE  HCVAB NON REACTIVE  HEPAIGM NON REACTIVE  HEPBIGM NON REACTIVE   C-Diff No results for input(s): "CDIFFTOX" in the last 72  hours.  Studies/Results: MR ABDOMEN MRCP W WO CONTAST  Result Date: 10/06/2023 CLINICAL DATA:  82 year old male with history of bladder cancer presenting with signs and symptoms concerning for potential ascending cholangitis. EXAM: MRI ABDOMEN WITHOUT AND WITH CONTRAST (INCLUDING MRCP) TECHNIQUE: Multiplanar multisequence MR imaging of the abdomen was performed both before and after the administration of intravenous contrast. Heavily T2-weighted images of the biliary and pancreatic ducts were obtained, and three-dimensional MRCP images were rendered by post processing. CONTRAST:  10mL GADAVIST GADOBUTROL 1 MMOL/ML IV SOLN COMPARISON:  No prior abdominal MRI. CT of the abdomen and pelvis 10/05/2023. Abdominal ultrasound 10/05/2023. FINDINGS: Lower chest: Unremarkable. Hepatobiliary: No suspicious cystic or solid hepatic lesions. However, there is increased T2 signal intensity throughout the liver in a periportal distribution, which also demonstrates increased enhancement during early post gadolinium imaging, concerning for cholangitis. Filling defect lying dependently in the gallbladder measuring 1.4 cm in length compatible with a gallstone. Gallbladder is moderately distended. Gallbladder wall appears minimally thickened and edematous, although there is no frank pericholecystic fluid or overt surrounding inflammatory changes. MRCP images demonstrates some mild dilatation of the common bile duct which measures 9 mm. In the distal common bile duct there is at least 1 filling defect measuring up to 6 mm, indicative of choledocholithiasis. No intrahepatic biliary ductal dilatation noted at this time. Pancreas:  No pancreatic mass. No pancreatic ductal dilatation noted on MRCP images. No pancreatic or peripancreatic fluid collections or inflammatory changes. Spleen:  Unremarkable. Adrenals/Urinary Tract: 1.3 cm T1 hypointense, T2 hyperintense, nonenhancing lesion in the interpolar region of the right kidney is  compatible with a simple cyst (Bosniak class 1), no imaging follow-up recommended. Left kidney and bilateral adrenal glands are otherwise normal in appearance. No hydroureteronephrosis in the visualized portions of the abdomen. Stomach/Bowel: Visualized portions are unremarkable. Vascular/Lymphatic: Aortic atherosclerosis. No aneurysm identified in the visualized abdominal vasculature. No lymphadenopathy noted in the abdomen. Other: No significant volume of ascites noted in the visualized portions of the peritoneal cavity. Musculoskeletal: No aggressive appearing osseous lesions are noted in the visualized portions of the skeleton. IMPRESSION: 1. Today's study is positive for choledocholithiasis. This appears mildly obstructive resulting in mild dilatation of the common bile duct. No frank intrahepatic biliary ductal dilatation noted at this time. 2. Increased T2 signal intensity associated with the portal triads, which also demonstrate increased enhancement on early post gadolinium imaging; imaging findings compatible with reported clinical history of cholangitis. 3. Cholelithiasis. Gallbladder is moderately distended with mild gallbladder wall thickening and edema, but no frank pericholecystic fluid to clearly indicate an acute cholecystitis. 4. Additional incidental findings, as above. Electronically Signed   By: Trudie Reed M.D.   On: 10/06/2023 10:55   MR 3D Recon At Scanner  Result Date: 10/06/2023 CLINICAL DATA:  82 year old male with history of bladder cancer presenting with signs and symptoms concerning for potential ascending cholangitis. EXAM: MRI ABDOMEN WITHOUT AND WITH CONTRAST (INCLUDING MRCP) TECHNIQUE: Multiplanar multisequence MR imaging of the abdomen was performed both before and after the administration of intravenous contrast. Heavily T2-weighted images of the biliary and pancreatic ducts were obtained, and three-dimensional MRCP images were rendered by post processing. CONTRAST:   10mL GADAVIST GADOBUTROL 1 MMOL/ML IV SOLN COMPARISON:  No prior abdominal MRI. CT of the abdomen and pelvis 10/05/2023. Abdominal ultrasound 10/05/2023. FINDINGS: Lower chest: Unremarkable. Hepatobiliary: No suspicious cystic or solid hepatic lesions. However, there is increased T2 signal intensity throughout the liver in a periportal distribution, which also demonstrates increased enhancement during early post gadolinium imaging, concerning for cholangitis. Filling defect lying dependently in the gallbladder measuring 1.4 cm in length compatible with a gallstone. Gallbladder is moderately distended. Gallbladder wall appears minimally thickened and edematous, although there is no frank pericholecystic fluid or overt surrounding inflammatory changes. MRCP images demonstrates some mild dilatation of the common bile duct which measures 9 mm. In the distal common bile duct there is at least 1 filling defect measuring up to 6 mm, indicative of choledocholithiasis. No intrahepatic biliary ductal dilatation noted at this time. Pancreas: No pancreatic mass. No pancreatic ductal dilatation noted on MRCP images. No pancreatic or peripancreatic fluid collections or inflammatory changes. Spleen:  Unremarkable. Adrenals/Urinary Tract: 1.3 cm T1 hypointense, T2 hyperintense, nonenhancing lesion in the interpolar region of the right kidney is compatible with a simple cyst (Bosniak class 1), no imaging follow-up recommended. Left kidney and bilateral adrenal glands are otherwise normal in appearance. No hydroureteronephrosis in the visualized portions of the abdomen. Stomach/Bowel: Visualized portions are unremarkable. Vascular/Lymphatic: Aortic atherosclerosis. No aneurysm identified in the visualized abdominal vasculature. No lymphadenopathy noted in the abdomen. Other: No significant volume of ascites noted in the visualized portions of the peritoneal cavity. Musculoskeletal: No aggressive appearing osseous lesions are noted in  the visualized portions of the skeleton. IMPRESSION: 1. Today's study is positive for choledocholithiasis. This appears mildly obstructive resulting  in mild dilatation of the common bile duct. No frank intrahepatic biliary ductal dilatation noted at this time. 2. Increased T2 signal intensity associated with the portal triads, which also demonstrate increased enhancement on early post gadolinium imaging; imaging findings compatible with reported clinical history of cholangitis. 3. Cholelithiasis. Gallbladder is moderately distended with mild gallbladder wall thickening and edema, but no frank pericholecystic fluid to clearly indicate an acute cholecystitis. 4. Additional incidental findings, as above. Electronically Signed   By: Trudie Reed M.D.   On: 10/06/2023 10:55   US Abdomen Limited RUQ (LIVER/GB)  Result Date: 10/05/2023 CLINICAL DATA:  Abdominal pain EXAM: ULTRASOUND ABDOMEN LIMITED RIGHT UPPER QUADRANT COMPARISON:  CT from earlier in the same day. FINDINGS: Gallbladder: Cholelithiasis is again identified. No wall thickening or pericholecystic fluid is noted. Negative sonographic Murphy's sign is noted. Common bile duct: Diameter: 3 mm Liver: No focal lesion identified. Within normal limits in parenchymal echogenicity. Portal vein is patent on color Doppler imaging with normal direction of blood flow towards the liver. Other: None. IMPRESSION: Cholelithiasis without complicating factors. Electronically Signed   By: Alcide Clever M.D.   On: 10/05/2023 21:31   CT ABDOMEN PELVIS W CONTRAST  Result Date: 10/05/2023 CLINICAL DATA:  Right lower quadrant pain.  Fever. EXAM: CT ABDOMEN AND PELVIS WITH CONTRAST TECHNIQUE: Multidetector CT imaging of the abdomen and pelvis was performed using the standard protocol following bolus administration of intravenous contrast. RADIATION DOSE REDUCTION: This exam was performed according to the departmental dose-optimization program which includes automated  exposure control, adjustment of the mA and/or kV according to patient size and/or use of iterative reconstruction technique. CONTRAST:  OMNIPAQUE IOHEXOL 300 MG/ML  SOLN COMPARISON:  02/19/2023 FINDINGS: Lower Chest: No acute findings. Hepatobiliary: No suspicious hepatic masses identified. Gallstones are seen, however there is no evidence of cholecystitis or biliary dilatation. Pancreas:  No mass or inflammatory changes. Spleen: Within normal limits in size and appearance. Adrenals/Urinary Tract: No suspicious masses identified. 4 mm calculus again seen in lower pole of left kidney. No evidence of ureteral calculi or hydronephrosis. Unremarkable unopacified urinary bladder. Stomach/Bowel: No evidence of obstruction, inflammatory process or abnormal fluid collections. Diverticulosis is seen mainly involving the sigmoid colon, however there is no evidence of diverticulitis. Although the appendix is not directly visualized, no inflammatory process seen in region of the cecum or elsewhere. Vascular/Lymphatic: Stable shotty sub-centimeter retroperitoneal lymph nodes. No pathologically enlarged lymph nodes. No acute vascular findings. Reproductive:  Stable mildly enlarged prostate. Other:  None. Musculoskeletal:  No suspicious bone lesions identified. IMPRESSION: No evidence of appendicitis or other acute findings. Cholelithiasis. No radiographic evidence of cholecystitis. Colonic diverticulosis, without radiographic evidence of diverticulitis. Stable mildly enlarged prostate. Small left renal calculus. No evidence of ureteral calculi or hydronephrosis. Electronically Signed   By: Danae Orleans M.D.   On: 10/05/2023 20:13    Medications: I have reviewed the patient's current medications. Scheduled:  atorvastatin  20 mg Oral QHS   diltiazem  240 mg Oral Daily   Dorzolamide HCl-Timolol Mal PF  1 drop Both Eyes BID   enoxaparin (LOVENOX) injection  40 mg Subcutaneous Q24H   losartan  100 mg Oral Daily   And    hydrochlorothiazide  25 mg Oral Daily   melatonin  6 mg Oral QHS   Netarsudil Dimesylate  1 drop Both Eyes QHS   polyethylene glycol  17 g Oral BID   tadalafil  5 mg Oral Daily   tamsulosin  0.4 mg Oral Daily  Continuous:  piperacillin-tazobactam 3.375 g (10/07/23 0520)   ZOX:[id number] **OR** acetaminophen, HYDROmorphone (DILAUDID) injection, melatonin, nitroGLYCERIN, ondansetron **OR** ondansetron (ZOFRAN) IV, white petrolatum  Assessment/Plan: 82 y.o. 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  Patient's abdominal pain has improved significantly with antibiotic therapy and supportive care.  Patient had a slight exacerbation of his abdominal pain yesterday evening after food consumption.  He ate a lighter meal this morning and was able to tolerate it without issues.  Patient has not had a stool for several days so we will start him on some scheduled MiraLAX to aid with regular bowel movements.  His LFTs do remain elevated but patient remains hemodynamically stable.  Suspect his elevated LFTs are due to his cholangitis, which is being controlled with antibiotic therapy at this time.  Acute hepatitis panel was negative.  We are planning for treatment of his cholangitis with an ERCP on 10/23 to allow for Plavix washout.  -Trend LFTs -Continue IV antibiotics -Continue supportive care -Continue low-fat diet -Plan for surgery consult for consideration of cholecystectomy closer to the time of his ERCP -Plan for ERCP on 10/23 to allow for Plavix washout.  Last dose of Plavix was on 10/18.   LOS: 1 day   Imogene Burn 10/07/2023, 12:18 PM

## 2023-10-07 NOTE — Assessment & Plan Note (Signed)
Abdominal pain responsive to analgesics overnight. - Continue Zosyn - Consult GI, appreciate cares, plan for ERCP on 10/23 - Will consult general surgery tomorrow for consideration of cholecystectomy

## 2023-10-07 NOTE — Progress Notes (Signed)
   10/07/23 2329  BiPAP/CPAP/SIPAP  Reason BIPAP/CPAP not in use Other(comment) (patient stated he did not want to wear tonight)

## 2023-10-07 NOTE — Plan of Care (Signed)

## 2023-10-07 NOTE — Assessment & Plan Note (Signed)
-   CPAP at night °

## 2023-10-07 NOTE — Hospital Course (Signed)
Jonathan Lane is an 82 y.o. 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  GI and Gen surgery consulted.  Plan for ERCP then Lap Chole.

## 2023-10-08 ENCOUNTER — Encounter (HOSPITAL_COMMUNITY): Payer: Self-pay | Admitting: Anesthesiology

## 2023-10-08 ENCOUNTER — Encounter (HOSPITAL_COMMUNITY): Payer: Self-pay | Admitting: General Surgery

## 2023-10-08 DIAGNOSIS — K851 Biliary acute pancreatitis without necrosis or infection: Secondary | ICD-10-CM | POA: Diagnosis not present

## 2023-10-08 DIAGNOSIS — K8309 Other cholangitis: Secondary | ICD-10-CM | POA: Diagnosis not present

## 2023-10-08 DIAGNOSIS — I10 Essential (primary) hypertension: Secondary | ICD-10-CM | POA: Diagnosis not present

## 2023-10-08 DIAGNOSIS — I679 Cerebrovascular disease, unspecified: Secondary | ICD-10-CM | POA: Diagnosis not present

## 2023-10-08 LAB — CBC
HCT: 35.2 % — ABNORMAL LOW (ref 39.0–52.0)
Hemoglobin: 11.4 g/dL — ABNORMAL LOW (ref 13.0–17.0)
MCH: 30.5 pg (ref 26.0–34.0)
MCHC: 32.4 g/dL (ref 30.0–36.0)
MCV: 94.1 fL (ref 80.0–100.0)
Platelets: 154 10*3/uL (ref 150–400)
RBC: 3.74 MIL/uL — ABNORMAL LOW (ref 4.22–5.81)
RDW: 14.6 % (ref 11.5–15.5)
WBC: 5.9 10*3/uL (ref 4.0–10.5)
nRBC: 0 % (ref 0.0–0.2)

## 2023-10-08 LAB — COMPREHENSIVE METABOLIC PANEL
ALT: 242 U/L — ABNORMAL HIGH (ref 0–44)
AST: 127 U/L — ABNORMAL HIGH (ref 15–41)
Albumin: 3 g/dL — ABNORMAL LOW (ref 3.5–5.0)
Alkaline Phosphatase: 206 U/L — ABNORMAL HIGH (ref 38–126)
Anion gap: 10 (ref 5–15)
BUN: 13 mg/dL (ref 8–23)
CO2: 26 mmol/L (ref 22–32)
Calcium: 8.3 mg/dL — ABNORMAL LOW (ref 8.9–10.3)
Chloride: 98 mmol/L (ref 98–111)
Creatinine, Ser: 0.85 mg/dL (ref 0.61–1.24)
GFR, Estimated: >60 mL/min (ref >60)
Glucose, Bld: 112 mg/dL — ABNORMAL HIGH (ref 70–99)
Potassium: 3.2 mmol/L — ABNORMAL LOW (ref 3.5–5.1)
Sodium: 134 mmol/L — ABNORMAL LOW (ref 135–145)
Total Bilirubin: 2.4 mg/dL — ABNORMAL HIGH (ref 0.3–1.2)
Total Protein: 6.3 g/dL — ABNORMAL LOW (ref 6.5–8.1)

## 2023-10-08 MED ORDER — SENNOSIDES-DOCUSATE SODIUM 8.6-50 MG PO TABS
1.0000 | ORAL_TABLET | Freq: Every evening | ORAL | Status: DC | PRN
  Filled 2023-10-08: qty 1

## 2023-10-08 MED ORDER — BISACODYL 5 MG PO TBEC: 10.0000 mg | DELAYED_RELEASE_TABLET | Freq: Once | ORAL | Status: DC

## 2023-10-08 MED ORDER — BISACODYL 10 MG RE SUPP
10.0000 mg | Freq: Every day | RECTAL | Status: AC | PRN
Start: 2023-10-08 — End: 2023-10-08
  Administered 2023-10-08: 10 mg via RECTAL
  Filled 2023-10-08: qty 1

## 2023-10-08 MED ORDER — POTASSIUM CHLORIDE CRYS ER 20 MEQ PO TBCR
40.0000 meq | EXTENDED_RELEASE_TABLET | Freq: Once | ORAL | Status: AC
  Administered 2023-10-08: 40 meq via ORAL
  Filled 2023-10-08: qty 2

## 2023-10-08 NOTE — Assessment & Plan Note (Signed)
Resolved with supplementation and starting spironolactone. 

## 2023-10-08 NOTE — Anesthesia Preprocedure Evaluation (Deleted)
Anesthesia Evaluation  Patient identified by MRN, date of birth, ID band Patient awake    Reviewed: Allergy & Precautions, NPO status , Patient's Chart, lab work & pertinent test results  History of Anesthesia Complications Negative for: history of anesthetic complications  Airway Mallampati: II  TM Distance: >3 FB Neck ROM: Full    Dental no notable dental hx.    Pulmonary sleep apnea and Continuous Positive Airway Pressure Ventilation , former smoker   Pulmonary exam normal        Cardiovascular hypertension, Pt. on medications Normal cardiovascular exam+ dysrhythmias (on Eliquis) Atrial Fibrillation      Neuro/Psych TIA   GI/Hepatic ,GERD  Medicated,,GALLSTONE PANCREATITIS, ALT 242, AST 127   Endo/Other  negative endocrine ROS    Renal/GU negative Renal ROS  negative genitourinary   Musculoskeletal negative musculoskeletal ROS (+)    Abdominal   Peds  Hematology  (+) Blood dyscrasia (Hgb 11.4), anemia   Anesthesia Other Findings Day of surgery medications reviewed with patient.  Reproductive/Obstetrics negative OB ROS                              Anesthesia Physical Anesthesia Plan  ASA: 3  Anesthesia Plan: General   Post-op Pain Management:    Induction: Intravenous  PONV Risk Score and Plan: 2 and Ondansetron, Dexamethasone and Treatment may vary due to age or medical condition  Airway Management Planned: Oral ETT  Additional Equipment: None  Intra-op Plan:   Post-operative Plan: Extubation in OR  Informed Consent: I have reviewed the patients History and Physical, chart, labs and discussed the procedure including the risks, benefits and alternatives for the proposed anesthesia with the patient or authorized representative who has indicated his/her understanding and acceptance.     Dental advisory given  Plan Discussed with: CRNA  Anesthesia Plan Comments:           Anesthesia Quick Evaluation

## 2023-10-08 NOTE — Progress Notes (Signed)
Progress Note   Patient: Jonathan Lane ZOX:[id number] DOB: March 01, 1941 DOA: 10/05/2023     2 DOS: the patient was seen and examined on 10/08/2023 at 1:45 PM      Brief hospital course: Jonathan Lane is an 82 y.o. 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     Assessment and Plan: * Cholangitis  Acute biliary pancreatitis Symptoms managed.  LFTs improving. - Continue IV antibiotics - Consult GI and general surgery, appreciate expertise regarding ERCP vs lap chole with IOC     Hypokalemia - Supp K  OSA on CPAP - CPAP at night  Essential hypertension Blood pressure somewhat elevated - Continue HCTZ, diltiazem - Hold losartan periop  Cerebrovascular disease - Continue Lipitor - Hold Plavix          Subjective: Patient remains very uncomfortably constipated.  He said no fever, no confusion.  He still jaundiced.     Physical Exam: BP (!) 172/85 (BP Location: Right Arm)   Pulse (!) 58   Temp 98.3 F (36.8 C)   Resp 16   Ht 6\' 1"  (1.854 m)   Wt 88.5 kg   SpO2 97%   BMI 25.73 kg/m   Adult male, sitting up in recliner, interactive and appropriate RRR, no murmurs, no peripheral edema Respiratory rate normal, lungs clear without rales or wheezes Abdomen slightly distended, diffuse tenderness without guarding or rebound Attention normal, affect normal, judgment and insight appear normal, face symmetric, speech fluent    Data Reviewed: Potassium 3.2, sodium 134, no change LFTs and total bilirubin trending down Hepatitis serologies negative White blood cell count normal, ammonia normal    Family Communication: Girlfriend at the bedside    Disposition: Status is: Inpatient         Author: Alberteen Sam, MD 10/08/2023 5:09 PM  For on call review [email or web address].

## 2023-10-08 NOTE — Progress Notes (Signed)
   10/08/23 2225  BiPAP/CPAP/SIPAP  BiPAP/CPAP/SIPAP Pt Type Adult  BiPAP/CPAP/SIPAP DREAMSTATIOND  BiPAP/CPAP /SiPAP Vitals  Temp 98.6 F (37 C)  Pulse Rate 61  Resp 15  BP 129/60  SpO2 94 %  MEWS Score/Color  MEWS Score 0  MEWS Score Color Green   Pt. Stated he did not want to wear this evening, made aware to notify if help or adjustments needed.

## 2023-10-08 NOTE — Progress Notes (Signed)
   10/08/23 0916  TOC Brief Assessment  Insurance and Status Reviewed  Patient has primary care physician Yes  Home environment has been reviewed home alone  Prior level of function: independent  Prior/Current Home Services No current home services  Social Determinants of Health Reivew SDOH reviewed no interventions necessary  Readmission risk has been reviewed Yes  Transition of care needs no transition of care needs at this time

## 2023-10-08 NOTE — Progress Notes (Addendum)
Patient ID: Jonathan Lane, male   DOB: 1941-03-11, 82 y.o.   MRN: [id number]    Progress Note   Subjective   Day # 3 CC; acute abdominal pain nausea vomiting fever-gallstone pancreatitis/cholangitis  IV Zosyn Plavix on hold-last dose 10/18  Labs today-WBC 5.9/hemoglobin 11.4/hematocrit 35.2 Sodium 134/potassium 3.2/BUN 13/creatinine 0.85 T. bili 2.4/alk phos 206/AST 127/ALT 242-all improved  Patient says he feels 1000% better than he did on admission, is not taking any pain medication, able to tolerate solid food, he does have some soreness in the right upper quadrant today but is trying not to take pain medication, denies any nausea.  He feels somewhat bloated/distended, has not had a bowel movement since admission, started twice daily MiraLAX yesterday     Objective   Vital signs in last 24 hours: Temp:  [98.1 F (36.7 C)-98.3 F (36.8 C)] 98.1 F (36.7 C) (10/21 0501) Pulse Rate:  [54-61] 59 (10/21 0501) Resp:  [16] 16 (10/21 0501) BP: (128-145)/(55-75) 145/60 (10/21 0501) SpO2:  [93 %-96 %] 93 % (10/21 0501) Last BM Date : 10/05/23 General:    Elderly white male in NAD Heart:  Regular rate and rhythm; no murmurs Lungs: Respirations even and unlabored, lungs CTA bilaterally Abdomen:  Soft, protuberant, bowel sounds are present, very some mild tenderness in the epigastrium/right upper quadrant no guarding or rebound Extremities:  Without edema. Neurologic:  Alert and oriented,  grossly normal neurologically. Psych:  Cooperative. Normal mood and affect.  Intake/Output from previous day: 10/20 0701 - 10/21 0700 In: 941.3 [P.O.:840; IV Piggyback:101.3] Out: -  Intake/Output this shift: No intake/output data recorded.  Lab Results: Recent Labs    10/06/23 0317 10/07/23 0324 10/08/23 0319  WBC 8.1 7.3 5.9  HGB 12.4* 11.4* 11.4*  HCT 38.7* 35.1* 35.2*  PLT 151 144* 154   BMET Recent Labs    10/06/23 0317 10/07/23 0324 10/08/23 0319  NA 134* 134* 134*  K  3.4* 3.6 3.2*  CL 97* 101 98  CO2 28 25 26   GLUCOSE 113* 115* 112*  BUN 14 15 13   CREATININE 0.73 0.72 0.85  CALCIUM 9.0 8.3* 8.3*   LFT Recent Labs    10/08/23 0319  PROT 6.3*  ALBUMIN 3.0*  AST 127*  ALT 242*  ALKPHOS 206*  BILITOT 2.4*   PT/INR Recent Labs    10/07/23 0324  LABPROT 15.5*  INR 1.2        Assessment / Plan:    #70 82 year old white male with acute gallstone pancreatitis, cholangitis. Clinically significantly improved since admission  remains on IV Zosyn LFTs continue to improve Awaiting Plavix washout  #2 history of atrial fibrillation #3 obstructive sleep apnea #4 history of TIA-on Plavix #5 history of ureteral cancer status post surgical resection  #6 hypokalemia-needs corrected #7 constipation secondary to above-continue twice daily MiraLAX, have added Dulcolax suppository daily as needed  Plan; sinew low-fat diet .  Continue IV Zosyn Patient is scheduled for ERCP with Dr. Chales Abrahams on Wednesday, 10/10/2023 with stone extraction.  I reviewed the procedure today in detail, we reviewed biliary images, and discussed indications risks and benefits in detail.  Surgery to see patient today regarding laparoscopic cholecystectomy He also mentions that he is scheduled for a cystoscopy with urology on 11/02/2023     Principal Problem:   Cholangitis Active Problems:   Cerebrovascular disease   Essential hypertension   OSA on CPAP   Hypokalemia   Acute biliary pancreatitis     LOS: 2 days  Amy Esterwood PA-C 10/08/2023, 10:37 AM     Attending physician's note   I have taken history, reviewed the chart and examined the patient. I performed a substantive portion of this encounter, including complete performance of at least one of the key components, in conjunction with the APP. I agree with the Advanced Practitioner's note, impression and recommendations.   Responded well to IV Zosyn Improving LFTs.  Plan for ERCP 10/23 after Plavix  washout Explained risks and benefits Trend LFTs   Edman Circle, MD [facility] [phone]

## 2023-10-08 NOTE — Consult Note (Signed)
Jonathan Lane September 02, 1941  [id number].    Requesting MD: Dr. Joen Laura Chief Complaint/Reason for Consult: gallstone pancreatitis  HPI:  This is an 82 yo white male with a history of OSA, GERD, HLD, HTN, bladder cancer, a fib, history of CVA on plavix who presented to the ED on 10/06/23 with significant epigastric abdominal pain since last Sunday.  He has some N/V.  He describes the pain as like a hot poker in his RUQ and epigastrium.  He went to see his PCP on Friday and he was noted to be running a fever and was referred to the ED.  Upon arrival, he was noted to have gallstone pancreatitis.  He was admitted and his Plavix has been held.  He was thought to have some cholangitis, but his LFTs are starting to downtrend today and he is feeling well with no further pain or fevers.  He is currently scheduled for ERCP on Wednesday after a plavix washout.  We have been asked to see him for further surgical recommendations.  ROS: ROS: see HPI.  He states he has been having some occasional post-prandial RUQ for years.  Family History  Problem Relation Age of Onset   Stroke Father    Stroke Mother    Stroke Sister    Breast cancer Sister    High blood pressure Sister    Diabetes Sister    Colon cancer Other        Uncle   Colon cancer Paternal Uncle    Mitral valve prolapse Other        sugery 09-27-2017   Rectal cancer Neg Hx    Stomach cancer Neg Hx     Past Medical History:  Diagnosis Date   AAA (abdominal aortic aneurysm) (HCC)    Atrial fibrillation (HCC)    Bradycardia    Bradycardia    Cancer (HCC)    Cardiac murmur    Dizziness    Dysrhythmia    GERD (gastroesophageal reflux disease)    Glaucoma    Heart palpitations    Hyperlipidemia    Hypertension    Numbness 08/26/2019   LEFT FACE   Pneumonia    Skin abnormalities    pre cancerous lesion R hand   Sleep apnea    uses cpap   Snoring     Past Surgical History:  Procedure Laterality Date    APPENDECTOMY  12/19/1963   COLONOSCOPY     CYSTOSCOPY WITH BIOPSY N/A 05/16/2022   Procedure: CYSTOSCOPY WITH BIOPSY;  Surgeon: Bjorn Pippin, MD;  Location: [facility];  Service: Urology;  Laterality: N/A;   CYSTOSCOPY WITH RETROGRADE PYELOGRAM, URETEROSCOPY AND STENT PLACEMENT Bilateral 05/16/2022   Procedure: CYSTOSCOPY WITH BILATERAL RETROGRADE PYELOGRAM, LEFT URETEROSCOPY WITH BIOPSY, HOLMIUM LASER OF LEFT URETERAL STONE  AND LEFT STENT EXCHANGE, TUR LEFT URETERAL ORIFACE;  Surgeon: Bjorn Pippin, MD;  Location: [facility];  Service: Urology;  Laterality: Bilateral;  60 MINUTES   CYSTOSCOPY WITH RETROGRADE PYELOGRAM, URETEROSCOPY AND STENT PLACEMENT Left 07/26/2022   Procedure: CYSTOSCOPY WITH RETROGRADE PYELOGRAM, URETEROSCOPY AND STENT EXCHANGE;  Surgeon: Sebastian Ache, MD;  Location: [facility];  Service: Urology;  Laterality: Left;   LYMPH NODE DISSECTION Bilateral 07/26/2022   Procedure: LYMPH NODE DISSECTION;  Surgeon: Sebastian Ache, MD;  Location: [facility];  Service: Urology;  Laterality: Bilateral;   POLYPECTOMY     SKIN CANCER EXCISION     TONSILLECTOMY     as a child x2   TRANSURETHRAL RESECTION OF  BLADDER TUMOR Bilateral 01/31/2022   Procedure: RESTAGING TRANSURETHRAL RESECTION OF BLADDER TUMOR  (TURBT) LEFT URETEROSCOPY WITH LASER LEFT STENT EXCHANGE RIGHT STENT REMOVAL;  Surgeon: Bjorn Pippin, MD;  Location: [facility];  Service: Urology;  Laterality: Bilateral;   TRANSURETHRAL RESECTION OF BLADDER TUMOR WITH MITOMYCIN-C Bilateral 01/03/2022   Procedure: CYSTOSCOPY TRANSURETHRAL RESECTION OF BLADDER TUMOR WITH POSSIBLE  BILATERAL RETROGRADES STENT PLACEMENT;  Surgeon: Bjorn Pippin, MD;  Location: [facility];  Service: Urology;  Laterality: Bilateral;   WISDOM TOOTH EXTRACTION      Social History:  reports that he quit smoking about 57 years ago. His smoking use included cigarettes. He has never been exposed to tobacco smoke. He has never used smokeless tobacco. He reports that he does not currently use  alcohol. He reports that he does not use drugs.  Allergies:  Allergies  Allergen Reactions   Brimonidine Tartrate-Timolol Itching, Rash and Other (See Comments)    Itching eyes Other reaction(s): Not available   Brimonidine Tartrate Other (See Comments)    Irritates the eyes and increased pressure   Tetracycline Hcl Other (See Comments)    Doesn't remember reaction-"like 55 years ago"    Ciprofloxacin Other (See Comments)    Constipation    Medications Prior to Admission  Medication Sig Dispense Refill   acetaminophen (TYLENOL) 500 MG tablet Take 500 mg by mouth at bedtime.     Apoaequorin (PREVAGEN EXTRA STRENGTH) 20 MG CAPS Take 1 capsule by mouth daily.     atorvastatin (LIPITOR) 20 MG tablet Take 1 tablet (20 mg total) by mouth daily. (Patient taking differently: Take 20 mg by mouth at bedtime.) 30 tablet 0   Cholecalciferol 25 MCG (1000 UT) capsule Take 1,000 Units by mouth daily.     clopidogrel (PLAVIX) 75 MG tablet Take 1 tablet by mouth daily.     Coenzyme Q-10 200 MG CAPS Take 1 capsule by mouth daily.     COSOPT PF 2-0.5 % SOLN ophthalmic solution Place 1 drop into both eyes in the morning and at bedtime.     CRANBERRY PO Take 1 tablet by mouth daily.     cyanocobalamin (VITAMIN B12) 1000 MCG tablet Take 500 mcg by mouth daily.     diltiazem (DILT-XR) 240 MG 24 hr capsule Take 1 capsule (240 mg total) by mouth daily. Please call our office to schedule an yearly appointment with Dr. Eden Emms for December 2024 before anymore refills. [phone]. Thank you 1st attempt 90 capsule 1   docusate sodium (COLACE) 100 MG capsule Take 1 capsule (100 mg total) by mouth 2 (two) times daily.     esomeprazole (NEXIUM) 20 MG capsule Take 20 mg by mouth 2 (two) times daily before a meal.     fluticasone (FLONASE) 50 MCG/ACT nasal spray Place 2 sprays into both nostrils daily as needed for allergies or rhinitis.     losartan-hydrochlorothiazide (HYZAAR) 100-25 MG tablet Take 1 tablet by  mouth daily. 90 tablet 1   Melatonin 3 MG CAPS Take 2 capsules by mouth at bedtime. 6 mg nightly     Misc Natural Products (PROSTATE HEALTH) CAPS Take 1 capsule by mouth daily.     Multiple Vitamins-Minerals (ADULT GUMMY PO) Take 2 capsules by mouth daily.     Multiple Vitamins-Minerals (PRESERVISION AREDS 2+MULTI VIT) CAPS Take 1 capsule by mouth 2 times daily at 12 noon and 4 pm.     nitroGLYCERIN (NITROSTAT) 0.4 MG SL tablet Place 1 tablet (0.4 mg total) under the tongue every  5 (five) minutes as needed for chest pain. 10 tablet 1   NON FORMULARY Pt uses a cpap nightly     Omega-3 Fatty Acids (FISH OIL) 1200 MG CAPS Take 1 capsule by mouth 2 (two) times daily.     OVER THE COUNTER MEDICATION Take 1 tablet by mouth daily. Focus Factor     OVER THE COUNTER MEDICATION Take 1 capsule by mouth daily. Saffron 28 mg     Probiotic Product (ALIGN PO) Take 1 capsule by mouth in the morning.     RHOPRESSA 0.02 % SOLN Place 1 drop into both eyes at bedtime.      tadalafil (CIALIS) 5 MG tablet Take 5 mg by mouth daily.     tamsulosin (FLOMAX) 0.4 MG CAPS capsule Take 1 capsule (0.4 mg total) by mouth daily. 30 capsule 0   valACYclovir (VALTREX) 1000 MG tablet Take 1,000 mg by mouth 2 (two) times daily as needed (for outbreaks).     Zinc 25 MG TABS Take 1 tablet by mouth daily.     apixaban (ELIQUIS) 5 MG TABS tablet TAKE ONE TABLET BY MOUTH TWICE DAILY START WHEN FINISHED STARTER PACK (Patient not taking: Reported on 10/06/2023) 60 tablet 5     Physical Exam: Blood pressure (!) 145/60, pulse (!) 59, temperature 98.1 F (36.7 C), temperature source Oral, resp. rate 16, height 6\' 1"  (1.854 m), weight 88.5 kg, SpO2 93%. General: pleasant, WD, WN white male sitting up in his chair in NAD HEENT: head is normocephalic, atraumatic.  Sclera are noninjected.  PERRL.  Ears and nose without any masses or lesions.  Mouth is pink and moist Heart: regular, rate, and rhythm.  Normal s1,s2. No obvious murmurs,  gallops, or rubs noted.  Lungs: CTAB, no wheezes, rhonchi, or rales noted.  Respiratory effort nonlabored Abd: soft, NT, ND, +BS, no masses, hernias, or organomegaly Psych: A&Ox3 with an appropriate affect.   Results for orders placed or performed during the hospital encounter of 10/05/23 (from the past 48 hour(s))  CBC     Status: Abnormal   Collection Time: 10/07/23  3:24 AM  Result Value Ref Range   WBC 7.3 4.0 - 10.5 K/uL   RBC 3.73 (L) 4.22 - 5.81 MIL/uL   Hemoglobin 11.4 (L) 13.0 - 17.0 g/dL   HCT 95.6 (L) 21.3 - 08.6 %   MCV 94.1 80.0 - 100.0 fL   MCH 30.6 26.0 - 34.0 pg   MCHC 32.5 30.0 - 36.0 g/dL   RDW 57.8 46.9 - 62.9 %   Platelets 144 (L) 150 - 400 K/uL   nRBC 0.0 0.0 - 0.2 %    Comment: Performed at [facility], [address], [place], [place] [address]  Comprehensive metabolic panel     Status: Abnormal   Collection Time: 10/07/23  3:24 AM  Result Value Ref Range   Sodium 134 (L) 135 - 145 mmol/L   Potassium 3.6 3.5 - 5.1 mmol/L   Chloride 101 98 - 111 mmol/L   CO2 25 22 - 32 mmol/L   Glucose, Bld 115 (H) 70 - 99 mg/dL    Comment: Glucose reference range applies only to samples taken after fasting for at least 8 hours.   BUN 15 8 - 23 mg/dL   Creatinine, Ser 3.24 0.61 - 1.24 mg/dL   Calcium 8.3 (L) 8.9 - 10.3 mg/dL   Total Protein 6.1 (L) 6.5 - 8.1 g/dL   Albumin 2.8 (L) 3.5 - 5.0 g/dL  AST 202 (H) 15 - 41 U/L   ALT 301 (H) 0 - 44 U/L   Alkaline Phosphatase 209 (H) 38 - 126 U/L   Total Bilirubin 5.0 (H) 0.3 - 1.2 mg/dL   GFR, Estimated >60 >45 mL/min    Comment: (NOTE) Calculated using the CKD-EPI Creatinine Equation (2021)    Anion gap 8 5 - 15    Comment: Performed at [facility], [address], [place], [place] [address]  Lipase, blood     Status: Abnormal   Collection Time: 10/07/23  3:24 AM  Result Value Ref Range   Lipase 79 (H) 11 - 51 U/L    Comment: Performed at [facility], [address], [place], [place] [address]  Hepatitis panel, acute     Status: None   Collection Time: 10/07/23  3:24 AM  Result Value Ref Range   Hepatitis B Surface Ag NON REACTIVE NON REACTIVE   HCV Ab NON REACTIVE NON REACTIVE    Comment: (NOTE) Nonreactive HCV antibody screen is consistent with no HCV infections,  unless recent infection is suspected or other evidence exists to indicate HCV infection.     Hep A IgM NON REACTIVE NON REACTIVE   Hep B C IgM NON REACTIVE NON REACTIVE    Comment: Performed at [facility], [address], [place], [place] [address]  Protime-INR     Status: Abnormal   Collection Time: 10/07/23  3:24 AM  Result Value Ref Range   Prothrombin Time 15.5 (H) 11.4 - 15.2 seconds   INR 1.2 0.8 - 1.2    Comment: (NOTE) INR goal varies based on device and disease states. Performed at [facility], [address] [place], [place] [address]   CBC     Status: Abnormal   Collection Time: 10/08/23  3:19 AM  Result Value Ref Range   WBC 5.9 4.0 - 10.5 K/uL   RBC 3.74 (L) 4.22 - 5.81 MIL/uL   Hemoglobin 11.4 (L) 13.0 - 17.0 g/dL   HCT 65.7 (L) 84.6 - 96.2 %   MCV 94.1 80.0 - 100.0 fL   MCH 30.5 26.0 - 34.0 pg   MCHC 32.4 30.0 - 36.0 g/dL   RDW 95.2 84.1 - 32.4 %   Platelets 154 150 - 400 K/uL   nRBC 0.0 0.0 - 0.2 %    Comment: Performed at [facility], [address], [place], [place] [address]  Comprehensive metabolic panel     Status: Abnormal   Collection Time: 10/08/23  3:19 AM  Result Value Ref Range   Sodium 134 (L) 135 - 145 mmol/L   Potassium 3.2 (L) 3.5 - 5.1 mmol/L   Chloride 98 98 - 111 mmol/L   CO2 26 22 - 32 mmol/L   Glucose, Bld 112 (H) 70 - 99 mg/dL    Comment: Glucose reference range applies only to samples taken after fasting for at least 8 hours.   BUN 13 8 - 23 mg/dL   Creatinine, Ser 7.25 0.61 - 1.24 mg/dL   Calcium 8.3 (L) 8.9 - 10.3 mg/dL   Total Protein 6.3 (L) 6.5 - 8.1 g/dL   Albumin  3.0 (L) 3.5 - 5.0 g/dL   AST 366 (H) 15 - 41 U/L   ALT 242 (H) 0 - 44 U/L   Alkaline Phosphatase 206 (H) 38 - 126 U/L   Total Bilirubin 2.4 (H) 0.3 - 1.2 mg/dL   GFR, Estimated >44 >  60 mL/min    Comment: (NOTE) Calculated using the CKD-EPI Creatinine Equation (2021)    Anion gap 10 5 - 15    Comment: Performed at [facility], [address] [place], [place] [address]   No results found.    Assessment/Plan Gallstone pancreatitis, possible choledocholithiasis The patient has been seen, examined, labs, vitals, imaging, and chart reviewed.  He appears to have gallstone pancreatitis that is resolving.  He did get admitted with elevated LFTs to suggest and an MRCP with choledocholithiasis.  As he has been here, his LFTs have started to downtrend.  He may still have choledocholithiasis, but it is possible these have passed as well. The patient looks great today with resolution of all of his symptoms, AF, and normal WBC.  We will recheck his labs in the am, but likely plan to proceed with lap chole with IOC tomorrow.  This was discussed with the patient who is agreeable.  I have also discussed with the primary service and GI service.   FEN - HH diet, NPO p MN VTE - plavix on hold ID - none currently  H/o CVA - on plavix, on hold OSA Bladder cancer CAD A fib HLD HTN  I reviewed Consultant GI notes, hospitalist notes, last 24 h vitals and pain scores, last 48 h intake and output, last 24 h labs and trends, and last 24 h imaging results.  Letha Cape, [facility] 10/08/2023, 12:07 PM Please see Amion for pager number during day hours 7:00am-4:30pm or 7:00am -11:30am on weekends

## 2023-10-09 ENCOUNTER — Encounter (HOSPITAL_COMMUNITY)
Admission: EM | Disposition: A | Discharge status: Home Health | Payer: Self-pay | Source: Ambulatory Visit | Attending: Family Medicine

## 2023-10-09 DIAGNOSIS — K851 Biliary acute pancreatitis without necrosis or infection: Secondary | ICD-10-CM | POA: Diagnosis not present

## 2023-10-09 DIAGNOSIS — I10 Essential (primary) hypertension: Secondary | ICD-10-CM | POA: Diagnosis not present

## 2023-10-09 DIAGNOSIS — K8309 Other cholangitis: Secondary | ICD-10-CM | POA: Diagnosis not present

## 2023-10-09 DIAGNOSIS — E876 Hypokalemia: Secondary | ICD-10-CM

## 2023-10-09 DIAGNOSIS — I679 Cerebrovascular disease, unspecified: Secondary | ICD-10-CM | POA: Diagnosis not present

## 2023-10-09 LAB — COMPREHENSIVE METABOLIC PANEL
ALT: 318 U/L — ABNORMAL HIGH (ref 0–44)
AST: 275 U/L — ABNORMAL HIGH (ref 15–41)
Albumin: 2.9 g/dL — ABNORMAL LOW (ref 3.5–5.0)
Alkaline Phosphatase: 232 U/L — ABNORMAL HIGH (ref 38–126)
Anion gap: 10 (ref 5–15)
BUN: 9 mg/dL (ref 8–23)
CO2: 25 mmol/L (ref 22–32)
Calcium: 8.8 mg/dL — ABNORMAL LOW (ref 8.9–10.3)
Chloride: 101 mmol/L (ref 98–111)
Creatinine, Ser: 0.64 mg/dL (ref 0.61–1.24)
GFR, Estimated: >60 mL/min (ref >60)
Glucose, Bld: 107 mg/dL — ABNORMAL HIGH (ref 70–99)
Potassium: 3.3 mmol/L — ABNORMAL LOW (ref 3.5–5.1)
Sodium: 136 mmol/L (ref 135–145)
Total Bilirubin: 6.1 mg/dL — ABNORMAL HIGH (ref 0.3–1.2)
Total Protein: 6.9 g/dL (ref 6.5–8.1)

## 2023-10-09 LAB — CBC
HCT: 36.7 % — ABNORMAL LOW (ref 39.0–52.0)
Hemoglobin: 11.9 g/dL — ABNORMAL LOW (ref 13.0–17.0)
MCH: 30.3 pg (ref 26.0–34.0)
MCHC: 32.4 g/dL (ref 30.0–36.0)
MCV: 93.4 fL (ref 80.0–100.0)
Platelets: 215 10*3/uL (ref 150–400)
RBC: 3.93 MIL/uL — ABNORMAL LOW (ref 4.22–5.81)
RDW: 14.5 % (ref 11.5–15.5)
WBC: 6.7 10*3/uL (ref 4.0–10.5)
nRBC: 0 % (ref 0.0–0.2)

## 2023-10-09 SURGERY — LAPAROSCOPIC CHOLECYSTECTOMY WITH INTRAOPERATIVE CHOLANGIOGRAM
Anesthesia: General

## 2023-10-09 MED ORDER — ONDANSETRON HCL 4 MG/2ML IJ SOLN
INTRAMUSCULAR | Status: AC
  Filled 2023-10-09: qty 2

## 2023-10-09 MED ORDER — FENTANYL CITRATE (PF) 100 MCG/2ML IJ SOLN
INTRAMUSCULAR | Status: AC
  Filled 2023-10-09: qty 2

## 2023-10-09 MED ORDER — POLYETHYLENE GLYCOL 3350 17 G PO PACK
17.0000 g | PACK | Freq: Every day | ORAL | Status: DC
  Administered 2023-10-10 – 2023-10-12 (×2): 17 g via ORAL
  Filled 2023-10-09 (×2): qty 1

## 2023-10-09 MED ORDER — BUPIVACAINE-EPINEPHRINE 0.25% -1:200000 IJ SOLN
INTRAMUSCULAR | Status: AC
  Filled 2023-10-09: qty 1

## 2023-10-09 MED ORDER — POTASSIUM CHLORIDE CRYS ER 20 MEQ PO TBCR
40.0000 meq | EXTENDED_RELEASE_TABLET | Freq: Two times a day (BID) | ORAL | Status: AC
  Administered 2023-10-09 (×2): 40 meq via ORAL
  Filled 2023-10-09 (×2): qty 2

## 2023-10-09 MED ORDER — CHLORHEXIDINE GLUCONATE 0.12 % MT SOLN
15.0000 mL | Freq: Once | OROMUCOSAL | Status: AC
  Administered 2023-10-09: 15 mL via OROMUCOSAL

## 2023-10-09 MED ORDER — PHENYLEPHRINE HCL-NACL 20-0.9 MG/250ML-% IV SOLN
INTRAVENOUS | Status: AC
  Filled 2023-10-09: qty 250

## 2023-10-09 MED ORDER — LIDOCAINE HCL (PF) 2 % IJ SOLN
INTRAMUSCULAR | Status: AC
  Filled 2023-10-09: qty 5

## 2023-10-09 MED ORDER — DEXAMETHASONE SODIUM PHOSPHATE 10 MG/ML IJ SOLN
INTRAMUSCULAR | Status: AC
  Filled 2023-10-09: qty 1

## 2023-10-09 MED ORDER — ROCURONIUM BROMIDE 10 MG/ML (PF) SYRINGE
PREFILLED_SYRINGE | INTRAVENOUS | Status: AC
  Filled 2023-10-09: qty 10

## 2023-10-09 NOTE — Evaluation (Signed)
Physical Therapy Evaluation Patient Details Name: Jonathan Lane MRN: [id number] DOB: 1941/04/13 Today's Date: 10/09/2023  History of Present Illness  82 yo male admitted with pancreatitis, cholangitis. Plan for ERCP and possible surgery later this week. Hx of TIA, Afib, ureteral Ca  Clinical Impression  On eval, pt was Ind with mobility. Per pt and chart review, plan is for ERCP and GI surgery later this week. Will plan to re-evaluate pt post-op to assess DME and f/u therapy needs.         If plan is discharge home, recommend the following:     Can travel by private vehicle        Equipment Recommendations None recommended by PT  Recommendations for Other Services       Functional Status Assessment Patient has had a recent decline in their functional status and demonstrates the ability to make significant improvements in function in a reasonable and predictable amount of time.     Precautions / Restrictions Precautions Precautions: None Restrictions Weight Bearing Restrictions: No      Mobility  Bed Mobility Overal bed mobility: Independent                  Transfers Overall transfer level: Independent                      Ambulation/Gait Ambulation/Gait assistance: Independent Gait Distance (Feet): 450 Feet Assistive device: IV Pole, None Gait Pattern/deviations: WFL(Within Functional Limits)          Stairs            Wheelchair Mobility     Tilt Bed    Modified Rankin (Stroke Patients Only)       Balance Overall balance assessment: No apparent balance deficits (not formally assessed)                                           Pertinent Vitals/Pain Pain Assessment Pain Assessment: No/denies pain    Home Living Family/patient expects to be discharged to:: Private residence Living Arrangements: Alone Available Help at Discharge: Friend(s) Type of Home: House           Home Equipment: Clinical biochemist (2 wheels)      Prior Function Prior Level of Function : Independent/Modified Independent                     Extremity/Trunk Assessment   Upper Extremity Assessment Upper Extremity Assessment: Overall WFL for tasks assessed    Lower Extremity Assessment Lower Extremity Assessment: Overall WFL for tasks assessed    Cervical / Trunk Assessment Cervical / Trunk Assessment: Normal  Communication   Communication Communication: No apparent difficulties  Cognition Arousal: Alert Behavior During Therapy: WFL for tasks assessed/performed Overall Cognitive Status: Within Functional Limits for tasks assessed                                          General Comments      Exercises     Assessment/Plan    PT Assessment Patient needs continued PT services  PT Problem List         PT Treatment Interventions Gait training;Functional mobility training;DME instruction;Therapeutic activities;Therapeutic exercise;Balance training;Patient/family education    PT Goals (Current goals can be  found in the Care Plan section)  Acute Rehab PT Goals PT Goal Formulation: With patient Time For Goal Achievement: 10/23/23 Potential to Achieve Goals: Good    Frequency Min 1X/week     Co-evaluation               AM-PAC PT "6 Clicks" Mobility  Outcome Measure Help needed turning from your back to your side while in a flat bed without using bedrails?: None Help needed moving from lying on your back to sitting on the side of a flat bed without using bedrails?: None Help needed moving to and from a bed to a chair (including a wheelchair)?: None Help needed standing up from a chair using your arms (e.g., wheelchair or bedside chair)?: None Help needed to walk in hospital room?: None Help needed climbing 3-5 steps with a railing? : None 6 Click Score: 24    End of Session   Activity Tolerance: Patient tolerated treatment well Patient left: with call  bell/phone within reach        Time: 0347-4259 PT Time Calculation (min) (ACUTE ONLY): 11 min   Charges:   PT Evaluation $PT Eval Low Complexity: 1 Low   PT General Charges $$ ACUTE PT VISIT: 1 Visit            Faye Ramsay, PT Acute Rehabilitation  Office: [phone]

## 2023-10-09 NOTE — Progress Notes (Signed)
Progress Note   Patient: Jonathan Lane ZOX:[id number] DOB: [birth year] DOA: 10/05/2023     3 DOS: the patient was seen and examined on 10/09/2023 at 11:22AM      Brief hospital course: Mr. Smeby is an 82 y.o. 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  GI and Gen surgery consulted.  Plan for ERCP then Lap Chole.     Assessment and Plan: * Cholangitis Acute biliary pancreatitis Admitted and general surgery and gastroenterology consulted.  MRCP confirmed CBD stone.  Initially there was consideration of laparoscopic cholecystectomy and IOC, but LFTs from today, so we will plan for ERCP, duct clearance, and subsequent lap chole - Continue Zosyn - Consult GI, plan for ERCP, 10/23 - Consult general surgery, plan for laparoscopic cholecystectomy after ERCP - Trend LFTs   Hypokalemia - Supplement K  OSA on CPAP - CPAP at night  Essential hypertension BP 150s. - Continue HCTZ, diltiazem - Hold losartan periop  Cerebrovascular disease Atrial fibrillation ruled out.  As part of workup for his previous TIA, he has had monitoring without signs of atrial fibrillation.  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 - Continue Lipitor - Hold Plavix          Subjective: Patient is feeling well.  He has had no vomiting.  His constipation is relieved.  No fever.  No confusion.     Physical Exam: BP (!) 154/78 (BP Location: Right Arm)   Pulse 71   Temp 97.9 F (36.6 C) (Oral)   Resp 16   Ht 6\' 1"  (1.854 m)   Wt 88.5 kg   SpO2 98%   BMI 25.73 kg/m   Adult male, jaundiced, lying in bed, interactive and appropriate RRR, no murmurs, no peripheral edema Respiratory rate normal, lungs clear without rales or wheezes Abdomen soft without tenderness palpation or guarding, no ascites  or distention Attention normal, affect normal, judgment and insight appear normal    Data Reviewed: GI Metabolic panel shows total bilirubin up to 6, LFTs trending up, potassium 3.2 CBC shows no leukocytosis, hemoglobin stable at 12  Family Communication: Girlfriend at the bedside    Disposition: Status is: Inpatient Patient was admitted with choledocholithiasis and cholangitis Continue antibiotics, plan for ERCP tomorrow, hopefully lap chole on Thursday, and discharge to home with home health on Friday if doing well        Author: Alberteen Sam, MD 10/09/2023 12:42 PM  For on call review [email or web address].

## 2023-10-09 NOTE — Progress Notes (Signed)
Pt's surgery cancelled per Dr. Derrell Lolling

## 2023-10-09 NOTE — Progress Notes (Signed)
   10/09/23 2216  BiPAP/CPAP/SIPAP  BiPAP/CPAP/SIPAP Pt Type Adult  Reason BIPAP/CPAP not in use Non-compliant   Pt refusing cpap for the night.

## 2023-10-09 NOTE — Plan of Care (Signed)

## 2023-10-09 NOTE — H&P (View-Only) (Signed)
Patient ID: Jonathan Lane, male   DOB: 02/08/1941, 82 y.o.   MRN: [id number]    Progress Note   Subjective   Day # 4 CC; acute gallstone pancreatitis, choledocholithiasis  Plavix on hold day 4  Labs this a.m.-potassium 3.3/BUN 9/creatinine 0.64 T. bili 6.1/alk phos 232/AST 275/ALT 318-up compared to yesterday  Patient says he feels better today than he did yesterday no significant abdominal pain, he has been eating without difficulty denies any nausea or vomiting. He was confused about why he would feel better if his numbers looked worse, all of his questions were answered.   Objective   Vital signs in last 24 hours: Temp:  [97.9 F (36.6 C)-98.6 F (37 C)] 97.9 F (36.6 C) (10/22 0756) Pulse Rate:  [58-71] 71 (10/22 0756) Resp:  [15-16] 16 (10/22 0756) BP: (129-172)/(60-85) 154/78 (10/22 0756) SpO2:  [94 %-98 %] 98 % (10/22 0756) Last BM Date : 10/08/23 General: Elderly white male    in NAD, sitting up in chair Heart:  Regular rate and rhythm; no murmurs Lungs: Respirations even and unlabored, lungs CTA bilaterally Abdomen:  Soft, nontender nondistended, normal bowel sounds. Extremities:  Without edema. Neurologic:  Alert and oriented,  grossly normal neurologically. Psych:  Cooperative. Normal mood and affect.  Intake/Output from previous day: 10/21 0701 - 10/22 0700 In: 793.5 [P.O.:720; IV Piggyback:73.5] Out: -  Intake/Output this shift: No intake/output data recorded.  Lab Results: Recent Labs    10/07/23 0324 10/08/23 0319 10/09/23 0326  WBC 7.3 5.9 6.7  HGB 11.4* 11.4* 11.9*  HCT 35.1* 35.2* 36.7*  PLT 144* 154 215   BMET Recent Labs    10/07/23 0324 10/08/23 0319 10/09/23 0326  NA 134* 134* 136  K 3.6 3.2* 3.3*  CL 101 98 101  CO2 25 26 25   GLUCOSE 115* 112* 107*  BUN 15 13 9   CREATININE 0.72 0.85 0.64  CALCIUM 8.3* 8.3* 8.8*   LFT Recent Labs    10/09/23 0326  PROT 6.9  ALBUMIN 2.9*  AST 275*  ALT 318*  ALKPHOS 232*  BILITOT  6.1*   PT/INR Recent Labs    10/07/23 0324  LABPROT 15.5*  INR 1.2        Assessment / Plan:    #1 old white male with acute gallstone pancreatitis, cholangitis and CBD stone on MRCP.  Clinically patient feels well, significantly improved since admission Surgery had seen patient yesterday and because parameters were improved had initially planned for lap chole today and IOC, however labs today show T. bili up again now up to 6.1 still consistent with retained common bile duct stone. 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 after midnight Continue IV Zosyn We will proceed with ERCP/stone extraction with Dr. Chales Abrahams tomorrow 10/10/2023 1:30 PM. Continue to hold Plavix Replace potassium-currently on 40 mill equivalents orally twice daily Continue MiraLAX once daily Hold Lovenox dose tomorrow Surgery will plan to reschedule laparoscopic cholecystectomy for later in the week post ERCP      Principal Problem:   Cholangitis Active Problems:   Cerebrovascular disease   Essential hypertension   OSA on CPAP   Hypokalemia   Acute biliary pancreatitis     LOS: 3 days   Amy Esterwood  PA-C 10/09/2023, 8:42 AM     Attending physician's note   I have taken history, reviewed the chart and examined the patient. I performed a substantive portion of this encounter, including complete performance of at least one of the key components, in conjunction with the APP. I agree with the Advanced Practitioner's note, impression and recommendations.   For ERCP in AM Discussed risks/benefits.   Edman Circle, MD [facility] [phone]

## 2023-10-09 NOTE — Progress Notes (Addendum)
Patient ID: Jonathan Lane, male   DOB: 02/08/1941, 82 y.o.   MRN: [id number]    Progress Note   Subjective   Day # 4 CC; acute gallstone pancreatitis, choledocholithiasis  Plavix on hold day 4  Labs this a.m.-potassium 3.3/BUN 9/creatinine 0.64 T. bili 6.1/alk phos 232/AST 275/ALT 318-up compared to yesterday  Patient says he feels better today than he did yesterday no significant abdominal pain, he has been eating without difficulty denies any nausea or vomiting. He was confused about why he would feel better if his numbers looked worse, all of his questions were answered.   Objective   Vital signs in last 24 hours: Temp:  [97.9 F (36.6 C)-98.6 F (37 C)] 97.9 F (36.6 C) (10/22 0756) Pulse Rate:  [58-71] 71 (10/22 0756) Resp:  [15-16] 16 (10/22 0756) BP: (129-172)/(60-85) 154/78 (10/22 0756) SpO2:  [94 %-98 %] 98 % (10/22 0756) Last BM Date : 10/08/23 General: Elderly white male    in NAD, sitting up in chair Heart:  Regular rate and rhythm; no murmurs Lungs: Respirations even and unlabored, lungs CTA bilaterally Abdomen:  Soft, nontender nondistended, normal bowel sounds. Extremities:  Without edema. Neurologic:  Alert and oriented,  grossly normal neurologically. Psych:  Cooperative. Normal mood and affect.  Intake/Output from previous day: 10/21 0701 - 10/22 0700 In: 793.5 [P.O.:720; IV Piggyback:73.5] Out: -  Intake/Output this shift: No intake/output data recorded.  Lab Results: Recent Labs    10/07/23 0324 10/08/23 0319 10/09/23 0326  WBC 7.3 5.9 6.7  HGB 11.4* 11.4* 11.9*  HCT 35.1* 35.2* 36.7*  PLT 144* 154 215   BMET Recent Labs    10/07/23 0324 10/08/23 0319 10/09/23 0326  NA 134* 134* 136  K 3.6 3.2* 3.3*  CL 101 98 101  CO2 25 26 25   GLUCOSE 115* 112* 107*  BUN 15 13 9   CREATININE 0.72 0.85 0.64  CALCIUM 8.3* 8.3* 8.8*   LFT Recent Labs    10/09/23 0326  PROT 6.9  ALBUMIN 2.9*  AST 275*  ALT 318*  ALKPHOS 232*  BILITOT  6.1*   PT/INR Recent Labs    10/07/23 0324  LABPROT 15.5*  INR 1.2        Assessment / Plan:    #1 old white male with acute gallstone pancreatitis, cholangitis and CBD stone on MRCP.  Clinically patient feels well, significantly improved since admission Surgery had seen patient yesterday and because parameters were improved had initially planned for lap chole today and IOC, however labs today show T. bili up again now up to 6.1 still consistent with retained common bile duct stone. 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 after midnight Continue IV Zosyn We will proceed with ERCP/stone extraction with Dr. Chales Abrahams tomorrow 10/10/2023 1:30 PM. Continue to hold Plavix Replace potassium-currently on 40 mill equivalents orally twice daily Continue MiraLAX once daily Hold Lovenox dose tomorrow Surgery will plan to reschedule laparoscopic cholecystectomy for later in the week post ERCP      Principal Problem:   Cholangitis Active Problems:   Cerebrovascular disease   Essential hypertension   OSA on CPAP   Hypokalemia   Acute biliary pancreatitis     LOS: 3 days   Amy Esterwood  PA-C 10/09/2023, 8:42 AM     Attending physician's note   I have taken history, reviewed the chart and examined the patient. I performed a substantive portion of this encounter, including complete performance of at least one of the key components, in conjunction with the APP. I agree with the Advanced Practitioner's note, impression and recommendations.   For ERCP in AM Discussed risks/benefits.   Edman Circle, MD [facility] [phone]

## 2023-10-09 NOTE — Progress Notes (Signed)
Day of Surgery   Subjective/Chief Complaint: Pt with some abd pain this AM Labs reviewed and elevated   Objective: Vital signs in last 24 hours: Temp:  [97.9 F (36.6 C)-98.6 F (37 C)] 97.9 F (36.6 C) (10/22 0756) Pulse Rate:  [58-71] 71 (10/22 0756) Resp:  [15-16] 16 (10/22 0756) BP: (129-172)/(60-85) 154/78 (10/22 0756) SpO2:  [94 %-98 %] 98 % (10/22 0756) Last BM Date : 10/08/23  Intake/Output from previous day: 10/21 0701 - 10/22 0700 In: 793.5 [P.O.:720; IV Piggyback:73.5] Out: -  Intake/Output this shift: No intake/output data recorded.  PE:  Constitutional: No acute distress, conversant, appears states age, jaunidiced Eyes: icteric sclerae, moist conjunctiva, no lid lag Lungs: Clear to auscultation bilaterally, normal respiratory effort CV: regular rate and rhythm, no murmurs, no peripheral edema, pedal pulses 2+ GI: Soft, no masses or hepatosplenomegaly, non-tender to palpation Skin: No rashes, palpation reveals normal turgor Psychiatric: appropriate judgment and insight, oriented to person, place, and time   Lab Results:  Recent Labs    10/08/23 0319 10/09/23 0326  WBC 5.9 6.7  HGB 11.4* 11.9*  HCT 35.2* 36.7*  PLT 154 215   BMET Recent Labs    10/08/23 0319 10/09/23 0326  NA 134* 136  K 3.2* 3.3*  CL 98 101  CO2 26 25  GLUCOSE 112* 107*  BUN 13 9  CREATININE 0.85 0.64  CALCIUM 8.3* 8.8*   PT/INR Recent Labs    10/07/23 0324  LABPROT 15.5*  INR 1.2   ABG No results for input(s): "PHART", "HCO3" in the last 72 hours.  Invalid input(s): "PCO2", "PO2"  Studies/Results: No results found.  Anti-infectives: Anti-infectives (From admission, onward)    Start     Dose/Rate Route Frequency Ordered Stop   10/06/23 0400  [MAR Hold]  piperacillin-tazobactam (ZOSYN) IVPB 3.375 g        (MAR Hold since Tue 10/09/2023 at 0626.Hold Reason: Transfer to a Procedural area)  Placed in "Followed by" Linked Group   3.375 g 12.5 mL/hr over 240  Minutes Intravenous Every 8 hours 10/05/23 2027     10/05/23 2030  piperacillin-tazobactam (ZOSYN) IVPB 3.375 g       Placed in "Followed by" Linked Group   3.375 g 100 mL/hr over 30 Minutes Intravenous  Once 10/05/23 2027 10/05/23 2125       Assessment/Plan: BIliary pancreatitis Choledocholithiasis  -Pt with elev TB and LFTs -Was planning on surgery today but with elev TB and likely CBD stone will await ERCP and clearance of duct and then surgery.  I d/w the pt the need for this order and he understands. -Con't abx -Following  LOS: 3 days    Axel Filler 10/09/2023

## 2023-10-10 ENCOUNTER — Encounter (HOSPITAL_COMMUNITY)
Admission: EM | Disposition: A | Discharge status: Home Health | Payer: Self-pay | Source: Ambulatory Visit | Attending: Family Medicine

## 2023-10-10 ENCOUNTER — Inpatient Hospital Stay (HOSPITAL_COMMUNITY): Payer: PPO

## 2023-10-10 ENCOUNTER — Encounter (HOSPITAL_COMMUNITY): Payer: Self-pay | Admitting: Family Medicine

## 2023-10-10 ENCOUNTER — Inpatient Hospital Stay (HOSPITAL_COMMUNITY): Payer: PPO | Admitting: Anesthesiology

## 2023-10-10 DIAGNOSIS — R7989 Other specified abnormal findings of blood chemistry: Secondary | ICD-10-CM

## 2023-10-10 DIAGNOSIS — G4733 Obstructive sleep apnea (adult) (pediatric): Secondary | ICD-10-CM | POA: Diagnosis not present

## 2023-10-10 DIAGNOSIS — K8050 Calculus of bile duct without cholangitis or cholecystitis without obstruction: Secondary | ICD-10-CM | POA: Diagnosis not present

## 2023-10-10 DIAGNOSIS — R932 Abnormal findings on diagnostic imaging of liver and biliary tract: Secondary | ICD-10-CM

## 2023-10-10 DIAGNOSIS — K8309 Other cholangitis: Secondary | ICD-10-CM

## 2023-10-10 DIAGNOSIS — K851 Biliary acute pancreatitis without necrosis or infection: Secondary | ICD-10-CM | POA: Diagnosis not present

## 2023-10-10 DIAGNOSIS — K2970 Gastritis, unspecified, without bleeding: Secondary | ICD-10-CM | POA: Diagnosis not present

## 2023-10-10 DIAGNOSIS — K838 Other specified diseases of biliary tract: Secondary | ICD-10-CM

## 2023-10-10 HISTORY — PX: BIOPSY: SHX5522

## 2023-10-10 HISTORY — PX: REMOVAL OF STONES: SHX5545

## 2023-10-10 HISTORY — PX: SPHINCTEROTOMY: SHX5544

## 2023-10-10 HISTORY — PX: ERCP: SHX5425

## 2023-10-10 LAB — COMPREHENSIVE METABOLIC PANEL
ALT: 284 U/L — ABNORMAL HIGH (ref 0–44)
AST: 193 U/L — ABNORMAL HIGH (ref 15–41)
Albumin: 2.9 g/dL — ABNORMAL LOW (ref 3.5–5.0)
Alkaline Phosphatase: 226 U/L — ABNORMAL HIGH (ref 38–126)
Anion gap: 10 (ref 5–15)
BUN: 13 mg/dL (ref 8–23)
CO2: 23 mmol/L (ref 22–32)
Calcium: 8.6 mg/dL — ABNORMAL LOW (ref 8.9–10.3)
Chloride: 99 mmol/L (ref 98–111)
Creatinine, Ser: 0.8 mg/dL (ref 0.61–1.24)
GFR, Estimated: >60 mL/min (ref >60)
Glucose, Bld: 107 mg/dL — ABNORMAL HIGH (ref 70–99)
Potassium: 4 mmol/L (ref 3.5–5.1)
Sodium: 132 mmol/L — ABNORMAL LOW (ref 135–145)
Total Bilirubin: 3.3 mg/dL — ABNORMAL HIGH (ref 0.3–1.2)
Total Protein: 6.9 g/dL (ref 6.5–8.1)

## 2023-10-10 SURGERY — LAPAROSCOPIC CHOLECYSTECTOMY
Anesthesia: General

## 2023-10-10 SURGERY — ERCP, WITH INTERVENTION IF INDICATED
Anesthesia: General

## 2023-10-10 MED ORDER — EPHEDRINE SULFATE (PRESSORS) 50 MG/ML IJ SOLN
INTRAMUSCULAR | Status: DC | PRN
  Administered 2023-10-10: 10 mg via INTRAVENOUS

## 2023-10-10 MED ORDER — DICLOFENAC SUPPOSITORY 100 MG
RECTAL | Status: AC
  Filled 2023-10-10: qty 1

## 2023-10-10 MED ORDER — FENTANYL CITRATE (PF) 100 MCG/2ML IJ SOLN
INTRAMUSCULAR | Status: AC
  Filled 2023-10-10: qty 2

## 2023-10-10 MED ORDER — DEXAMETHASONE SODIUM PHOSPHATE 4 MG/ML IJ SOLN
INTRAMUSCULAR | Status: DC | PRN
  Administered 2023-10-10: 4 mg via INTRAVENOUS

## 2023-10-10 MED ORDER — FENTANYL CITRATE (PF) 100 MCG/2ML IJ SOLN
INTRAMUSCULAR | Status: DC | PRN
  Administered 2023-10-10: 50 ug via INTRAVENOUS

## 2023-10-10 MED ORDER — LIDOCAINE HCL (CARDIAC) PF 100 MG/5ML IV SOSY
PREFILLED_SYRINGE | INTRAVENOUS | Status: DC | PRN
  Administered 2023-10-10: 80 mg via INTRAVENOUS

## 2023-10-10 MED ORDER — ROCURONIUM BROMIDE 100 MG/10ML IV SOLN
INTRAVENOUS | Status: DC | PRN
  Administered 2023-10-10: 50 mg via INTRAVENOUS

## 2023-10-10 MED ORDER — SUGAMMADEX SODIUM 200 MG/2ML IV SOLN
INTRAVENOUS | Status: DC | PRN
  Administered 2023-10-10: 200 mg via INTRAVENOUS

## 2023-10-10 MED ORDER — PANTOPRAZOLE SODIUM 40 MG PO TBEC
40.0000 mg | DELAYED_RELEASE_TABLET | Freq: Every day | ORAL | Status: DC
  Administered 2023-10-10 – 2023-10-12 (×3): 40 mg via ORAL
  Filled 2023-10-10 (×3): qty 1

## 2023-10-10 MED ORDER — SODIUM CHLORIDE 0.9 % IV SOLN
INTRAVENOUS | Status: DC | PRN
  Administered 2023-10-10: 14 mL

## 2023-10-10 MED ORDER — PHENYLEPHRINE HCL (PRESSORS) 10 MG/ML IV SOLN
INTRAVENOUS | Status: DC | PRN
  Administered 2023-10-10: 120 ug via INTRAVENOUS

## 2023-10-10 MED ORDER — PROPOFOL 10 MG/ML IV BOLUS
INTRAVENOUS | Status: AC
Start: 2023-10-10 — End: ?
  Filled 2023-10-10: qty 20

## 2023-10-10 MED ORDER — GLUCAGON HCL RDNA (DIAGNOSTIC) 1 MG IJ SOLR
INTRAMUSCULAR | Status: DC | PRN
  Administered 2023-10-10: .5 mg via INTRAVENOUS

## 2023-10-10 MED ORDER — ONDANSETRON HCL 4 MG/2ML IJ SOLN
INTRAMUSCULAR | Status: DC | PRN
  Administered 2023-10-10: 4 mg via INTRAVENOUS

## 2023-10-10 MED ORDER — SODIUM CHLORIDE 0.9 % IV SOLN
INTRAVENOUS | Status: DC | PRN
Start: 2023-10-10 — End: 2023-10-10

## 2023-10-10 MED ORDER — DICLOFENAC SUPPOSITORY 100 MG
RECTAL | Status: DC | PRN
  Administered 2023-10-10: 100 mg via RECTAL

## 2023-10-10 MED ORDER — PROPOFOL 10 MG/ML IV BOLUS
INTRAVENOUS | Status: DC | PRN
  Administered 2023-10-10: 120 mg via INTRAVENOUS

## 2023-10-10 MED ORDER — GLUCAGON HCL RDNA (DIAGNOSTIC) 1 MG IJ SOLR
INTRAMUSCULAR | Status: AC
  Filled 2023-10-10: qty 2

## 2023-10-10 NOTE — Progress Notes (Signed)
   10/10/23 2138  BiPAP/CPAP/SIPAP  BiPAP/CPAP/SIPAP Pt Type Adult (Patient prefers self placement when ready for bed.)  BiPAP/CPAP/SIPAP DREAMSTATIOND  Mask Type Nasal mask  Mask Size Medium  EPAP 5 cmH2O (settings per patient comfort)  FiO2 (%) 21 %  Patient Home Equipment No  Auto Titrate No  CPAP/SIPAP surface wiped down Yes

## 2023-10-10 NOTE — Transfer of Care (Signed)
Immediate Anesthesia Transfer of Care Note  Patient: Jonathan Lane  Procedure(s) Performed: ENDOSCOPIC RETROGRADE CHOLANGIOPANCREATOGRAPHY (ERCP) SPHINCTEROTOMY REMOVAL OF SLUDGE BIOPSY  Patient Location: Endoscopy Unit  Anesthesia Type:General  Level of Consciousness: awake, alert , and oriented  Airway & Oxygen Therapy: Patient Spontanous Breathing and Patient connected to nasal cannula oxygen  Post-op Assessment: Report given to RN and Post -op Vital signs reviewed and stable  Post vital signs: Reviewed and stable  Last Vitals:  Vitals Value Taken Time  BP    Temp    Pulse 62 10/10/23 0907  Resp 19 10/10/23 0907  SpO2 98 % 10/10/23 0907  Vitals shown include unfiled device data.  Last Pain:  Vitals:   10/10/23 0720  TempSrc: Temporal  PainSc: 0-No pain      Patients Stated Pain Goal: 0 (10/08/23 2123)  Complications: No notable events documented.

## 2023-10-10 NOTE — Plan of Care (Signed)
  Problem: Pain Managment: Goal: General experience of comfort will improve Outcome: Progressing   Problem: Nutrition: Goal: Adequate nutrition will be maintained Outcome: Progressing   Problem: Activity: Goal: Risk for activity intolerance will decrease Outcome: Progressing   Problem: Safety: Goal: Ability to remain free from injury will improve Outcome: Progressing

## 2023-10-10 NOTE — Anesthesia Procedure Notes (Signed)
Procedure Name: Intubation Date/Time: 10/10/2023 8:14 AM  Performed by: Nathen May, CRNAPre-anesthesia Checklist: Patient identified, Emergency Drugs available, Suction available and Patient being monitored Patient Re-evaluated:Patient Re-evaluated prior to induction Oxygen Delivery Method: Circle System Utilized Preoxygenation: Pre-oxygenation with 100% oxygen Induction Type: IV induction Ventilation: Mask ventilation without difficulty Laryngoscope Size: Glidescope and 4 Grade View: Grade I Tube type: Oral Tube size: 7.5 mm Number of attempts: 1 Airway Equipment and Method: Stylet and Oral airway Placement Confirmation: ETT inserted through vocal cords under direct vision, positive ETCO2 and breath sounds checked- equal and bilateral Secured at: 23 cm Tube secured with: Tape Dental Injury: Teeth and Oropharynx as per pre-operative assessment

## 2023-10-10 NOTE — Progress Notes (Signed)
Progress Note   Patient: Jonathan Lane VOZ:[id number] DOB: Jun 10, 1941 DOA: 10/05/2023     4 DOS: the patient was seen and examined on 10/10/2023 at 11:22AM      Brief hospital course: Mr. Beutler is an 82 y.o. 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  Started on antibiotics.  GI and surgery consulted. 10/23, underwent ERCP.  Plan for lap chole tomorrow.  Assessment and Plan: Acute Cholangitis Acute biliary pancreatitis Admitted and general surgery and gastroenterology consulted.  MRCP confirmed CBD stone.  Continue IV fluids.  Continue IV Zosyn.  Status post ERCP today with sphincterotomy and removal of debris.  Clinically improving.  Lap chole planned for tomorrow.  LFTs trending down.   Hypokalemia - Supplement K.  Adequate.  OSA on CPAP - CPAP at night  Essential hypertension BP 150s. - Continue HCTZ, diltiazem - Hold losartan periop  Cerebrovascular disease Atrial fibrillation ruled out.  As part of workup for his previous TIA, he has had monitoring without signs of atrial fibrillation.  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 - Continue Lipitor - Hold Plavix          Subjective: Patient came back from procedure.  Pleasant to interaction.  Denies any complaints.  He is looking forward to have surgery early morning tomorrow and possibly going home after surgery.  Denies any nausea vomiting or abdominal pain.   Physical Exam: BP (!) 140/75   Pulse 66   Temp (!) 97.5 F (36.4 C)   Resp 18   Ht 6\' 1"  (1.854 m)   Wt 88 kg   SpO2 96%   BMI 25.60 kg/m   General: Looks fairly comfortable sitting in chair. Cardiovascular: S1-S2 normal.  Regular rate rhythm. Respiratory: Bilateral clear.  No added sounds. Gastrointestinal: Soft.   Mild tenderness right upper quadrant without rigidity or guarding.  Bowel sound present. Ext: No swelling or edema.  No cyanosis. Neuro: Alert awake and oriented.  No focal deficits. Musculoskeletal: No deformities.     Data Reviewed: GI Labs reviewed.  Family Communication: None at the bedside.    Disposition: Status is: Inpatient Patient was admitted with choledocholithiasis and cholangitis Continue antibiotics, plan for lap chole on Thursday, and discharge to home with home health on Friday if doing well        Author: Dorcas Carrow, MD 10/10/2023 10:55 AM  For on call review [email or web address].

## 2023-10-10 NOTE — Anesthesia Postprocedure Evaluation (Signed)
Anesthesia Post Note  Patient: Quadree Asberry Cutting  Procedure(s) Performed: ENDOSCOPIC RETROGRADE CHOLANGIOPANCREATOGRAPHY (ERCP) SPHINCTEROTOMY REMOVAL OF SLUDGE BIOPSY     Patient location during evaluation: Endoscopy Anesthesia Type: General Level of consciousness: awake and alert Pain management: pain level controlled Vital Signs Assessment: post-procedure vital signs reviewed and stable Respiratory status: spontaneous breathing, nonlabored ventilation, respiratory function stable and patient connected to nasal cannula oxygen Cardiovascular status: blood pressure returned to baseline and stable Postop Assessment: no apparent nausea or vomiting Anesthetic complications: no   No notable events documented.  Last Vitals:  Vitals:   10/10/23 0927 10/10/23 0944  BP: (!) 153/72 (!) 140/75  Pulse: 64 66  Resp: 17 18  Temp:  (!) 36.4 C  SpO2: 96% 96%    Last Pain:  Vitals:   10/10/23 0927  TempSrc:   PainSc: 0-No pain                 Collene Schlichter

## 2023-10-10 NOTE — Op Note (Signed)
[facility] Patient Name: Jonathan Lane Procedure Date: 10/10/2023 MRN: [id number] Attending MD: Corliss Parish , MD, [id number] Date of Birth: [birth year] CSN: [id number] Age: 82 Admit Type: Inpatient Procedure:                ERCP Indications:              Bile duct stone(s), Abnormal MRCP, Jaundice,                            Elevated liver enzymes Providers:                Norman Clay, RN, Melany Guernsey, Technician,                            Corliss Parish, MD Referring MD:             Inpatient medical service/surgical service Medicines:                General Anesthesia, Diclofenac 100 mg rectal,                            Glucagon 0.5 mg IV, Zosyn 3.375 g IV (as per                            scheduled dosing inhouse) Complications:            No immediate complications. Estimated Blood Loss:     Estimated blood loss was minimal. Procedure:                Pre-Anesthesia Assessment:                           - Prior to the procedure, a History and Physical                            was performed, and patient medications and                            allergies were reviewed. The patient's tolerance of                            previous anesthesia was also reviewed. The risks                            and benefits of the procedure and the sedation                            options and risks were discussed with the patient.                            All questions were answered, and informed consent                            was obtained. Prior Anticoagulants: The patient has  taken Plavix (clopidogrel), last dose was 5 days                            prior to procedure. ASA Grade Assessment: III - A                            patient with severe systemic disease. After                            reviewing the risks and benefits, the patient was                            deemed in satisfactory condition to undergo the                             procedure.                           After obtaining informed consent, the scope was                            passed under direct vision. Throughout the                            procedure, the patient's blood pressure, pulse, and                            oxygen saturations were monitored continuously. The                            TJF-Q190V ([id number] Olympus duodenoscope was                            introduced through the mouth, and used to inject                            contrast into and used to inject contrast into the                            bile duct. The ERCP was accomplished without                            difficulty. The patient tolerated the procedure. Scope In: Scope Out: Findings:      The scout film was normal.      The upper GI tract was traversed under direct vision without detailed       examination. The Z-line was regular and was found 45 cm from the       incisors. Patchy mild inflammation characterized by erosions and       erythema was found in the entire examined stomach - biopsied for HP       evaluation. No gross lesions were noted in the duodenal bulb, in the       first portion of the duodenum and in the second portion of the duodenum.  The major papilla was normal.      A short 0.035 inch Soft Jagwire was passed into the biliary tree on       first attempt. The Hydratome sphincterotome was passed over the       guidewire and the bile duct was then deeply cannulated. Contrast was       injected. I personally interpreted the bile duct images. Ductal flow of       contrast was adequate. Image quality was adequate. Contrast extended to       the hepatic ducts. Opacification of the entire biliary tree was       successful. The maximum diameter of the ducts was 9 mm. The lower third       of the main bile duct contained filling defect thought to be sludge. A       10 mm biliary sphincterotomy was made with a monofilament  Hydratome       sphincterotome using ERBE electrocautery. There was no       post-sphincterotomy bleeding. To discover objects, the biliary tree was       swept with a retrieval balloon. Sludge was swept from the duct. An       occlusion cholangiogram was performed that showed no further significant       biliary pathology.      A formal pancreatogram was not performed.      The duodenoscope was withdrawn from the patient. Impression:               - Z-line regular, 45 cm from the incisors.                           - Gastritis. Biopsied.                           - No gross lesions in the duodenal bulb, in the                            first portion of the duodenum and in the second                            portion of the duodenum.                           - The major papilla appeared normal.                           - The fluoroscopic examination was suspicious for                            sludge.                           - A biliary sphincterotomy was performed.                           - The biliary tree was swept and sludge was found. Moderate Sedation:      Not Applicable - Patient had care per Anesthesia. Recommendation:           - The patient will be observed post-procedure,  until all discharge criteria are met.                           - Return patient to hospital ward for ongoing care.                           - Advance diet as tolerated.                           - Start Protonix 40 mg daily.                           - Hold restart of Plavix for at least 48 hours to                            decrease risk of post-interventional bleeding (this                            is also so patient can undergo cholecystectomy                            inhouse).                           - Observe patient's clinical course.                           - Check liver enzymes (AST, ALT, alkaline                            phosphatase, bilirubin) in  the morning.                           - Watch for pancreatitis, bleeding, perforation,                            and cholangitis.                           - Await path results.                           - The findings and recommendations were discussed                            with the patient.                           - The findings and recommendations were discussed                            with the referring physician. Procedure Code(s):        --- Professional ---                           [phone], Endoscopic retrograde  cholangiopancreatography (ERCP); with removal of                            calculi/debris from biliary/pancreatic duct(s)                           43262, Endoscopic retrograde                            cholangiopancreatography (ERCP); with                            sphincterotomy/papillotomy                           74328, 26, Endoscopic catheterization of the                            biliary ductal system, radiological supervision and                            interpretation Diagnosis Code(s):        --- Professional ---                           K29.70, Gastritis, unspecified, without bleeding                           K80.50, Calculus of bile duct without cholangitis                            or cholecystitis without obstruction                           R17, Unspecified jaundice                           R74.8, Abnormal levels of other serum enzymes                           R93.2, Abnormal findings on diagnostic imaging of                            liver and biliary tract CPT copyright 2022 American Medical Association. All rights reserved. The codes documented in this report are preliminary and upon coder review may  be revised to meet current compliance requirements. Corliss Parish, MD 10/10/2023 8:58:49 AM Number of Addenda: 0

## 2023-10-10 NOTE — Anesthesia Preprocedure Evaluation (Addendum)
Anesthesia Evaluation  Patient identified by MRN, date of birth, ID band Patient awake    Reviewed: Allergy & Precautions, NPO status , Patient's Chart, lab work & pertinent test results  Airway Mallampati: I  TM Distance: <3 FB Neck ROM: Full    Dental  (+) Teeth Intact, Dental Advisory Given, Caps,    Pulmonary sleep apnea and Continuous Positive Airway Pressure Ventilation , former smoker   Pulmonary exam normal breath sounds clear to auscultation       Cardiovascular hypertension, Pt. on medications + Peripheral Vascular Disease  Normal cardiovascular exam+ dysrhythmias Atrial Fibrillation  Rhythm:Regular Rate:Normal     Neuro/Psych TIA   GI/Hepatic ,GERD  Medicated,,Gallstone pancreatitis, bile duct stone   Endo/Other  negative endocrine ROS    Renal/GU negative Renal ROS   ureteral CA s/p partial ureterectomy    Musculoskeletal negative musculoskeletal ROS (+)    Abdominal   Peds  Hematology  (+) Blood dyscrasia (Plavix)   Anesthesia Other Findings Day of surgery medications reviewed with the patient.  Reproductive/Obstetrics                             Anesthesia Physical Anesthesia Plan  ASA: 3  Anesthesia Plan: General   Post-op Pain Management: Minimal or no pain anticipated   Induction: Intravenous  PONV Risk Score and Plan: 2 and Dexamethasone and Ondansetron  Airway Management Planned: Oral ETT  Additional Equipment:   Intra-op Plan:   Post-operative Plan: Extubation in OR  Informed Consent: I have reviewed the patients History and Physical, chart, labs and discussed the procedure including the risks, benefits and alternatives for the proposed anesthesia with the patient or authorized representative who has indicated his/her understanding and acceptance.     Dental advisory given  Plan Discussed with: CRNA  Anesthesia Plan Comments:          Anesthesia Quick Evaluation

## 2023-10-10 NOTE — Interval H&P Note (Signed)
History and Physical Interval Note:  10/10/2023 7:59 AM  Jonathan Lane  has presented today for surgery, with the diagnosis of Gallstone pancreatitis, bile duct stone.  The various methods of treatment have been discussed with the patient and family. After consideration of risks, benefits and other options for treatment, the patient has consented to  Procedure(s): ENDOSCOPIC RETROGRADE CHOLANGIOPANCREATOGRAPHY (ERCP) (N/A) as a surgical intervention.  The patient's history has been reviewed, patient examined, no change in status, stable for surgery.  I have reviewed the patient's chart and labs.  Questions were answered to the patient's satisfaction.    The risks of an ERCP were discussed at length, including but not limited to the risk of perforation, bleeding, abdominal pain, post-ERCP pancreatitis (while usually mild can be severe and even life threatening).    [employer]

## 2023-10-11 ENCOUNTER — Encounter (HOSPITAL_COMMUNITY)
Admission: EM | Disposition: A | Discharge status: Home Health | Payer: Self-pay | Source: Ambulatory Visit | Attending: Family Medicine

## 2023-10-11 ENCOUNTER — Inpatient Hospital Stay (HOSPITAL_COMMUNITY): Payer: PPO | Admitting: Anesthesiology

## 2023-10-11 ENCOUNTER — Other Ambulatory Visit: Payer: Self-pay

## 2023-10-11 ENCOUNTER — Encounter (HOSPITAL_COMMUNITY): Payer: Self-pay | Admitting: Family Medicine

## 2023-10-11 DIAGNOSIS — K819 Cholecystitis, unspecified: Secondary | ICD-10-CM

## 2023-10-11 DIAGNOSIS — G4733 Obstructive sleep apnea (adult) (pediatric): Secondary | ICD-10-CM

## 2023-10-11 DIAGNOSIS — K8309 Other cholangitis: Secondary | ICD-10-CM | POA: Diagnosis not present

## 2023-10-11 HISTORY — PX: CHOLECYSTECTOMY: SHX55

## 2023-10-11 LAB — CULTURE, BLOOD (ROUTINE X 2)
Culture: NO GROWTH
Culture: NO GROWTH

## 2023-10-11 LAB — SURGICAL PATHOLOGY

## 2023-10-11 SURGERY — LAPAROSCOPIC CHOLECYSTECTOMY
Anesthesia: General

## 2023-10-11 MED ORDER — BUPIVACAINE-EPINEPHRINE (PF) 0.25% -1:200000 IJ SOLN
INTRAMUSCULAR | Status: DC | PRN
  Administered 2023-10-11: 16 mL

## 2023-10-11 MED ORDER — EPHEDRINE SULFATE (PRESSORS) 50 MG/ML IJ SOLN
INTRAMUSCULAR | Status: DC | PRN
  Administered 2023-10-11: 5 mg via INTRAVENOUS

## 2023-10-11 MED ORDER — LACTATED RINGERS IV SOLN: INTRAVENOUS | Status: DC | PRN

## 2023-10-11 MED ORDER — ROCURONIUM BROMIDE 100 MG/10ML IV SOLN
INTRAVENOUS | Status: DC | PRN
  Administered 2023-10-11: 50 mg via INTRAVENOUS

## 2023-10-11 MED ORDER — FENTANYL CITRATE PF 50 MCG/ML IJ SOSY
PREFILLED_SYRINGE | INTRAMUSCULAR | Status: AC
  Administered 2023-10-11: 50 ug via INTRAVENOUS
  Filled 2023-10-11: qty 2

## 2023-10-11 MED ORDER — GLYCOPYRROLATE 0.2 MG/ML IJ SOLN
INTRAMUSCULAR | Status: AC
  Filled 2023-10-11: qty 1

## 2023-10-11 MED ORDER — ONDANSETRON HCL 4 MG/2ML IJ SOLN: 4.0000 mg | Freq: Once | INTRAMUSCULAR | Status: DC | PRN

## 2023-10-11 MED ORDER — FENTANYL CITRATE (PF) 250 MCG/5ML IJ SOLN
INTRAMUSCULAR | Status: AC
  Filled 2023-10-11: qty 5

## 2023-10-11 MED ORDER — HYDROMORPHONE HCL 1 MG/ML IJ SOLN
INTRAMUSCULAR | Status: AC
  Administered 2023-10-11: 0.5 mg via INTRAVENOUS
  Filled 2023-10-11: qty 1

## 2023-10-11 MED ORDER — ROCURONIUM BROMIDE 10 MG/ML (PF) SYRINGE
PREFILLED_SYRINGE | INTRAVENOUS | Status: AC
  Filled 2023-10-11: qty 10

## 2023-10-11 MED ORDER — ACETAMINOPHEN 500 MG PO TABS
1000.0000 mg | ORAL_TABLET | Freq: Four times a day (QID) | ORAL | Status: DC
  Administered 2023-10-11 – 2023-10-12 (×3): 1000 mg via ORAL
  Filled 2023-10-11 (×4): qty 2

## 2023-10-11 MED ORDER — LACTATED RINGERS IR SOLN
Status: DC | PRN
  Administered 2023-10-11: 1000 mL

## 2023-10-11 MED ORDER — TRAMADOL HCL 50 MG PO TABS: 50.0000 mg | ORAL_TABLET | Freq: Four times a day (QID) | ORAL | Status: DC | PRN

## 2023-10-11 MED ORDER — OXYCODONE HCL 5 MG PO TABS: 5.0000 mg | ORAL_TABLET | Freq: Once | ORAL | Status: DC | PRN

## 2023-10-11 MED ORDER — FENTANYL CITRATE (PF) 100 MCG/2ML IJ SOLN
INTRAMUSCULAR | Status: DC | PRN
  Administered 2023-10-11: 75 ug via INTRAVENOUS
  Administered 2023-10-11: 50 ug via INTRAVENOUS
  Administered 2023-10-11: 75 ug via INTRAVENOUS
  Administered 2023-10-11: 50 ug via INTRAVENOUS

## 2023-10-11 MED ORDER — EPHEDRINE 5 MG/ML INJ
INTRAVENOUS | Status: AC
  Filled 2023-10-11: qty 5

## 2023-10-11 MED ORDER — ONDANSETRON HCL 4 MG/2ML IJ SOLN
INTRAMUSCULAR | Status: DC | PRN
  Administered 2023-10-11: 4 mg via INTRAVENOUS

## 2023-10-11 MED ORDER — FENTANYL CITRATE PF 50 MCG/ML IJ SOSY
PREFILLED_SYRINGE | INTRAMUSCULAR | Status: AC
  Administered 2023-10-11: 25 ug via INTRAVENOUS
  Filled 2023-10-11: qty 1

## 2023-10-11 MED ORDER — LIDOCAINE HCL (PF) 2 % IJ SOLN
INTRAMUSCULAR | Status: AC
Start: 2023-10-11 — End: ?
  Filled 2023-10-11: qty 5

## 2023-10-11 MED ORDER — PROPOFOL 10 MG/ML IV BOLUS
INTRAVENOUS | Status: DC | PRN
  Administered 2023-10-11: 200 mg via INTRAVENOUS

## 2023-10-11 MED ORDER — PROPOFOL 10 MG/ML IV BOLUS
INTRAVENOUS | Status: AC
  Filled 2023-10-11: qty 20

## 2023-10-11 MED ORDER — ONDANSETRON HCL 4 MG/2ML IJ SOLN
INTRAMUSCULAR | Status: AC
  Filled 2023-10-11: qty 2

## 2023-10-11 MED ORDER — BUPIVACAINE-EPINEPHRINE 0.25% -1:200000 IJ SOLN
INTRAMUSCULAR | Status: AC
  Filled 2023-10-11: qty 1

## 2023-10-11 MED ORDER — PHENYLEPHRINE 80 MCG/ML (10ML) SYRINGE FOR IV PUSH (FOR BLOOD PRESSURE SUPPORT)
PREFILLED_SYRINGE | INTRAVENOUS | Status: DC | PRN
  Administered 2023-10-11: 80 ug via INTRAVENOUS

## 2023-10-11 MED ORDER — LIDOCAINE HCL (CARDIAC) PF 100 MG/5ML IV SOSY
PREFILLED_SYRINGE | INTRAVENOUS | Status: DC | PRN
  Administered 2023-10-11: 60 mg via INTRAVENOUS

## 2023-10-11 MED ORDER — ARTIFICIAL TEARS OPHTHALMIC OINT
TOPICAL_OINTMENT | OPHTHALMIC | Status: AC
  Filled 2023-10-11: qty 3.5

## 2023-10-11 MED ORDER — DEXAMETHASONE SODIUM PHOSPHATE 10 MG/ML IJ SOLN
INTRAMUSCULAR | Status: AC
  Filled 2023-10-11: qty 1

## 2023-10-11 MED ORDER — 0.9 % SODIUM CHLORIDE (POUR BTL) OPTIME
TOPICAL | Status: DC | PRN
  Administered 2023-10-11: 1000 mL

## 2023-10-11 MED ORDER — GLYCOPYRROLATE 0.2 MG/ML IJ SOLN
INTRAMUSCULAR | Status: DC | PRN
  Administered 2023-10-11 (×2): .1 mg via INTRAVENOUS

## 2023-10-11 MED ORDER — DEXAMETHASONE SODIUM PHOSPHATE 10 MG/ML IJ SOLN
INTRAMUSCULAR | Status: DC | PRN
  Administered 2023-10-11: 8 mg via INTRAVENOUS

## 2023-10-11 MED ORDER — OXYCODONE HCL 5 MG/5ML PO SOLN: 5.0000 mg | Freq: Once | ORAL | Status: DC | PRN

## 2023-10-11 MED ORDER — HYDROMORPHONE HCL 1 MG/ML IJ SOLN
0.2500 mg | INTRAMUSCULAR | Status: DC | PRN
  Administered 2023-10-11: 0.25 mg via INTRAVENOUS

## 2023-10-11 MED ORDER — FENTANYL CITRATE PF 50 MCG/ML IJ SOSY
25.0000 ug | PREFILLED_SYRINGE | INTRAMUSCULAR | Status: DC | PRN
  Administered 2023-10-11: 50 ug via INTRAVENOUS
  Administered 2023-10-11: 25 ug via INTRAVENOUS

## 2023-10-11 MED ORDER — SUGAMMADEX SODIUM 200 MG/2ML IV SOLN
INTRAVENOUS | Status: DC | PRN
  Administered 2023-10-11: 200 mg via INTRAVENOUS

## 2023-10-11 SURGICAL SUPPLY — 40 items
ADH SKN CLS APL DERMABOND .7 (GAUZE/BANDAGES/DRESSINGS) ×1
APL PRP STRL LF DISP 70% ISPRP (MISCELLANEOUS) ×1
APPLIER CLIP 5 13 M/L LIGAMAX5 (MISCELLANEOUS)
APR CLP MED LRG 5 ANG JAW (MISCELLANEOUS)
BAG COUNTER SPONGE SURGICOUNT (BAG) IMPLANT
BAG SPEC RTRVL 10 TROC 200 (ENDOMECHANICALS)
BAG SPNG CNTER NS LX DISP (BAG)
CABLE HIGH FREQUENCY MONO STRZ (ELECTRODE) ×1 IMPLANT
CHLORAPREP W/TINT 26 (MISCELLANEOUS) ×1 IMPLANT
CLIP APPLIE 5 13 M/L LIGAMAX5 (MISCELLANEOUS) IMPLANT
CLIP LIGATING HEMO O LOK GREEN (MISCELLANEOUS) ×1 IMPLANT
COVER MAYO STAND XLG (MISCELLANEOUS) IMPLANT
COVER PROBE U/S 5X48 (MISCELLANEOUS) IMPLANT
DERMABOND ADVANCED .7 DNX12 (GAUZE/BANDAGES/DRESSINGS) ×1 IMPLANT
DRAPE C-ARM 42X120 X-RAY (DRAPES) IMPLANT
ELECT REM PT RETURN 15FT ADLT (MISCELLANEOUS) ×1 IMPLANT
GAUZE SPONGE 2X2 8PLY STRL LF (GAUZE/BANDAGES/DRESSINGS) ×1 IMPLANT
GLOVE BIO SURGEON STRL SZ7.5 (GLOVE) ×1 IMPLANT
GOWN STRL REUS W/ TWL XL LVL3 (GOWN DISPOSABLE) ×1 IMPLANT
GOWN STRL REUS W/TWL XL LVL3 (GOWN DISPOSABLE) ×1
GRASPER SUT TROCAR 14GX15 (MISCELLANEOUS) IMPLANT
HEMOSTAT SNOW SURGICEL 2X4 (HEMOSTASIS) IMPLANT
IRRIG SUCT STRYKERFLOW 2 WTIP (MISCELLANEOUS) ×1
IRRIGATION SUCT STRKRFLW 2 WTP (MISCELLANEOUS) ×1 IMPLANT
KIT BASIN OR (CUSTOM PROCEDURE TRAY) ×1 IMPLANT
KIT TURNOVER KIT A (KITS) IMPLANT
NDL INSUFFLATION 14GA 120MM (NEEDLE) ×1 IMPLANT
NEEDLE INSUFFLATION 14GA 120MM (NEEDLE) ×1 IMPLANT
PENCIL SMOKE EVACUATOR (MISCELLANEOUS) IMPLANT
POUCH RETRIEVAL ECOSAC 10 (ENDOMECHANICALS) IMPLANT
SCISSORS LAP 5X35 DISP (ENDOMECHANICALS) ×1 IMPLANT
SET CHOLANGIOGRAPH MIX (MISCELLANEOUS) IMPLANT
SET TUBE SMOKE EVAC HIGH FLOW (TUBING) ×1 IMPLANT
SLEEVE Z-THREAD 5X100MM (TROCAR) ×1 IMPLANT
SPIKE FLUID TRANSFER (MISCELLANEOUS) ×1 IMPLANT
SUT MNCRL AB 4-0 PS2 18 (SUTURE) ×1 IMPLANT
TOWEL OR 17X26 10 PK STRL BLUE (TOWEL DISPOSABLE) ×1 IMPLANT
TRAY LAPAROSCOPIC (CUSTOM PROCEDURE TRAY) ×1 IMPLANT
TROCAR 11X100 Z THREAD (TROCAR) ×1 IMPLANT
TROCAR Z-THREAD OPTICAL 5X100M (TROCAR) ×1 IMPLANT

## 2023-10-11 NOTE — Transfer of Care (Signed)
Immediate Anesthesia Transfer of Care Note  Patient: Jonathan Lane  Procedure(s) Performed: LAPAROSCOPIC CHOLECYSTECTOMY  Patient Location: PACU  Anesthesia Type:General  Level of Consciousness: awake, alert , and oriented  Airway & Oxygen Therapy: Patient Spontanous Breathing and Patient connected to face mask oxygen  Post-op Assessment: Report given to RN and Post -op Vital signs reviewed and stable  Post vital signs: Reviewed and stable  Last Vitals:  Vitals Value Taken Time  BP 189/89 10/11/23 1437  Temp    Pulse 73 10/11/23 1439  Resp 11 10/11/23 1439  SpO2 100 % 10/11/23 1439  Vitals shown include unfiled device data.  Last Pain:  Vitals:   10/11/23 0852  TempSrc:   PainSc: 0-No pain      Patients Stated Pain Goal: 0 (10/11/23 0025)  Complications: No notable events documented.

## 2023-10-11 NOTE — Op Note (Signed)
10/11/2023  2:29 PM  PATIENT:  Jonathan Lane  82 y.o. male  PRE-OPERATIVE DIAGNOSIS:  CHOLECYSTITIS, choledocholithiasis  POST-OPERATIVE DIAGNOSIS:  CHOLECYSTITIS  PROCEDURE:  Procedure(s): LAPAROSCOPIC CHOLECYSTECTOMY (N/A)  SURGEON:  Surgeons and Role:    * Axel Filler, MD - Primary  ANESTHESIA:   local and general  EBL:  25 mL   BLOOD ADMINISTERED:none  DRAINS: none   LOCAL MEDICATIONS USED:  BUPIVICAINE   SPECIMEN:  Source of Specimen:  gallbladder  DISPOSITION OF SPECIMEN:  PATHOLOGY  COUNTS:  YES  TOURNIQUET:  * No tourniquets in log *  DICTATION: .Dragon Dictation  The patient was taken to the operating and placed in the supine position with bilateral SCDs in place.  The patient was prepped and draped in the usual sterile fashion. A time out was called and all facts were verified. A pneumoperitoneum was obtained via A Veress needle technique to a pressure of 14mm of mercury.  A 5mm trochar was then placed in the right upper quadrant under visualization, and there were no injuries to any abdominal organs. A 11 mm port was then placed in the umbilical region after infiltrating with local anesthesia under direct visualization. A second and third epigastric port and right lower quadrant port placement under direct visualization, respectively.    The gallbladder was identified and retracted, the peritoneum was then sharply dissected from the gallbladder and this dissection was carried down to Calot's triangle. The cystic duct was identified and stripped away circumferentially and seen going into the gallbladder 360, the critical angle was obtained.  2 clips were placed proximally one distally and the cystic duct transected. The cystic artery was identified and 2 clips placed proximally and one distally and transected.  We then proceeded to remove the gallbladder off the hepatic fossa with Bovie cautery. A retrieval bag was then placed in the abdomen and gallbladder  placed in the bag. The hepatic fossa was then reexamined and hemostasis was achieved with Bovie cautery and was excellent at the end of the case.   The subhepatic fossa and perihepatic fossa was then irrigated until the effluent was clear.  The gallbladder and bag were removed from the abdominal cavity. The 11 mm trocar fascia was reapproximated with the Endo Close #1 Vicryl x2.  The pneumoperitoneum was evacuated and all trochars removed under direct visulalization.  The skin was then closed with 4-0 Monocryl and the skin dressed with Dermabond.    The patient was awaken from general anesthesia and taken to the recovery room in stable condition.   PLAN OF CARE: Admit to inpatient   PATIENT DISPOSITION:  PACU - hemodynamically stable.   Delay start of Pharmacological VTE agent (>24hrs) due to surgical blood loss or risk of bleeding: not applicable

## 2023-10-11 NOTE — Anesthesia Procedure Notes (Signed)
Procedure Name: Intubation Date/Time: 10/11/2023 1:49 PM  Performed by: Ahmed Prima, CRNAPre-anesthesia Checklist: Patient identified, Emergency Drugs available, Suction available and Patient being monitored Patient Re-evaluated:Patient Re-evaluated prior to induction Oxygen Delivery Method: Circle system utilized Preoxygenation: Pre-oxygenation with 100% oxygen Induction Type: IV induction Ventilation: Mask ventilation without difficulty and Oral airway inserted - appropriate to patient size Laryngoscope Size: Hyacinth Meeker and 3 Grade View: Grade I Tube type: Oral Tube size: 7.5 mm Number of attempts: 1 Airway Equipment and Method: Stylet, Oral airway and Patient positioned with wedge pillow Placement Confirmation: ETT inserted through vocal cords under direct vision, positive ETCO2 and breath sounds checked- equal and bilateral Secured at: 24 (at the lip) cm Tube secured with: Tape Dental Injury: Injury to lip  Comments: Pt.'s upper lip nicked by OA upon insertion.

## 2023-10-11 NOTE — Anesthesia Preprocedure Evaluation (Addendum)
Anesthesia Evaluation  Patient identified by MRN, date of birth, ID band Patient awake    Reviewed: Allergy & Precautions, NPO status , Patient's Chart, lab work & pertinent test results  History of Anesthesia Complications Negative for: history of anesthetic complications  Airway Mallampati: II  TM Distance: >3 FB Neck ROM: Full    Dental  (+) Dental Advisory Given, Teeth Intact   Pulmonary sleep apnea and Continuous Positive Airway Pressure Ventilation , former smoker   Pulmonary exam normal        Cardiovascular hypertension, Pt. on medications Normal cardiovascular exam+ dysrhythmias Atrial Fibrillation    '24 TTE - EF 60 to 65%. The right ventricular size is moderately enlarged. Trivial mitral valve regurgitation.     Neuro/Psych TIA negative psych ROS   GI/Hepatic Neg liver ROS,GERD  Medicated and Controlled,,  Endo/Other  negative endocrine ROS    Renal/GU negative Renal ROS     Musculoskeletal negative musculoskeletal ROS (+)    Abdominal   Peds  Hematology  (+) Blood dyscrasia, anemia  On plavix    Anesthesia Other Findings   Reproductive/Obstetrics                              Anesthesia Physical Anesthesia Plan  ASA: 3  Anesthesia Plan: General   Post-op Pain Management:    Induction: Intravenous  PONV Risk Score and Plan: 2 and Treatment may vary due to age or medical condition, Ondansetron and Propofol infusion  Airway Management Planned: Oral ETT  Additional Equipment: None  Intra-op Plan:   Post-operative Plan: Extubation in OR  Informed Consent: I have reviewed the patients History and Physical, chart, labs and discussed the procedure including the risks, benefits and alternatives for the proposed anesthesia with the patient or authorized representative who has indicated his/her understanding and acceptance.     Dental advisory given  Plan Discussed  with: CRNA and Anesthesiologist  Anesthesia Plan Comments:          Anesthesia Quick Evaluation

## 2023-10-11 NOTE — Progress Notes (Signed)
Report given to 3W RN

## 2023-10-11 NOTE — Progress Notes (Signed)
Progress Note   Patient: Jonathan Lane MWN:[id number] DOB: February 09, 1941 DOA: 10/05/2023     5 DOS: the patient was seen and examined on 10/11/2023 at 11:22AM      Brief hospital course: Mr. Pennino is an 82 y.o. 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  Started on antibiotics.  GI and surgery consulted. 10/23, underwent ERCP.  Plan for lap chole today.  Assessment and Plan: Acute Cholangitis Acute biliary pancreatitis Admitted and general surgery and gastroenterology consulted.  MRCP confirmed CBD stone.  Continue IV fluids.  Continue IV Zosyn.  Status post ERCP with sphincterotomy and removal of debris.  Clinically improving.  For lap chole today.  Anticipate home tomorrow.  Hypokalemia - Supplement K.  Adequate.  OSA on CPAP - CPAP at night  Essential hypertension BP 150s. - Continue HCTZ, diltiazem -   Cerebrovascular disease Atrial fibrillation ruled out.  As part of workup for his previous TIA, he has had monitoring without signs of atrial fibrillation.  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 - Continue Lipitor - Hold Plavix until surgery.          Subjective: Patient seen and examined.  Occasional mild right upper quadrant pain but denies any other complaints.  Denies any nausea vomiting.  Looking forward for cholecystectomy.  Physical Exam: BP (!) 143/74 (BP Location: Left Arm)   Pulse (!) 53   Temp (!) 97.4 F (36.3 C) (Oral)   Resp 17   Ht 6\' 1"  (1.854 m)   Wt 88 kg   SpO2 96%   BMI 25.60 kg/m    General: Looks fairly comfortable.  Not in any distress.  On room air.  Pleasant interaction. Cardiovascular: S1-S2 normal Respiratory: Bilateral clear Gastrointestinal: Soft and tender.  Bowel sound present. Ext: No  swelling or edema.  No cyanosis.     Data Reviewed: GI Labs reviewed.  Family Communication: None at the bedside.    Disposition: Status is: Inpatient Remains inpatient.  For surgery today.        Author: Dorcas Carrow, MD 10/11/2023 9:50 AM  For on call review [email or web address].

## 2023-10-11 NOTE — Progress Notes (Signed)
Pt will self administer his CPAP when he is ready for bed.

## 2023-10-11 NOTE — Progress Notes (Addendum)
Progress Note  Primary GI: Dr. Russella Dar  LOS: 5 days   Chief Complaint:Cholangitis   Subjective   Patient lying in bed, denies nausea, vomiting, or abdominal pain. No fever or chills. Tolerated liquid diet. No SOB, CP, or dizziness. Reports he is having surgery today at 1130, no complaints at this time.  No family was present at the time of my evaluation.   Objective   Vital signs in last 24 hours: Temp:  [97.4 F (36.3 C)-97.6 F (36.4 C)] 97.4 F (36.3 C) (10/24 2956) Pulse Rate:  [51-66] 53 (10/24 0633) Resp:  [16-18] 17 (10/24 2130) BP: (116-143)/(55-90) 143/74 (10/24 0633) SpO2:  [93 %-97 %] 96 % (10/24 0633) FiO2 (%):  [21 %] 21 % (10/23 2138) Last BM Date : 10/09/23 Last BM recorded by nurses in past 5 days No data recorded  General:   male in no acute distress  Heart:  Regular rate and rhythm; no murmurs Pulm: Clear anteriorly; no wheezing Abdomen: soft, nondistended, normal bowel sounds in all quadrants. Nontender without guarding. No organomegaly appreciated. Extremities:  No edema Neurologic:  Alert and  oriented x4;  No focal deficits.  Psych:  Cooperative. Normal mood and affect.  Intake/Output from previous day: 10/23 0701 - 10/24 0700 In: 894.6 [P.O.:360; I.V.:400; IV Piggyback:134.6] Out: 0  Intake/Output this shift: Total I/O In: 29.4 [IV Piggyback:29.4] Out: -   Studies/Results: DG ERCP  Result Date: 10/10/2023 CLINICAL DATA:  Choledocholithiasis EXAM: ERCP TECHNIQUE: Multiple spot images obtained with the fluoroscopic device and submitted for interpretation post-procedure. FLUOROSCOPY: Radiation Exposure Index (as provided by the fluoroscopic device): 27.33 mGy Kerma COMPARISON:  Prior MRCP 10/06/2023 FINDINGS: A total of 10 intraoperative spot images are submitted for review. The images demonstrate a flexible duodenal scope in the descending duodenum with wire cannulation of the common hepatic duct. Subsequent cholangiography demonstrates filling  defects in the distal common bile duct consistent with choledocholithiasis. Subsequent images confirm sphincterotomy and balloon sweeping of the common duct. IMPRESSION: 1. Choledocholithiasis. 2. ERCP with sphincterotomy and balloon sweeping of the common duct. These images were submitted for radiologic interpretation only. Please see the procedural report for the amount of contrast and the fluoroscopy time utilized. Electronically Signed   By: Malachy Moan M.D.   On: 10/10/2023 13:12    Lab Results: Recent Labs    10/09/23 0326  WBC 6.7  HGB 11.9*  HCT 36.7*  PLT 215   BMET Recent Labs    10/09/23 0326 10/10/23 0318  NA 136 132*  K 3.3* 4.0  CL 101 99  CO2 25 23  GLUCOSE 107* 107*  BUN 9 13  CREATININE 0.64 0.80  CALCIUM 8.8* 8.6*   LFT Recent Labs    10/10/23 0318  PROT 6.9  ALBUMIN 2.9*  AST 193*  ALT 284*  ALKPHOS 226*  BILITOT 3.3*   PT/INR No results for input(s): "LABPROT", "INR" in the last 72 hours.   Scheduled Meds:  atorvastatin  20 mg Oral QHS   bisacodyl  10 mg Oral Once   diltiazem  240 mg Oral Daily   Dorzolamide HCl-Timolol Mal PF  1 drop Both Eyes BID   hydrochlorothiazide  25 mg Oral Daily   melatonin  6 mg Oral QHS   Netarsudil Dimesylate  1 drop Both Eyes QHS   pantoprazole  40 mg Oral Daily   polyethylene glycol  17 g Oral Daily   tadalafil  5 mg Oral Daily   Continuous Infusions:  piperacillin-tazobactam 3.375  g (10/11/23 0426)      Patient Narrative:  Patient presented originally on 10/19 with right sided abdominal pain, nausea, and vomiting, found to have elevated LFTs as well as elevated lipase. CT and right upper quadrant ultrasound originally only showed cholelithiasis. Follow-up MRCP showed that he does have choledocholithiasis with mild dilatation of the common bile duct as well as changes that are consistent with cholangitis.    Impression/Plan:  A 82 y.o. male with history of DVT, GERD, OSA, TIA on Plavix, ureteral  cancer s/p surgery presented with abdominal pain as well as nausea and vomiting, found to have cholangitis and gallstone pancreatitis.   Acute gallstone pancreatitis, choledocholithiasis  Potassium 4 / BUN 13 /Creat 0.08 T. Bili  6.1>3.3/ Alk phosp  232>226 AST  275>193    ALT   301>601 10/23 ERCP revealed gastritis. A biliary sphincterotomy was performed and the biliary tree was swept and sludge was found.  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

## 2023-10-11 NOTE — Plan of Care (Signed)

## 2023-10-11 NOTE — Progress Notes (Signed)
Day of Surgery   Subjective/Chief Complaint: Pt doing well    Objective: Vital signs in last 24 hours: Temp:  [97.4 F (36.3 C)-97.6 F (36.4 C)] 97.4 F (36.3 C) (10/24 6440) Pulse Rate:  [51-57] 53 (10/24 0633) Resp:  [16-17] 17 (10/24 3474) BP: (116-143)/(55-90) 143/74 (10/24 0633) SpO2:  [93 %-97 %] 96 % (10/24 0633) FiO2 (%):  [21 %] 21 % (10/23 2138) Weight:  [88 kg] 88 kg (10/24 1151) Last BM Date : 10/09/23  Intake/Output from previous day: 10/23 0701 - 10/24 0700 In: 894.6 [P.O.:360; I.V.:400; IV Piggyback:134.6] Out: 0  Intake/Output this shift: Total I/O In: 29.4 [IV Piggyback:29.4] Out: -   PE:  Constitutional: No acute distress, conversant, appears states age. Eyes: Anicteric sclerae, moist conjunctiva, no lid lag Lungs: Clear to auscultation bilaterally, normal respiratory effort CV: regular rate and rhythm, no murmurs, no peripheral edema, pedal pulses 2+ GI: Soft, no masses or hepatosplenomegaly, non-tender to palpation Skin: No rashes, palpation reveals normal turgor Psychiatric: appropriate judgment and insight, oriented to person, place, and time   Lab Results:  Recent Labs    10/09/23 0326  WBC 6.7  HGB 11.9*  HCT 36.7*  PLT 215   BMET Recent Labs    10/09/23 0326 10/10/23 0318  NA 136 132*  K 3.3* 4.0  CL 101 99  CO2 25 23  GLUCOSE 107* 107*  BUN 9 13  CREATININE 0.64 0.80  CALCIUM 8.8* 8.6*   PT/INR No results for input(s): "LABPROT", "INR" in the last 72 hours. ABG No results for input(s): "PHART", "HCO3" in the last 72 hours.  Invalid input(s): "PCO2", "PO2"  Studies/Results: DG ERCP  Result Date: 10/10/2023 CLINICAL DATA:  Choledocholithiasis EXAM: ERCP TECHNIQUE: Multiple spot images obtained with the fluoroscopic device and submitted for interpretation post-procedure. FLUOROSCOPY: Radiation Exposure Index (as provided by the fluoroscopic device): 27.33 mGy Kerma COMPARISON:  Prior MRCP 10/06/2023 FINDINGS: A total  of 10 intraoperative spot images are submitted for review. The images demonstrate a flexible duodenal scope in the descending duodenum with wire cannulation of the common hepatic duct. Subsequent cholangiography demonstrates filling defects in the distal common bile duct consistent with choledocholithiasis. Subsequent images confirm sphincterotomy and balloon sweeping of the common duct. IMPRESSION: 1. Choledocholithiasis. 2. ERCP with sphincterotomy and balloon sweeping of the common duct. These images were submitted for radiologic interpretation only. Please see the procedural report for the amount of contrast and the fluoroscopy time utilized. Electronically Signed   By: Malachy Moan M.D.   On: 10/10/2023 13:12    Anti-infectives: Anti-infectives (From admission, onward)    Start     Dose/Rate Route Frequency Ordered Stop   10/06/23 0400  [MAR Hold]  piperacillin-tazobactam (ZOSYN) IVPB 3.375 g        (MAR Hold since Thu 10/11/2023 at 1140.Hold Reason: Transfer to a Procedural area)  Placed in "Followed by" Linked Group   3.375 g 12.5 mL/hr over 240 Minutes Intravenous Every 8 hours 10/05/23 2027     10/05/23 2030  piperacillin-tazobactam (ZOSYN) IVPB 3.375 g       Placed in "Followed by" Linked Group   3.375 g 100 mL/hr over 30 Minutes Intravenous  Once 10/05/23 2027 10/05/23 2125       Assessment/Plan: BIliary pancreatitis Choledocholithiasis   -OR today for lap chole All risks and benefits were discussed with the patient to generally include: infection, bleeding, possible need for post op ERCP, damage to the bile ducts, and bile leak. Alternatives were offered and  described.  All questions were answered and the patient voiced understanding of the procedure and wishes to proceed at this point with a laparoscopic cholecystectomy   LOS: 5 days    Axel Filler 10/11/2023

## 2023-10-12 ENCOUNTER — Encounter: Payer: Self-pay | Admitting: Gastroenterology

## 2023-10-12 ENCOUNTER — Other Ambulatory Visit: Payer: Self-pay

## 2023-10-12 ENCOUNTER — Encounter (HOSPITAL_COMMUNITY): Payer: Self-pay | Admitting: General Surgery

## 2023-10-12 DIAGNOSIS — K8309 Other cholangitis: Secondary | ICD-10-CM | POA: Diagnosis not present

## 2023-10-12 NOTE — Anesthesia Postprocedure Evaluation (Signed)
Anesthesia Post Note  Patient: Jonathan Lane  Procedure(s) Performed: LAPAROSCOPIC CHOLECYSTECTOMY     Patient location during evaluation: PACU Anesthesia Type: General Level of consciousness: awake and alert Pain management: pain level controlled Vital Signs Assessment: post-procedure vital signs reviewed and stable Respiratory status: spontaneous breathing, nonlabored ventilation, respiratory function stable and patient connected to nasal cannula oxygen Cardiovascular status: blood pressure returned to baseline and stable Postop Assessment: no apparent nausea or vomiting Anesthetic complications: no   No notable events documented.  Last Vitals:  Vitals:   10/12/23 0501 10/12/23 1022  BP: 131/68 120/66  Pulse: (!) 51 (!) 58  Resp: 15 16  Temp: 36.4 C 36.6 C  SpO2: 95% 97%    Last Pain:  Vitals:   10/12/23 0916  TempSrc:   PainSc: 0-No pain   Pain Goal: Patients Stated Pain Goal: 0 (10/12/23 0515)                 Beryle Lathe

## 2023-10-12 NOTE — Discharge Summary (Signed)
Physician Discharge Summary  Jonathan Lane:[id number] DOB: 1941-05-30 DOA: 10/05/2023  PCP: Shon Hale, MD  Admit date: 10/05/2023 Discharge date: 10/12/2023  Admitted From: Home Disposition: Home  Recommendations for Outpatient Follow-up:  Follow up with PCP in 1-2 weeks Please obtain CMP/CBC in one week   Discharge Condition: Stable CODE STATUS: Full code Diet recommendation: Low-salt diet  Discharge summary: Jonathan Lane is an 82 y.o. 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  Started on antibiotics.  GI and surgery consulted. 10/23, underwent ERCP.   10/24, underwent lap chole.   Postoperatively improving.  Antibiotics discontinued.  Currently with normal bowel function.  Going home.  Biopsies are pending. Chronic medical issues including sleep apnea, hypertension remained stable. Patient is no longer on apixaban.  He can continue Plavix as per surgical recommendation.  Discharge Diagnoses:  Principal Problem:   Cholangitis Active Problems:   Acute biliary pancreatitis   Cerebrovascular disease   Essential hypertension   OSA on CPAP   Hypokalemia   Gastritis and gastroduodenitis   Abnormal LFTs   Biliary sludge   Ascending cholangitis    Discharge Instructions  Discharge Instructions     Diet - low sodium heart healthy   Complete by: As directed    Increase activity slowly   Complete by: As directed       Allergies as of 10/12/2023       Reactions   Brimonidine Tartrate-timolol Itching, Rash, Other (See Comments)   Itching eyes Other reaction(s): Not available   Brimonidine Tartrate Other (See Comments)   Irritates the eyes and increased pressure   Tetracycline Hcl Other (See Comments)   Doesn't remember reaction-"like 55 years ago"   Ciprofloxacin Other (See Comments)    Constipation        Medication List     STOP taking these medications    apixaban 5 MG Tabs tablet Commonly known as: Eliquis       TAKE these medications    acetaminophen 500 MG tablet Commonly known as: TYLENOL Take 500 mg by mouth at bedtime.   ADULT GUMMY PO Take 2 capsules by mouth daily.   PreserVision AREDS 2+Multi Vit Caps Take 1 capsule by mouth 2 times daily at 12 noon and 4 pm.   ALIGN PO Take 1 capsule by mouth in the morning.   atorvastatin 20 MG tablet Commonly known as: LIPITOR Take 1 tablet (20 mg total) by mouth daily. What changed: when to take this   Cholecalciferol 25 MCG (1000 UT) capsule Take 1,000 Units by mouth daily.   clopidogrel 75 MG tablet Commonly known as: PLAVIX Take 1 tablet by mouth daily.   Coenzyme Q-10 200 MG Caps Take 1 capsule by mouth daily.   Cosopt PF 2-0.5 % Soln Generic drug: Dorzolamide HCl-Timolol Mal PF Place 1 drop into both eyes in the morning and at bedtime.   CRANBERRY PO Take 1 tablet by mouth daily.   cyanocobalamin 1000 MCG tablet Commonly known as: VITAMIN B12 Take 500 mcg by mouth daily.   diltiazem 240 MG 24 hr capsule Commonly known as: Dilt-XR Take 1 capsule (240 mg total) by mouth daily. Please call our office to schedule an yearly appointment with Dr. Eden Emms for December 2024 before anymore refills. [phone]. Thank you 1st attempt   docusate sodium 100 MG capsule Commonly known as: COLACE Take  1 capsule (100 mg total) by mouth 2 (two) times daily.   esomeprazole 20 MG capsule Commonly known as: NEXIUM Take 20 mg by mouth 2 (two) times daily before a meal.   Fish Oil 1200 MG Caps Take 1 capsule by mouth 2 (two) times daily.   fluticasone 50 MCG/ACT nasal spray Commonly known as: FLONASE Place 2 sprays into both nostrils daily as needed for allergies or rhinitis.   losartan-hydrochlorothiazide 100-25 MG tablet Commonly known as: HYZAAR Take 1 tablet by mouth daily.    Melatonin 3 MG Caps Take 2 capsules by mouth at bedtime. 6 mg nightly   nitroGLYCERIN 0.4 MG SL tablet Commonly known as: NITROSTAT Place 1 tablet (0.4 mg total) under the tongue every 5 (five) minutes as needed for chest pain.   NON FORMULARY Pt uses a cpap nightly   OVER THE COUNTER MEDICATION Take 1 tablet by mouth daily. Focus Factor   OVER THE COUNTER MEDICATION Take 1 capsule by mouth daily. Saffron 28 mg   Prevagen Extra Strength 20 MG Caps Generic drug: Apoaequorin Take 1 capsule by mouth daily.   Prostate Health Caps Take 1 capsule by mouth daily.   Rhopressa 0.02 % Soln Generic drug: Netarsudil Dimesylate Place 1 drop into both eyes at bedtime.   tadalafil 5 MG tablet Commonly known as: CIALIS Take 5 mg by mouth daily.   tamsulosin 0.4 MG Caps capsule Commonly known as: FLOMAX Take 1 capsule (0.4 mg total) by mouth daily.   valACYclovir 1000 MG tablet Commonly known as: VALTREX Take 1,000 mg by mouth 2 (two) times daily as needed (for outbreaks).   Zinc 25 MG Tabs Take 1 tablet by mouth daily.        Follow-up Information     Axel Filler, MD Follow up on 10/29/2023.   Specialty: General Surgery Why: 9:00am, Arrive 30 minutes prior to your appointment time, Please bring your insurance card and photo ID Contact information: [address] [place] [address] [phone]                Allergies  Allergen Reactions   Brimonidine Tartrate-Timolol Itching, Rash and Other (See Comments)    Itching eyes Other reaction(s): Not available   Brimonidine Tartrate Other (See Comments)    Irritates the eyes and increased pressure   Tetracycline Hcl Other (See Comments)    Doesn't remember reaction-"like 55 years ago"    Ciprofloxacin Other (See Comments)    Constipation    Consultations: GI Surgery   Procedures/Studies: DG ERCP  Result Date: 10/10/2023 CLINICAL DATA:  Choledocholithiasis EXAM: ERCP TECHNIQUE:  Multiple spot images obtained with the fluoroscopic device and submitted for interpretation post-procedure. FLUOROSCOPY: Radiation Exposure Index (as provided by the fluoroscopic device): 27.33 mGy Kerma COMPARISON:  Prior MRCP 10/06/2023 FINDINGS: A total of 10 intraoperative spot images are submitted for review. The images demonstrate a flexible duodenal scope in the descending duodenum with wire cannulation of the common hepatic duct. Subsequent cholangiography demonstrates filling defects in the distal common bile duct consistent with choledocholithiasis. Subsequent images confirm sphincterotomy and balloon sweeping of the common duct. IMPRESSION: 1. Choledocholithiasis. 2. ERCP with sphincterotomy and balloon sweeping of the common duct. These images were submitted for radiologic interpretation only. Please see the procedural report for the amount of contrast and the fluoroscopy time utilized. Electronically Signed   By: Malachy Moan M.D.   On: 10/10/2023 13:12   MR ABDOMEN MRCP W WO CONTAST  Result Date: 10/06/2023 CLINICAL  DATA:  82 year old male with history of bladder cancer presenting with signs and symptoms concerning for potential ascending cholangitis. EXAM: MRI ABDOMEN WITHOUT AND WITH CONTRAST (INCLUDING MRCP) TECHNIQUE: Multiplanar multisequence MR imaging of the abdomen was performed both before and after the administration of intravenous contrast. Heavily T2-weighted images of the biliary and pancreatic ducts were obtained, and three-dimensional MRCP images were rendered by post processing. CONTRAST:  10mL GADAVIST GADOBUTROL 1 MMOL/ML IV SOLN COMPARISON:  No prior abdominal MRI. CT of the abdomen and pelvis 10/05/2023. Abdominal ultrasound 10/05/2023. FINDINGS: Lower chest: Unremarkable. Hepatobiliary: No suspicious cystic or solid hepatic lesions. However, there is increased T2 signal intensity throughout the liver in a periportal distribution, which also demonstrates increased  enhancement during early post gadolinium imaging, concerning for cholangitis. Filling defect lying dependently in the gallbladder measuring 1.4 cm in length compatible with a gallstone. Gallbladder is moderately distended. Gallbladder wall appears minimally thickened and edematous, although there is no frank pericholecystic fluid or overt surrounding inflammatory changes. MRCP images demonstrates some mild dilatation of the common bile duct which measures 9 mm. In the distal common bile duct there is at least 1 filling defect measuring up to 6 mm, indicative of choledocholithiasis. No intrahepatic biliary ductal dilatation noted at this time. Pancreas: No pancreatic mass. No pancreatic ductal dilatation noted on MRCP images. No pancreatic or peripancreatic fluid collections or inflammatory changes. Spleen:  Unremarkable. Adrenals/Urinary Tract: 1.3 cm T1 hypointense, T2 hyperintense, nonenhancing lesion in the interpolar region of the right kidney is compatible with a simple cyst (Bosniak class 1), no imaging follow-up recommended. Left kidney and bilateral adrenal glands are otherwise normal in appearance. No hydroureteronephrosis in the visualized portions of the abdomen. Stomach/Bowel: Visualized portions are unremarkable. Vascular/Lymphatic: Aortic atherosclerosis. No aneurysm identified in the visualized abdominal vasculature. No lymphadenopathy noted in the abdomen. Other: No significant volume of ascites noted in the visualized portions of the peritoneal cavity. Musculoskeletal: No aggressive appearing osseous lesions are noted in the visualized portions of the skeleton. IMPRESSION: 1. Today's study is positive for choledocholithiasis. This appears mildly obstructive resulting in mild dilatation of the common bile duct. No frank intrahepatic biliary ductal dilatation noted at this time. 2. Increased T2 signal intensity associated with the portal triads, which also demonstrate increased enhancement on early  post gadolinium imaging; imaging findings compatible with reported clinical history of cholangitis. 3. Cholelithiasis. Gallbladder is moderately distended with mild gallbladder wall thickening and edema, but no frank pericholecystic fluid to clearly indicate an acute cholecystitis. 4. Additional incidental findings, as above. Electronically Signed   By: Trudie Reed M.D.   On: 10/06/2023 10:55   MR 3D Recon At Scanner  Result Date: 10/06/2023 CLINICAL DATA:  82 year old male with history of bladder cancer presenting with signs and symptoms concerning for potential ascending cholangitis. EXAM: MRI ABDOMEN WITHOUT AND WITH CONTRAST (INCLUDING MRCP) TECHNIQUE: Multiplanar multisequence MR imaging of the abdomen was performed both before and after the administration of intravenous contrast. Heavily T2-weighted images of the biliary and pancreatic ducts were obtained, and three-dimensional MRCP images were rendered by post processing. CONTRAST:  10mL GADAVIST GADOBUTROL 1 MMOL/ML IV SOLN COMPARISON:  No prior abdominal MRI. CT of the abdomen and pelvis 10/05/2023. Abdominal ultrasound 10/05/2023. FINDINGS: Lower chest: Unremarkable. Hepatobiliary: No suspicious cystic or solid hepatic lesions. However, there is increased T2 signal intensity throughout the liver in a periportal distribution, which also demonstrates increased enhancement during early post gadolinium imaging, concerning for cholangitis. Filling defect lying dependently in the gallbladder measuring 1.4  cm in length compatible with a gallstone. Gallbladder is moderately distended. Gallbladder wall appears minimally thickened and edematous, although there is no frank pericholecystic fluid or overt surrounding inflammatory changes. MRCP images demonstrates some mild dilatation of the common bile duct which measures 9 mm. In the distal common bile duct there is at least 1 filling defect measuring up to 6 mm, indicative of choledocholithiasis. No  intrahepatic biliary ductal dilatation noted at this time. Pancreas: No pancreatic mass. No pancreatic ductal dilatation noted on MRCP images. No pancreatic or peripancreatic fluid collections or inflammatory changes. Spleen:  Unremarkable. Adrenals/Urinary Tract: 1.3 cm T1 hypointense, T2 hyperintense, nonenhancing lesion in the interpolar region of the right kidney is compatible with a simple cyst (Bosniak class 1), no imaging follow-up recommended. Left kidney and bilateral adrenal glands are otherwise normal in appearance. No hydroureteronephrosis in the visualized portions of the abdomen. Stomach/Bowel: Visualized portions are unremarkable. Vascular/Lymphatic: Aortic atherosclerosis. No aneurysm identified in the visualized abdominal vasculature. No lymphadenopathy noted in the abdomen. Other: No significant volume of ascites noted in the visualized portions of the peritoneal cavity. Musculoskeletal: No aggressive appearing osseous lesions are noted in the visualized portions of the skeleton. IMPRESSION: 1. Today's study is positive for choledocholithiasis. This appears mildly obstructive resulting in mild dilatation of the common bile duct. No frank intrahepatic biliary ductal dilatation noted at this time. 2. Increased T2 signal intensity associated with the portal triads, which also demonstrate increased enhancement on early post gadolinium imaging; imaging findings compatible with reported clinical history of cholangitis. 3. Cholelithiasis. Gallbladder is moderately distended with mild gallbladder wall thickening and edema, but no frank pericholecystic fluid to clearly indicate an acute cholecystitis. 4. Additional incidental findings, as above. Electronically Signed   By: Trudie Reed M.D.   On: 10/06/2023 10:55   US Abdomen Limited RUQ (LIVER/GB)  Result Date: 10/05/2023 CLINICAL DATA:  Abdominal pain EXAM: ULTRASOUND ABDOMEN LIMITED RIGHT UPPER QUADRANT COMPARISON:  CT from earlier in the same  day. FINDINGS: Gallbladder: Cholelithiasis is again identified. No wall thickening or pericholecystic fluid is noted. Negative sonographic Murphy's sign is noted. Common bile duct: Diameter: 3 mm Liver: No focal lesion identified. Within normal limits in parenchymal echogenicity. Portal vein is patent on color Doppler imaging with normal direction of blood flow towards the liver. Other: None. IMPRESSION: Cholelithiasis without complicating factors. Electronically Signed   By: Alcide Clever M.D.   On: 10/05/2023 21:31   CT ABDOMEN PELVIS W CONTRAST  Result Date: 10/05/2023 CLINICAL DATA:  Right lower quadrant pain.  Fever. EXAM: CT ABDOMEN AND PELVIS WITH CONTRAST TECHNIQUE: Multidetector CT imaging of the abdomen and pelvis was performed using the standard protocol following bolus administration of intravenous contrast. RADIATION DOSE REDUCTION: This exam was performed according to the departmental dose-optimization program which includes automated exposure control, adjustment of the mA and/or kV according to patient size and/or use of iterative reconstruction technique. CONTRAST:  OMNIPAQUE IOHEXOL 300 MG/ML  SOLN COMPARISON:  02/19/2023 FINDINGS: Lower Chest: No acute findings. Hepatobiliary: No suspicious hepatic masses identified. Gallstones are seen, however there is no evidence of cholecystitis or biliary dilatation. Pancreas:  No mass or inflammatory changes. Spleen: Within normal limits in size and appearance. Adrenals/Urinary Tract: No suspicious masses identified. 4 mm calculus again seen in lower pole of left kidney. No evidence of ureteral calculi or hydronephrosis. Unremarkable unopacified urinary bladder. Stomach/Bowel: No evidence of obstruction, inflammatory process or abnormal fluid collections. Diverticulosis is seen mainly involving the sigmoid colon, however there is no  evidence of diverticulitis. Although the appendix is not directly visualized, no inflammatory process seen in region  of the cecum or elsewhere. Vascular/Lymphatic: Stable shotty sub-centimeter retroperitoneal lymph nodes. No pathologically enlarged lymph nodes. No acute vascular findings. Reproductive:  Stable mildly enlarged prostate. Other:  None. Musculoskeletal:  No suspicious bone lesions identified. IMPRESSION: No evidence of appendicitis or other acute findings. Cholelithiasis. No radiographic evidence of cholecystitis. Colonic diverticulosis, without radiographic evidence of diverticulitis. Stable mildly enlarged prostate. Small left renal calculus. No evidence of ureteral calculi or hydronephrosis. Electronically Signed   By: Danae Orleans M.D.   On: 10/05/2023 20:13   (Echo, Carotid, EGD, Colonoscopy, ERCP)    Subjective: Patient seen in the morning rounds.  Denies any complaints.  He is eating regular diet.  No bowel movements yet.  Walking around in the hallway.  Does not need any pain medications as per the patient.   Discharge Exam: Vitals:   10/12/23 0501 10/12/23 1022  BP: 131/68 120/66  Pulse: (!) 51 (!) 58  Resp: 15 16  Temp: 97.6 F (36.4 C) 97.8 F (36.6 C)  SpO2: 95% 97%   Vitals:   10/11/23 2105 10/12/23 0104 10/12/23 0501 10/12/23 1022  BP: (!) 145/88 139/63 131/68 120/66  Pulse: 62 (!) 56 (!) 51 (!) 58  Resp: 15 15 15 16   Temp: (!) 97.5 F (36.4 C) (!) 97.5 F (36.4 C) 97.6 F (36.4 C) 97.8 F (36.6 C)  TempSrc: Oral Oral Oral   SpO2: 96% 95% 95% 97%  Weight:      Height:        General: Pt is alert, awake, not in acute distress Cardiovascular: RRR, S1/S2 +, no rubs, no gallops Respiratory: CTA bilaterally, no wheezing, no rhonchi Abdominal: Soft, NT, ND, bowel sounds +, ports are clean and dry. Extremities: no edema, no cyanosis    The results of significant diagnostics from this hospitalization (including imaging, microbiology, ancillary and laboratory) are listed below for reference.     Microbiology: Recent Results (from the past 240 hour(s))  Blood culture  (routine x 2)     Status: None   Collection Time: 10/05/23  5:50 PM   Specimen: BLOOD  Result Value Ref Range Status   Specimen Description   Final    BLOOD LEFT ANTECUBITAL Performed at [facility], [address], [place], [place] [address]    Special Requests   Final    BOTTLES DRAWN AEROBIC AND ANAEROBIC Blood Culture results may not be optimal due to an excessive volume of blood received in culture bottles Performed at [facility], [address] [place], [place] [address]    Culture   Final    NO GROWTH 5 DAYS Performed at [facility], [address] [place], [place] [address]    Report Status 10/11/2023 FINAL  Final  Blood culture (routine x 2)     Status: None   Collection Time: 10/05/23  8:46 PM   Specimen: BLOOD  Result Value Ref Range Status   Specimen Description   Final    BLOOD LEFT ANTECUBITAL Performed at [facility], [address] [place], [place] [address]    Special Requests   Final    BOTTLES DRAWN AEROBIC AND ANAEROBIC Blood Culture results may not be optimal due to an excessive volume of blood received in culture bottles Performed at [facility], [address], [place], [place] [address]    Culture   Final    NO  GROWTH 5 DAYS Performed at [facility], [address] [place], [place] [address]    Report Status 10/11/2023 FINAL  Final     Labs: BNP (last 3 results) No results for input(s): "BNP" in the last 8760 hours. Basic Metabolic Panel: Recent Labs  Lab 10/06/23 0317 10/07/23 0324 10/08/23 0319 10/09/23 0326 10/10/23 0318  NA 134* 134* 134* 136 132*  K 3.4* 3.6 3.2* 3.3* 4.0  CL 97* 101 98 101 99  CO2 28 25 26 25 23   GLUCOSE 113* 115* 112* 107* 107*  BUN 14 15 13 9 13   CREATININE 0.73 0.72 0.85 0.64 0.80  CALCIUM 9.0 8.3* 8.3* 8.8* 8.6*   Liver Function Tests: Recent Labs  Lab 10/06/23 0317 10/07/23 0324 10/08/23 0319  10/09/23 0326 10/10/23 0318  AST 399* 202* 127* 275* 193*  ALT 456* 301* 242* 318* 284*  ALKPHOS 239* 209* 206* 232* 226*  BILITOT 4.8* 5.0* 2.4* 6.1* 3.3*  PROT 6.6 6.1* 6.3* 6.9 6.9  ALBUMIN 3.3* 2.8* 3.0* 2.9* 2.9*   Recent Labs  Lab 10/05/23 1742 10/06/23 0317 10/07/23 0324  LIPASE 537* 56* 79*   No results for input(s): "AMMONIA" in the last 168 hours. CBC: Recent Labs  Lab 10/05/23 1742 10/06/23 0317 10/07/23 0324 10/08/23 0319 10/09/23 0326  WBC 11.3* 8.1 7.3 5.9 6.7  NEUTROABS  --  6.0  --   --   --   HGB 14.0 12.4* 11.4* 11.4* 11.9*  HCT 41.8 38.7* 35.1* 35.2* 36.7*  MCV 90.1 94.4 94.1 94.1 93.4  PLT 184 151 144* 154 215   Cardiac Enzymes: No results for input(s): "CKTOTAL", "CKMB", "CKMBINDEX", "TROPONINI" in the last 168 hours. BNP: Invalid input(s): "POCBNP" CBG: No results for input(s): "GLUCAP" in the last 168 hours. D-Dimer No results for input(s): "DDIMER" in the last 72 hours. Hgb A1c No results for input(s): "HGBA1C" in the last 72 hours. Lipid Profile No results for input(s): "CHOL", "HDL", "LDLCALC", "TRIG", "CHOLHDL", "LDLDIRECT" in the last 72 hours. Thyroid function studies No results for input(s): "TSH", "T4TOTAL", "T3FREE", "THYROIDAB" in the last 72 hours.  Invalid input(s): "FREET3" Anemia work up No results for input(s): "VITAMINB12", "FOLATE", "FERRITIN", "TIBC", "IRON", "RETICCTPCT" in the last 72 hours. Urinalysis    Component Value Date/Time   COLORURINE YELLOW 10/05/2023 1742   APPEARANCEUR CLEAR 10/05/2023 1742   LABSPEC 1.025 10/05/2023 1742   PHURINE 7.5 10/05/2023 1742   GLUCOSEU NEGATIVE 10/05/2023 1742   HGBUR NEGATIVE 10/05/2023 1742   BILIRUBINUR SMALL (A) 10/05/2023 1742   KETONESUR NEGATIVE 10/05/2023 1742   PROTEINUR 30 (A) 10/05/2023 1742   NITRITE NEGATIVE 10/05/2023 1742   LEUKOCYTESUR NEGATIVE 10/05/2023 1742   Sepsis Labs Recent Labs  Lab 10/06/23 0317 10/07/23 0324 10/08/23 0319 10/09/23 0326   WBC 8.1 7.3 5.9 6.7   Microbiology Recent Results (from the past 240 hour(s))  Blood culture (routine x 2)     Status: None   Collection Time: 10/05/23  5:50 PM   Specimen: BLOOD  Result Value Ref Range Status   Specimen Description   Final    BLOOD LEFT ANTECUBITAL Performed at [facility], [address], [place], [place] [address]    Special Requests   Final    BOTTLES DRAWN AEROBIC AND ANAEROBIC Blood Culture results may not be optimal due to an excessive volume of blood received in culture bottles Performed at [facility], [address], [place], [place] [address]    Culture   Final  NO GROWTH 5 DAYS Performed at [facility], [address] [place], [place] [address]    Report Status 10/11/2023 FINAL  Final  Blood culture (routine x 2)     Status: None   Collection Time: 10/05/23  8:46 PM   Specimen: BLOOD  Result Value Ref Range Status   Specimen Description   Final    BLOOD LEFT ANTECUBITAL Performed at [facility], [address], [place], [place] [address]    Special Requests   Final    BOTTLES DRAWN AEROBIC AND ANAEROBIC Blood Culture results may not be optimal due to an excessive volume of blood received in culture bottles Performed at [facility], [address] [place], [place] [address]    Culture   Final    NO GROWTH 5 DAYS Performed at [facility], [address], [place], [place] [address]    Report Status 10/11/2023 FINAL  Final     Time coordinating discharge: 32 minutes  SIGNED:   Dorcas Carrow, MD  [facility] 10/12/2023, 11:54 AM

## 2023-10-12 NOTE — TOC Transition Note (Signed)
Transition of Care [facility]) - CM/SW Discharge Note  Patient Details  Name: Jonathan Lane MRN: [id number] Date of Birth: 1941/08/11  [facility]) CM/SW Contact:  Ewing Schlein, LCSW Phone Number: 10/12/2023, 2:24 PM  Clinical Narrative: There are no HH or DME needs at this time, so consult will be cleared. TOC signing off.  Final next level of care: Home/Self Care Barriers to Discharge: No Barriers Identified  Patient Goals and CMS Choice Choice offered to / list presented to : NA  Discharge Plan and Services Additional resources added to the After Visit Summary for          DME Arranged: N/A DME Agency: NA  Social Determinants of Health (SDOH) Interventions SDOH Screenings   Food Insecurity: No Food Insecurity (10/06/2023)  Housing: Low Risk  (10/06/2023)  Transportation Needs: No Transportation Needs (10/06/2023)  Utilities: Not At Risk (10/06/2023)  Tobacco Use: Medium Risk (10/11/2023)   Readmission Risk Interventions    10/08/2023    9:16 AM  Readmission Risk Prevention Plan  Transportation Screening Complete  PCP or Specialist Appt within 5-7 Days Complete  Home Care Screening Complete  Medication Review (RN CM) Complete

## 2023-10-12 NOTE — Progress Notes (Signed)
1 Day Post-Op   Subjective/Chief Complaint: Doing well.  Pain well controlled with Tylenol.  Tolerating solid diet.   Objective: Vital signs in last 24 hours: Temp:  [97.5 F (36.4 C)-98 F (36.7 C)] 97.8 F (36.6 C) (10/25 1022) Pulse Rate:  [51-74] 58 (10/25 1022) Resp:  [11-21] 16 (10/25 1022) BP: (120-189)/(63-90) 120/66 (10/25 1022) SpO2:  [91 %-100 %] 97 % (10/25 1022) FiO2 (%):  [21 %] 21 % (10/24 2327) Weight:  [88 kg] 88 kg (10/24 1151) Last BM Date : 10/09/23  Intake/Output from previous day: 10/24 0701 - 10/25 0700 In: 2237.3 [P.O.:1010; I.V.:1100; IV Piggyback:127.3] Out: 25 [Blood:25] Intake/Output this shift: Total I/O In: 240 [P.O.:240] Out: -   PE: Abd: soft, appropriately tender, incisions c/d/I, ND   Lab Results:  No results for input(s): "WBC", "HGB", "HCT", "PLT" in the last 72 hours.  BMET Recent Labs    10/10/23 0318  NA 132*  K 4.0  CL 99  CO2 23  GLUCOSE 107*  BUN 13  CREATININE 0.80  CALCIUM 8.6*   PT/INR No results for input(s): "LABPROT", "INR" in the last 72 hours.  ABG No results for input(s): "PHART", "HCO3" in the last 72 hours.  Invalid input(s): "PCO2", "PO2"  Studies/Results: No results found.  Anti-infectives: Anti-infectives (From admission, onward)    Start     Dose/Rate Route Frequency Ordered Stop   10/06/23 0400  piperacillin-tazobactam (ZOSYN) IVPB 3.375 g  Status:  Discontinued       Placed in "Followed by" Linked Group   3.375 g 12.5 mL/hr over 240 Minutes Intravenous Every 8 hours 10/05/23 2027 10/11/23 1619   10/05/23 2030  piperacillin-tazobactam (ZOSYN) IVPB 3.375 g       Placed in "Followed by" Linked Group   3.375 g 100 mL/hr over 30 Minutes Intravenous  Once 10/05/23 2027 10/05/23 2125       Assessment/Plan: POD 1, s/p lap chole by Dr. Derrell Lolling, 10/24 for BIliary pancreatitis/Choledocholithiasis -doing well post op, tolerating a diet, and with good pain control -doesn't want narcotics at  discharge -follow up arranged for patient. -surgically stable for DC home. -d/w primary service.  LOS: 6 days    Letha Cape 10/12/2023

## 2023-10-12 NOTE — Discharge Instructions (Signed)

## 2023-10-13 ENCOUNTER — Encounter (HOSPITAL_COMMUNITY): Payer: Self-pay | Admitting: Gastroenterology

## 2023-10-15 LAB — SURGICAL PATHOLOGY

## 2023-10-17 DIAGNOSIS — H35371 Puckering of macula, right eye: Secondary | ICD-10-CM | POA: Diagnosis not present

## 2023-10-17 DIAGNOSIS — H26492 Other secondary cataract, left eye: Secondary | ICD-10-CM | POA: Diagnosis not present

## 2023-10-17 DIAGNOSIS — H353223 Exudative age-related macular degeneration, left eye, with inactive scar: Secondary | ICD-10-CM | POA: Diagnosis not present

## 2023-10-17 DIAGNOSIS — H43813 Vitreous degeneration, bilateral: Secondary | ICD-10-CM | POA: Diagnosis not present

## 2023-10-17 DIAGNOSIS — H353211 Exudative age-related macular degeneration, right eye, with active choroidal neovascularization: Secondary | ICD-10-CM | POA: Diagnosis not present

## 2023-10-18 DIAGNOSIS — R945 Abnormal results of liver function studies: Secondary | ICD-10-CM | POA: Diagnosis not present

## 2023-10-18 DIAGNOSIS — I10 Essential (primary) hypertension: Secondary | ICD-10-CM | POA: Diagnosis not present

## 2023-10-22 ENCOUNTER — Ambulatory Visit: Payer: PPO | Attending: Cardiology | Admitting: Student

## 2023-10-22 DIAGNOSIS — Z01810 Encounter for preprocedural cardiovascular examination: Secondary | ICD-10-CM | POA: Diagnosis not present

## 2023-10-22 NOTE — Progress Notes (Addendum)
Virtual Visit via Telephone Note   Because of Jonathan Lane's co-morbid illnesses, he is at least at moderate risk for complications without adequate follow up.  This format is felt to be most appropriate for this patient at this time.  The patient did not have access to video technology/had technical difficulties with video requiring transitioning to audio format only (telephone).  All issues noted in this document were discussed and addressed.  No physical exam could be performed with this format.  Please refer to the patient's chart for his consent to telehealth for [facility].  Evaluation Performed:  Preoperative cardiovascular risk assessment _____________   Date:  10/22/2023   Patient ID:  Jonathan Lane, DOB 1941/05/13, MRN [id number] Patient Location:  Home Provider location:   Office  Primary Care Provider:  Shon Hale, MD Primary Cardiologist:  Charlton Haws, MD  Chief Complaint / Patient Profile   82 y.o. y/o male with a h/o palpitations, cardiac murmur, hypertension, iliac artery aneurysm, history of DVT on anticoagulation per PCP, TIA, OSA on CPAP, GERD, bladder cancer who is pending cystoscopy/bilateral retrograde pyelogram/left diagnostic ureteroscopy by [facility] on 11/02/2023 and presents today for telephonic preoperative cardiovascular risk assessment.  History of Present Illness    Jonathan Lane is a 82 y.o. male who presents via audio/video conferencing for a telehealth visit today.  Pt was last seen in cardiology clinic on 12/09/2022 by Eligha Bridegroom, NP.  At that time Jonathan Lane was stable from a cardiac standpoint.  The patient is now pending procedure as outlined above. Since his last visit, he is doing well. Patient denies shortness of breath, dyspnea on exertion, orthopnea or PND. He occasionally notices his legs may be a little puffy as evidence by sock marks that resolve as soon as he removes his  socks. No chest pain, pressure, or tightness. No palpitations. He had a recent hospital admission and underwent cholecystectomy. He has done well since discharge. He is active walking 3-4 miles a day.   Past Medical History    Past Medical History:  Diagnosis Date   AAA (abdominal aortic aneurysm) (HCC)    Atrial fibrillation (HCC)    Bradycardia    Bradycardia    Cancer (HCC)    Cardiac murmur    Dizziness    Dysrhythmia    GERD (gastroesophageal reflux disease)    Glaucoma    Heart palpitations    Hyperlipidemia    Hypertension    Numbness 08/26/2019   LEFT FACE   Pneumonia    Skin abnormalities    pre cancerous lesion R hand   Sleep apnea    uses cpap   Snoring    Past Surgical History:  Procedure Laterality Date   APPENDECTOMY  12/19/1963   BIOPSY  10/10/2023   Procedure: BIOPSY;  Surgeon: Lemar Lofty., MD;  Location: [facility];  Service: Gastroenterology;;   CHOLECYSTECTOMY N/A 10/11/2023   Procedure: LAPAROSCOPIC CHOLECYSTECTOMY;  Surgeon: Axel Filler, MD;  Location: [facility];  Service: General;  Laterality: N/A;   COLONOSCOPY     CYSTOSCOPY WITH BIOPSY N/A 05/16/2022   Procedure: CYSTOSCOPY WITH BIOPSY;  Surgeon: Bjorn Pippin, MD;  Location: [facility];  Service: Urology;  Laterality: N/A;   CYSTOSCOPY WITH RETROGRADE PYELOGRAM, URETEROSCOPY AND STENT PLACEMENT Bilateral 05/16/2022   Procedure: CYSTOSCOPY WITH BILATERAL RETROGRADE PYELOGRAM, LEFT URETEROSCOPY WITH BIOPSY, HOLMIUM LASER OF LEFT URETERAL STONE  AND LEFT STENT EXCHANGE, TUR LEFT URETERAL ORIFACE;  Surgeon: Bjorn Pippin,  MD;  Location: [facility];  Service: Urology;  Laterality: Bilateral;  60 MINUTES   CYSTOSCOPY WITH RETROGRADE PYELOGRAM, URETEROSCOPY AND STENT PLACEMENT Left 07/26/2022   Procedure: CYSTOSCOPY WITH RETROGRADE PYELOGRAM, URETEROSCOPY AND STENT EXCHANGE;  Surgeon: Sebastian Ache, MD;  Location: [facility];  Service: Urology;  Laterality: Left;   ERCP N/A 10/10/2023   Procedure:  ENDOSCOPIC RETROGRADE CHOLANGIOPANCREATOGRAPHY (ERCP);  Surgeon: Lemar Lofty., MD;  Location: [facility];  Service: Gastroenterology;  Laterality: N/A;   LYMPH NODE DISSECTION Bilateral 07/26/2022   Procedure: LYMPH NODE DISSECTION;  Surgeon: Sebastian Ache, MD;  Location: [facility];  Service: Urology;  Laterality: Bilateral;   POLYPECTOMY     REMOVAL OF STONES  10/10/2023   Procedure: REMOVAL OF SLUDGE;  Surgeon: Mansouraty, Netty Starring., MD;  Location: [facility];  Service: Gastroenterology;;   SKIN CANCER EXCISION     SPHINCTEROTOMY  10/10/2023   Procedure: Dennison Mascot;  Surgeon: Mansouraty, Netty Starring., MD;  Location: [facility];  Service: Gastroenterology;;   TONSILLECTOMY     as a child x2   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   Procedure: RESTAGING TRANSURETHRAL RESECTION OF BLADDER TUMOR  (TURBT) LEFT URETEROSCOPY WITH LASER LEFT STENT EXCHANGE RIGHT STENT REMOVAL;  Surgeon: Bjorn Pippin, MD;  Location: [facility];  Service: Urology;  Laterality: Bilateral;   TRANSURETHRAL RESECTION OF BLADDER TUMOR WITH MITOMYCIN-C Bilateral 01/03/2022   Procedure: CYSTOSCOPY TRANSURETHRAL RESECTION OF BLADDER TUMOR WITH POSSIBLE  BILATERAL RETROGRADES STENT PLACEMENT;  Surgeon: Bjorn Pippin, MD;  Location: [facility];  Service: Urology;  Laterality: Bilateral;   WISDOM TOOTH EXTRACTION      Allergies  Allergies  Allergen Reactions   Brimonidine Tartrate-Timolol Itching, Rash and Other (See Comments)    Itching eyes Other reaction(s): Not available   Brimonidine Tartrate Other (See Comments)    Irritates the eyes and increased pressure   Tetracycline Hcl Other (See Comments)    Doesn't remember reaction-"like 55 years ago"    Ciprofloxacin Other (See Comments)    Constipation    Home Medications    Prior to Admission medications   Medication Sig Start Date End Date Taking? Authorizing Provider  acetaminophen (TYLENOL) 500 MG tablet Take 500 mg by mouth at  bedtime.    [provider]  Apoaequorin (PREVAGEN EXTRA STRENGTH) 20 MG CAPS Take 1 capsule by mouth daily.    [provider]  atorvastatin (LIPITOR) 20 MG tablet Take 1 tablet (20 mg total) by mouth daily. Patient taking differently: Take 20 mg by mouth at bedtime. 08/26/19   Zannie Cove, MD  Cholecalciferol 25 MCG (1000 UT) capsule Take 1,000 Units by mouth daily.    [provider]  clopidogrel (PLAVIX) 75 MG tablet Take 1 tablet by mouth daily.    [provider]  Coenzyme Q-10 200 MG CAPS Take 1 capsule by mouth daily.    [provider]  COSOPT PF 2-0.5 % SOLN ophthalmic solution Place 1 drop into both eyes in the morning and at bedtime.    [provider]  CRANBERRY PO Take 1 tablet by mouth daily.    [provider]  cyanocobalamin (VITAMIN B12) 1000 MCG tablet Take 500 mcg by mouth daily.    [provider]  diltiazem (DILT-XR) 240 MG 24 hr capsule Take 1 capsule (240 mg total) by mouth daily. Please call our office to schedule an yearly appointment with Dr. Eden Emms for December 2024 before anymore refills. [phone]. Thank you 1st attempt 08/14/23   [name] Stade,  MD  docusate sodium (COLACE) 100 MG capsule Take 1 capsule (100 mg total) by mouth 2 (two) times daily. 07/26/22   Harrie Foreman, PA-C  esomeprazole (NEXIUM) 20 MG capsule Take 20 mg by mouth 2 (two) times daily before a meal.    [provider]  fluticasone (FLONASE) 50 MCG/ACT nasal spray Place 2 sprays into both nostrils daily as needed for allergies or rhinitis.    [provider]  losartan-hydrochlorothiazide (HYZAAR) 100-25 MG tablet Take 1 tablet by mouth daily. 08/14/23   [name] Stade, MD  Melatonin 3 MG CAPS Take 2 capsules by mouth at bedtime. 6 mg nightly    [provider]  Misc Natural Products (PROSTATE HEALTH) CAPS Take 1 capsule by mouth daily.    [provider]  Multiple Vitamins-Minerals (ADULT  GUMMY PO) Take 2 capsules by mouth daily.    [provider]  Multiple Vitamins-Minerals (PRESERVISION AREDS 2+MULTI VIT) CAPS Take 1 capsule by mouth 2 times daily at 12 noon and 4 pm.    [provider]  nitroGLYCERIN (NITROSTAT) 0.4 MG SL tablet Place 1 tablet (0.4 mg total) under the tongue every 5 (five) minutes as needed for chest pain. 08/18/19   Regalado, Belkys A, MD  NON FORMULARY Pt uses a cpap nightly    [provider]  Omega-3 Fatty Acids (FISH OIL) 1200 MG CAPS Take 1 capsule by mouth 2 (two) times daily.    [provider]  OVER THE COUNTER MEDICATION Take 1 tablet by mouth daily. Focus Factor    [provider]  OVER THE COUNTER MEDICATION Take 1 capsule by mouth daily. Saffron 28 mg    [provider]  Probiotic Product (ALIGN PO) Take 1 capsule by mouth in the morning.    [provider]  RHOPRESSA 0.02 % SOLN Place 1 drop into both eyes at bedtime.  01/08/18   [provider]  tadalafil (CIALIS) 5 MG tablet Take 5 mg by mouth daily.    [provider]  tamsulosin (FLOMAX) 0.4 MG CAPS capsule Take 1 capsule (0.4 mg total) by mouth daily. 08/08/22   Crist Fat, MD  valACYclovir (VALTREX) 1000 MG tablet Take 1,000 mg by mouth 2 (two) times daily as needed (for outbreaks).    [provider]  Zinc 25 MG TABS Take 1 tablet by mouth daily.    [provider]    Physical Exam    Vital Signs:  Jonathan Lane does not have vital signs available for review today.  Given telephonic nature of communication, physical exam is limited. AAOx3. NAD. Normal affect.  Speech and respirations are unlabored.  Accessory Clinical Findings    None  Assessment & Plan    Primary Cardiologist: Charlton Haws, MD  Preoperative cardiovascular risk assessment.  Cystoscopy, bilateral retrograde pyelogram, left diagnostic ureteroscopy [facility] on 11/02/2023.  Chart  reviewed as part of pre-operative protocol coverage. According to the RCRI, patient has a 0.9% risk of MACE. Patient reports activity equivalent to >4.0 METS (walks 3-4 miles daily).   Given past medical history and time since last visit, based on ACC/AHA guidelines, Jonathan Lane would be at acceptable risk for the planned procedure without further cardiovascular testing.   Patient was advised that if he develops new symptoms prior to surgery to contact our office to arrange a follow-up appointment.  he verbalized understanding.  Plavix prescribed by a noncardiology provider (PCP) therefore recommendations for holding deferred to prescribing  provider.    I will route this recommendation to the requesting party via Epic fax function.  Please call with questions.  Time:   Today, I have spent 10 minutes with the patient with telehealth technology discussing medical history, symptoms, and management plan.     Carlos Levering, NP  10/22/2023, 8:51 AM

## 2023-10-29 ENCOUNTER — Encounter (HOSPITAL_BASED_OUTPATIENT_CLINIC_OR_DEPARTMENT_OTHER): Payer: Self-pay | Admitting: Urology

## 2023-10-29 ENCOUNTER — Other Ambulatory Visit: Payer: Self-pay

## 2023-10-29 NOTE — Progress Notes (Addendum)
Spoke w/ via phone for pre-op interview---pt Lab needs dos---- I stat        Lab results------ COVID test -----patient states asymptomatic no test needed Arrive at -------530 11-02-2023 NPO after MN NO Solid Food.  Clear liquids from MN until---430 Med rec completed Medications to take morning of surgery ----- nexium, cialis, cosopt eye drop Diabetic medication -----n/a Patient instructed no nail polish to be worn day of surgery Patient instructed to bring photo id and insurance card day of surgery Patient aware to have Driver (ride ) / caregiver    for 24 hours after surgery - sign other Jonathan Lane  Patient Special Instructions -----bring cpap mask tubing and machine and leave in car Pre-Op special Instructions -----none Patient verbalized understanding of instructions that were given at this phone interview. Patient denies chest pain, sob, fever, cough at the interview.   Cardiac clearance 10-22-2023 deborah wittenhorn np on chart/epic [facility] cardiology michelle swinyer np 11-24-2022 Echo 12-28-2022 epic Dr Chanetta Marshall note to stop plavix 5 days before surgery on chart

## 2023-11-01 MED ORDER — GENTAMICIN SULFATE 40 MG/ML IJ SOLN
5.0000 mg/kg | INTRAVENOUS | Status: AC
  Administered 2023-11-02: 440 mg via INTRAVENOUS
  Filled 2023-11-01: qty 11

## 2023-11-01 NOTE — Anesthesia Preprocedure Evaluation (Addendum)
Anesthesia Evaluation  Patient identified by MRN, date of birth, ID band Patient awake    Reviewed: Allergy & Precautions, NPO status , Patient's Chart, lab work & pertinent test results  History of Anesthesia Complications Negative for: history of anesthetic complications  Airway Mallampati: II  TM Distance: >3 FB Neck ROM: Full    Dental  (+) Dental Advisory Given, Teeth Intact   Pulmonary sleep apnea and Continuous Positive Airway Pressure Ventilation , former smoker   Pulmonary exam normal        Cardiovascular hypertension, Pt. on medications + Peripheral Vascular Disease and + DVT  Normal cardiovascular exam   '24 TTE - EF 60-65%. The right ventricular size is moderately enlarged. Trivial mitral valve regurgitation. Aortic valve sclerosis/calcification is present, without any evidence  of aortic stenosis.     Neuro/Psych TIA negative psych ROS   GI/Hepatic Neg liver ROS,GERD  Medicated and Controlled,,  Endo/Other  negative endocrine ROS    Renal/GU negative Renal ROS    Bladder cancer     Musculoskeletal negative musculoskeletal ROS (+)    Abdominal   Peds  Hematology negative hematology ROS (+)   Anesthesia Other Findings   Reproductive/Obstetrics                             Anesthesia Physical Anesthesia Plan  ASA: 3  Anesthesia Plan: General   Post-op Pain Management: Tylenol PO (pre-op)* and Minimal or no pain anticipated   Induction: Intravenous  PONV Risk Score and Plan: 2 and Treatment may vary due to age or medical condition, Ondansetron and TIVA  Airway Management Planned: LMA  Additional Equipment: None  Intra-op Plan:   Post-operative Plan: Extubation in OR  Informed Consent: I have reviewed the patients History and Physical, chart, labs and discussed the procedure including the risks, benefits and alternatives for the proposed anesthesia with the  patient or authorized representative who has indicated his/her understanding and acceptance.     Dental advisory given  Plan Discussed with: CRNA and Anesthesiologist  Anesthesia Plan Comments:         Anesthesia Quick Evaluation

## 2023-11-02 ENCOUNTER — Encounter (HOSPITAL_BASED_OUTPATIENT_CLINIC_OR_DEPARTMENT_OTHER)
Admission: RE | Disposition: A | Discharge status: Home / Self Care | Payer: Self-pay | Source: Home / Self Care | Attending: Urology

## 2023-11-02 ENCOUNTER — Ambulatory Visit (HOSPITAL_BASED_OUTPATIENT_CLINIC_OR_DEPARTMENT_OTHER): Payer: PPO | Admitting: Anesthesiology

## 2023-11-02 ENCOUNTER — Encounter (HOSPITAL_BASED_OUTPATIENT_CLINIC_OR_DEPARTMENT_OTHER): Payer: Self-pay | Admitting: Urology

## 2023-11-02 ENCOUNTER — Ambulatory Visit (HOSPITAL_BASED_OUTPATIENT_CLINIC_OR_DEPARTMENT_OTHER)
Admission: RE | Admit: 2023-11-02 | Discharge: 2023-11-02 | Disposition: A | Discharge status: Home / Self Care | Payer: PPO | Attending: Urology | Admitting: Urology

## 2023-11-02 DIAGNOSIS — D414 Neoplasm of uncertain behavior of bladder: Secondary | ICD-10-CM | POA: Insufficient documentation

## 2023-11-02 DIAGNOSIS — Z86718 Personal history of other venous thrombosis and embolism: Secondary | ICD-10-CM | POA: Insufficient documentation

## 2023-11-02 DIAGNOSIS — Z8554 Personal history of malignant neoplasm of ureter: Secondary | ICD-10-CM | POA: Insufficient documentation

## 2023-11-02 DIAGNOSIS — G4730 Sleep apnea, unspecified: Secondary | ICD-10-CM | POA: Diagnosis not present

## 2023-11-02 DIAGNOSIS — Z87891 Personal history of nicotine dependence: Secondary | ICD-10-CM | POA: Insufficient documentation

## 2023-11-02 DIAGNOSIS — K219 Gastro-esophageal reflux disease without esophagitis: Secondary | ICD-10-CM | POA: Diagnosis not present

## 2023-11-02 DIAGNOSIS — C662 Malignant neoplasm of left ureter: Secondary | ICD-10-CM | POA: Diagnosis not present

## 2023-11-02 DIAGNOSIS — Z79899 Other long term (current) drug therapy: Secondary | ICD-10-CM | POA: Insufficient documentation

## 2023-11-02 DIAGNOSIS — I10 Essential (primary) hypertension: Secondary | ICD-10-CM | POA: Diagnosis not present

## 2023-11-02 DIAGNOSIS — Z01818 Encounter for other preprocedural examination: Secondary | ICD-10-CM

## 2023-11-02 DIAGNOSIS — I739 Peripheral vascular disease, unspecified: Secondary | ICD-10-CM | POA: Diagnosis not present

## 2023-11-02 DIAGNOSIS — D49512 Neoplasm of unspecified behavior of left kidney: Secondary | ICD-10-CM | POA: Diagnosis not present

## 2023-11-02 DIAGNOSIS — D4959 Neoplasm of unspecified behavior of other genitourinary organ: Secondary | ICD-10-CM

## 2023-11-02 HISTORY — PX: CYSTOSCOPY WITH RETROGRADE PYELOGRAM, URETEROSCOPY AND STENT PLACEMENT: SHX5789

## 2023-11-02 HISTORY — DX: Aneurysm of iliac artery: I72.3

## 2023-11-02 HISTORY — PX: CYSTOSCOPY WITH HOLMIUM LASER LITHOTRIPSY: SHX6639

## 2023-11-02 HISTORY — DX: Acute embolism and thrombosis of unspecified vein: I82.90

## 2023-11-02 LAB — POCT I-STAT, CHEM 8
BUN: 13 mg/dL (ref 8–23)
Calcium, Ion: 1.26 mmol/L (ref 1.15–1.40)
Chloride: 102 mmol/L (ref 98–111)
Creatinine, Ser: 0.9 mg/dL (ref 0.61–1.24)
Glucose, Bld: 110 mg/dL — ABNORMAL HIGH (ref 70–99)
HCT: 37 % — ABNORMAL LOW (ref 39.0–52.0)
Hemoglobin: 12.6 g/dL — ABNORMAL LOW (ref 13.0–17.0)
Potassium: 3.8 mmol/L (ref 3.5–5.1)
Sodium: 141 mmol/L (ref 135–145)
TCO2: 25 mmol/L (ref 22–32)

## 2023-11-02 SURGERY — CYSTOURETEROSCOPY, WITH RETROGRADE PYELOGRAM AND STENT INSERTION
Anesthesia: General | Site: Ureter | Laterality: Left

## 2023-11-02 MED ORDER — OXYCODONE HCL 5 MG/5ML PO SOLN: 5.0000 mg | Freq: Once | ORAL | Status: DC | PRN

## 2023-11-02 MED ORDER — ONDANSETRON HCL 4 MG/2ML IJ SOLN
INTRAMUSCULAR | Status: AC
  Filled 2023-11-02: qty 2

## 2023-11-02 MED ORDER — STERILE WATER FOR IRRIGATION IR SOLN
Status: DC | PRN
  Administered 2023-11-02: 500 mL

## 2023-11-02 MED ORDER — ACETAMINOPHEN 500 MG PO TABS
ORAL_TABLET | ORAL | Status: AC
  Filled 2023-11-02: qty 2

## 2023-11-02 MED ORDER — DEXAMETHASONE SODIUM PHOSPHATE 10 MG/ML IJ SOLN
INTRAMUSCULAR | Status: DC | PRN
  Administered 2023-11-02: 4 mg via INTRAVENOUS

## 2023-11-02 MED ORDER — OXYCODONE HCL 5 MG PO TABS: 5.0000 mg | ORAL_TABLET | Freq: Once | ORAL | Status: DC | PRN

## 2023-11-02 MED ORDER — LACTATED RINGERS IV SOLN: INTRAVENOUS | Status: DC

## 2023-11-02 MED ORDER — PROPOFOL 500 MG/50ML IV EMUL
INTRAVENOUS | Status: DC | PRN
  Administered 2023-11-02: 125 ug/kg/min via INTRAVENOUS

## 2023-11-02 MED ORDER — EPHEDRINE SULFATE-NACL 50-0.9 MG/10ML-% IV SOSY
PREFILLED_SYRINGE | INTRAVENOUS | Status: DC | PRN
  Administered 2023-11-02 (×2): 5 mg via INTRAVENOUS

## 2023-11-02 MED ORDER — FENTANYL CITRATE (PF) 100 MCG/2ML IJ SOLN
INTRAMUSCULAR | Status: DC | PRN
  Administered 2023-11-02: 50 ug via INTRAVENOUS

## 2023-11-02 MED ORDER — ACETAMINOPHEN 500 MG PO TABS
1000.0000 mg | ORAL_TABLET | Freq: Once | ORAL | Status: AC
  Administered 2023-11-02: 1000 mg via ORAL

## 2023-11-02 MED ORDER — TRAMADOL HCL 50 MG PO TABS: 50.0000 mg | ORAL_TABLET | Freq: Four times a day (QID) | ORAL | 0 refills | Status: DC | PRN

## 2023-11-02 MED ORDER — FENTANYL CITRATE (PF) 100 MCG/2ML IJ SOLN: 25.0000 ug | INTRAMUSCULAR | Status: DC | PRN

## 2023-11-02 MED ORDER — SODIUM CHLORIDE 0.9 % IV SOLN: INTRAVENOUS | Status: DC

## 2023-11-02 MED ORDER — WHITE PETROLATUM EX OINT
TOPICAL_OINTMENT | CUTANEOUS | Status: AC
  Filled 2023-11-02: qty 5

## 2023-11-02 MED ORDER — ONDANSETRON HCL 4 MG/2ML IJ SOLN: 4.0000 mg | Freq: Once | INTRAMUSCULAR | Status: DC | PRN

## 2023-11-02 MED ORDER — FENTANYL CITRATE (PF) 100 MCG/2ML IJ SOLN
INTRAMUSCULAR | Status: AC
  Filled 2023-11-02: qty 2

## 2023-11-02 MED ORDER — IOHEXOL 300 MG/ML  SOLN
INTRAMUSCULAR | Status: DC | PRN
  Administered 2023-11-02: 20 mL via URETHRAL

## 2023-11-02 MED ORDER — LIDOCAINE 2% (20 MG/ML) 5 ML SYRINGE
INTRAMUSCULAR | Status: DC | PRN
  Administered 2023-11-02: 60 mg via INTRAVENOUS

## 2023-11-02 MED ORDER — PROPOFOL 10 MG/ML IV BOLUS
INTRAVENOUS | Status: DC | PRN
  Administered 2023-11-02: 140 mg via INTRAVENOUS

## 2023-11-02 MED ORDER — LIDOCAINE HCL (PF) 2 % IJ SOLN
INTRAMUSCULAR | Status: AC
  Filled 2023-11-02: qty 5

## 2023-11-02 MED ORDER — SODIUM CHLORIDE 0.9 % IR SOLN
Status: DC | PRN
  Administered 2023-11-02: 3000 mL via INTRAVESICAL

## 2023-11-02 MED ORDER — ONDANSETRON HCL 4 MG/2ML IJ SOLN
INTRAMUSCULAR | Status: DC | PRN
  Administered 2023-11-02: 4 mg via INTRAVENOUS

## 2023-11-02 MED ORDER — PROPOFOL 500 MG/50ML IV EMUL
INTRAVENOUS | Status: AC
  Filled 2023-11-02: qty 50

## 2023-11-02 MED ORDER — DEXAMETHASONE SODIUM PHOSPHATE 10 MG/ML IJ SOLN
INTRAMUSCULAR | Status: AC
  Filled 2023-11-02: qty 1

## 2023-11-02 SURGICAL SUPPLY — 24 items
BAG DRAIN URO-CYSTO SKYTR STRL (DRAIN) ×3 IMPLANT
BASKET LASER NITINOL 1.9FR (BASKET) IMPLANT
CATH URETL OPEN END 6FR 70 (CATHETERS) ×1 IMPLANT
CLOTH BEACON ORANGE TIMEOUT ST (SAFETY) ×3 IMPLANT
GLOVE BIO SURGEON STRL SZ7.5 (GLOVE) ×3 IMPLANT
GOWN STRL REUS W/TWL LRG LVL3 (GOWN DISPOSABLE) ×3 IMPLANT
GUIDEWIRE ANG ZIPWIRE 038X150 (WIRE) ×3 IMPLANT
GUIDEWIRE STR DUAL SENSOR (WIRE) ×3 IMPLANT
IV NS 1000ML (IV SOLUTION)
IV NS 1000ML BAXH (IV SOLUTION) ×2 IMPLANT
IV NS IRRIG 3000ML ARTHROMATIC (IV SOLUTION) ×3 IMPLANT
KIT TURNOVER CYSTO (KITS) ×3 IMPLANT
LASER FIB FLEXIVA PULSE ID 365 (Laser) IMPLANT
MANIFOLD NEPTUNE II (INSTRUMENTS) ×3 IMPLANT
NS IRRIG 500ML POUR BTL (IV SOLUTION) ×2 IMPLANT
PACK CYSTO (CUSTOM PROCEDURE TRAY) ×3 IMPLANT
SLEEVE SCD COMPRESS KNEE MED (STOCKING) ×3 IMPLANT
SYR 10ML LL (SYRINGE) ×3 IMPLANT
TRACTIP FLEXIVA PULS ID 200XHI (Laser) ×1 IMPLANT
TRACTIP FLEXIVA PULSE ID 200 (Laser) ×3
TUBE CONNECTING 12X1/4 (SUCTIONS) ×3 IMPLANT
TUBE PU 8FR 16IN ENFIT (TUBING) IMPLANT
TUBING UROLOGY SET (TUBING) ×3 IMPLANT
WATER STERILE IRR 500ML POUR (IV SOLUTION) ×1 IMPLANT

## 2023-11-02 NOTE — Brief Op Note (Signed)
11/02/2023  8:01 AM  PATIENT:  Jonathan Lane  82 y.o. male  PRE-OPERATIVE DIAGNOSIS:  LEFT URETERAL CANCER  POST-OPERATIVE DIAGNOSIS:  LEFT URETERAL CANCER  PROCEDURE:  Procedure(s): CYSTOSCOPY WITH BILATERAL RETROGRADE PYELOGRAM, AND LEFT DIAGNOSTIC URETEROSCOPY (Bilateral) CYSTOSCOPY WITH HOLMIUM LASER ABLATION OF LEFT RENAL TUMOR (Left)  SURGEON:  Surgeons and Role:    * Akai Dollard, Delbert Phenix., MD - Primary  PHYSICIAN ASSISTANT:   ASSISTANTS: none   ANESTHESIA:   general  EBL:  5 mL   BLOOD ADMINISTERED:none  DRAINS: none   LOCAL MEDICATIONS USED:  NONE  SPECIMEN:  No Specimen  DISPOSITION OF SPECIMEN:  N/A  COUNTS:  YES  TOURNIQUET:  * No tourniquets in log *  DICTATION: .Other Dictation: Dictation Number [id number]  PLAN OF CARE: Discharge to home after PACU  PATIENT DISPOSITION:  PACU - hemodynamically stable.   Delay start of Pharmacological VTE agent (>24hrs) due to surgical blood loss or risk of bleeding: yes

## 2023-11-02 NOTE — Anesthesia Procedure Notes (Signed)
Procedure Name: LMA Insertion Date/Time: 11/02/2023 7:32 AM  Performed by: Briant Sites, CRNAPre-anesthesia Checklist: Patient identified, Emergency Drugs available, Suction available and Patient being monitored Patient Re-evaluated:Patient Re-evaluated prior to induction Oxygen Delivery Method: Circle system utilized Preoxygenation: Pre-oxygenation with 100% oxygen Induction Type: IV induction Ventilation: Mask ventilation without difficulty LMA: LMA inserted LMA Size: 5.0 Number of attempts: 1 Airway Equipment and Method: Bite block Placement Confirmation: positive ETCO2 Tube secured with: Tape Dental Injury: Teeth and Oropharynx as per pre-operative assessment

## 2023-11-02 NOTE — Anesthesia Postprocedure Evaluation (Signed)
Anesthesia Post Note  Patient: Jonathan Lane  Procedure(s) Performed: CYSTOSCOPY WITH BILATERAL RETROGRADE PYELOGRAM, AND LEFT DIAGNOSTIC URETEROSCOPY (Bilateral: Ureter) HOLMIUM LASER ABLATION OF LEFT RENAL TUMOR (Left: Ureter)     Patient location during evaluation: PACU Anesthesia Type: General Level of consciousness: awake and alert Pain management: pain level controlled Vital Signs Assessment: post-procedure vital signs reviewed and stable Respiratory status: spontaneous breathing, nonlabored ventilation and respiratory function stable Cardiovascular status: stable and blood pressure returned to baseline Anesthetic complications: no   No notable events documented.  Last Vitals:  Vitals:   11/02/23 0815 11/02/23 0830  BP: 117/77   Pulse:    Resp: 16   Temp:  36.7 C  SpO2:      Last Pain:  Vitals:   11/02/23 0830  TempSrc:   PainSc: 0-No pain                 Beryle Lathe

## 2023-11-02 NOTE — Transfer of Care (Signed)
Immediate Anesthesia Transfer of Care Note  Patient: Johnryan Urbanovsky Reffett  Procedure(s) Performed: CYSTOSCOPY WITH BILATERAL RETROGRADE PYELOGRAM, AND LEFT DIAGNOSTIC URETEROSCOPY (Bilateral: Ureter) HOLMIUM LASER ABLATION OF LEFT RENAL TUMOR (Left: Ureter)  Patient Location: PACU  Anesthesia Type:General  Level of Consciousness: drowsy  Airway & Oxygen Therapy: Patient Spontanous Breathing and Patient connected to nasal cannula oxygen  Post-op Assessment: Report given to RN  Post vital signs: Reviewed and stable  Last Vitals:  Vitals Value Taken Time  BP 121/69 11/02/23 0811  Temp    Pulse 53 11/02/23 0812  Resp    SpO2 98 % 11/02/23 0812  Vitals shown include unfiled device data.  Last Pain:  Vitals:   11/02/23 2952  TempSrc: Oral  PainSc: 0-No pain      Patients Stated Pain Goal: 3 (11/02/23 8413)  Complications: No notable events documented.

## 2023-11-02 NOTE — Discharge Instructions (Addendum)
1 - You may have urinary urgency (bladder spasms) and bloody urine on / off for up to 2 weeks. This is normal.  2 - Call MD or go to ER for fever >102, severe pain / nausea / vomiting not relieved by medications, or acute change in medical status    You were given Tylenol prior to your surgical procedure today, please do not take anymore Tylenol until after 1pm today

## 2023-11-02 NOTE — H&P (Signed)
Jonathan Lane is an 82 y.o. male.    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 ?Recent Surveillance: ?3/20224 - CT, CMP, Cysto - no recurrence, Cr 0.7;   ??2 - Erectile Dysfunciton - on tadalafil daily + sildenafil stacking, prior penile SWL   ??3- Urolithiasis - left lower pole 7mm stone on CT 11/2021 ?  ?PMH sig for open appy, ?Tia/Plavix (prevention only, no deficits, has held previously), DVT. His partern Jonathan Lane (met after his wife passed around 2021) is very involved. He is retired from [employer] for [employer], [place].   ??Today " Jonathan Lane " is seen to proceed with cysto, retrogrades, and left diagnostic ureteroscopy for surveillance of ureteral cancer. No interval fevers. Most recent UA without infectious parameters.    Past Medical History:  Diagnosis Date   Blood clot in vein    2023 after surgery, dvt, eliquis x 6 months   Bradycardia    Cancer (HCC)    bladder   Cardiac murmur    GERD (gastroesophageal reflux disease)    Glaucoma    Heart palpitations    Hyperlipidemia    Hypertension    Iliac artery aneurysm (HCC)    Numbness 08/26/2019   LEFT FACE   Pneumonia    yrs ago   Skin abnormalities    pre cancerous lesion R hand   Sleep apnea    uses cpap   Snoring     Past Surgical History:  Procedure Laterality Date   APPENDECTOMY  12/19/1963   BIOPSY  10/10/2023   Procedure: BIOPSY;  Surgeon: Lemar Lofty., MD;  Location: [facility];  Service: Gastroenterology;;   CHOLECYSTECTOMY N/A 10/11/2023   Procedure: LAPAROSCOPIC CHOLECYSTECTOMY;  Surgeon: Axel Filler, MD;  Location: [facility];  Service: General;  Laterality: N/A;   COLONOSCOPY     CYSTOSCOPY WITH BIOPSY N/A 05/16/2022   Procedure:  CYSTOSCOPY WITH BIOPSY;  Surgeon: Bjorn Pippin, MD;  Location: [facility];  Service: Urology;  Laterality: N/A;   CYSTOSCOPY WITH RETROGRADE PYELOGRAM, URETEROSCOPY AND STENT PLACEMENT Bilateral 05/16/2022   Procedure: CYSTOSCOPY WITH BILATERAL RETROGRADE PYELOGRAM, LEFT URETEROSCOPY WITH BIOPSY, HOLMIUM LASER OF LEFT URETERAL STONE  AND LEFT STENT EXCHANGE, TUR LEFT URETERAL ORIFACE;  Surgeon: Bjorn Pippin, MD;  Location: [facility];  Service: Urology;  Laterality: Bilateral;  60 MINUTES   CYSTOSCOPY WITH RETROGRADE PYELOGRAM, URETEROSCOPY AND STENT PLACEMENT Left 07/26/2022   Procedure: CYSTOSCOPY WITH RETROGRADE PYELOGRAM, URETEROSCOPY AND STENT EXCHANGE;  Surgeon: Sebastian Ache, MD;  Location: [facility];  Service: Urology;  Laterality: Left;   ERCP N/A 10/10/2023   Procedure: ENDOSCOPIC RETROGRADE CHOLANGIOPANCREATOGRAPHY (ERCP);  Surgeon: Lemar Lofty., MD;  Location: [facility];  Service: Gastroenterology;  Laterality: N/A;   LYMPH NODE DISSECTION Bilateral 07/26/2022   Procedure: LYMPH NODE DISSECTION;  Surgeon: Sebastian Ache, MD;  Location: [facility];  Service: Urology;  Laterality: Bilateral;   POLYPECTOMY     REMOVAL OF STONES  10/10/2023   Procedure: REMOVAL OF SLUDGE;  Surgeon: Meridee Score Netty Starring., MD;  Location: [facility];  Service: Gastroenterology;;   SKIN CANCER EXCISION  04/2023   mohs procedure right leg   SPHINCTEROTOMY  10/10/2023   Procedure: SPHINCTEROTOMY;  Surgeon: Mansouraty, Netty Starring., MD;  Location: [facility];  Service: Gastroenterology;;   TONSILLECTOMY  as a child x2   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   Procedure: RESTAGING TRANSURETHRAL RESECTION OF BLADDER TUMOR  (TURBT) LEFT URETEROSCOPY WITH LASER LEFT STENT EXCHANGE RIGHT STENT REMOVAL;  Surgeon: Bjorn Pippin, MD;  Location: [facility];  Service: Urology;  Laterality: Bilateral;   TRANSURETHRAL RESECTION OF BLADDER TUMOR WITH MITOMYCIN-C Bilateral 01/03/2022   Procedure:  CYSTOSCOPY TRANSURETHRAL RESECTION OF BLADDER TUMOR WITH POSSIBLE  BILATERAL RETROGRADES STENT PLACEMENT;  Surgeon: Bjorn Pippin, MD;  Location: [facility];  Service: Urology;  Laterality: Bilateral;   WISDOM TOOTH EXTRACTION      Family History  Problem Relation Age of Onset   Stroke Father    Stroke Mother    Stroke Sister    Breast cancer Sister    High blood pressure Sister    Diabetes Sister    Colon cancer Other        Uncle   Colon cancer Paternal Uncle    Mitral valve prolapse Other        sugery 09-27-2017   Rectal cancer Neg Hx    Stomach cancer Neg Hx    Social History:  reports that he quit smoking about 57 years ago. His smoking use included cigarettes. He has never been exposed to tobacco smoke. He has never used smokeless tobacco. He reports that he does not currently use alcohol. He reports that he does not use drugs.  Allergies:  Allergies  Allergen Reactions   Brimonidine Tartrate-Timolol Itching, Rash and Other (See Comments)    Itching eyes Other reaction(s): Not available   Brimonidine Tartrate Other (See Comments)    Irritates the eyes and increased pressure   Tetracycline Hcl Other (See Comments)    Doesn't remember reaction-"like 55 years ago"    Ciprofloxacin Other (See Comments)    Constipation    No medications prior to admission.    No results found for this or any previous visit (from the past 48 hour(s)). No results found.  Review of Systems  Constitutional:  Negative for chills and fever.  All other systems reviewed and are negative.   Height 6\' 1"  (1.854 m), weight 86.2 kg. Physical Exam Vitals reviewed.  Constitutional:      Comments: Spry for age  HENT:     Mouth/Throat:     Mouth: Mucous membranes are moist.  Eyes:     Pupils: Pupils are equal, round, and reactive to light.  Cardiovascular:     Rate and Rhythm: Normal rate.  Pulmonary:     Effort: Pulmonary effort is normal.  Abdominal:     General: Abdomen is flat.      Comments: Piror scars w/o hernias  Genitourinary:    Comments: No CVAT at present Musculoskeletal:        General: Normal range of motion.     Cervical back: Normal range of motion.  Skin:    General: Skin is warm.  Neurological:     General: No focal deficit present.     Mental Status: He is alert.  Psychiatric:        Mood and Affect: Mood normal.      Assessment/Plan  Proceed as planned with cdysto, bilateral retrogrades, LEFT diagnostic ureteroscopy. Risks, benefits, alternatives, expected peri-op course discussed previously and reiterated today.   Loletta Parish., MD 11/02/2023, 5:18 AM

## 2023-11-02 NOTE — Op Note (Unsigned)
NAME: Jonathan Lane, STRENGER MEDICAL RECORD NO: [id number] ACCOUNT NO: [id number] DATE OF BIRTH: [birth year] FACILITY: [facility] LOCATION: [facility] PHYSICIAN: Sebastian Ache, MD  Operative Report   DATE OF PROCEDURE: 11/02/2023  PREOPERATIVE DIAGNOSIS:  History of left ureteral cancer.  POSTOPERATIVE DIAGNOSES:  History of left ureteral cancer plus small volume left lower pole renal urothelial tumor.  ESTIMATED BLOOD LOSS:  Nil.  COMPLICATION:  None.  SPECIMEN:  None.  PROCEDURE PERFORMED: 1.  Cystoscopy with bilateral retrograde pyelograms and interpretation. 2.  Left ureteroscopy with laser ablation of tumor.  FINDINGS: 1.  Unremarkable right retrograde pyelogram. 2.  Unremarkable bladder. 3.  Unremarkable left ureter. 4.  Subtle papillary changes lower pole, left kidney, total surface area approximately 1 cm, all visible papillary tumor laser ablated.  INDICATIONS:  The patient is a pleasant 82 year old man with a history of multifocal urothelial carcinoma including left distal ureteral cancer.  He underwent a segmental resection of this a little over a year ago and has done quite well and has been  very compliant with surveillance.  He is due for surveillance of his left ureter as well as and he presents for this today endoscopically.  Informed consent was obtained and placed in medical record.  DESCRIPTION OF PROCEDURE:  The patient being verified, procedure being cysto with bilateral retrogrades and left diagnostic ureteroscopy was confirmed.  Procedure timeout was performed.  Intravenous antibiotics administered.  General anesthesia  introduced.  The patient was placed into a low lithotomy position.  Sterile field was created, prepping and draping the patient's penis, perineum, proximal thigh using iodine.  Cystourethroscopy was performed using 21-French rigid cystoscope with offset  lens.  Inspection of the anterior and posterior urethra was unremarkable.  Inspection of the  urinary bladder revealed no diverticula, calcifications, or papillary lesions.  The left ureteral orifice was absent in its native position and reimplanted at  the dome on the left as anticipated.  The right ureteral orifice was cannulated with a 6-French end-hole catheter and right retrograde pyelogram was obtained.   Right retrograde pyelogram demonstrated single right ureter, single system right kidney.  No filling defects or narrowing noted.  Left retrograde pyelogram was obtained.  Left retrograde pyelogram demonstrated single left ureter, single system left kidney.  No filling defects or narrowing noted.  0.038 ZIPwire was advanced to the level of upper pole, set aside as a safety wire.  An 8-French feeding tube placed into the  urinary bladder  for pressure release.  A semirigid ureteroscopy was performed of the distal half of the remaining left ureter using a semirigid  ureteroscope over the sensor working wire.  The semirigid scope was then exchanged for a single-channel  flexible digital ureteroscope to the level of the kidney using fluoroscopic guidance and flexible digital ureteroscopy was performed of entire left kidney including all calyces x2.  There were very, very subtle papillary changes noted in the extreme left  lower pole.  Photodocumentation was performed.  Given the extreme angulation, this was completely not amenable to any sort of tissue sampling or biopsy.  However, this was amenable to laser ablation.  As such, holmium laser energy applied to the area of  tumor using settings of 1 joule and 10 Hz.  Using a defocused technique, laser ablation was performed of all areas of subtle papillary changes in the lower pole.  I was quite happy with the completeness and safety of this.  Hemostasis was complete.   Kidney was once again inspected.  No evidence  of perforation.  The ureteroscope was removed under continuous vision.  The left ureter was completely wide open and atraumatic.  As  such, it was not felt that interval stenting would be warranted.  The  bladder was emptied.  The procedure was terminated.  The patient tolerated the procedure well.  No immediate periprocedural complications.  The patient taken to postanesthesia care unit in stable condition.  We will discuss options further including  continued surveillance versus additional treatment, has a followup appointment soon.   SHW D: 11/02/2023 8:06:31 am T: 11/02/2023 9:21:00 am  JOB: [id number]/ [id number]

## 2023-11-06 ENCOUNTER — Encounter (HOSPITAL_BASED_OUTPATIENT_CLINIC_OR_DEPARTMENT_OTHER): Payer: Self-pay | Admitting: Urology

## 2023-11-08 DIAGNOSIS — R945 Abnormal results of liver function studies: Secondary | ICD-10-CM | POA: Diagnosis not present

## 2023-11-12 DIAGNOSIS — G4733 Obstructive sleep apnea (adult) (pediatric): Secondary | ICD-10-CM | POA: Diagnosis not present

## 2023-11-19 DIAGNOSIS — C662 Malignant neoplasm of left ureter: Secondary | ICD-10-CM | POA: Diagnosis not present

## 2023-11-19 DIAGNOSIS — C670 Malignant neoplasm of trigone of bladder: Secondary | ICD-10-CM | POA: Diagnosis not present

## 2023-11-25 NOTE — Progress Notes (Unsigned)
Cardiology Office Note:  .   Date:  11/26/2023  ID:  Jonathan Lane, DOB 13-Dec-1941, MRN [id number] PCP: Shon Hale, MD  [facility] Providers Cardiologist:  Charlton Haws, MD {  History of Present Illness: .   Jonathan Lane is a 82 y.o. male with a history of atypical chest pain, HTN, bradycardia, OSA on CPAP, palpitations, and bladder cancer here for follow-up.  He was seen by cardiology in consultation during hospitalization 08/18/2019 for evaluation of chest pain.  Wife had passed away 3 days prior and he was under a lot of stress.  Wife was admitted with sepsis and he was not happy with her care and felt she was transferred to ICU too late.  No acute findings on his exam at that time.  Myoview 03/26/2018 was normal.  Presented 08/25/2019 with left-sided facial numbness and left upper arm numbness that began on the morning of presentation.  He was seen by neurology and found to have a TIA.  MRI was normal.  CTA with vertebral disease and distal left V4 segment proximal to the VB junction.  It was recommended to take aspirin and Plavix for 3 weeks followed by Plavix alone.  Lipitor was increased to 20 mg daily.  Also with recommendations for a 30-day event monitor which revealed no significant arrhythmias.  TTE 08/18/2019 with EF greater than 65%, no significant valve disease.  She sees Dr. 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  Chest/abdominal/pelvis CTA 08/18/2019 with no dissection or any acute aortic changes.  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  [facility] was consulted.  Was seen 05/25/2022 with no change in maximal diameter of right common iliac artery aneurysm, advised to return in 2 years.  When he was last seen a year ago he had just gone through urologic surgery which was easier than anticipated.  He continues take Eliquis with plan to continue for 6 months for provoked DVT.  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  No specific cardiac complaints.  Very analytical thinks about why things have occurred and why he has certain diagnoses.  No further episodes of chest pain or palpitations.  Denies shortness of breath, orthopnea, PND, edema, presyncope, syncope.  No bleeding problems.  Resumed walking daily, no activity intolerance with walking 2 miles.  Hydrates well and tries to limit sodium.  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 He is currently on Plavix and uses a CPAP machine at night. He has stopped taking Eliquis after a 52-month course following a DVT. He reports occasional constipation, which he manages with increased water intake and prune juice. He is active, walking one to two miles daily, and notes that his blood pressure decreases after exercise. He experiences slight dizziness during certain exercises, but it is not debilitating and does not occur consistently. He has been diagnosed with PACs, but is not symptomatic. His blood pressure is well-controlled, and he has no symptoms of heart failure. He has a history of PVCs but limits his caffeine and those have effectively went away.  Reports no shortness of breath nor dyspnea on exertion. Reports no chest pain, pressure, or tightness. No edema, orthopnea, PND. Reports no palpitations.    Discussed the use of AI  scribe software for clinical note transcription with the patient, who gave verbal consent to proceed.  ROS: pertinent ROS in HPI  Studies Reviewed: .       Echo 12/2022 IMPRESSIONS     1. Left ventricular ejection fraction, by estimation, is 60 to 65%. The  left ventricle has normal function. The left ventricle has no regional  wall motion abnormalities. Left ventricular diastolic parameters are  indeterminate. Elevated left ventricular   end-diastolic pressure.   2. Right ventricular systolic function is normal. The right ventricular  size is moderately enlarged.   3. The mitral valve is normal in structure. Trivial mitral valve  regurgitation. No evidence of mitral stenosis.   4. The aortic valve is tricuspid. There is moderate calcification of the  aortic valve. There is moderate thickening of the aortic valve. Aortic  valve regurgitation is not visualized. Aortic valve  sclerosis/calcification is present, without any evidence  of aortic stenosis.   5. The inferior vena cava is normal in size with greater than 50%  respiratory variability, suggesting right atrial pressure of 3 mmHg.   FINDINGS   Left Ventricle: Left ventricular ejection fraction, by estimation, is 60  to 65%. The left ventricle has normal function. The left ventricle has no  regional wall motion abnormalities. The left ventricular internal cavity  size was normal in size. There is   no left ventricular hypertrophy. Left ventricular diastolic parameters  are indeterminate. Elevated left ventricular end-diastolic pressure.   Right Ventricle: The right ventricular size is moderately enlarged. No  increase in right ventricular wall thickness. Right ventricular systolic  function is normal.   Left Atrium: Left atrial size was normal in size.   Right Atrium: Right atrial size was normal in size.   Pericardium: There is no evidence of pericardial effusion.   Mitral Valve: The mitral valve is normal in structure. Trivial mitral  valve regurgitation. No evidence of mitral valve stenosis.   Tricuspid Valve: The tricuspid valve is normal in structure. Tricuspid  valve regurgitation is not demonstrated. No evidence of tricuspid  stenosis.   Aortic Valve: The aortic valve is tricuspid. There is moderate  calcification of the aortic valve. There is moderate thickening of the  aortic valve. Aortic valve regurgitation is not visualized. Aortic valve   sclerosis/calcification is present, without any   evidence of aortic stenosis.   Pulmonic Valve: The pulmonic valve was normal in structure. Pulmonic valve  regurgitation is trivial. No evidence of pulmonic stenosis.   Aorta: The aortic root is normal in size and structure.   Venous: The inferior vena cava is normal in size with greater than 50%  respiratory variability, suggesting right atrial pressure of 3 mmHg.   IAS/Shunts: No atrial level shunt detected by color flow Doppler.          Physical Exam:   VS:  BP (!) 106/58   Pulse 60   Ht 6\' 1"  (1.854 m)   Wt 197 lb 9.6 oz (89.6 kg)   SpO2 93%   BMI 26.07 kg/m    Wt Readings from Last 3 Encounters:  11/26/23 197 lb 9.6 oz (89.6 kg)  11/02/23 194 lb 8 oz (88.2 kg)  10/11/23 194 lb (88 kg)    GEN: Well nourished, well developed in no acute distress NECK: No JVD; No carotid bruits CARDIAC:RRR with occasional skipped beat, no murmurs, rubs, gallops RESPIRATORY:  Clear to auscultation without rales, wheezing or rhonchi  ABDOMEN: Soft, non-tender, non-distended EXTREMITIES:  No edema; No  deformity   ASSESSMENT AND PLAN: .    TIA -on plavix alone, continue  -no further symptoms  Premature Atrial Contractions (PACs) Noted on EKG and during auscultation. No symptoms of palpitations or fluttering reported. No limitations on activity. -Continue current activity level and monitor for any changes in symptoms. -Consider further monitoring with a 7-14 day Zio patch if symptoms develop or increase in frequency.  History of Deep Vein Thrombosis (DVT) No current symptoms of DVT. No current edema, resolves overnight. -Continue current activity level and hydration. -Monitor for any changes in leg swelling or pain.  Hypertension Blood pressure low in office today (108/58), but typically runs 120-140s at home. No symptoms of lightheadedness or dizziness reported. -Continue current antihypertensive regimen. -Monitor blood pressure  at home and report any significant changes or symptoms.  Constipation Managed with increased hydration and prune juice. -Continue current management strategy.  Iliac artery aneurysm followed by [facility]      Dispo: He can follow-up in 6 months with Dr. Eden Emms  Signed, Sharlene Dory, PA-C

## 2023-11-26 ENCOUNTER — Ambulatory Visit: Payer: PPO | Attending: Physician Assistant | Admitting: Physician Assistant

## 2023-11-26 ENCOUNTER — Encounter: Payer: Self-pay | Admitting: Physician Assistant

## 2023-11-26 VITALS — BP 106/58 | HR 60 | Ht 73.0 in | Wt 197.6 lb

## 2023-11-26 DIAGNOSIS — Z86718 Personal history of other venous thrombosis and embolism: Secondary | ICD-10-CM | POA: Diagnosis not present

## 2023-11-26 DIAGNOSIS — I10 Essential (primary) hypertension: Secondary | ICD-10-CM

## 2023-11-26 DIAGNOSIS — I359 Nonrheumatic aortic valve disorder, unspecified: Secondary | ICD-10-CM | POA: Diagnosis not present

## 2023-11-26 DIAGNOSIS — R002 Palpitations: Secondary | ICD-10-CM | POA: Diagnosis not present

## 2023-11-26 DIAGNOSIS — I723 Aneurysm of iliac artery: Secondary | ICD-10-CM | POA: Diagnosis not present

## 2023-11-26 NOTE — Patient Instructions (Signed)
Medication Instructions:  Your physician recommends that you continue on your current medications as directed. Please refer to the Current Medication list given to you today.  *If you need a refill on your cardiac medications before your next appointment, please call your pharmacy*   Lab Work: None ordered  If you have labs (blood work) drawn today and your tests are completely normal, you will receive your results only by: MyChart Message (if you have MyChart) OR A paper copy in the mail If you have any lab test that is abnormal or we need to change your treatment, we will call you to review the results.   Testing/Procedures: None ordered   Follow-Up: At [facility], you and your health needs are our priority.  As part of our continuing mission to provide you with exceptional heart care, we have created designated Provider Care Teams.  These Care Teams include your primary Cardiologist (physician) and Advanced Practice Providers (APPs -  Physician Assistants and Nurse Practitioners) who all work together to provide you with the care you need, when you need it.  We recommend signing up for the patient portal called "MyChart".  Sign up information is provided on this After Visit Summary.  MyChart is used to connect with patients for Virtual Visits (Telemedicine).  Patients are able to view lab/test results, encounter notes, upcoming appointments, etc.  Non-urgent messages can be sent to your provider as well.   To learn more about what you can do with MyChart, go to [email or web address].    Your next appointment:   6 month(s)  Provider:   Peter Nishan, MD     Other Instructions 

## 2023-12-04 DIAGNOSIS — S86812A Strain of other muscle(s) and tendon(s) at lower leg level, left leg, initial encounter: Secondary | ICD-10-CM | POA: Diagnosis not present

## 2023-12-14 DIAGNOSIS — I723 Aneurysm of iliac artery: Secondary | ICD-10-CM | POA: Diagnosis not present

## 2023-12-14 DIAGNOSIS — R7303 Prediabetes: Secondary | ICD-10-CM | POA: Diagnosis not present

## 2023-12-14 DIAGNOSIS — Z0000 Encounter for general adult medical examination without abnormal findings: Secondary | ICD-10-CM | POA: Diagnosis not present

## 2023-12-14 DIAGNOSIS — K219 Gastro-esophageal reflux disease without esophagitis: Secondary | ICD-10-CM | POA: Diagnosis not present

## 2023-12-14 DIAGNOSIS — I10 Essential (primary) hypertension: Secondary | ICD-10-CM | POA: Diagnosis not present

## 2023-12-14 DIAGNOSIS — R7309 Other abnormal glucose: Secondary | ICD-10-CM | POA: Diagnosis not present

## 2023-12-14 DIAGNOSIS — I700 Atherosclerosis of aorta: Secondary | ICD-10-CM | POA: Diagnosis not present

## 2023-12-14 DIAGNOSIS — K5909 Other constipation: Secondary | ICD-10-CM | POA: Diagnosis not present

## 2023-12-14 DIAGNOSIS — E782 Mixed hyperlipidemia: Secondary | ICD-10-CM | POA: Diagnosis not present

## 2023-12-14 DIAGNOSIS — C662 Malignant neoplasm of left ureter: Secondary | ICD-10-CM | POA: Diagnosis not present

## 2023-12-27 ENCOUNTER — Encounter (INDEPENDENT_AMBULATORY_CARE_PROVIDER_SITE_OTHER): Payer: Self-pay

## 2023-12-27 ENCOUNTER — Ambulatory Visit (INDEPENDENT_AMBULATORY_CARE_PROVIDER_SITE_OTHER): Payer: PPO | Admitting: Otolaryngology

## 2023-12-27 VITALS — BP 128/72 | HR 73 | Ht 73.0 in | Wt 195.0 lb

## 2023-12-27 DIAGNOSIS — H6123 Impacted cerumen, bilateral: Secondary | ICD-10-CM | POA: Insufficient documentation

## 2023-12-27 NOTE — Progress Notes (Signed)
 Patient ID: Jonathan Lane, male   DOB: 06-Oct-1941, 83 y.o.   MRN: [id number]  Procedure: Bilateral cerumen disimpaction.   Indication: Cerumen impaction, resulting in ear discomfort and conductive hearing loss.   Description: The patient is placed supine on the operating table. Under the operating microscope, the right ear canal is examined and is noted to be impacted with cerumen. The cerumen is carefully removed with a combination of suction catheters, cerumen curette, and alligator forceps. After the cerumen removal, the ear canal and tympanic membrane are noted to be normal. No middle ear effusion is noted. The same procedure is then repeated on the left side without exception. The patient tolerated the procedure well.  Follow-up care:  The patient is instructed not to use Q-tips to clean the ear canals. The patient will follow up in 6 months.

## 2024-03-06 DIAGNOSIS — H401134 Primary open-angle glaucoma, bilateral, indeterminate stage: Secondary | ICD-10-CM | POA: Diagnosis not present

## 2024-03-10 ENCOUNTER — Encounter: Payer: Self-pay | Admitting: *Deleted

## 2024-03-11 ENCOUNTER — Telehealth: Payer: PPO | Admitting: Adult Health

## 2024-03-11 VITALS — BP 139/70 | HR 50

## 2024-03-11 DIAGNOSIS — G4733 Obstructive sleep apnea (adult) (pediatric): Secondary | ICD-10-CM | POA: Diagnosis not present

## 2024-03-11 NOTE — Progress Notes (Signed)
PATIENT: Jonathan Lane DOB: [birth year]  REASON FOR VISIT: follow up HISTORY FROM: patient  Virtual Visit via Video Note  I connected with Jonathan Lane on 03/11/24 at  2:30 PM EDT by a video enabled telemedicine application located remotely at [facility] and verified that I am speaking with the correct person using two identifiers who was located at their own home in [place]    I discussed the limitations of evaluation and management by telemedicine and the availability of in person appointments. The patient expressed understanding and agreed to proceed.   PATIENT: Jonathan Lane DOB: March 19, 1941  REASON FOR VISIT: follow up HISTORY FROM: patient  HISTORY OF PRESENT ILLNESS: Today 03/11/24:  Jonathan Lane is a 83 y.o. male with a history of OSA on CPAP. Returns today for follow-up. Reports that CPAP is working well. Denies any new issues. Continues to find it beneficial.  Reports that the only new medical history is he has had his gallbladder taken out since last seen.  He also has a spot on his kidney that they are watching.  His download is below     03/13/23: Jonathan Lane is a 83 y.o. male with a history of OSA on CPAP. Returns today for follow-up.  Reports that the CPAP is working well for him.  He denies any new issues.  His download is below       REVIEW OF SYSTEMS: Out of a complete 14 system review of symptoms, the patient complains only of the following symptoms, and all other reviewed systems are negative.  ALLERGIES: Allergies  Allergen Reactions   Brimonidine Tartrate-Timolol Itching, Rash and Other (See Comments)    Itching eyes Other reaction(s): Not available   Brimonidine Tartrate Other (See Comments)    Irritates the eyes and increased pressure   Tetracycline Hcl Other (See Comments)    Doesn't remember reaction-"like 55 years ago"    Ciprofloxacin Other (See Comments)    Constipation    HOME  MEDICATIONS: Outpatient Medications Prior to Visit  Medication Sig Dispense Refill   acetaminophen (TYLENOL) 500 MG tablet Take 500 mg by mouth at bedtime.     Apoaequorin (PREVAGEN EXTRA STRENGTH) 20 MG CAPS Take 1 capsule by mouth daily.     atorvastatin (LIPITOR) 20 MG tablet Take 1 tablet (20 mg total) by mouth daily. (Patient taking differently: Take 20 mg by mouth at bedtime.) 30 tablet 0   Cholecalciferol 25 MCG (1000 UT) capsule Take 1,000 Units by mouth daily.     clopidogrel (PLAVIX) 75 MG tablet Take 1 tablet by mouth daily.     Coenzyme Q-10 200 MG CAPS Take 1 capsule by mouth daily.     COSOPT PF 2-0.5 % SOLN ophthalmic solution Place 1 drop into both eyes in the morning and at bedtime.     CRANBERRY PO Take 1 tablet by mouth daily.     cyanocobalamin (VITAMIN B12) 1000 MCG tablet Take 500 mcg by mouth daily.     diltiazem (DILT-XR) 240 MG 24 hr capsule Take 1 capsule (240 mg total) by mouth daily. Please call our office to schedule an yearly appointment with Dr. Eden Emms for December 2024 before anymore refills. [phone]. Thank you 1st attempt (Patient taking differently: Take 240 mg by mouth every evening. Please call our office to schedule an yearly appointment with Dr. Eden Emms for December 2024 before anymore refills. [phone]. Thank you 1st attempt) 90 capsule 1   docusate sodium (COLACE) 100  MG capsule Take 1 capsule (100 mg total) by mouth 2 (two) times daily. (Patient taking differently: Take 200 mg by mouth daily.)     esomeprazole (NEXIUM) 20 MG capsule Take 20 mg by mouth 2 (two) times daily before a meal.     fluticasone (FLONASE) 50 MCG/ACT nasal spray Place 2 sprays into both nostrils daily as needed for allergies or rhinitis.     losartan-hydrochlorothiazide (HYZAAR) 100-25 MG tablet Take 1 tablet by mouth daily. (Patient taking differently: Take 1 tablet by mouth every evening.) 90 tablet 1   Melatonin 3 MG CAPS Take 2 capsules by mouth at bedtime. 6 mg nightly      Misc Natural Products (PROSTATE HEALTH) CAPS Take 1 capsule by mouth daily.     Multiple Vitamins-Minerals (ADULT GUMMY PO) Take 2 capsules by mouth daily.     Multiple Vitamins-Minerals (PRESERVISION AREDS 2+MULTI VIT) CAPS Take 1 capsule by mouth 2 times daily at 12 noon and 4 pm.     nitroGLYCERIN (NITROSTAT) 0.4 MG SL tablet Place 1 tablet (0.4 mg total) under the tongue every 5 (five) minutes as needed for chest pain. 10 tablet 1   NON FORMULARY Pt uses a cpap nightly     Omega-3 Fatty Acids (FISH OIL) 1200 MG CAPS Take 1 capsule by mouth 2 (two) times daily.     OVER THE COUNTER MEDICATION Take 1 tablet by mouth daily. Focus Factor     OVER THE COUNTER MEDICATION Take 1 capsule by mouth daily. Saffron 28 mg     Probiotic Product (ALIGN PO) Take 1 capsule by mouth in the morning.     RHOPRESSA 0.02 % SOLN Place 1 drop into both eyes at bedtime.      tadalafil (CIALIS) 5 MG tablet Take 5 mg by mouth daily.     tamsulosin (FLOMAX) 0.4 MG CAPS capsule Take 1 capsule (0.4 mg total) by mouth daily. (Patient taking differently: Take 0.4 mg by mouth daily after supper.) 30 capsule 0   valACYclovir (VALTREX) 1000 MG tablet Take 1,000 mg by mouth 2 (two) times daily as needed (for outbreaks).     Zinc 25 MG TABS Take 1 tablet by mouth daily.     No facility-administered medications prior to visit.    PAST MEDICAL HISTORY: Past Medical History:  Diagnosis Date   Blood clot in vein    2023 after surgery, dvt, eliquis x 6 months   Bradycardia    Cancer (HCC)    bladder   Cardiac murmur    GERD (gastroesophageal reflux disease)    Glaucoma    Heart palpitations    Hyperlipidemia    Hypertension    Iliac artery aneurysm (HCC)    Numbness 08/26/2019   LEFT FACE   Pneumonia    yrs ago   Skin abnormalities    pre cancerous lesion R hand   Sleep apnea    uses cpap   Snoring     PAST SURGICAL HISTORY: Past Surgical History:  Procedure Laterality Date   APPENDECTOMY  12/19/1963    BIOPSY  10/10/2023   Procedure: BIOPSY;  Surgeon: Lemar Lofty., MD;  Location: [facility];  Service: Gastroenterology;;   CHOLECYSTECTOMY N/A 10/11/2023   Procedure: LAPAROSCOPIC CHOLECYSTECTOMY;  Surgeon: Axel Filler, MD;  Location: [facility];  Service: General;  Laterality: N/A;   COLONOSCOPY     CYSTOSCOPY WITH BIOPSY N/A 05/16/2022   Procedure: CYSTOSCOPY WITH BIOPSY;  Surgeon: Bjorn Pippin, MD;  Location: [facility];  Service:  Urology;  Laterality: N/A;   CYSTOSCOPY WITH HOLMIUM LASER LITHOTRIPSY Left 11/02/2023   Procedure: HOLMIUM LASER ABLATION OF LEFT RENAL TUMOR;  Surgeon: Loletta Parish., MD;  Location[facility];  Service: Urology;  Laterality: Left;   CYSTOSCOPY WITH RETROGRADE PYELOGRAM, URETEROSCOPY AND STENT PLACEMENT Bilateral 05/16/2022   Procedure: CYSTOSCOPY WITH BILATERAL RETROGRADE PYELOGRAM, LEFT URETEROSCOPY WITH BIOPSY, HOLMIUM LASER OF LEFT URETERAL STONE  AND LEFT STENT EXCHANGE, TUR LEFT URETERAL ORIFACE;  Surgeon: Bjorn Pippin, MD;  Location: [facility];  Service: Urology;  Laterality: Bilateral;  60 MINUTES   CYSTOSCOPY WITH RETROGRADE PYELOGRAM, URETEROSCOPY AND STENT PLACEMENT Left 07/26/2022   Procedure: CYSTOSCOPY WITH RETROGRADE PYELOGRAM, URETEROSCOPY AND STENT EXCHANGE;  Surgeon: Sebastian Ache, MD;  Location: [facility];  Service: Urology;  Laterality: Left;   CYSTOSCOPY WITH RETROGRADE PYELOGRAM, URETEROSCOPY AND STENT PLACEMENT Bilateral 11/02/2023   Procedure: CYSTOSCOPY WITH BILATERAL RETROGRADE PYELOGRAM, AND LEFT DIAGNOSTIC URETEROSCOPY;  Surgeon: Loletta Parish., MD;  Location: [facility];  Service: Urology;  Laterality: Bilateral;   ERCP N/A 10/10/2023   Procedure: ENDOSCOPIC RETROGRADE CHOLANGIOPANCREATOGRAPHY (ERCP);  Surgeon: Lemar Lofty., MD;  Location: [facility];  Service: Gastroenterology;  Laterality: N/A;   LYMPH NODE DISSECTION Bilateral 07/26/2022   Procedure: LYMPH NODE DISSECTION;   Surgeon: Sebastian Ache, MD;  Location: [facility];  Service: Urology;  Laterality: Bilateral;   POLYPECTOMY     REMOVAL OF STONES  10/10/2023   Procedure: REMOVAL OF SLUDGE;  Surgeon: Meridee Score Netty Starring., MD;  Location: [facility];  Service: Gastroenterology;;   SKIN CANCER EXCISION  04/2023   mohs procedure right leg   SPHINCTEROTOMY  10/10/2023   Procedure: SPHINCTEROTOMY;  Surgeon: Mansouraty, Netty Starring., MD;  Location: [facility];  Service: Gastroenterology;;   TONSILLECTOMY     as a child x2   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   Procedure: RESTAGING TRANSURETHRAL RESECTION OF BLADDER TUMOR  (TURBT) LEFT URETEROSCOPY WITH LASER LEFT STENT EXCHANGE RIGHT STENT REMOVAL;  Surgeon: Bjorn Pippin, MD;  Location: [facility];  Service: Urology;  Laterality: Bilateral;   TRANSURETHRAL RESECTION OF BLADDER TUMOR WITH MITOMYCIN-C Bilateral 01/03/2022   Procedure: CYSTOSCOPY TRANSURETHRAL RESECTION OF BLADDER TUMOR WITH POSSIBLE  BILATERAL RETROGRADES STENT PLACEMENT;  Surgeon: Bjorn Pippin, MD;  Location: [facility];  Service: Urology;  Laterality: Bilateral;   WISDOM TOOTH EXTRACTION      FAMILY HISTORY: Family History  Problem Relation Age of Onset   Stroke Father    Stroke Mother    Stroke Sister    Breast cancer Sister    High blood pressure Sister    Diabetes Sister    Colon cancer Other        Uncle   Colon cancer Paternal Uncle    Mitral valve prolapse Other        sugery 09-27-2017   Rectal cancer Neg Hx    Stomach cancer Neg Hx     SOCIAL HISTORY: Social History   Socioeconomic History   Marital status: Widowed    Spouse name: Stark Bray   Number of children: 1   Years of education: Masters   Highest education level: Not on file  Occupational History    Employer: RETIRED  Tobacco Use   Smoking status: Former    Current packs/day: 0.00    Types: Cigarettes    Quit date: 12/18/1965    Years since quitting: 58.2    Passive exposure: Never    Smokeless tobacco: Never  Vaping Use  Vaping status: Never Used  Substance and Sexual Activity   Alcohol use: Not Currently   Drug use: No   Sexual activity: Not on file  Other Topics Concern   Not on file  Social History Narrative   Patient is married Stark Bray).   Patient is retired.   Patient has one adult child.   Patient does not drink any caffeine.   Patient is right-handed.   Patient has a Event organiser.            Social Drivers of Corporate investment banker Strain: Not on file  Food Insecurity: No Food Insecurity (10/06/2023)   Hunger Vital Sign    Worried About Running Out of Food in the Last Year: Never true    Ran Out of Food in the Last Year: Never true  Transportation Needs: No Transportation Needs (10/06/2023)   PRAPARE - Administrator, Civil Service (Medical): No    Lack of Transportation (Non-Medical): No  Physical Activity: Not on file  Stress: Not on file  Social Connections: Not on file  Intimate Partner Violence: Not At Risk (10/06/2023)   Humiliation, Afraid, Rape, and Kick questionnaire    Fear of Current or Ex-Partner: No    Emotionally Abused: No    Physically Abused: No    Sexually Abused: No      PHYSICAL EXAM Generalized: Well developed, in no acute distress   Neurological examination  Mentation: Alert oriented to time, place, history taking. Follows all commands speech and language fluent Cranial nerve II-XII: Facial symmetry noted  DIAGNOSTIC DATA (LABS, IMAGING, TESTING) - I reviewed patient records, labs, notes, testing and imaging myself where available.  Lab Results  Component Value Date   WBC 6.7 10/09/2023   HGB 12.6 (L) 11/02/2023   HCT 37.0 (L) 11/02/2023   MCV 93.4 10/09/2023   PLT 215 10/09/2023      Component Value Date/Time   NA 141 11/02/2023 0707   NA 138 03/10/2020 1043   K 3.8 11/02/2023 0707   CL 102 11/02/2023 0707   CO2 23 10/10/2023 0318   GLUCOSE 110 (H) 11/02/2023 0707   BUN 13  11/02/2023 0707   BUN 21 03/10/2020 1043   CREATININE 0.90 11/02/2023 0707   CALCIUM 8.6 (L) 10/10/2023 0318   PROT 6.9 10/10/2023 0318   ALBUMIN 2.9 (L) 10/10/2023 0318   AST 193 (H) 10/10/2023 0318   ALT 284 (H) 10/10/2023 0318   ALKPHOS 226 (H) 10/10/2023 0318   BILITOT 3.3 (H) 10/10/2023 0318   GFRNONAA >60 10/10/2023 0318   GFRAA 94 03/10/2020 1043   Lab Results  Component Value Date   CHOL 87 10/06/2023   HDL 32 (L) 10/06/2023   LDLCALC 46 10/06/2023   TRIG 44 10/06/2023   CHOLHDL 2.7 10/06/2023   Lab Results  Component Value Date   HGBA1C 5.6 08/26/2019      ASSESSMENT AND PLAN 83 y.o. year old male  has a past medical history of Blood clot in vein, Bradycardia, Cancer (HCC), Cardiac murmur, GERD (gastroesophageal reflux disease), Glaucoma, Heart palpitations, Hyperlipidemia, Hypertension, Iliac artery aneurysm (HCC), Numbness (08/26/2019), Pneumonia, Skin abnormalities, Sleep apnea, and Snoring. here with:  OSA on CPAP  CPAP compliance excellent Residual AHI is good Encouraged patient to continue using CPAP nightly and > 4 hours each night F/U in 1 year or sooner if needed    Butch Penny, MSN, NP-C 03/11/2024, 2:01 PM [facility] [address] [place], [place]  [address] [phone]  

## 2024-03-11 NOTE — Patient Instructions (Signed)
 Continue using CPAP nightly and greater than 4 hours each night If your symptoms worsen or you develop new symptoms please let us know.

## 2024-03-19 DIAGNOSIS — H353211 Exudative age-related macular degeneration, right eye, with active choroidal neovascularization: Secondary | ICD-10-CM | POA: Diagnosis not present

## 2024-03-19 DIAGNOSIS — H353223 Exudative age-related macular degeneration, left eye, with inactive scar: Secondary | ICD-10-CM | POA: Diagnosis not present

## 2024-03-19 DIAGNOSIS — H43813 Vitreous degeneration, bilateral: Secondary | ICD-10-CM | POA: Diagnosis not present

## 2024-03-27 ENCOUNTER — Other Ambulatory Visit: Payer: Self-pay | Admitting: Cardiovascular Disease

## 2024-05-02 DIAGNOSIS — D485 Neoplasm of uncertain behavior of skin: Secondary | ICD-10-CM | POA: Diagnosis not present

## 2024-05-02 DIAGNOSIS — C44629 Squamous cell carcinoma of skin of left upper limb, including shoulder: Secondary | ICD-10-CM | POA: Diagnosis not present

## 2024-05-06 DIAGNOSIS — C670 Malignant neoplasm of trigone of bladder: Secondary | ICD-10-CM | POA: Diagnosis not present

## 2024-05-06 DIAGNOSIS — R338 Other retention of urine: Secondary | ICD-10-CM | POA: Diagnosis not present

## 2024-05-06 DIAGNOSIS — N5201 Erectile dysfunction due to arterial insufficiency: Secondary | ICD-10-CM | POA: Diagnosis not present

## 2024-05-06 DIAGNOSIS — C662 Malignant neoplasm of left ureter: Secondary | ICD-10-CM | POA: Diagnosis not present

## 2024-05-06 DIAGNOSIS — N200 Calculus of kidney: Secondary | ICD-10-CM | POA: Diagnosis not present

## 2024-05-08 DIAGNOSIS — G4733 Obstructive sleep apnea (adult) (pediatric): Secondary | ICD-10-CM | POA: Diagnosis not present

## 2024-05-20 DIAGNOSIS — C670 Malignant neoplasm of trigone of bladder: Secondary | ICD-10-CM | POA: Diagnosis not present

## 2024-05-20 DIAGNOSIS — R310 Gross hematuria: Secondary | ICD-10-CM | POA: Diagnosis not present

## 2024-05-20 DIAGNOSIS — C662 Malignant neoplasm of left ureter: Secondary | ICD-10-CM | POA: Diagnosis not present

## 2024-05-21 ENCOUNTER — Other Ambulatory Visit: Payer: Self-pay | Admitting: Urology

## 2024-05-21 DIAGNOSIS — C44629 Squamous cell carcinoma of skin of left upper limb, including shoulder: Secondary | ICD-10-CM | POA: Diagnosis not present

## 2024-05-21 DIAGNOSIS — L905 Scar conditions and fibrosis of skin: Secondary | ICD-10-CM | POA: Diagnosis not present

## 2024-05-23 NOTE — Patient Instructions (Addendum)
 SURGICAL WAITING ROOM VISITATION Patients having surgery or a procedure may have no more than 2 support people in the waiting area - these visitors may rotate in the visitor waiting room.   If the patient needs to stay at the hospital during part of their recovery, the visitor guidelines for inpatient rooms apply.  PRE-OP VISITATION  Pre-op nurse will coordinate an appropriate time for 1 support person to accompany the patient in pre-op.  This support person may not rotate.  This visitor will be contacted when the time is appropriate for the visitor to come back in the pre-op area.  Please refer to the [facility] website for the visitor guidelines for Inpatients (after your surgery is over and you are in a regular room).  You are not required to quarantine at this time prior to your surgery. However, you must do this: Hand Hygiene often Do NOT share personal items Notify your provider if you are in close contact with someone who has COVID or you develop fever 100.4 or greater, new onset of sneezing, cough, sore throat, shortness of breath or body aches.  If you test positive for Covid or have been in contact with anyone that has tested positive in the last 10 days please notify you surgeon.    Your procedure is scheduled on:  THURSDAY May 29, 2024  Report to [facility] Main Entrance: [address] entrance where the [employer] is available.   Report to admitting at: 10:15    AM  Call this number if you have any questions or problems the morning of surgery [phone]  DO NOT EAT OR DRINK ANYTHING AFTER MIDNIGHT THE NIGHT PRIOR TO YOUR SURGERY / PROCEDURE.   FOLLOW  ANY ADDITIONAL PRE OP INSTRUCTIONS YOU RECEIVED FROM YOUR SURGEON'S OFFICE!!!   Oral Hygiene is also important to reduce your risk of infection.        Remember - BRUSH YOUR TEETH THE MORNING OF SURGERY WITH YOUR REGULAR TOOTHPASTE  Do NOT smoke after Midnight the night before surgery.  STOP TAKING all Vitamins,  Herbs and supplements 1 week before your surgery.   PLAVIX  (clopidigrel)-  patient already stopped taking on: ????  Take ONLY these medicines the morning of surgery with A SIP OF WATER : esomeprazole  (Nexium ), tamsulosin , and you may take your eye drops if needed.    If You have been diagnosed with Sleep Apnea - Bring CPAP mask and tubing day of surgery. We will provide you with a CPAP machine on the day of your surgery.                   You may not have any metal on your body including  jewelry, and body piercing  Do not wear  lotions, powders,cologne, or deodorant  Men may shave face and neck.  Contacts, Hearing Aids, dentures or bridgework may not be worn into surgery. DENTURES WILL BE REMOVED PRIOR TO SURGERY PLEASE DO NOT APPLY "Poly grip" OR ADHESIVES!!!  Patients discharged on the day of surgery will not be allowed to drive home.  Someone NEEDS to stay with you for the first 24 hours after anesthesia.  Do not bring your home medications to the hospital. The Pharmacy will dispense medications listed on your medication list to you during your admission in the Hospital.  Special Instructions: Bring a copy of your healthcare power of attorney and living will documents the day of surgery, if you wish to have them scanned into your [facility] Medical Records-  EPIC  Please read over the following fact sheets you were given: IF YOU HAVE QUESTIONS ABOUT YOUR PRE-OP INSTRUCTIONS, PLEASE CALL [phone].   [place] - Preparing for Surgery Before surgery, you can play an important role.  Because skin is not sterile, your skin needs to be as free of germs as possible.  You can reduce the number of germs on your skin by washing with CHG (chlorahexidine gluconate) soap before surgery.  CHG is an antiseptic cleaner which kills germs and bonds with the skin to continue killing germs even after washing. Please DO NOT use if you have an allergy to CHG or antibacterial soaps.  If your skin  becomes reddened/irritated stop using the CHG and inform your nurse when you arrive at Short Stay. Do not shave (including legs and underarms) for at least 48 hours prior to the first CHG shower.  You may shave your face/neck.  Please follow these instructions carefully:  1.  Shower with CHG Soap the night before surgery and the  morning of surgery.  2.  If you choose to wash your hair, wash your hair first as usual with your normal  shampoo.  3.  After you shampoo, rinse your hair and body thoroughly to remove the shampoo.                             4.  Use CHG as you would any other liquid soap.  You can apply chg directly to the skin and wash.  Gently with a scrungie or clean washcloth.  5.  Apply the CHG Soap to your body ONLY FROM THE NECK DOWN.   Do not use on face/ open                           Wound or open sores. Avoid contact with eyes, ears mouth and genitals (private parts).                       Wash face,  Genitals (private parts) with your normal soap.             6.  Wash thoroughly, paying special attention to the area where your  surgery  will be performed.  7.  Thoroughly rinse your body with warm water  from the neck down.  8.  DO NOT shower/wash with your normal soap after using and rinsing off the CHG Soap.            9.  Pat yourself dry with a clean towel.            10.  Wear clean pajamas.            11.  Place clean sheets on your bed the night of your first shower and do not  sleep with pets.  ON THE DAY OF SURGERY : Do not apply any lotions/deodorants the morning of surgery.  Please wear clean clothes to the hospital/surgery center.    FAILURE TO FOLLOW THESE INSTRUCTIONS MAY RESULT IN THE CANCELLATION OF YOUR SURGERY  PATIENT SIGNATURE_________________________________  NURSE SIGNATURE__________________________________  ________________________________________________________________________

## 2024-05-25 NOTE — Progress Notes (Signed)
 COVID Vaccine received:  []  No []  Yes Date of any COVID positive Test in last 90 days:  PCP - Denna Fish, MD at [facility] [phone]  Cardiologist -Janelle Mediate, MD    Chest x-ray - 09-02-2022  1v  Epic EKG -  11-26-2023  Epic Stress Test -  ECHO - 12-28-2022  Epic Cardiac Cath -  CT Coronary Calcium  score:   Bowel Prep - [x]  No  []   Yes ______  Pacemaker / ICD device [x]  No []  Yes   Spinal Cord Stimulator:[x]  No []  Yes       History of Sleep Apnea? []  No [x]  Yes   CPAP used?- []  No [x]  Yes    Does the patient monitor blood sugar?   [x]  N/A   []  No []  Yes  Patient has: [x]  NO Hx DM   []  Pre-DM   []  DM1  []   DM2 Last A1c was:   5.6 normal on 08-26-2019      Blood Thinner / Instructions:Plavix  - already on hold pre Med. Reconn. Aspirin  Instructions: none know  ERAS Protocol Ordered: [x]  No  []  Yes Patient is to be NPO after: MN  Dental hx: []  Dentures:  []  N/A      []  Bridge or Partial:                   []  Loose or Damaged teeth:   Comments:   Activity level: Able to walk up 2 flights of stairs without becoming significantly short of breath or having chest pain?  []  No   []    Yes   Anesthesia review: OSA-CPAP, HTN, A.FIB, Hx TIA, GERD, glaucoma, Hx provoked DVT after surgery 2023.  Patient denies shortness of breath, fever, cough and chest pain at PAT appointment.  Patient verbalized understanding and agreement to the Pre-Surgical Instructions that were given to them at this PAT appointment. Patient was also educated of the need to review these PAT instructions again prior to his surgery.I reviewed the appropriate phone numbers to call if they have any and questions or concerns.

## 2024-05-26 ENCOUNTER — Encounter (HOSPITAL_COMMUNITY)
Admission: RE | Admit: 2024-05-26 | Discharge: 2024-05-26 | Disposition: A | Discharge status: Home / Self Care | Source: Ambulatory Visit | Attending: Urology

## 2024-05-26 ENCOUNTER — Other Ambulatory Visit: Payer: Self-pay | Admitting: Urology

## 2024-05-26 ENCOUNTER — Encounter (HOSPITAL_COMMUNITY): Payer: Self-pay

## 2024-05-26 ENCOUNTER — Other Ambulatory Visit: Payer: Self-pay

## 2024-05-26 VITALS — BP 155/72 | HR 53 | Temp 97.9°F | Resp 18 | Ht 73.0 in | Wt 196.0 lb

## 2024-05-26 DIAGNOSIS — Z8673 Personal history of transient ischemic attack (TIA), and cerebral infarction without residual deficits: Secondary | ICD-10-CM | POA: Diagnosis not present

## 2024-05-26 DIAGNOSIS — C662 Malignant neoplasm of left ureter: Secondary | ICD-10-CM | POA: Insufficient documentation

## 2024-05-26 DIAGNOSIS — I723 Aneurysm of iliac artery: Secondary | ICD-10-CM | POA: Insufficient documentation

## 2024-05-26 DIAGNOSIS — I4891 Unspecified atrial fibrillation: Secondary | ICD-10-CM | POA: Diagnosis not present

## 2024-05-26 DIAGNOSIS — Z01812 Encounter for preprocedural laboratory examination: Secondary | ICD-10-CM | POA: Insufficient documentation

## 2024-05-26 DIAGNOSIS — G4730 Sleep apnea, unspecified: Secondary | ICD-10-CM | POA: Insufficient documentation

## 2024-05-26 DIAGNOSIS — Z87891 Personal history of nicotine dependence: Secondary | ICD-10-CM | POA: Diagnosis not present

## 2024-05-26 DIAGNOSIS — Z86718 Personal history of other venous thrombosis and embolism: Secondary | ICD-10-CM | POA: Insufficient documentation

## 2024-05-26 DIAGNOSIS — I10 Essential (primary) hypertension: Secondary | ICD-10-CM | POA: Insufficient documentation

## 2024-05-26 HISTORY — DX: Unspecified macular degeneration: H35.30

## 2024-05-26 HISTORY — DX: Ventricular premature depolarization: I49.3

## 2024-05-26 HISTORY — DX: Unspecified malignant neoplasm of skin, unspecified: C44.90

## 2024-05-26 LAB — CBC
HCT: 39.5 % (ref 39.0–52.0)
Hemoglobin: 13.1 g/dL (ref 13.0–17.0)
MCH: 31.6 pg (ref 26.0–34.0)
MCHC: 33.2 g/dL (ref 30.0–36.0)
MCV: 95.4 fL (ref 80.0–100.0)
Platelets: 167 10*3/uL (ref 150–400)
RBC: 4.14 MIL/uL — ABNORMAL LOW (ref 4.22–5.81)
RDW: 14.2 % (ref 11.5–15.5)
WBC: 7.2 10*3/uL (ref 4.0–10.5)
nRBC: 0 % (ref 0.0–0.2)

## 2024-05-26 LAB — BASIC METABOLIC PANEL WITH GFR
Anion gap: 9 (ref 5–15)
BUN: 16 mg/dL (ref 8–23)
CO2: 27 mmol/L (ref 22–32)
Calcium: 8.8 mg/dL — ABNORMAL LOW (ref 8.9–10.3)
Chloride: 99 mmol/L (ref 98–111)
Creatinine, Ser: 0.93 mg/dL (ref 0.61–1.24)
GFR, Estimated: >60 mL/min (ref >60)
Glucose, Bld: 113 mg/dL — ABNORMAL HIGH (ref 70–99)
Potassium: 3.8 mmol/L (ref 3.5–5.1)
Sodium: 135 mmol/L (ref 135–145)

## 2024-05-26 NOTE — Progress Notes (Signed)
 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

## 2024-05-26 NOTE — Progress Notes (Signed)
 PCP - Denna Fish, MD at [facility] [phone]  Cardiologist -Janelle Mediate, MD     Chest x-ray - 09-02-2022  1v  Epic EKG -  11-26-2023  Epic Stress Test - 03/26/2018 ECHO - 12-28-2022  Epic Cardiac Cath - N/A CT Coronary Calcium  score:    Bowel Prep - [x]  No  []   Yes ______   Pacemaker / ICD device [x]  No []  Yes   Spinal Cord Stimulator:[x]  No []  Yes       History of Sleep Apnea? []  No [x]  Yes   CPAP used?- []  No [x]  Yes     Does the patient monitor blood sugar?   [x]  N/A   []  No []  Yes  Patient has: [x]  NO Hx DM   []  Pre-DM   []  DM1  []   DM2 Last A1c was:   5.6 normal on 08-26-2019       Blood Thinner / Instructions:Plavix  - already on hold pre Med. Reconn. Last dose June 3 8 AM Aspirin  Instructions: none know   ERAS Protocol Ordered: [x]  No  []  Yes Patient is to be NPO after: MN   Dental hx: []  Dentures:  [x]  N/A      []  Bridge or Partial:                   []  Loose or Damaged teeth:    Comments:    Activity level: Able to walk up 2 flights of stairs without becoming significantly short of breath or having chest pain?  []  No   [x]    Yes    Anesthesia review: OSA-CPAP, HTN, A.FIB, Hx TIA, GERD, glaucoma, Hx provoked DVT after surgery 2023.   Patient denies shortness of breath, fever, cough and chest pain at PAT appointment.   Patient verbalized understanding and agreement to the Pre-Surgical Instructions that were given to them at this PAT appointment. Patient was also educated of the need to review these PAT instructions again prior to his surgery.I reviewed the appropriat

## 2024-05-27 NOTE — Progress Notes (Signed)
 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 former smoker with h/o HTN, sleep apnea with cpap, PACs, TIA, atrial fibrillation, bradycardia, DVT, iliac artery aneurysm, left ureter cancer scheduled for above procedure 05/29/2024 with Dr. Osborn Blaze.   Pt last seen by cardiology 11/26/2023, stable at this visit.   2024 TTE - EF 60-65%. The right ventricular size is moderately enlarged. Trivial mitral valve regurgitation. Aortic valve sclerosis/calcification is present, without any evidence  of aortic stenosis.   VS: BP (!) 155/72   Pulse (!) 53   Temp 36.6 C (Oral)   Resp 18   Ht 6\' 1"  (1.854 m)   Wt 88.9 kg   SpO2 97%   BMI 25.86 kg/m   PROVIDERS: Ransom Byers, MD is PCP   Cardiologist -Janelle Mediate, MD   LABS: Labs reviewed: Acceptable for surgery. (all labs ordered are listed, but only abnormal results are displayed)  Labs Reviewed  BASIC METABOLIC PANEL WITH GFR - Abnormal; Notable for the following components:      Result Value   Glucose, Bld 113 (*)    Calcium  8.8 (*)    All other components within normal limits  CBC - Abnormal; Notable for the following components:   RBC 4.14 (*)    All other components within normal limits     IMAGES:   EKG:   CV: Echo 12/28/2022  1. Left ventricular ejection fraction, by estimation, is 60 to 65%. The  left ventricle has normal function. The left ventricle has no regional  wall motion abnormalities. Left ventricular diastolic parameters are  indeterminate. Elevated left ventricular  end-diastolic pressure.   2. Right ventricular systolic  function is normal. The right ventricular  size is moderately enlarged.   3. The mitral valve is normal in structure. Trivial mitral valve  regurgitation. No evidence of mitral stenosis.   4. The aortic valve is tricuspid. There is moderate calcification of the  aortic valve. There is moderate thickening of the aortic valve. Aortic  valve regurgitation is not visualized. Aortic valve  sclerosis/calcification is present, without any evidence  of aortic stenosis.   5. The inferior vena cava is normal in size with greater than 50%  respiratory variability, suggesting right atrial pressure of 3 mmHg.   Myocardial Perfusion 03/26/2018 Nuclear stress EF: 71%. The study is normal. This is a low risk study. The left ventricular ejection fraction is hyperdynamic (>65%). There was no ST segment deviation noted during stress. BP did drop in Stage 3 but likely erroneous as he had a hypertensive BP response in recovery. Past Medical History:  Diagnosis Date   Atrial fibrillation (HCC)    Blood clot in vein    2023 after surgery, dvt left leg, eliquis  x 6 months   Bradycardia    Cancer (HCC)    bladder   Cardiac murmur    GERD (gastroesophageal reflux disease)    Glaucoma    Heart palpitations    Hyperlipidemia    Hypertension    Iliac artery aneurysm (HCC)    Right   Macular degeneration    Numbness 08/26/2019  LEFT FACE   Pneumonia    yrs ago   PVC (premature ventricular contraction)    Skin abnormalities    pre cancerous lesion R hand   Skin cancer    Sleep apnea    uses cpap   Snoring     Past Surgical History:  Procedure Laterality Date   APPENDECTOMY  12/19/1963   BIOPSY  10/10/2023   Procedure: BIOPSY;  Surgeon: Normie Becton., MD;  Location: [facility];  Service: Gastroenterology;;   CHOLECYSTECTOMY N/A 10/11/2023   Procedure: LAPAROSCOPIC CHOLECYSTECTOMY;  Surgeon: Shela Derby, MD;  Location: [facility];  Service: General;  Laterality: N/A;   COLONOSCOPY      CYSTOSCOPY WITH BIOPSY N/A 05/16/2022   Procedure: CYSTOSCOPY WITH BIOPSY;  Surgeon: Homero Luster, MD;  Location: [facility];  Service: Urology;  Laterality: N/A;   CYSTOSCOPY WITH HOLMIUM LASER LITHOTRIPSY Left 11/02/2023   Procedure: HOLMIUM LASER ABLATION OF LEFT RENAL TUMOR;  Surgeon: Melody Spurling., MD;  Location: [facility];  Service: Urology;  Laterality: Left;   CYSTOSCOPY WITH RETROGRADE PYELOGRAM, URETEROSCOPY AND STENT PLACEMENT Bilateral 05/16/2022   Procedure: CYSTOSCOPY WITH BILATERAL RETROGRADE PYELOGRAM, LEFT URETEROSCOPY WITH BIOPSY, HOLMIUM LASER OF LEFT URETERAL STONE  AND LEFT STENT EXCHANGE, TUR LEFT URETERAL ORIFACE;  Surgeon: Homero Luster, MD;  Location[facility];  Service: Urology;  Laterality: Bilateral;  60 MINUTES   CYSTOSCOPY WITH RETROGRADE PYELOGRAM, URETEROSCOPY AND STENT PLACEMENT Left 07/26/2022   Procedure: CYSTOSCOPY WITH RETROGRADE PYELOGRAM, URETEROSCOPY AND STENT EXCHANGE;  Surgeon: Osborn Blaze, MD;  [facility];  Service: Urology;  Laterality: Left;   CYSTOSCOPY WITH RETROGRADE PYELOGRAM, URETEROSCOPY AND STENT PLACEMENT Bilateral 11/02/2023   Procedure: CYSTOSCOPY WITH BILATERAL RETROGRADE PYELOGRAM, AND LEFT DIAGNOSTIC URETEROSCOPY;  Surgeon: Melody Spurling., MD;  Location: [facility];  Service: Urology;  Laterality: Bilateral;   ERCP N/A 10/10/2023   Procedure: ENDOSCOPIC RETROGRADE CHOLANGIOPANCREATOGRAPHY (ERCP);  Surgeon: Normie Becton., MD;  Location: [facility];  Service: Gastroenterology;  Laterality: N/A;   LYMPH NODE DISSECTION Bilateral 07/26/2022   Procedure: LYMPH NODE DISSECTION;  Surgeon: Osborn Blaze, MD;  [facility];  Service: Urology;  Laterality: Bilateral;   POLYPECTOMY     REMOVAL OF STONES  10/10/2023   Procedure: REMOVAL OF SLUDGE;  Surgeon: Brice Campi Albino Alu., MD;  [facility];  Service: Gastroenterology;;   SKIN CANCER EXCISION  04/2023   mohs  procedure right leg   SPHINCTEROTOMY  10/10/2023   Procedure: SPHINCTEROTOMY;  Surgeon: Mansouraty, Albino Alu., MD;  Location: [facility];  Service: Gastroenterology;;   TONSILLECTOMY     as a child x2   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   Procedure: RESTAGING TRANSURETHRAL RESECTION OF BLADDER TUMOR  (TURBT) LEFT URETEROSCOPY WITH LASER LEFT STENT EXCHANGE RIGHT STENT REMOVAL;  Surgeon: Homero Luster, MD;  [facility];  Service: Urology;  Laterality: Bilateral;   TRANSURETHRAL RESECTION OF BLADDER TUMOR WITH MITOMYCIN -C Bilateral 01/03/2022   Procedure: CYSTOSCOPY TRANSURETHRAL RESECTION OF BLADDER TUMOR WITH POSSIBLE  BILATERAL RETROGRADES STENT PLACEMENT;  Surgeon: Homero Luster, MD;  [facility];  Service: Urology;  Laterality: Bilateral;   WISDOM TOOTH EXTRACTION      MEDICATIONS:  acetaminophen  (TYLENOL ) 500 MG tablet   Apoaequorin (PREVAGEN EXTRA STRENGTH) 20 MG CAPS   atorvastatin  (LIPITOR ) 20 MG tablet   Cholecalciferol 25 MCG (1000 UT) capsule   clopidogrel  (PLAVIX ) 75 MG tablet   Coenzyme Q-10 200 MG CAPS   COSOPT  PF 2-0.5 %  SOLN ophthalmic solution   CRANBERRY PO   cyanocobalamin (VITAMIN B12) 1000 MCG tablet   diltiazem  (DILT-XR) 240 MG 24 hr capsule   docusate sodium  (COLACE) 100 MG capsule   esomeprazole  (NEXIUM ) 20 MG capsule   fluticasone  (FLONASE ) 50 MCG/ACT nasal spray   losartan -hydrochlorothiazide  (HYZAAR) 100-25 MG tablet   Melatonin 3 MG CAPS   Misc Natural Products (PROSTATE HEALTH) CAPS   Multiple Vitamins-Minerals (ADULT GUMMY PO)   Multiple Vitamins-Minerals (PRESERVISION AREDS 2+MULTI VIT) CAPS   nitroGLYCERIN  (NITROSTAT ) 0.4 MG SL tablet   NON FORMULARY   Omega-3 Fatty Acids (FISH OIL) 1200 MG CAPS   OVER THE COUNTER MEDICATION   Probiotic Product (ALIGN PO)   RHOPRESSA  0.02 % SOLN   tadalafil  (CIALIS ) 5 MG tablet   tamsulosin  (FLOMAX ) 0.4 MG CAPS capsule   valACYclovir (VALTREX) 1000 MG tablet   Zinc 25 MG TABS    No current facility-administered medications for this encounter.   Chick Cotton Ward, PA-C [facility] [phone]

## 2024-05-28 DIAGNOSIS — H353223 Exudative age-related macular degeneration, left eye, with inactive scar: Secondary | ICD-10-CM | POA: Diagnosis not present

## 2024-05-28 DIAGNOSIS — H43813 Vitreous degeneration, bilateral: Secondary | ICD-10-CM | POA: Diagnosis not present

## 2024-05-28 DIAGNOSIS — H353211 Exudative age-related macular degeneration, right eye, with active choroidal neovascularization: Secondary | ICD-10-CM | POA: Diagnosis not present

## 2024-05-28 DIAGNOSIS — H35371 Puckering of macula, right eye: Secondary | ICD-10-CM | POA: Diagnosis not present

## 2024-05-29 ENCOUNTER — Encounter (HOSPITAL_COMMUNITY)
Admission: RE | Disposition: A | Discharge status: Home / Self Care | Payer: Self-pay | Source: Home / Self Care | Attending: Urology

## 2024-05-29 ENCOUNTER — Ambulatory Visit (HOSPITAL_COMMUNITY): Admitting: Anesthesiology

## 2024-05-29 ENCOUNTER — Ambulatory Visit (HOSPITAL_COMMUNITY): Payer: Self-pay | Admitting: Physician Assistant

## 2024-05-29 ENCOUNTER — Ambulatory Visit (HOSPITAL_COMMUNITY)

## 2024-05-29 ENCOUNTER — Encounter (HOSPITAL_COMMUNITY): Payer: Self-pay | Admitting: Urology

## 2024-05-29 ENCOUNTER — Other Ambulatory Visit: Payer: Self-pay

## 2024-05-29 ENCOUNTER — Ambulatory Visit (HOSPITAL_COMMUNITY)
Admission: RE | Admit: 2024-05-29 | Discharge: 2024-05-29 | Disposition: A | Discharge status: Home / Self Care | Attending: Urology | Admitting: Urology

## 2024-05-29 DIAGNOSIS — C679 Malignant neoplasm of bladder, unspecified: Secondary | ICD-10-CM | POA: Diagnosis not present

## 2024-05-29 DIAGNOSIS — Z8554 Personal history of malignant neoplasm of ureter: Secondary | ICD-10-CM | POA: Insufficient documentation

## 2024-05-29 DIAGNOSIS — G4730 Sleep apnea, unspecified: Secondary | ICD-10-CM | POA: Insufficient documentation

## 2024-05-29 DIAGNOSIS — C662 Malignant neoplasm of left ureter: Secondary | ICD-10-CM | POA: Diagnosis not present

## 2024-05-29 DIAGNOSIS — Z7902 Long term (current) use of antithrombotics/antiplatelets: Secondary | ICD-10-CM | POA: Diagnosis not present

## 2024-05-29 DIAGNOSIS — I10 Essential (primary) hypertension: Secondary | ICD-10-CM | POA: Insufficient documentation

## 2024-05-29 DIAGNOSIS — R338 Other retention of urine: Secondary | ICD-10-CM | POA: Diagnosis not present

## 2024-05-29 DIAGNOSIS — K219 Gastro-esophageal reflux disease without esophagitis: Secondary | ICD-10-CM | POA: Diagnosis not present

## 2024-05-29 DIAGNOSIS — Z87891 Personal history of nicotine dependence: Secondary | ICD-10-CM | POA: Insufficient documentation

## 2024-05-29 DIAGNOSIS — I4891 Unspecified atrial fibrillation: Secondary | ICD-10-CM | POA: Diagnosis not present

## 2024-05-29 DIAGNOSIS — C642 Malignant neoplasm of left kidney, except renal pelvis: Secondary | ICD-10-CM | POA: Diagnosis not present

## 2024-05-29 DIAGNOSIS — C670 Malignant neoplasm of trigone of bladder: Secondary | ICD-10-CM | POA: Diagnosis not present

## 2024-05-29 DIAGNOSIS — Z85828 Personal history of other malignant neoplasm of skin: Secondary | ICD-10-CM | POA: Insufficient documentation

## 2024-05-29 HISTORY — PX: TRANSURETHRAL RESECTION OF BLADDER TUMOR: SHX2575

## 2024-05-29 HISTORY — PX: CYSTOSCOPY W/ RETROGRADES: SHX1426

## 2024-05-29 HISTORY — PX: CYSTOSCOPY/URETEROSCOPY/HOLMIUM LASER/STENT PLACEMENT: SHX6546

## 2024-05-29 SURGERY — CYSTOSCOPY/URETEROSCOPY/HOLMIUM LASER/STENT PLACEMENT
Anesthesia: General | Laterality: Left

## 2024-05-29 MED ORDER — OXYCODONE HCL 5 MG/5ML PO SOLN: 5.0000 mg | Freq: Once | ORAL | Status: DC | PRN

## 2024-05-29 MED ORDER — SODIUM CHLORIDE 0.9 % IR SOLN
Status: DC | PRN
  Administered 2024-05-29: 3000 mL via INTRAVESICAL

## 2024-05-29 MED ORDER — ORAL CARE MOUTH RINSE
15.0000 mL | Freq: Once | OROMUCOSAL | Status: AC
Start: 2024-05-29 — End: 2024-05-29

## 2024-05-29 MED ORDER — IOHEXOL 300 MG/ML  SOLN
INTRAMUSCULAR | Status: DC | PRN
Start: 2024-05-29 — End: 2024-05-30
  Administered 2024-05-29: 47 mL

## 2024-05-29 MED ORDER — DEXAMETHASONE SODIUM PHOSPHATE 10 MG/ML IJ SOLN
INTRAMUSCULAR | Status: AC
Start: 2024-05-29 — End: 2024-05-29
  Filled 2024-05-29: qty 1

## 2024-05-29 MED ORDER — ACETAMINOPHEN 10 MG/ML IV SOLN: 1000.0000 mg | Freq: Once | INTRAVENOUS | Status: DC | PRN

## 2024-05-29 MED ORDER — FENTANYL CITRATE (PF) 100 MCG/2ML IJ SOLN
INTRAMUSCULAR | Status: AC
Start: 2024-05-29 — End: 2024-05-29
  Filled 2024-05-29: qty 2

## 2024-05-29 MED ORDER — FENTANYL CITRATE PF 50 MCG/ML IJ SOSY: 25.0000 ug | PREFILLED_SYRINGE | INTRAMUSCULAR | Status: DC | PRN

## 2024-05-29 MED ORDER — OXYCODONE HCL 5 MG PO TABS: 5.0000 mg | ORAL_TABLET | Freq: Once | ORAL | Status: DC | PRN

## 2024-05-29 MED ORDER — CHLORHEXIDINE GLUCONATE 0.12 % MT SOLN
15.0000 mL | Freq: Once | OROMUCOSAL | Status: AC
  Administered 2024-05-29: 15 mL via OROMUCOSAL

## 2024-05-29 MED ORDER — LIDOCAINE 2% (20 MG/ML) 5 ML SYRINGE
INTRAMUSCULAR | Status: DC | PRN
Start: 2024-05-29 — End: 2024-05-29
  Administered 2024-05-29: 100 mg via INTRAVENOUS

## 2024-05-29 MED ORDER — DEXAMETHASONE SODIUM PHOSPHATE 10 MG/ML IJ SOLN
INTRAMUSCULAR | Status: DC | PRN
  Administered 2024-05-29: 5 mg via INTRAVENOUS

## 2024-05-29 MED ORDER — GENTAMICIN SULFATE 40 MG/ML IJ SOLN
5.0000 mg/kg | INTRAVENOUS | Status: AC
  Administered 2024-05-29: 440 mg via INTRAVENOUS
  Filled 2024-05-29: qty 11

## 2024-05-29 MED ORDER — EPHEDRINE 5 MG/ML INJ
INTRAVENOUS | Status: AC
  Filled 2024-05-29: qty 5

## 2024-05-29 MED ORDER — ONDANSETRON HCL 4 MG/2ML IJ SOLN: 4.0000 mg | Freq: Once | INTRAMUSCULAR | Status: DC | PRN

## 2024-05-29 MED ORDER — ONDANSETRON HCL 4 MG/2ML IJ SOLN
INTRAMUSCULAR | Status: AC
  Filled 2024-05-29: qty 2

## 2024-05-29 MED ORDER — LACTATED RINGERS IV SOLN: INTRAVENOUS | Status: DC

## 2024-05-29 MED ORDER — FENTANYL CITRATE (PF) 250 MCG/5ML IJ SOLN
INTRAMUSCULAR | Status: DC | PRN
  Administered 2024-05-29 (×2): 50 ug via INTRAVENOUS

## 2024-05-29 MED ORDER — EPHEDRINE SULFATE-NACL 50-0.9 MG/10ML-% IV SOSY
PREFILLED_SYRINGE | INTRAVENOUS | Status: DC | PRN
  Administered 2024-05-29 (×2): 5 mg via INTRAVENOUS

## 2024-05-29 MED ORDER — PROPOFOL 10 MG/ML IV BOLUS
INTRAVENOUS | Status: DC | PRN
  Administered 2024-05-29: 150 mg via INTRAVENOUS

## 2024-05-29 MED ORDER — SENNOSIDES-DOCUSATE SODIUM 8.6-50 MG PO TABS: 1.0000 | ORAL_TABLET | Freq: Two times a day (BID) | ORAL | 0 refills | Status: AC

## 2024-05-29 MED ORDER — ONDANSETRON HCL 4 MG/2ML IJ SOLN
INTRAMUSCULAR | Status: DC | PRN
  Administered 2024-05-29: 4 mg via INTRAVENOUS

## 2024-05-29 MED ORDER — TRAMADOL HCL 50 MG PO TABS: 50.0000 mg | ORAL_TABLET | Freq: Four times a day (QID) | ORAL | 0 refills | Status: DC | PRN

## 2024-05-29 SURGICAL SUPPLY — 21 items
BAG URO CATCHER STRL LF (MISCELLANEOUS) ×3 IMPLANT
BASKET LASER NITINOL 1.9FR (BASKET) IMPLANT
CATH URETL OPEN END 6FR 70 (CATHETERS) ×3 IMPLANT
CLOTH BEACON ORANGE TIMEOUT ST (SAFETY) ×3 IMPLANT
GLOVE SURG LX STRL 7.5 STRW (GLOVE) ×3 IMPLANT
GOWN STRL REUS W/ TWL XL LVL3 (GOWN DISPOSABLE) ×3 IMPLANT
GUIDEWIRE ANG ZIPWIRE 038X150 (WIRE) ×3 IMPLANT
GUIDEWIRE STR DUAL SENSOR (WIRE) ×3 IMPLANT
KIT TURNOVER KIT A (KITS) IMPLANT
LOOP CUT BIPOLAR 24F LRG (ELECTROSURGICAL) IMPLANT
MANIFOLD NEPTUNE II (INSTRUMENTS) ×3 IMPLANT
NS IRRIG 1000ML POUR BTL (IV SOLUTION) IMPLANT
PACK CYSTO (CUSTOM PROCEDURE TRAY) ×3 IMPLANT
SHEATH NAVIGATOR HD 11/13X28 (SHEATH) IMPLANT
SHEATH NAVIGATOR HD 11/13X36 (SHEATH) IMPLANT
STENT URET 6FRX22 CONTOUR (STENTS) IMPLANT
TRACTIP FLEXIVA PULS ID 200XHI (Laser) IMPLANT
TRACTIP FLEXIVA PULSE ID 200 (Laser) IMPLANT
TUBE PU 8FR 16IN ENFIT (TUBING) ×3 IMPLANT
TUBING CONNECTING 10 (TUBING) ×3 IMPLANT
TUBING UROLOGY SET (TUBING) ×3 IMPLANT

## 2024-05-29 NOTE — Anesthesia Preprocedure Evaluation (Addendum)
 Anesthesia Evaluation  Patient identified by MRN, date of birth, ID band Patient awake    Reviewed: Allergy & Precautions, NPO status , Patient's Chart, lab work & pertinent test results, reviewed documented beta blocker date and time   History of Anesthesia Complications Negative for: history of anesthetic complications  Airway Mallampati: II  TM Distance: >3 FB     Dental no notable dental hx.    Pulmonary sleep apnea and Continuous Positive Airway Pressure Ventilation , pneumonia, former smoker   breath sounds clear to auscultation       Cardiovascular hypertension, (-) CAD and (-) Past MI + Valvular Problems/Murmurs  Rhythm:Regular Rate:Normal  IMPRESSIONS     1. Left ventricular ejection fraction, by estimation, is 60 to 65%. The  left ventricle has normal function. The left ventricle has no regional  wall motion abnormalities. Left ventricular diastolic parameters are  indeterminate. Elevated left ventricular  end-diastolic pressure.   2. Right ventricular systolic function is normal. The right ventricular  size is moderately enlarged.   3. The mitral valve is normal in structure. Trivial mitral valve  regurgitation. No evidence of mitral stenosis.   4. The aortic valve is tricuspid. There is moderate calcification of the  aortic valve. There is moderate thickening of the aortic valve. Aortic  valve regurgitation is not visualized. Aortic valve  sclerosis/calcification is present, without any evidence  of aortic stenosis.   5. The inferior vena cava is normal in size with greater than 50%  respiratory variability, suggesting right atrial pressure of 3 mmHg.      Neuro/Psych neg Seizures TIA   GI/Hepatic ,GERD  ,,(+) neg Cirrhosis        Endo/Other    Renal/GU Renal disease     Musculoskeletal   Abdominal   Peds  Hematology   Anesthesia Other Findings   Reproductive/Obstetrics                              Anesthesia Physical Anesthesia Plan  ASA: 2  Anesthesia Plan: General   Post-op Pain Management:    Induction: Intravenous  PONV Risk Score and Plan: 2 and Ondansetron   Airway Management Planned: LMA  Additional Equipment:   Intra-op Plan:   Post-operative Plan: Extubation in OR  Informed Consent: I have reviewed the patients History and Physical, chart, labs and discussed the procedure including the risks, benefits and alternatives for the proposed anesthesia with the patient or authorized representative who has indicated his/her understanding and acceptance.     Dental advisory given  Plan Discussed with: CRNA  Anesthesia Plan Comments:         Anesthesia Quick Evaluation

## 2024-05-29 NOTE — Progress Notes (Signed)
 Per Dr.Manny, to place foley catheter and discharge patient home with foley catheter. if patient is unable to pee and bladder scan showed more than . Bladder scan showed , foley catheter placed and received in dark red urine. Leg bag also given to patient to take home. Verbal order placed.

## 2024-05-29 NOTE — Anesthesia Postprocedure Evaluation (Signed)
 Anesthesia Post Note  Patient: Jonathan Lane  Procedure(s) Performed: CYSTOSCOPY/URETEROSCOPY/HOLMIUM LASER/STENT PLACEMENT (Left) CYSTOSCOPY, WITH RETROGRADE PYELOGRAM (Bilateral) TURBT (TRANSURETHRAL RESECTION OF BLADDER TUMOR)     Patient location during evaluation: PACU Anesthesia Type: General Level of consciousness: awake and alert Pain management: pain level controlled Vital Signs Assessment: post-procedure vital signs reviewed and stable Respiratory status: spontaneous breathing, nonlabored ventilation and respiratory function stable Cardiovascular status: blood pressure returned to baseline and stable Postop Assessment: no apparent nausea or vomiting Anesthetic complications: no   No notable events documented.  Last Vitals:  Vitals:   05/29/24 1400 05/29/24 1414  BP: (!) 148/73 (!) 153/74  Pulse: (!) 51 (!) 54  Resp: 14 16  Temp: (!) 36.4 C (!) 36.4 C  SpO2: 95% 96%    Last Pain:  Vitals:   05/29/24 1414  TempSrc:   PainSc: 0-No pain                 Cira Deyoe

## 2024-05-29 NOTE — Anesthesia Procedure Notes (Signed)
 Procedure Name: LMA Insertion Date/Time: 05/29/2024 12:33 PM  Performed by: Linard Reno, CRNAPre-anesthesia Checklist: Patient identified, Emergency Drugs available, Suction available and Patient being monitored Patient Re-evaluated:Patient Re-evaluated prior to induction Oxygen Delivery Method: Circle System Utilized Preoxygenation: Pre-oxygenation with 100% oxygen Induction Type: IV induction Ventilation: Mask ventilation without difficulty LMA: LMA inserted LMA Size: 5.0 Number of attempts: 1 Airway Equipment and Method: Bite block Placement Confirmation: positive ETCO2 Tube secured with: Tape Dental Injury: Teeth and Oropharynx as per pre-operative assessment

## 2024-05-29 NOTE — Transfer of Care (Signed)
 Immediate Anesthesia Transfer of Care Note  Patient: Jonathan Lane  Procedure(s) Performed: CYSTOSCOPY/URETEROSCOPY/HOLMIUM LASER/STENT PLACEMENT (Left) CYSTOSCOPY, WITH RETROGRADE PYELOGRAM (Bilateral) TURBT (TRANSURETHRAL RESECTION OF BLADDER TUMOR)  Patient Location: PACU  Anesthesia Type:General  Level of Consciousness: awake, alert , and oriented  Airway & Oxygen Therapy: Patient Spontanous Breathing  Post-op Assessment: Report given to RN and Post -op Vital signs reviewed and stable  Post vital signs: Reviewed and stable  Last Vitals:  Vitals Value Taken Time  BP 148/73 05/29/24 14:00  Temp 36.2 C 05/29/24 13:37  Pulse 50 05/29/24 14:02  Resp 9 05/29/24 14:02  SpO2 95 % 05/29/24 14:02  Vitals shown include unfiled device data.  Last Pain:  Vitals:   05/29/24 1345  TempSrc:   PainSc: 0-No pain         Complications: No notable events documented.

## 2024-05-29 NOTE — Discharge Instructions (Addendum)
 1 - You may have urinary urgency (bladder spasms) and bloody urine on / off with stent in place. This is normal.  2 - Restart plavix  when no visible blood in urine for 48 hours.   3 - Call MD or go to ER for fever >102, severe pain / nausea / vomiting not relieved by medications, or acute change in medical status

## 2024-05-29 NOTE — H&P (Signed)
 Jonathan Lane is an 83 y.o. male.    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 His partern [name] (met after his wife passed around 2021) is very involved. He is retired from [employer] for [employer], [place].   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 Has had some recent hematuria, now holding plavix . Most recent UA without infectious parameters.   Past Medical History:  Diagnosis Date   Atrial fibrillation (HCC)    Blood clot in vein    2023 after surgery, dvt left leg, eliquis  x 6 months   Bradycardia    Cancer (HCC)    bladder   Cardiac murmur    GERD (gastroesophageal reflux disease)    Glaucoma    Heart palpitations    Hyperlipidemia    Hypertension    Iliac artery aneurysm (HCC)    Right   Macular degeneration    Numbness 08/26/2019   LEFT FACE   Pneumonia    yrs ago   PVC (premature ventricular contraction)    Skin abnormalities    pre cancerous lesion R hand   Skin cancer    Sleep apnea    uses cpap   Snoring     Past Surgical History:  Procedure Laterality Date   APPENDECTOMY  12/19/1963   BIOPSY  10/10/2023   Procedure: BIOPSY;  Surgeon: Normie Becton., MD;  Location: [facility];  Service: Gastroenterology;;   CHOLECYSTECTOMY N/A 10/11/2023   Procedure: LAPAROSCOPIC CHOLECYSTECTOMY;  Surgeon: Shela Derby, MD;  [facility];  Service:  General;  Laterality: N/A;   COLONOSCOPY     CYSTOSCOPY WITH BIOPSY N/A 05/16/2022   Procedure: CYSTOSCOPY WITH BIOPSY;  Surgeon: Homero Luster, MD;  Location: [facility];  Service: Urology;  Laterality: N/A;   CYSTOSCOPY WITH HOLMIUM LASER LITHOTRIPSY Left 11/02/2023   Procedure: HOLMIUM LASER ABLATION OF LEFT RENAL TUMOR;  Surgeon: Melody Spurling., MD;  Location: [facility];  Service: Urology;  Laterality: Left;   CYSTOSCOPY WITH RETROGRADE PYELOGRAM, URETEROSCOPY AND STENT PLACEMENT Bilateral 05/16/2022   Procedure: CYSTOSCOPY WITH BILATERAL RETROGRADE PYELOGRAM, LEFT URETEROSCOPY WITH BIOPSY, HOLMIUM LASER OF LEFT URETERAL STONE  AND LEFT STENT EXCHANGE, TUR LEFT URETERAL ORIFACE;  Surgeon: Homero Luster, MD;  Location: [facility];  Service: Urology;  Laterality: Bilateral;  60 MINUTES   CYSTOSCOPY WITH RETROGRADE PYELOGRAM, URETEROSCOPY AND STENT PLACEMENT Left 07/26/2022   Procedure: CYSTOSCOPY WITH RETROGRADE PYELOGRAM, URETEROSCOPY AND STENT EXCHANGE;  Surgeon: Osborn Blaze, MD;  Location: [facility];  Service: Urology;  Laterality: Left;   CYSTOSCOPY WITH RETROGRADE PYELOGRAM, URETEROSCOPY AND STENT PLACEMENT Bilateral 11/02/2023   Procedure: CYSTOSCOPY WITH BILATERAL RETROGRADE PYELOGRAM, AND LEFT DIAGNOSTIC URETEROSCOPY;  Surgeon: Melody Spurling., MD;  Location: [facility];  Service: Urology;  Laterality: Bilateral;   ERCP N/A 10/10/2023   Procedure: ENDOSCOPIC RETROGRADE CHOLANGIOPANCREATOGRAPHY (ERCP);  Surgeon: Brice Campi Albino Alu., MD;  Location: [facility];  Service:  Gastroenterology;  Laterality: N/A;   LYMPH NODE DISSECTION Bilateral 07/26/2022   Procedure: LYMPH NODE DISSECTION;  Surgeon: Osborn Blaze, MD;  Location: [facility];  Service: Urology;  Laterality: Bilateral;   POLYPECTOMY     REMOVAL OF STONES  10/10/2023   Procedure: REMOVAL OF SLUDGE;  Surgeon: Brice Campi Albino Alu., MD;  [facility];  Service: Gastroenterology;;    SKIN CANCER EXCISION  04/2023   mohs procedure right leg   SPHINCTEROTOMY  10/10/2023   Procedure: SPHINCTEROTOMY;  Surgeon: Mansouraty, Albino Alu., MD;  Location: [facility];  Service: Gastroenterology;;   TONSILLECTOMY     as a child x2   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   Procedure: RESTAGING TRANSURETHRAL RESECTION OF BLADDER TUMOR  (TURBT) LEFT URETEROSCOPY WITH LASER LEFT STENT EXCHANGE RIGHT STENT REMOVAL;  Surgeon: Homero Luster, MD;  Location: [facility];  Service: Urology;  Laterality: Bilateral;   TRANSURETHRAL RESECTION OF BLADDER TUMOR WITH MITOMYCIN -C Bilateral 01/03/2022   Procedure: CYSTOSCOPY TRANSURETHRAL RESECTION OF BLADDER TUMOR WITH POSSIBLE  BILATERAL RETROGRADES STENT PLACEMENT;  Surgeon: Homero Luster, MD;  Location: [facility];  Service: Urology;  Laterality: Bilateral;   WISDOM TOOTH EXTRACTION      Family History  Problem Relation Age of Onset   Stroke Father    Stroke Mother    Stroke Sister    Breast cancer Sister    High blood pressure Sister    Diabetes Sister    Colon cancer Other        Uncle   Colon cancer Paternal Uncle    Mitral valve prolapse Other        sugery 09-27-2017   Rectal cancer Neg Hx    Stomach cancer Neg Hx    Social History:  reports that he quit smoking about 58 years ago. His smoking use included cigarettes. He has never been exposed to tobacco smoke. He has never used smokeless tobacco. He reports that he does not currently use alcohol. He reports that he does not use drugs.  Allergies:  Allergies  Allergen Reactions   Brimonidine Tartrate-Timolol  Itching, Rash and Other (See Comments)    Itching eyes Other reaction(s): Not available   Brimonidine Tartrate Other (See Comments)    Irritates the eyes and increased pressure   Tetracycline Hcl Other (See Comments)    Doesn't remember reaction-like 55 years ago    Ciprofloxacin Other (See Comments)    Constipation    Medications Prior to Admission   Medication Sig Dispense Refill   acetaminophen  (TYLENOL ) 500 MG tablet Take 500 mg by mouth every 8 (eight) hours as needed for moderate pain (pain score 4-6) or mild pain (pain score 1-3).     Apoaequorin (PREVAGEN EXTRA STRENGTH) 20 MG CAPS Take 1 capsule by mouth daily.     atorvastatin  (LIPITOR ) 20 MG tablet Take 1 tablet (20 mg total) by mouth daily. (Patient taking differently: Take 20 mg by mouth at bedtime.) 30 tablet 0   Cholecalciferol 25 MCG (1000 UT) capsule Take 1,000 Units by mouth daily.     clopidogrel  (PLAVIX ) 75 MG tablet Take 75 mg by mouth daily.     Coenzyme Q-10 200 MG CAPS Take 200 mg by mouth daily.     COSOPT  PF 2-0.5 % SOLN ophthalmic solution Place 1 drop into both eyes in the morning and at bedtime.     CRANBERRY PO Take 650 mg by mouth daily.     cyanocobalamin (VITAMIN B12) 1000 MCG tablet Take 1,000 mcg by  mouth daily.     diltiazem  (DILT-XR) 240 MG 24 hr capsule Take 1 capsule (240 mg total) by mouth daily. (Patient taking differently: Take 240 mg by mouth at bedtime.) 90 capsule 2   docusate sodium  (COLACE) 100 MG capsule Take 1 capsule (100 mg total) by mouth 2 (two) times daily. (Patient taking differently: Take 200 mg by mouth daily.)     esomeprazole  (NEXIUM ) 20 MG capsule Take 20 mg by mouth 2 (two) times daily before a meal.     fluticasone  (FLONASE ) 50 MCG/ACT nasal spray Place 2 sprays into both nostrils daily as needed for allergies or rhinitis.     losartan -hydrochlorothiazide  (HYZAAR) 100-25 MG tablet Take 1 tablet by mouth daily. (Patient taking differently: Take 1 tablet by mouth at bedtime.) 90 tablet 2   Melatonin 3 MG CAPS Take 6 capsules by mouth at bedtime.     Misc Natural Products (PROSTATE HEALTH) CAPS Take 1 capsule by mouth daily.     Multiple Vitamins-Minerals (ADULT GUMMY PO) Take 2 capsules by mouth daily.     Multiple Vitamins-Minerals (PRESERVISION AREDS 2+MULTI VIT) CAPS Take 1 capsule by mouth 2 times daily at 12 noon and 4 pm.      nitroGLYCERIN  (NITROSTAT ) 0.4 MG SL tablet Place 1 tablet (0.4 mg total) under the tongue every 5 (five) minutes as needed for chest pain. 10 tablet 1   NON FORMULARY Pt uses a cpap nightly     Omega-3 Fatty Acids (FISH OIL) 1200 MG CAPS Take 1,200 mg by mouth 2 (two) times daily.     OVER THE COUNTER MEDICATION Take 25 mg by mouth daily. Saffron 28 mg     Probiotic Product (ALIGN PO) Take 1 capsule by mouth in the morning.     RHOPRESSA  0.02 % SOLN Place 1 drop into both eyes at bedtime.      tadalafil  (CIALIS ) 5 MG tablet Take 5 mg by mouth daily.     tamsulosin  (FLOMAX ) 0.4 MG CAPS capsule Take 1 capsule (0.4 mg total) by mouth daily. 30 capsule 0   valACYclovir (VALTREX) 1000 MG tablet Take 1,000 mg by mouth 2 (two) times daily as needed (for outbreaks).     Zinc 25 MG TABS Take 25 mg by mouth daily.      No results found for this or any previous visit (from the past 48 hours). No results found.  Review of Systems  Constitutional:  Negative for chills and fever.  All other systems reviewed and are negative.   Blood pressure (!) 156/77, pulse 61, temperature 98.1 F (36.7 C), temperature source Oral, resp. rate 18, height 6' 1 (1.854 m), weight 88.9 kg, SpO2 96%. Physical Exam   Assessment/Plan  Proceed as planned with cysto, retrogrades, LEFT ureteroscopy / possible laser tumor ablation / stent. Risks, benefits, alternatives, expected peri-op course discussed previously and reiteratd today.   Melody Spurling., MD 05/29/2024, 11:27 AM

## 2024-05-29 NOTE — Brief Op Note (Signed)
 05/29/2024  1:22 PM  PATIENT:  Luane Rumps Nhan  83 y.o. male  PRE-OPERATIVE DIAGNOSIS:  LEFT URETER CANCER  POST-OPERATIVE DIAGNOSIS:  LEFT URETER CANCER + BLADDER CANCER  PROCEDURE:  Procedure(s): CYSTOSCOPY/URETEROSCOPY/HOLMIUM LASER/STENT PLACEMENT (Left) CYSTOSCOPY, WITH RETROGRADE PYELOGRAM (Bilateral) TURBT (TRANSURETHRAL RESECTION OF BLADDER TUMOR)  SURGEON:  Surgeons and Role:    * Manny, Harvey Linen., MD - Primary  PHYSICIAN ASSISTANT:   ASSISTANTS: none   ANESTHESIA:   general  EBL:  minimal   BLOOD ADMINISTERED:none  DRAINS: none   LOCAL MEDICATIONS USED:  NONE  SPECIMEN:  Source of Specimen:  bladder tumor  DISPOSITION OF SPECIMEN:  PATHOLOGY  COUNTS:  YES  TOURNIQUET:  * No tourniquets in log *  DICTATION: .Other Dictation: Dictation Number [id number]  PLAN OF CARE: Discharge to home after PACU  PATIENT DISPOSITION:  PACU - hemodynamically stable.   Delay start of Pharmacological VTE agent (>24hrs) due to surgical blood loss or risk of bleeding: yes

## 2024-05-30 ENCOUNTER — Encounter (HOSPITAL_COMMUNITY): Payer: Self-pay | Admitting: Urology

## 2024-06-02 LAB — SURGICAL PATHOLOGY

## 2024-06-02 NOTE — Op Note (Signed)
 NAME: Jonathan Lane, HEVENER MEDICAL RECORD NO: [id number] ACCOUNT NO: [id number] DATE OF BIRTH: 12/06/1941 FACILITY: [facility] LOCATION: [facility] PHYSICIAN: Osborn Blaze, MD  Operative Report   DATE OF PROCEDURE: 05/29/2024  PREOPERATIVE DIAGNOSES: History of left ureteral cancer, recurrent hematuria.  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  PROCEDURE PERFORMED: 1.  Cystoscopy, bilateral retrograde pyelograms, interpretation. 2.  Left ureteroscopy with laser tumor ablation. 3.  Placement of left ureteral stent. 4.  Transurethral resection of bladder tumor, volume medium.  ESTIMATED BLOOD LOSS:  10 mL  COMPLICATIONS:  None.  SPECIMEN: Bladder tumor for permanent pathology.  FINDINGS: 1.  Very, very small volume left lower pole urothelial tumor. This was completely ablated. 2.  Approximately 3 cm2 area of bladder tumor near the neo-left ureteral orifice. 3.  Successful replacement of left ureteral stent.  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 He has had prior bladder tumor that responded well to BCG therapy as well as left distal ureteral cancer. He is status post  left ureteral reimplant and distal ureterectomy some years ago. He is known to have occasional recurrences of left renal pelvis and ureteral tumor that has been amenable to endoscopic ablation, most recently approximately 6 months ago. He is on Plavix   for cardiac indication. He developed gross hematuria recently and he is essentially due for his surveillance.  Options were discussed including recommended path of operative evaluation, cysto, bilateral retrograde pyelogram, left ureteroscopy, and  possible laser ablation of tumor. He wished to proceed. Informed consent was obtained and placed in medical record.  DESCRIPTION OF PROCEDURE: The patient being himself verified, procedure being cysto, retrograde  pyelogram, left ureteroscopy was confirmed. Procedure timeout was performed. Intravenous antibiotics were administered. General LMA anesthesia was induced.  The patient was placed into a low lithotomy position. Sterile field was created, prepped and draped the patient's penis, perineum, and proximal thigh using iodine. Cystourethroscopy was performed using a 21-French rigid cystoscope with offset lens.  Inspection of the anterior and posterior urethra was unremarkable. Inspection of urinary bladder revealed unremarkable right hemi-bladder with right ureteral orifice in anatomic position. The left ureteral orifice had been previously resected with the  neo-orifice at the left bladder dome. There was unfortunately a significant amount of papillary tumor near this area, but not obviously obstructing it. Total surface area approximately 3 cm2.  The right ureteral orifice was cannulated with a 6-French  open-ended catheter, and right retrograde pyelogram was obtained.  Right retrograde pyelogram demonstrated single right ureter, single system right kidney.  No filling defects or narrowing noted. Next, left retrograde pyelogram was obtained.  Left retrograde pyelogram demonstrated single left ureter, single system left kidney.  No filling defects or narrowing noted. No hydronephrosis noted. A 0.038 ZIPwire was advanced to the level of the upper pole, set aside as a safety wire. An 8-French  feeding tube placed in the bladder for pressure release. A semirigid ureteroscopy was performed to the distal two-thirds of the left ureter alongside a separate sensor working wire. No mucosal abnormalities were found. Again, the area of papillary tumor  close to the left ureteral orifice was not obstructing. The semirigid scope was then exchanged for a short-length ureteral access sheath at the level of the mid-ureter using continuous fluoroscopic guidance and flexible digital ureteroscopy was performed  of the proximal  right ureter and inspection of the left kidney. As stated previously, there was a very small volume  of relatively low-grade-appearing papillary tumor in the lower pole. This was not obstructing. This was amenable to laser ablation.   Holmium laser energy was applied to the area of tumor using settings of 1 joule and 10 Hz using a defocused technique, and the area of papillary tumor was ablated in the left lower pole completely. The access sheath was removed under continuous vision,  no significant mucosal abnormalities were found.  A new 6 x 22 Contour tip stent was placed over safety wire using fluoroscopic guidance. Next, the 26-French resectoscope sheath was introduced, and using a resectoscope loop, the area of tumor was very  carefully resected near the area of the left ureteral orifice near the bladder of dome. Exquisite care was taken to avoid perforation in this sensitive area. This generated several bladder tumor fragments, which were irrigated and set aside for permanent  pathology and the base and edges of this were fulgurated. Given the sensitive dome area, it was not felt that deep sampling would be prudent. We achieved the goals of the procedure today. Hemostasis was excellent. The bladder was partially emptied per  cystoscope.  Procedure was then terminated. The patient tolerated the procedure well. No immediate perioperative complications. The patient taken to the Postanesthesia Care Unit in stable condition with plan for discharge to home after void.     PAA D: 05/29/2024 1:27:48 pm T: 05/30/2024 1:14:00 am  JOB: [id number]/ [id number]

## 2024-06-16 DIAGNOSIS — C670 Malignant neoplasm of trigone of bladder: Secondary | ICD-10-CM | POA: Diagnosis not present

## 2024-06-16 DIAGNOSIS — C662 Malignant neoplasm of left ureter: Secondary | ICD-10-CM | POA: Diagnosis not present

## 2024-06-17 DIAGNOSIS — K219 Gastro-esophageal reflux disease without esophagitis: Secondary | ICD-10-CM | POA: Diagnosis not present

## 2024-06-17 DIAGNOSIS — I10 Essential (primary) hypertension: Secondary | ICD-10-CM | POA: Diagnosis not present

## 2024-06-17 DIAGNOSIS — R945 Abnormal results of liver function studies: Secondary | ICD-10-CM | POA: Diagnosis not present

## 2024-06-17 DIAGNOSIS — E782 Mixed hyperlipidemia: Secondary | ICD-10-CM | POA: Diagnosis not present

## 2024-06-17 DIAGNOSIS — R7303 Prediabetes: Secondary | ICD-10-CM | POA: Diagnosis not present

## 2024-06-17 DIAGNOSIS — C689 Malignant neoplasm of urinary organ, unspecified: Secondary | ICD-10-CM | POA: Diagnosis not present

## 2024-06-25 ENCOUNTER — Ambulatory Visit (INDEPENDENT_AMBULATORY_CARE_PROVIDER_SITE_OTHER): Payer: PPO | Admitting: Otolaryngology

## 2024-06-25 ENCOUNTER — Encounter (INDEPENDENT_AMBULATORY_CARE_PROVIDER_SITE_OTHER): Payer: Self-pay | Admitting: Otolaryngology

## 2024-06-25 VITALS — BP 129/70 | HR 64

## 2024-06-25 DIAGNOSIS — H6123 Impacted cerumen, bilateral: Secondary | ICD-10-CM

## 2024-06-25 NOTE — Progress Notes (Signed)
 Patient ID: Jonathan Lane, male   DOB: 31-Dec-1940, 83 y.o.   MRN: [id number]  Procedure: Bilateral recurrent cerumen disimpaction.    Indication: Cerumen impaction, resulting in ear discomfort and conductive hearing loss.    Description: The patient is placed supine on the operating table. Under the operating microscope, the right ear canal is examined and is noted to be impacted with cerumen. The cerumen is carefully removed with a combination of suction catheters, cerumen curette, and alligator forceps. After the cerumen removal, the ear canal and tympanic membrane are noted to be normal. No middle ear effusion is noted. The same procedure is then repeated on the left side without exception. The patient tolerated the procedure well.   Follow-up care:  The patient is instructed not to use Q-tips to clean the ear canals. The patient will follow up in 6 months.

## 2024-08-06 DIAGNOSIS — H35371 Puckering of macula, right eye: Secondary | ICD-10-CM | POA: Diagnosis not present

## 2024-08-06 DIAGNOSIS — H353223 Exudative age-related macular degeneration, left eye, with inactive scar: Secondary | ICD-10-CM | POA: Diagnosis not present

## 2024-08-06 DIAGNOSIS — H43813 Vitreous degeneration, bilateral: Secondary | ICD-10-CM | POA: Diagnosis not present

## 2024-08-06 DIAGNOSIS — H353211 Exudative age-related macular degeneration, right eye, with active choroidal neovascularization: Secondary | ICD-10-CM | POA: Diagnosis not present

## 2024-08-07 DIAGNOSIS — D2272 Melanocytic nevi of left lower limb, including hip: Secondary | ICD-10-CM | POA: Diagnosis not present

## 2024-08-07 DIAGNOSIS — Z86018 Personal history of other benign neoplasm: Secondary | ICD-10-CM | POA: Diagnosis not present

## 2024-08-07 DIAGNOSIS — L570 Actinic keratosis: Secondary | ICD-10-CM | POA: Diagnosis not present

## 2024-08-07 DIAGNOSIS — D224 Melanocytic nevi of scalp and neck: Secondary | ICD-10-CM | POA: Diagnosis not present

## 2024-08-07 DIAGNOSIS — L820 Inflamed seborrheic keratosis: Secondary | ICD-10-CM | POA: Diagnosis not present

## 2024-08-07 DIAGNOSIS — D485 Neoplasm of uncertain behavior of skin: Secondary | ICD-10-CM | POA: Diagnosis not present

## 2024-08-07 DIAGNOSIS — B078 Other viral warts: Secondary | ICD-10-CM | POA: Diagnosis not present

## 2024-08-07 DIAGNOSIS — D225 Melanocytic nevi of trunk: Secondary | ICD-10-CM | POA: Diagnosis not present

## 2024-08-07 DIAGNOSIS — L821 Other seborrheic keratosis: Secondary | ICD-10-CM | POA: Diagnosis not present

## 2024-08-07 DIAGNOSIS — Z85828 Personal history of other malignant neoplasm of skin: Secondary | ICD-10-CM | POA: Diagnosis not present

## 2024-08-07 DIAGNOSIS — D2262 Melanocytic nevi of left upper limb, including shoulder: Secondary | ICD-10-CM | POA: Diagnosis not present

## 2024-08-07 DIAGNOSIS — L578 Other skin changes due to chronic exposure to nonionizing radiation: Secondary | ICD-10-CM | POA: Diagnosis not present

## 2024-08-25 DIAGNOSIS — C662 Malignant neoplasm of left ureter: Secondary | ICD-10-CM | POA: Diagnosis not present

## 2024-08-25 DIAGNOSIS — C670 Malignant neoplasm of trigone of bladder: Secondary | ICD-10-CM | POA: Diagnosis not present

## 2024-08-26 DIAGNOSIS — D485 Neoplasm of uncertain behavior of skin: Secondary | ICD-10-CM | POA: Diagnosis not present

## 2024-08-26 DIAGNOSIS — L98499 Non-pressure chronic ulcer of skin of other sites with unspecified severity: Secondary | ICD-10-CM | POA: Diagnosis not present

## 2024-09-02 DIAGNOSIS — H26492 Other secondary cataract, left eye: Secondary | ICD-10-CM | POA: Diagnosis not present

## 2024-09-02 DIAGNOSIS — H02834 Dermatochalasis of left upper eyelid: Secondary | ICD-10-CM | POA: Diagnosis not present

## 2024-09-02 DIAGNOSIS — Z961 Presence of intraocular lens: Secondary | ICD-10-CM | POA: Diagnosis not present

## 2024-09-02 DIAGNOSIS — H02831 Dermatochalasis of right upper eyelid: Secondary | ICD-10-CM | POA: Diagnosis not present

## 2024-09-02 DIAGNOSIS — H401133 Primary open-angle glaucoma, bilateral, severe stage: Secondary | ICD-10-CM | POA: Diagnosis not present

## 2024-09-08 ENCOUNTER — Other Ambulatory Visit: Payer: Self-pay

## 2024-09-08 DIAGNOSIS — I7143 Infrarenal abdominal aortic aneurysm, without rupture: Secondary | ICD-10-CM

## 2024-10-15 DIAGNOSIS — H353223 Exudative age-related macular degeneration, left eye, with inactive scar: Secondary | ICD-10-CM | POA: Diagnosis not present

## 2024-10-15 DIAGNOSIS — H43813 Vitreous degeneration, bilateral: Secondary | ICD-10-CM | POA: Diagnosis not present

## 2024-10-15 DIAGNOSIS — H353211 Exudative age-related macular degeneration, right eye, with active choroidal neovascularization: Secondary | ICD-10-CM | POA: Diagnosis not present

## 2024-10-15 DIAGNOSIS — H35371 Puckering of macula, right eye: Secondary | ICD-10-CM | POA: Diagnosis not present

## 2024-10-15 DIAGNOSIS — H401131 Primary open-angle glaucoma, bilateral, mild stage: Secondary | ICD-10-CM | POA: Diagnosis not present

## 2024-10-16 NOTE — Progress Notes (Unsigned)
 Office Note     CC:  follow up Requesting Provider:  Chrystal Lamarr RAMAN, *  HPI: Jonathan Lane is a 83 y.o. 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 He has no history of AAA. At the time of his last visit with Dr. Eliza in June of 2023 he was without any associated abdominal pain or back pain. No claudication, rest pain or non healing wounds.   Today he reports overall doing well. He says since his last visit here he had his gallbladder removed. He feels much better since then. No other changes in medical history. He says he has been feeling very good. He and his fiance stay very active. They walk 2 miles 5-6 days per week. He does not have any pain in his back or abdomen. No pain in his legs on ambulation or rest. No non healing wounds. He says they try to stay healthy. They are getting married on Valentines Day 2026.   The pt is on a statin for cholesterol management.    The pt is not taking aspirin .  Other AC:  Plavix  The pt is on ARB/HCTZ, CCB for hypertension.  The pt does not have diabetes. Tobacco hx:  Former  Past Medical History:  Diagnosis Date   Atrial fibrillation (HCC)    Blood clot in vein    2023 after surgery, dvt left leg, eliquis  x 6 months   Bradycardia    Cancer (HCC)    bladder   Cardiac murmur    GERD (gastroesophageal reflux disease)    Glaucoma    Heart palpitations    Hyperlipidemia    Hypertension    Iliac artery aneurysm    Right   Macular degeneration    Numbness 08/26/2019   LEFT FACE   Pneumonia    yrs ago   PVC (premature ventricular contraction)    Skin abnormalities    pre cancerous lesion R hand   Skin cancer    Sleep apnea    uses cpap   Snoring     Past Surgical History:  Procedure Laterality Date   APPENDECTOMY  12/19/1963   BIOPSY  10/10/2023   Procedure: BIOPSY;  Surgeon: Wilhelmenia Aloha Raddle., MD;  Location: [facility];  Service:  Gastroenterology;;   CHOLECYSTECTOMY N/A 10/11/2023   Procedure: LAPAROSCOPIC CHOLECYSTECTOMY;  Surgeon: Rubin Calamity, MD;  Location: [facility];  Service: General;  Laterality: N/A;   COLONOSCOPY     CYSTOSCOPY W/ RETROGRADES Bilateral 05/29/2024   Procedure: PHYLLIS, WITH RETROGRADE PYELOGRAM;  Surgeon: Alvaro Ricardo KATHEE Raddle., MD;  Location: [facility];  Service: Urology;  Laterality: Bilateral;   CYSTOSCOPY WITH BIOPSY N/A 05/16/2022   Procedure: CYSTOSCOPY WITH BIOPSY;  Surgeon: Watt Rush, MD;  Location: [facility];  Service: Urology;  Laterality: N/A;   CYSTOSCOPY WITH HOLMIUM LASER LITHOTRIPSY Left 11/02/2023   Procedure: HOLMIUM LASER ABLATION OF LEFT RENAL TUMOR;  Surgeon: Alvaro Ricardo KATHEE Raddle., MD;  Location[facility];  Service: Urology;  Laterality: Left;   CYSTOSCOPY WITH RETROGRADE PYELOGRAM, URETEROSCOPY AND STENT PLACEMENT Bilateral 05/16/2022   Procedure: CYSTOSCOPY WITH BILATERAL RETROGRADE PYELOGRAM, LEFT URETEROSCOPY WITH BIOPSY, HOLMIUM LASER OF LEFT URETERAL STONE  AND LEFT STENT EXCHANGE, TUR LEFT URETERAL ORIFACE;  Surgeon: Watt Rush, MD;  Location: [facility];  Service: Urology;  Laterality: Bilateral;  60 MINUTES   CYSTOSCOPY WITH RETROGRADE PYELOGRAM, URETEROSCOPY AND STENT PLACEMENT Left 07/26/2022   Procedure: CYSTOSCOPY  WITH RETROGRADE PYELOGRAM, URETEROSCOPY AND STENT EXCHANGE;  Surgeon: Alvaro Hummer, MD;  Location: [facility];  Service: Urology;  Laterality: Left;   CYSTOSCOPY WITH RETROGRADE PYELOGRAM, URETEROSCOPY AND STENT PLACEMENT Bilateral 11/02/2023   Procedure: CYSTOSCOPY WITH BILATERAL RETROGRADE PYELOGRAM, AND LEFT DIAGNOSTIC URETEROSCOPY;  Surgeon: Alvaro Hummer KATHEE Mickey., MD;  Location: [facility];  Service: Urology;  Laterality: Bilateral;   CYSTOSCOPY/URETEROSCOPY/HOLMIUM LASER/STENT PLACEMENT Left 05/29/2024   Procedure: CYSTOSCOPY/URETEROSCOPY/HOLMIUM LASER/STENT PLACEMENT;  Surgeon: Alvaro Hummer KATHEE Mickey., MD;  Location: [facility];   Service: Urology;  Laterality: Left;   ERCP N/A 10/10/2023   Procedure: ENDOSCOPIC RETROGRADE CHOLANGIOPANCREATOGRAPHY (ERCP);  Surgeon: Wilhelmenia Aloha Mickey., MD;  Location: [facility];  Service: Gastroenterology;  Laterality: N/A;   LYMPH NODE DISSECTION Bilateral 07/26/2022   Procedure: LYMPH NODE DISSECTION;  Surgeon: Alvaro Hummer, MD;  Location: [facility];  Service: Urology;  Laterality: Bilateral;   POLYPECTOMY     REMOVAL OF STONES  10/10/2023   Procedure: REMOVAL OF SLUDGE;  Surgeon: Wilhelmenia Aloha Mickey., MD;  Location: [facility];  Service: Gastroenterology;;   SKIN CANCER EXCISION  04/2023   mohs procedure right leg   SPHINCTEROTOMY  10/10/2023   Procedure: SPHINCTEROTOMY;  Surgeon: Mansouraty, Aloha Mickey., MD;  Location: [facility];  Service: Gastroenterology;;   TONSILLECTOMY     as a child x2   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   Procedure: RESTAGING TRANSURETHRAL RESECTION OF BLADDER TUMOR  (TURBT) LEFT URETEROSCOPY WITH LASER LEFT STENT EXCHANGE RIGHT STENT REMOVAL;  Surgeon: Watt Rush, MD;  Location: [facility];  Service: Urology;  Laterality: Bilateral;   TRANSURETHRAL RESECTION OF BLADDER TUMOR  05/29/2024   Procedure: TURBT (TRANSURETHRAL RESECTION OF BLADDER TUMOR);  Surgeon: Alvaro Hummer KATHEE Mickey., MD;  Location: [facility];  Service: Urology;;   TRANSURETHRAL RESECTION OF BLADDER TUMOR WITH MITOMYCIN -C Bilateral 01/03/2022   Procedure: CYSTOSCOPY TRANSURETHRAL RESECTION OF BLADDER TUMOR WITH POSSIBLE  BILATERAL RETROGRADES STENT PLACEMENT;  Surgeon: Watt Rush, MD;  Location: [facility];  Service: Urology;  Laterality: Bilateral;   WISDOM TOOTH EXTRACTION      Social History   Socioeconomic History   Marital status: Widowed    Spouse name: Avel   Number of children: 1   Years of education: Masters   Highest education level: Not on file  Occupational History    Employer: RETIRED  Tobacco Use   Smoking status: Former    Current  packs/day: 0.00    Types: Cigarettes    Quit date: 12/18/1965    Years since quitting: 58.8    Passive exposure: Never   Smokeless tobacco: Never  Vaping Use   Vaping status: Never Used  Substance and Sexual Activity   Alcohol use: Not Currently   Drug use: No   Sexual activity: Not on file  Other Topics Concern   Not on file  Social History Narrative   Patient is married ETTER Avel).   Patient is retired.   Patient has one adult child.   Patient does not drink any caffeine.   Patient is right-handed.   Patient has a Event organiser.            Social Drivers of Corporate Investment Banker Strain: Not on file  Food Insecurity: No Food Insecurity (10/06/2023)   Hunger Vital Sign    Worried About Running Out of Food in the Last Year: Never true    Ran Out of Food in the Last Year: Never true  Transportation Needs: No Transportation Needs (  10/06/2023)   PRAPARE - Administrator, Civil Service (Medical): No    Lack of Transportation (Non-Medical): No  Physical Activity: Not on file  Stress: Not on file  Social Connections: Not on file  Intimate Partner Violence: Not At Risk (10/06/2023)   Humiliation, Afraid, Rape, and Kick questionnaire    Fear of Current or Ex-Partner: No    Emotionally Abused: No    Physically Abused: No    Sexually Abused: No    Family History  Problem Relation Age of Onset   Stroke Father    Stroke Mother    Stroke Sister    Breast cancer Sister    High blood pressure Sister    Diabetes Sister    Colon cancer Other        Uncle   Colon cancer Paternal Uncle    Mitral valve prolapse Other        sugery 09-27-2017   Rectal cancer Neg Hx    Stomach cancer Neg Hx     Current Outpatient Medications  Medication Sig Dispense Refill   acetaminophen  (TYLENOL ) 500 MG tablet Take 500 mg by mouth every 8 (eight) hours as needed for moderate pain (pain score 4-6) or mild pain (pain score 1-3).     Apoaequorin (PREVAGEN EXTRA STRENGTH) 20 MG  CAPS Take 1 capsule by mouth daily.     atorvastatin  (LIPITOR ) 20 MG tablet Take 1 tablet (20 mg total) by mouth daily. (Patient taking differently: Take 20 mg by mouth at bedtime.) 30 tablet 0   Cholecalciferol 25 MCG (1000 UT) capsule Take 1,000 Units by mouth daily.     clopidogrel  (PLAVIX ) 75 MG tablet Take 75 mg by mouth daily.     Coenzyme Q-10 200 MG CAPS Take 200 mg by mouth daily.     COSOPT  PF 2-0.5 % SOLN ophthalmic solution Place 1 drop into both eyes in the morning and at bedtime.     CRANBERRY PO Take 650 mg by mouth daily.     cyanocobalamin (VITAMIN B12) 1000 MCG tablet Take 1,000 mcg by mouth daily.     diltiazem  (DILT-XR) 240 MG 24 hr capsule Take 1 capsule (240 mg total) by mouth daily. (Patient taking differently: Take 240 mg by mouth at bedtime.) 90 capsule 2   docusate sodium  (COLACE) 100 MG capsule Take 1 capsule (100 mg total) by mouth 2 (two) times daily. (Patient taking differently: Take 200 mg by mouth daily.)     esomeprazole  (NEXIUM ) 20 MG capsule Take 20 mg by mouth 2 (two) times daily before a meal.     fluticasone  (FLONASE ) 50 MCG/ACT nasal spray Place 2 sprays into both nostrils daily as needed for allergies or rhinitis.     losartan -hydrochlorothiazide  (HYZAAR) 100-25 MG tablet Take 1 tablet by mouth daily. (Patient taking differently: Take 1 tablet by mouth at bedtime.) 90 tablet 2   Melatonin 3 MG CAPS Take 6 capsules by mouth at bedtime.     Misc Natural Products (PROSTATE HEALTH) CAPS Take 1 capsule by mouth daily.     Multiple Vitamins-Minerals (ADULT GUMMY PO) Take 2 capsules by mouth daily.     Multiple Vitamins-Minerals (PRESERVISION AREDS 2+MULTI VIT) CAPS Take 1 capsule by mouth 2 times daily at 12 noon and 4 pm.     nitroGLYCERIN  (NITROSTAT ) 0.4 MG SL tablet Place 1 tablet (0.4 mg total) under the tongue every 5 (five) minutes as needed for chest pain. 10 tablet 1   NON FORMULARY Pt uses  a cpap nightly     Omega-3 Fatty Acids (FISH OIL) 1200 MG CAPS  Take 1,200 mg by mouth 2 (two) times daily.     OVER THE COUNTER MEDICATION Take 25 mg by mouth daily. Saffron 28 mg     Probiotic Product (ALIGN PO) Take 1 capsule by mouth in the morning.     RHOPRESSA  0.02 % SOLN Place 1 drop into both eyes at bedtime.      senna-docusate (SENOKOT-S) 8.6-50 MG tablet Take 1 tablet by mouth 2 (two) times daily. While taking prescription pain meds to prevent constipation 10 tablet 0   tadalafil  (CIALIS ) 5 MG tablet Take 5 mg by mouth daily.     tamsulosin  (FLOMAX ) 0.4 MG CAPS capsule Take 1 capsule (0.4 mg total) by mouth daily. 30 capsule 0   traMADol  (ULTRAM ) 50 MG tablet Take 1 tablet (50 mg total) by mouth every 6 (six) hours as needed for moderate pain (pain score 4-6) or severe pain (pain score 7-10) (post-operatively). 15 tablet 0   valACYclovir (VALTREX) 1000 MG tablet Take 1,000 mg by mouth 2 (two) times daily as needed (for outbreaks).     Zinc 25 MG TABS Take 25 mg by mouth daily.     No current facility-administered medications for this visit.    Allergies  Allergen Reactions   Brimonidine Tartrate-Timolol  Itching, Rash and Other (See Comments)    Itching eyes Other reaction(s): Not available   Brimonidine Tartrate Other (See Comments)    Irritates the eyes and increased pressure   Tetracycline Hcl Other (See Comments)    Doesn't remember reaction-like 55 years ago    Ciprofloxacin Other (See Comments)    Constipation     REVIEW OF SYSTEMS:  Negative unless noted in HPI [X]  denotes positive finding, [ ]  denotes negative finding Cardiac  Comments:  Chest pain or chest pressure:    Shortness of breath upon exertion:    Short of breath when lying flat:    Irregular heart rhythm:        Vascular    Pain in calf, thigh, or hip brought on by ambulation:    Pain in feet at night that wakes you up from your sleep:     Blood clot in your veins:    Leg swelling:         Pulmonary    Oxygen at home:    Productive cough:      Wheezing:         Neurologic    Sudden weakness in arms or legs:     Sudden numbness in arms or legs:     Sudden onset of difficulty speaking or slurred speech:    Temporary loss of vision in one eye:     Problems with dizziness:         Gastrointestinal    Blood in stool:     Vomited blood:         Genitourinary    Burning when urinating:     Blood in urine:        Psychiatric    Major depression:         Hematologic    Bleeding problems:    Problems with blood clotting too easily:        Skin    Rashes or ulcers:        Constitutional    Fever or chills:      PHYSICAL EXAMINATION:  Vitals:   10/17/24 1038  BP: 136/74  Pulse: (!) 47  Temp: 98.2 F (36.8 C)  TempSrc: Temporal  Weight: 200 lb 4.8 oz (90.9 kg)    General:  WDWN in NAD; vital signs documented above Gait: Normal HENT: WNL, normocephalic Pulmonary: normal non-labored breathing Cardiac: regular HR Abdomen: soft Vascular Exam/Pulses: 2+ femoral pulses, 2+ Dp and PT pulses bilaterally. Feet warm and well perfused Extremities: without ischemic changes, without Gangrene , without cellulitis; without open wounds;  Musculoskeletal: no muscle wasting or atrophy  Neurologic: A&O X 3 Psychiatric:  The pt has Normal affect.   Non-Invasive Vascular Imaging:   VAS US  AAA Duplex: Summary:  Abdominal Aorta: No evidence of an abdominal aortic aneurysm was visualized. Dilatation of the right mid and distal CIA (maximum diameter of 1.7 cm)   ASSESSMENT/PLAN:: 83 y.o. 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 He has no history of AAA. Duplex today shows stable right CIA aneurysm with maximum diameter of 1.7cm. He is without any associated symptoms.  - continue exercise regimen - continue Statin and Plavix  - reviewed signs and symptoms of rupture and he understands should he experience this to present immediately to ER or call 911 - He will follow up again in 2  years with repeat AAA duplex  Teretha Damme, PA-C [facility] [phone]  Clinic MD:   [place]

## 2024-10-17 ENCOUNTER — Ambulatory Visit (HOSPITAL_COMMUNITY)
Admission: RE | Admit: 2024-10-17 | Discharge: 2024-10-17 | Disposition: A | Discharge status: Home / Self Care | Source: Ambulatory Visit | Attending: Vascular Surgery | Admitting: Vascular Surgery

## 2024-10-17 ENCOUNTER — Ambulatory Visit (INDEPENDENT_AMBULATORY_CARE_PROVIDER_SITE_OTHER): Admitting: Physician Assistant

## 2024-10-17 ENCOUNTER — Encounter: Payer: Self-pay | Admitting: Physician Assistant

## 2024-10-17 VITALS — BP 136/74 | HR 47 | Temp 98.2°F | Wt 200.3 lb

## 2024-10-17 DIAGNOSIS — I723 Aneurysm of iliac artery: Secondary | ICD-10-CM

## 2024-10-17 DIAGNOSIS — I7143 Infrarenal abdominal aortic aneurysm, without rupture: Secondary | ICD-10-CM | POA: Insufficient documentation

## 2024-12-04 ENCOUNTER — Emergency Department (HOSPITAL_COMMUNITY)
Admission: EM | Admit: 2024-12-04 | Source: Home / Self Care | Attending: Emergency Medicine | Admitting: Emergency Medicine

## 2024-12-04 ENCOUNTER — Other Ambulatory Visit: Payer: Self-pay

## 2024-12-04 ENCOUNTER — Encounter (HOSPITAL_COMMUNITY): Payer: Self-pay

## 2024-12-04 DIAGNOSIS — Z8554 Personal history of malignant neoplasm of ureter: Secondary | ICD-10-CM | POA: Insufficient documentation

## 2024-12-04 DIAGNOSIS — R338 Other retention of urine: Secondary | ICD-10-CM

## 2024-12-04 DIAGNOSIS — R339 Retention of urine, unspecified: Secondary | ICD-10-CM | POA: Insufficient documentation

## 2024-12-04 DIAGNOSIS — Z8551 Personal history of malignant neoplasm of bladder: Secondary | ICD-10-CM | POA: Diagnosis not present

## 2024-12-04 DIAGNOSIS — R1030 Lower abdominal pain, unspecified: Secondary | ICD-10-CM | POA: Diagnosis not present

## 2024-12-04 LAB — CBC WITH DIFFERENTIAL/PLATELET
Abs Immature Granulocytes: 0 K/uL (ref 0.00–0.07)
Basophils Absolute: 0.1 K/uL (ref 0.0–0.1)
Basophils Relative: 1 %
Eosinophils Absolute: 0.2 K/uL (ref 0.0–0.5)
Eosinophils Relative: 2 %
HCT: 40.2 % (ref 39.0–52.0)
Hemoglobin: 13.6 g/dL (ref 13.0–17.0)
Immature Granulocytes: 0 %
Lymphocytes Relative: 23 %
Lymphs Abs: 1.7 K/uL (ref 0.7–4.0)
MCH: 32.1 pg (ref 26.0–34.0)
MCHC: 33.8 g/dL (ref 30.0–36.0)
MCV: 94.8 fL (ref 80.0–100.0)
Monocytes Absolute: 1 K/uL (ref 0.1–1.0)
Monocytes Relative: 14 %
Neutro Abs: 4.3 K/uL (ref 1.7–7.7)
Neutrophils Relative %: 60 %
Platelets: 185 K/uL (ref 150–400)
RBC: 4.24 MIL/uL (ref 4.22–5.81)
RDW: 13.4 % (ref 11.5–15.5)
WBC: 7.2 K/uL (ref 4.0–10.5)
nRBC: 0 % (ref 0.0–0.2)

## 2024-12-04 LAB — BASIC METABOLIC PANEL WITH GFR
Anion gap: 10 (ref 5–15)
BUN: 22 mg/dL (ref 8–23)
CO2: 26 mmol/L (ref 22–32)
Calcium: 9 mg/dL (ref 8.9–10.3)
Chloride: 100 mmol/L (ref 98–111)
Creatinine, Ser: 0.94 mg/dL (ref 0.61–1.24)
GFR, Estimated: >60 mL/min (ref >60)
Glucose, Bld: 107 mg/dL — ABNORMAL HIGH (ref 70–99)
Potassium: 4.1 mmol/L (ref 3.5–5.1)
Sodium: 136 mmol/L (ref 135–145)

## 2024-12-04 MED ORDER — LIDOCAINE HCL URETHRAL/MUCOSAL 2 % EX GEL
1.0000 | Freq: Once | CUTANEOUS | Status: AC
  Administered 2024-12-04: 22:00:00 1 via URETHRAL
  Filled 2024-12-04: qty 11

## 2024-12-04 MED ORDER — SODIUM CHLORIDE 0.9 % IR SOLN
3000.0000 mL | Status: DC
  Administered 2024-12-04: 22:00:00 3000 mL

## 2024-12-04 MED ORDER — LIDOCAINE HCL (CARDIAC) PF 100 MG/5ML IV SOSY
PREFILLED_SYRINGE | INTRAVENOUS | Status: AC
  Filled 2024-12-04: qty 5

## 2024-12-04 MED ORDER — HYDROCODONE-ACETAMINOPHEN 5-325 MG PO TABS
1.0000 | ORAL_TABLET | Freq: Once | ORAL | Status: AC
  Administered 2024-12-04: 21:00:00 1 via ORAL
  Filled 2024-12-04: qty 1

## 2024-12-04 NOTE — Discharge Instructions (Addendum)
 Your bladder have been irrigated and you are able to urinate on your own.  Please follow-up closely with your urologist for further care.  Return to ER if condition worsen.

## 2024-12-04 NOTE — ED Triage Notes (Signed)
 Says he had been scoped earlier today by [facility] for return of cancer. Hx BPH.   Currently being treated for bladder cancer.   Says he was told he may have difficulty urinating after procedure. Initially afterwards, had bloody output but has been unable to void for past 3 hours.

## 2024-12-04 NOTE — ED Notes (Signed)
 Three way Foley Catheter in. CBI started. PA informed.

## 2024-12-04 NOTE — ED Provider Notes (Incomplete)
°  Physical Exam  BP (!) 175/87   Pulse 74   Temp 98.1 F (36.7 C) (Oral)   Resp 16   Ht 6' 1 (1.854 m)   Wt 89.4 kg   SpO2 94%   BMI 25.99 kg/m   Physical Exam  Procedures  Procedures  ED Course / MDM    Medical Decision Making Amount and/or Complexity of Data Reviewed Labs: ordered.  Risk Prescription drug management.   Patient care assumed at shift handoff.  See previous providers note for full details.  In short, 83 year old male with recent hematuria and urinary retention.  Patient with history of bladder cancer who had cystoscopy earlier today.  Patient currently undergoing continuous bladder irrigation.  Plan to reevaluate after irrigation.  Likely discharge home with urologic follow-up  Patient passing pink-tinged urine/saline after irrigation.  Plan to discharge home with Foley and leg bag.  Patient will follow-up with urology.

## 2024-12-04 NOTE — ED Provider Notes (Signed)
 [place] EMERGENCY DEPARTMENT AT [facility] Provider Note   CSN: [id number] Arrival date & time: 12/04/24  8042     Patient presents with: Urinary Retention   Jonathan Lane Jonathan Lane is a 83 y.o. male.   The history is provided by the patient and medical records. No language interpreter was used.     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  Patient states that he has a history of bladder cancer for which he normally would have surveillance visit with his urologist every 6 months.  He was seen by his urologist today and had a cystoscopy procedure.  He was told that there was finding of several tumors within his bladder.  He is scheduled to have tumor removal in January of next year.  Once he got home, he noticed when urinating there was blood in his urine and for the past several hours he is unable to urinate and described a lot of pressure to his lower abdomen.  He is currently on Plavix .  He does not endorse any burning with urination denies having fever or chills.  No complaint of back pain.  Prior to Admission medications  Medication Sig Start Date End Date Taking? Authorizing Provider  acetaminophen  (TYLENOL ) 500 MG tablet Take 500 mg by mouth every 8 (eight) hours as needed for moderate pain (pain score 4-6) or mild pain (pain score 1-3).    [provider]  Apoaequorin (PREVAGEN EXTRA STRENGTH) 20 MG CAPS Take 1 capsule by mouth daily.    [provider]  atorvastatin  (LIPITOR ) 20 MG tablet Take 1 tablet (20 mg total) by mouth daily. Patient taking differently: Take 20 mg by mouth at bedtime. 08/26/19   Fairy Frames, MD  Cholecalciferol 25 MCG (1000 UT) capsule Take 1,000 Units by mouth daily.    [provider]  clopidogrel  (PLAVIX ) 75 MG tablet Take 75 mg by mouth daily.    [provider]  Coenzyme Q-10 200 MG CAPS Take 200 mg by mouth daily.     [provider]  COSOPT  PF 2-0.5 % SOLN ophthalmic solution Place 1 drop into both eyes in the morning and at bedtime.    [provider]  CRANBERRY PO Take 650 mg by mouth daily.    [provider]  cyanocobalamin (VITAMIN B12) 1000 MCG tablet Take 1,000 mcg by mouth daily.    [provider]  diltiazem  (DILT-XR) 240 MG 24 hr capsule Take 1 capsule (240 mg total) by mouth daily. Patient taking differently: Take 240 mg by mouth at bedtime. 03/27/24   Delford Maude BROCKS, MD  docusate sodium  (COLACE) 100 MG capsule Take 1 capsule (100 mg total) by mouth 2 (two) times daily. Patient taking differently: Take 200 mg by mouth daily. 07/26/22   Jonathan Palma, PA-Lane  esomeprazole  (NEXIUM ) 20 MG capsule Take 20 mg by mouth 2 (two) times daily before a meal.    [provider]  fluticasone  (FLONASE ) 50 MCG/ACT nasal spray Place 2 sprays into both nostrils daily as needed for allergies or rhinitis.    [provider]  losartan -hydrochlorothiazide  (HYZAAR) 100-25 MG tablet Take 1 tablet by mouth daily. Patient taking differently: Take 1 tablet by mouth at bedtime. 03/27/24   Nishan, Peter C, MD  Melatonin 3 MG CAPS Take 6 capsules by mouth at bedtime.    [provider]  Misc Natural Products (PROSTATE HEALTH) CAPS Take 1 capsule by mouth daily.  [provider]  Multiple Vitamins-Minerals (ADULT GUMMY PO) Take 2 capsules by mouth daily.    [provider]  Multiple Vitamins-Minerals (PRESERVISION AREDS 2+MULTI VIT) CAPS Take 1 capsule by mouth 2 times daily at 12 noon and 4 pm.    [provider]  nitroGLYCERIN  (NITROSTAT ) 0.4 MG SL tablet Place 1 tablet (0.4 mg total) under the tongue every 5 (five) minutes as needed for chest pain. 08/18/19   Regalado, Belkys A, MD  NON FORMULARY Pt uses a cpap nightly    [provider]  Omega-3 Fatty Acids (FISH OIL) 1200 MG CAPS Take 1,200 mg by mouth 2 (two) times daily.     [provider]  OVER THE COUNTER MEDICATION Take 25 mg by mouth daily. Saffron 28 mg    [provider]  Probiotic Product (ALIGN PO) Take 1 capsule by mouth in the morning.    [provider]  RHOPRESSA  0.02 % SOLN Place 1 drop into both eyes at bedtime.  01/08/18   [provider]  senna-docusate (SENOKOT-S) 8.6-50 MG tablet Take 1 tablet by mouth 2 (two) times daily. While taking prescription pain meds to prevent constipation 05/29/24   Jonathan Ricardo KATHEE Raddle., MD  tadalafil  (CIALIS ) 5 MG tablet Take 5 mg by mouth daily.    [provider]  tamsulosin  (FLOMAX ) 0.4 MG CAPS capsule Take 1 capsule (0.4 mg total) by mouth daily. 08/08/22   Jonathan Morene ORN, MD  traMADol  (ULTRAM ) 50 MG tablet Take 1 tablet (50 mg total) by mouth every 6 (six) hours as needed for moderate pain (pain score 4-6) or severe pain (pain score 7-10) (post-operatively). 05/29/24 05/29/25  Jonathan Ricardo KATHEE Raddle., MD  valACYclovir (VALTREX) 1000 MG tablet Take 1,000 mg by mouth 2 (two) times daily as needed (for outbreaks).    [provider]  Zinc 25 MG TABS Take 25 mg by mouth daily.    [provider]    Allergies: Brimonidine tartrate-timolol , Brimonidine tartrate, Tetracycline hcl, and Ciprofloxacin    Review of Systems  All other systems reviewed and are negative.   Updated Vital Signs BP (!) 155/80 (BP Location: Right Arm)   Pulse 79   Temp 98.1 F (36.7 Lane) (Oral)   Resp 18   Ht 6' 1 (1.854 m)   Wt 89.4 kg   SpO2 93%   BMI 25.99 kg/m   Physical Exam Constitutional:      General: He is not in acute distress.    Appearance: He is well-developed.  HENT:     Head: Atraumatic.  Eyes:     Conjunctiva/sclera: Conjunctivae normal.  Cardiovascular:     Rate and Rhythm: Normal rate and regular rhythm.     Pulses: Normal pulses.     Heart sounds: Normal heart sounds.  Abdominal:     Tenderness: There is abdominal tenderness (Mild suprapubic  tenderness but no abdominal distention or firmness noted.).  Genitourinary:    Comments: Chaperone present during exam.  No inguinal lymphadenopathy or inguinal hernia noted.  Normal circumcised penis free of lesion or rash.  Some dried blood noted at the urethral meatus.  Testicles nontender with normal lie Musculoskeletal:     Cervical back: Normal range of motion and neck supple.  Skin:    Findings: No rash.  Neurological:     Mental Status: He is alert and oriented to person, place, and time.     (all labs ordered are listed, but only abnormal results are displayed) Labs  Reviewed  BASIC METABOLIC PANEL WITH GFR - Abnormal; Notable for the following components:      Result Value   Glucose, Bld 107 (*)    All other components within normal limits  CBC WITH DIFFERENTIAL/PLATELET    EKG: None  Radiology: No results found.   Procedures   Medications Ordered in the ED  sodium chloride  irrigation 0.9 % 3,000 mL (3,000 mLs Irrigation New Bag/Given 12/04/24 2153)  HYDROcodone -acetaminophen  (NORCO/VICODIN) 5-325 MG per tablet 1 tablet (1 tablet Oral Given 12/04/24 2121)  lidocaine  (XYLOCAINE ) 2 % jelly 1 Application (1 Application Urethral Given 12/04/24 2153)                                    Medical Decision Making Amount and/or Complexity of Data Reviewed Labs: ordered.  Risk Prescription drug management.   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  Patient states that he has a history of bladder cancer for which he normally would have surveillance visit with his urologist every 6 months.  He was seen by his urologist today and had a cystoscopy procedure.  He was told that there was finding of several tumors within  his bladder.  He is scheduled to have tumor removal in January of next year.  Once he got home, he noticed when urinating there was blood in his urine and for the past several hours he is unable to urinate and described a lot of pressure to his lower abdomen.  He is currently on Plavix .  He does not endorse any burning with urination denies having fever or chills.  No complaint of back pain.  On exam patient is resting comfortably appears to be in no acute discomfort.  He does have some mild suprapubic tenderness on palpation.  Examination of the penis without any concerning finding.  Some dried blood noted at the urethral meatus but no tear or laceration.  Bedside bladder scan showing 284 cc of retained urine.  I suspect patient acute urinary retention is likely due to bleeding and clots causing temporary urethral obstruction.  Will insert Foley and perform bladder irrigation.  Patient is amenable with plan.  Care discussed with Dr. Cottie.   -Labs ordered, independently viewed and interpreted by me.  Labs remarkable for normal WBC, normal H&H -The patient was maintained on a cardiac monitor.  I personally viewed and interpreted the cardiac monitored which showed an underlying rhythm of: SR -This patient presents to the ED for concern of urinary retention, this involves an extensive number of treatment options, and is a complaint that carries with it a high risk of complications and morbidity.  The differential diagnosis includes hematuria, bladder outlet obstruction, cystitis, malignancy -Co morbidities that complicate the patient evaluation includes ureteral cancer -Treatment includes bladder irrigation -Reevaluation of the patient after these medicines showed that the patient improved -PCP office notes or outside notes reviewed -Discussion with oncoming team who will reassess pt once bladder is fully irrigated and pt can be discharge home to f/u with urology -Escalation to admission/observation  considered: dispo pending  Three-way catheter and  and bladder irrigation is actively going.  Once patient has clear urine and able to urinate on his own, will be stable for discharge with outpatient follow-up with urology     Final diagnoses:  Acute urinary retention    ED Discharge Orders     None          Nivia Colon, PA-Lane 12/04/24 2210    Cottie Donnice PARAS, MD 12/04/24 [phone]

## 2024-12-05 ENCOUNTER — Other Ambulatory Visit: Payer: Self-pay | Admitting: Urology

## 2024-12-05 ENCOUNTER — Telehealth: Payer: Self-pay | Admitting: Cardiovascular Disease

## 2024-12-05 NOTE — Telephone Encounter (Signed)
 Left message for the pt to call back and schedule an appt in office for preop clearance.

## 2024-12-05 NOTE — Telephone Encounter (Signed)
"  ° °  Pre-operative Risk Assessment    Patient Name: Jonathan Lane  DOB: 10-12-1941 MRN: [id number]   Date of last office visit: 11/26/23 Date of next office visit: n/a    Request for Surgical Clearance    Procedure:  Resection of bladder tumor w/ bilateral retrograde pyelogram; left ureteroscopy   Date of Surgery:  Clearance 01/08/25                                Surgeon:  Dr. Alvaro Socks Group or Practice Name:  [facility]  Phone number:  [phone]  Fax number:  [phone]    Type of Clearance Requested:   - Medical  - Pharmacy:  Hold Clopidogrel  (Plavix )     Type of Anesthesia:  General    Additional requests/questions:    Bonney Sheffield JONELLE Lenora   12/05/2024, 9:54 AM   "

## 2024-12-05 NOTE — Telephone Encounter (Signed)
" ° °  Name: Jonathan Lane  DOB: [birth year]  MRN: [id number]  Primary Cardiologist: Maude Emmer, MD  Chart reviewed as part of pre-operative protocol coverage. Because of [name]'s past medical history and time since last visit, he will require a follow-up in-office visit in order to better assess preoperative cardiovascular risk.  Pre-op covering staff: - Please schedule appointment and call patient to inform them. If patient already had an upcoming appointment within acceptable timeframe, please add pre-op clearance to the appointment notes so provider is aware. - Please contact requesting surgeon's office via preferred method (i.e, phone, fax) to inform them of need for appointment prior to surgery.  - Provider can give recommendations on holding antiplatelet during this office visit.  [name], NP  12/05/2024, 10:11 AM   "

## 2024-12-06 ENCOUNTER — Other Ambulatory Visit: Payer: Self-pay

## 2024-12-06 ENCOUNTER — Emergency Department (HOSPITAL_COMMUNITY)
Admission: EM | Admit: 2024-12-06 | Discharge: 2024-12-06 | Disposition: A | Discharge status: Home / Self Care | Attending: Emergency Medicine | Admitting: Emergency Medicine

## 2024-12-06 DIAGNOSIS — Z8673 Personal history of transient ischemic attack (TIA), and cerebral infarction without residual deficits: Secondary | ICD-10-CM | POA: Insufficient documentation

## 2024-12-06 DIAGNOSIS — T83091A Other mechanical complication of indwelling urethral catheter, initial encounter: Secondary | ICD-10-CM

## 2024-12-06 DIAGNOSIS — Z8554 Personal history of malignant neoplasm of ureter: Secondary | ICD-10-CM | POA: Insufficient documentation

## 2024-12-06 DIAGNOSIS — Z85828 Personal history of other malignant neoplasm of skin: Secondary | ICD-10-CM | POA: Insufficient documentation

## 2024-12-06 DIAGNOSIS — Z79899 Other long term (current) drug therapy: Secondary | ICD-10-CM | POA: Insufficient documentation

## 2024-12-06 DIAGNOSIS — Y732 Prosthetic and other implants, materials and accessory gastroenterology and urology devices associated with adverse incidents: Secondary | ICD-10-CM | POA: Insufficient documentation

## 2024-12-06 DIAGNOSIS — Z7902 Long term (current) use of antithrombotics/antiplatelets: Secondary | ICD-10-CM | POA: Insufficient documentation

## 2024-12-06 DIAGNOSIS — I10 Essential (primary) hypertension: Secondary | ICD-10-CM | POA: Insufficient documentation

## 2024-12-06 DIAGNOSIS — T83098A Other mechanical complication of other indwelling urethral catheter, initial encounter: Secondary | ICD-10-CM | POA: Insufficient documentation

## 2024-12-06 NOTE — ED Triage Notes (Signed)
 Pt has c/o no urine output from catheter today

## 2024-12-06 NOTE — ED Provider Notes (Signed)
 " [place] EMERGENCY DEPARTMENT AT [facility] Provider Note  CSN: [id number] Arrival date & time: 12/06/24 1559  Chief Complaint(s) No chief complaint on file.  HPI Jonathan Lane is a 83 y.o. 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  Noticed this morning that the catheter seem to have stopped draining.  Reports some lower abdominal fullness and discomfort.  No fevers or chills.  Has had some persistent red urine draining from catheter.  No back or flank pain.   Past Medical History Past Medical History:  Diagnosis Date   Atrial fibrillation (HCC)    Blood clot in vein    2023 after surgery, dvt left leg, eliquis  x 6 months   Bradycardia    Cancer (HCC)    bladder   Cardiac murmur    GERD (gastroesophageal reflux disease)    Glaucoma    Heart palpitations    Hyperlipidemia    Hypertension    Iliac artery aneurysm    Right   Macular degeneration    Numbness 08/26/2019   LEFT FACE   Pneumonia    yrs ago   PVC (premature ventricular contraction)    Skin abnormalities    pre cancerous lesion R hand   Skin cancer    Sleep apnea    uses cpap   Snoring    Patient Active Problem List   Diagnosis Date Noted   Impacted cerumen of both ears 12/27/2023   Gastritis and gastroduodenitis 10/10/2023   Abnormal LFTs 10/10/2023   Biliary sludge 10/10/2023   Ascending cholangitis (HCC) 10/10/2023   Acute biliary pancreatitis 10/06/2023   Cholangitis (HCC) 10/05/2023   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HCC) 01/31/2022   Bladder cancer (HCC) 01/03/2022   TIA (transient ischemic attack) 08/25/2019   Hypokalemia 08/18/2019   Obstructive sleep apnea 01/22/2019   Gastroesophageal reflux disease 05/22/2018   Sore throat 05/22/2018   RLQ abdominal pain 03/15/2018   Chest pain 01/29/2018   OSA on CPAP 10/11/2015   Dependence on CPAP ventilation 10/09/2014   Hypersomnia, persistent  04/09/2014   Snoring    CARDIAC MURMUR 08/31/2009   Cerebrovascular disease 06/02/2009   Essential hypertension 06/02/2009   DIZZINESS 06/02/2009   PALPITATIONS 06/02/2009   Home Medication(s) Prior to Admission medications  Medication Sig Start Date End Date Taking? Authorizing Provider  acetaminophen  (TYLENOL ) 500 MG tablet Take 500 mg by mouth every 8 (eight) hours as needed for moderate pain (pain score 4-6) or mild pain (pain score 1-3).    [provider]  Apoaequorin (PREVAGEN EXTRA STRENGTH) 20 MG CAPS Take 1 capsule by mouth daily.    [provider]  atorvastatin  (LIPITOR ) 20 MG tablet Take 1 tablet (20 mg total) by mouth daily. Patient taking differently: Take 20 mg by mouth at bedtime. 08/26/19   Fairy Frames, MD  Cholecalciferol 25 MCG (1000 UT) capsule Take 1,000 Units by mouth daily.    [provider]  clopidogrel  (PLAVIX ) 75 MG tablet Take 75 mg by mouth daily.    [provider]  Coenzyme Q-10 200 MG CAPS Take 200 mg by mouth daily.    [provider]  COSOPT  PF 2-0.5 % SOLN ophthalmic solution Place 1 drop into both eyes in the morning and at bedtime.    [provider]  CRANBERRY PO Take 650 mg by mouth daily.    [provider]  cyanocobalamin (VITAMIN B12) 1000 MCG tablet  Take 1,000 mcg by mouth daily.    [provider]  diltiazem  (DILT-XR) 240 MG 24 hr capsule Take 1 capsule (240 mg total) by mouth daily. Patient taking differently: Take 240 mg by mouth at bedtime. 03/27/24   Delford Maude BROCKS, MD  docusate sodium  (COLACE) 100 MG capsule Take 1 capsule (100 mg total) by mouth 2 (two) times daily. Patient taking differently: Take 200 mg by mouth daily. 07/26/22   Cory Palma, PA-C  esomeprazole  (NEXIUM ) 20 MG capsule Take 20 mg by mouth 2 (two) times daily before a meal.    [provider]  fluticasone  (FLONASE ) 50 MCG/ACT nasal spray Place 2 sprays into both nostrils daily as needed for  allergies or rhinitis.    [provider]  losartan -hydrochlorothiazide  (HYZAAR) 100-25 MG tablet Take 1 tablet by mouth daily. Patient taking differently: Take 1 tablet by mouth at bedtime. 03/27/24   Nishan, Peter C, MD  Melatonin 3 MG CAPS Take 6 capsules by mouth at bedtime.    [provider]  Misc Natural Products (PROSTATE HEALTH) CAPS Take 1 capsule by mouth daily.    [provider]  Multiple Vitamins-Minerals (ADULT GUMMY PO) Take 2 capsules by mouth daily.    [provider]  Multiple Vitamins-Minerals (PRESERVISION AREDS 2+MULTI VIT) CAPS Take 1 capsule by mouth 2 times daily at 12 noon and 4 pm.    [provider]  nitroGLYCERIN  (NITROSTAT ) 0.4 MG SL tablet Place 1 tablet (0.4 mg total) under the tongue every 5 (five) minutes as needed for chest pain. 08/18/19   Regalado, Belkys A, MD  NON FORMULARY Pt uses a cpap nightly    [provider]  Omega-3 Fatty Acids (FISH OIL) 1200 MG CAPS Take 1,200 mg by mouth 2 (two) times daily.    [provider]  OVER THE COUNTER MEDICATION Take 25 mg by mouth daily. Saffron 28 mg    [provider]  Probiotic Product (ALIGN PO) Take 1 capsule by mouth in the morning.    [provider]  RHOPRESSA  0.02 % SOLN Place 1 drop into both eyes at bedtime.  01/08/18   [provider]  senna-docusate (SENOKOT-S) 8.6-50 MG tablet Take 1 tablet by mouth 2 (two) times daily. While taking prescription pain meds to prevent constipation 05/29/24   Alvaro Ricardo KATHEE Raddle., MD  tadalafil  (CIALIS ) 5 MG tablet Take 5 mg by mouth daily.    [provider]  tamsulosin  (FLOMAX ) 0.4 MG CAPS capsule Take 1 capsule (0.4 mg total) by mouth daily. 08/08/22   Cam Morene ORN, MD  traMADol  (ULTRAM ) 50 MG tablet Take 1 tablet (50 mg total) by mouth every 6 (six) hours as needed for moderate pain (pain score 4-6) or severe pain (pain score 7-10) (post-operatively). 05/29/24 05/29/25  Alvaro Ricardo KATHEE Raddle., MD  valACYclovir (VALTREX) 1000 MG tablet Take 1,000 mg by mouth 2 (two) times daily as needed (for outbreaks).    [provider]  Zinc 25 MG TABS Take 25 mg by mouth daily.    [provider]  Past Surgical History Past Surgical History:  Procedure Laterality Date   APPENDECTOMY  12/19/1963   BIOPSY  10/10/2023   Procedure: BIOPSY;  Surgeon: Wilhelmenia Aloha Raddle., MD;  Location: [facility];  Service: Gastroenterology;;   CHOLECYSTECTOMY N/A 10/11/2023   Procedure: LAPAROSCOPIC CHOLECYSTECTOMY;  Surgeon: Rubin Calamity, MD;  Location: [facility];  Service: General;  Laterality: N/A;   COLONOSCOPY     CYSTOSCOPY W/ RETROGRADES Bilateral 05/29/2024   Procedure: CYSTOSCOPY, WITH RETROGRADE PYELOGRAM;  Surgeon: Alvaro Ricardo KATHEE Raddle., MD;  Location: [facility];  Service: Urology;  Laterality: Bilateral;   CYSTOSCOPY WITH BIOPSY N/A 05/16/2022   Procedure: CYSTOSCOPY WITH BIOPSY;  Surgeon: Watt Rush, MD;  Location: [facility];  Service: Urology;  Laterality: N/A;   CYSTOSCOPY WITH HOLMIUM LASER LITHOTRIPSY Left 11/02/2023   Procedure: HOLMIUM LASER ABLATION OF LEFT RENAL TUMOR;  Surgeon: Alvaro Ricardo KATHEE Raddle., MD;  Location: [facility];  Service: Urology;  Laterality: Left;   CYSTOSCOPY WITH RETROGRADE PYELOGRAM, URETEROSCOPY AND STENT PLACEMENT Bilateral 05/16/2022   Procedure: CYSTOSCOPY WITH BILATERAL RETROGRADE PYELOGRAM, LEFT URETEROSCOPY WITH BIOPSY, HOLMIUM LASER OF LEFT URETERAL STONE  AND LEFT STENT EXCHANGE, TUR LEFT URETERAL ORIFACE;  Surgeon: Watt Rush, MD;  Location: [facility];  Service: Urology;  Laterality: Bilateral;  60 MINUTES   CYSTOSCOPY WITH RETROGRADE PYELOGRAM, URETEROSCOPY AND STENT PLACEMENT Left 07/26/2022   Procedure: CYSTOSCOPY WITH RETROGRADE PYELOGRAM, URETEROSCOPY AND STENT EXCHANGE;  Surgeon:  Alvaro Ricardo, MD;  Location: [facility];  Service: Urology;  Laterality: Left;   CYSTOSCOPY WITH RETROGRADE PYELOGRAM, URETEROSCOPY AND STENT PLACEMENT Bilateral 11/02/2023   Procedure: CYSTOSCOPY WITH BILATERAL RETROGRADE PYELOGRAM, AND LEFT DIAGNOSTIC URETEROSCOPY;  Surgeon: Alvaro Ricardo KATHEE Raddle., MD;  Location: [facility];  Service: Urology;  Laterality: Bilateral;   CYSTOSCOPY/URETEROSCOPY/HOLMIUM LASER/STENT PLACEMENT Left 05/29/2024   Procedure: CYSTOSCOPY/URETEROSCOPY/HOLMIUM LASER/STENT PLACEMENT;  Surgeon: Alvaro Ricardo KATHEE Raddle., MD;  Location: [facility];  Service: Urology;  Laterality: Left;   ERCP N/A 10/10/2023   Procedure: ENDOSCOPIC RETROGRADE CHOLANGIOPANCREATOGRAPHY (ERCP);  Surgeon: Wilhelmenia Aloha Raddle., MD;  Location: [facility];  Service: Gastroenterology;  Laterality: N/A;   LYMPH NODE DISSECTION Bilateral 07/26/2022   Procedure: LYMPH NODE DISSECTION;  Surgeon: Alvaro Ricardo, MD;  Location: [facility];  Service: Urology;  Laterality: Bilateral;   POLYPECTOMY     REMOVAL OF STONES  10/10/2023   Procedure: REMOVAL OF SLUDGE;  Surgeon: Wilhelmenia Aloha Raddle., MD;  Location: [facility];  Service: Gastroenterology;;   SKIN CANCER EXCISION  04/2023   mohs procedure right leg   SPHINCTEROTOMY  10/10/2023   Procedure: SPHINCTEROTOMY;  Surgeon: Mansouraty, Aloha Raddle., MD;  Location: [facility];  Service: Gastroenterology;;   TONSILLECTOMY     as a child x2   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   Procedure: RESTAGING TRANSURETHRAL RESECTION OF BLADDER TUMOR  (TURBT) LEFT URETEROSCOPY WITH LASER LEFT STENT EXCHANGE RIGHT STENT REMOVAL;  Surgeon: Watt Rush, MD;  Location: [facility];  Service: Urology;  Laterality: Bilateral;   TRANSURETHRAL RESECTION OF BLADDER TUMOR  05/29/2024   Procedure: TURBT (TRANSURETHRAL RESECTION OF BLADDER TUMOR);  Surgeon: Alvaro Ricardo KATHEE Raddle., MD;  Location: [facility];  Service: Urology;;   TRANSURETHRAL RESECTION OF  BLADDER TUMOR WITH MITOMYCIN -C Bilateral 01/03/2022   Procedure: CYSTOSCOPY TRANSURETHRAL RESECTION OF BLADDER TUMOR WITH POSSIBLE  BILATERAL RETROGRADES STENT PLACEMENT;  Surgeon: Watt Rush, MD;  Location: [facility];  Service: Urology;  Laterality: Bilateral;   WISDOM TOOTH EXTRACTION     Family History Family History  Problem Relation Age of Onset   Stroke Father    Stroke Mother    Stroke Sister    Breast cancer Sister    High blood pressure Sister    Diabetes Sister    Colon cancer Other        Uncle   Colon cancer Paternal Uncle    Mitral valve prolapse Other        sugery 09-27-2017   Rectal cancer Neg Hx    Stomach cancer Neg Hx     Social History Social History[1] Allergies Brimonidine tartrate-timolol , Brimonidine tartrate, Tetracycline hcl, and Ciprofloxacin  Review of Systems Review of Systems  All other systems reviewed and are negative.   Physical Exam Vital Signs  I have reviewed the triage vital signs BP (!) 166/77   Pulse 76   Temp 97.8 F (36.6 C) (Oral)   Resp 14   Ht 6' 1 (1.854 m)   Wt 90.7 kg   SpO2 98%   BMI 26.39 kg/m  Physical Exam Vitals and nursing note reviewed.  Constitutional:      General: He is not in acute distress.    Appearance: Normal appearance.  HENT:     Mouth/Throat:     Mouth: Mucous membranes are moist.  Eyes:     Conjunctiva/sclera: Conjunctivae normal.  Cardiovascular:     Rate and Rhythm: Normal rate and regular rhythm.  Pulmonary:     Effort: Pulmonary effort is normal. No respiratory distress.     Breath sounds: Normal breath sounds.  Abdominal:     General: Abdomen is flat.     Palpations: Abdomen is soft.     Tenderness: There is no abdominal tenderness.  Genitourinary:    Comments: Foley catheter in place, draining red urine Musculoskeletal:     Right lower leg: No edema.     Left lower leg: No edema.  Skin:    General: Skin is warm and dry.     Capillary Refill: Capillary refill takes less than 2  seconds.  Neurological:     Mental Status: He is alert and oriented to person, place, and time. Mental status is at baseline.  Psychiatric:        Mood and Affect: Mood normal.        Behavior: Behavior normal.     ED Results and Treatments Labs (all labs ordered are listed, but only abnormal results are displayed) Labs Reviewed - No data to display                                                                                                                        Radiology No results found.  Pertinent labs & imaging results that were available during my care of the patient were reviewed by me and considered in my medical decision making (see MDM for details).  Medications Ordered in ED Medications - No data to display  Procedures Procedures  (including critical care time)  Medical Decision Making / ED Course   MDM:  83 year old presenting to the emergency department Foley catheter issue.  Patient is overall well-appearing, afebrile.  His catheter was irrigated by nursing with resolution of obstruction.  He is feeling much better.  Was bladder scan by nursing with 0 residual mL.  Given the symptoms have resolved, do not think that laboratory testing is necessary at the moment.  He is feeling at baseline.  He has not had any infectious symptoms.  We will provide him with some irrigation supplies so he can attempt irrigation at home if this recurs.  He has follow-up with his urologist next week. Will discharge patient to home. All questions answered. Patient comfortable with plan of discharge. Return precautions discussed with patient and specified on the after visit summary.       Additional history obtained: -Additional history obtained from friend -External records from outside source obtained and reviewed including: Chart review including  previous notes, labs, imaging, consultation notes including prior notes     Reevaluation: After the interventions noted above, I reevaluated the patient and found that their symptoms have resolved  Co morbidities that complicate the patient evaluation  Past Medical History:  Diagnosis Date   Atrial fibrillation (HCC)    Blood clot in vein    2023 after surgery, dvt left leg, eliquis  x 6 months   Bradycardia    Cancer (HCC)    bladder   Cardiac murmur    GERD (gastroesophageal reflux disease)    Glaucoma    Heart palpitations    Hyperlipidemia    Hypertension    Iliac artery aneurysm    Right   Macular degeneration    Numbness 08/26/2019   LEFT FACE   Pneumonia    yrs ago   PVC (premature ventricular contraction)    Skin abnormalities    pre cancerous lesion R hand   Skin cancer    Sleep apnea    uses cpap   Snoring       Dispostion: Disposition decision including need for hospitalization was considered, and patient discharged from emergency department.    Final Clinical Impression(s) / ED Diagnoses Final diagnoses:  Complication, blocked Foley catheter, initial encounter     This chart was dictated using voice recognition software.  Despite best efforts to proofread,  errors can occur which can change the documentation meaning.     [1]  Social History Tobacco Use   Smoking status: Former    Current packs/day: 0.00    Types: Cigarettes    Quit date: 12/18/1965    Years since quitting: 59.0    Passive exposure: Never   Smokeless tobacco: Never  Vaping Use   Vaping status: Never Used  Substance Use Topics   Alcohol use: Not Currently   Drug use: No     Francesca Elsie CROME, MD 12/06/24 2012  "

## 2024-12-06 NOTE — Discharge Instructions (Signed)
 We evaluated you for a blocked Foley catheter.  This was irrigated with improvement of symptoms.  If necessary, you can irrigate your bladder at home with sterile water  and syringes we have given you.  If this does not help relieve the blockage then you should immediately return to the emergency department.  Please follow-up closely with Dr. Alvaro for further care.

## 2024-12-07 ENCOUNTER — Inpatient Hospital Stay (HOSPITAL_COMMUNITY)
Admission: EM | Admit: 2024-12-07 | Discharge: 2024-12-12 | DRG: 669 | Disposition: A | Discharge status: Home / Self Care | Attending: Internal Medicine | Admitting: Internal Medicine

## 2024-12-07 ENCOUNTER — Other Ambulatory Visit: Payer: Self-pay

## 2024-12-07 ENCOUNTER — Encounter (HOSPITAL_COMMUNITY): Payer: Self-pay | Admitting: Emergency Medicine

## 2024-12-07 ENCOUNTER — Emergency Department (HOSPITAL_COMMUNITY)
Admission: EM | Admit: 2024-12-07 | Discharge: 2024-12-07 | Disposition: A | Discharge status: Home / Self Care | Source: Home / Self Care | Attending: Emergency Medicine | Admitting: Emergency Medicine

## 2024-12-07 DIAGNOSIS — Z86718 Personal history of other venous thrombosis and embolism: Secondary | ICD-10-CM

## 2024-12-07 DIAGNOSIS — H409 Unspecified glaucoma: Secondary | ICD-10-CM | POA: Diagnosis present

## 2024-12-07 DIAGNOSIS — Z803 Family history of malignant neoplasm of breast: Secondary | ICD-10-CM

## 2024-12-07 DIAGNOSIS — G4733 Obstructive sleep apnea (adult) (pediatric): Secondary | ICD-10-CM | POA: Diagnosis present

## 2024-12-07 DIAGNOSIS — Z01810 Encounter for preprocedural cardiovascular examination: Secondary | ICD-10-CM

## 2024-12-07 DIAGNOSIS — C679 Malignant neoplasm of bladder, unspecified: Secondary | ICD-10-CM | POA: Diagnosis present

## 2024-12-07 DIAGNOSIS — T83098A Other mechanical complication of other indwelling urethral catheter, initial encounter: Secondary | ICD-10-CM | POA: Insufficient documentation

## 2024-12-07 DIAGNOSIS — R319 Hematuria, unspecified: Secondary | ICD-10-CM | POA: Insufficient documentation

## 2024-12-07 DIAGNOSIS — E785 Hyperlipidemia, unspecified: Secondary | ICD-10-CM | POA: Diagnosis present

## 2024-12-07 DIAGNOSIS — I723 Aneurysm of iliac artery: Secondary | ICD-10-CM | POA: Diagnosis present

## 2024-12-07 DIAGNOSIS — C675 Malignant neoplasm of bladder neck: Principal | ICD-10-CM | POA: Diagnosis present

## 2024-12-07 DIAGNOSIS — R310 Gross hematuria: Principal | ICD-10-CM | POA: Diagnosis present

## 2024-12-07 DIAGNOSIS — I10 Essential (primary) hypertension: Secondary | ICD-10-CM | POA: Insufficient documentation

## 2024-12-07 DIAGNOSIS — Y732 Prosthetic and other implants, materials and accessory gastroenterology and urology devices associated with adverse incidents: Secondary | ICD-10-CM | POA: Insufficient documentation

## 2024-12-07 DIAGNOSIS — Z7902 Long term (current) use of antithrombotics/antiplatelets: Secondary | ICD-10-CM

## 2024-12-07 DIAGNOSIS — Z85828 Personal history of other malignant neoplasm of skin: Secondary | ICD-10-CM

## 2024-12-07 DIAGNOSIS — Z8551 Personal history of malignant neoplasm of bladder: Secondary | ICD-10-CM | POA: Insufficient documentation

## 2024-12-07 DIAGNOSIS — E876 Hypokalemia: Secondary | ICD-10-CM | POA: Diagnosis present

## 2024-12-07 DIAGNOSIS — C662 Malignant neoplasm of left ureter: Secondary | ICD-10-CM | POA: Diagnosis present

## 2024-12-07 DIAGNOSIS — Z881 Allergy status to other antibiotic agents status: Secondary | ICD-10-CM

## 2024-12-07 DIAGNOSIS — K219 Gastro-esophageal reflux disease without esophagitis: Secondary | ICD-10-CM | POA: Diagnosis present

## 2024-12-07 DIAGNOSIS — T83091A Other mechanical complication of indwelling urethral catheter, initial encounter: Secondary | ICD-10-CM | POA: Diagnosis present

## 2024-12-07 DIAGNOSIS — Z833 Family history of diabetes mellitus: Secondary | ICD-10-CM

## 2024-12-07 DIAGNOSIS — Y738 Miscellaneous gastroenterology and urology devices associated with adverse incidents, not elsewhere classified: Secondary | ICD-10-CM | POA: Diagnosis present

## 2024-12-07 DIAGNOSIS — R339 Retention of urine, unspecified: Secondary | ICD-10-CM | POA: Diagnosis present

## 2024-12-07 DIAGNOSIS — Z87442 Personal history of urinary calculi: Secondary | ICD-10-CM

## 2024-12-07 DIAGNOSIS — H3530 Unspecified macular degeneration: Secondary | ICD-10-CM | POA: Diagnosis present

## 2024-12-07 DIAGNOSIS — Z888 Allergy status to other drugs, medicaments and biological substances status: Secondary | ICD-10-CM

## 2024-12-07 DIAGNOSIS — Z906 Acquired absence of other parts of urinary tract: Secondary | ICD-10-CM

## 2024-12-07 DIAGNOSIS — Z79899 Other long term (current) drug therapy: Secondary | ICD-10-CM

## 2024-12-07 DIAGNOSIS — Z800 Family history of malignant neoplasm of digestive organs: Secondary | ICD-10-CM

## 2024-12-07 DIAGNOSIS — Z8673 Personal history of transient ischemic attack (TIA), and cerebral infarction without residual deficits: Secondary | ICD-10-CM

## 2024-12-07 DIAGNOSIS — Z5181 Encounter for therapeutic drug level monitoring: Secondary | ICD-10-CM

## 2024-12-07 DIAGNOSIS — N3289 Other specified disorders of bladder: Secondary | ICD-10-CM | POA: Diagnosis present

## 2024-12-07 DIAGNOSIS — G459 Transient cerebral ischemic attack, unspecified: Secondary | ICD-10-CM | POA: Diagnosis present

## 2024-12-07 DIAGNOSIS — Z823 Family history of stroke: Secondary | ICD-10-CM

## 2024-12-07 DIAGNOSIS — Z87891 Personal history of nicotine dependence: Secondary | ICD-10-CM

## 2024-12-07 DIAGNOSIS — C652 Malignant neoplasm of left renal pelvis: Secondary | ICD-10-CM | POA: Diagnosis present

## 2024-12-07 DIAGNOSIS — Z9049 Acquired absence of other specified parts of digestive tract: Secondary | ICD-10-CM

## 2024-12-07 LAB — CBC
HCT: 38.8 % — ABNORMAL LOW (ref 39.0–52.0)
Hemoglobin: 13 g/dL (ref 13.0–17.0)
MCH: 31.9 pg (ref 26.0–34.0)
MCHC: 33.5 g/dL (ref 30.0–36.0)
MCV: 95.1 fL (ref 80.0–100.0)
Platelets: 184 K/uL (ref 150–400)
RBC: 4.08 MIL/uL — ABNORMAL LOW (ref 4.22–5.81)
RDW: 13.5 % (ref 11.5–15.5)
WBC: 8.2 K/uL (ref 4.0–10.5)
nRBC: 0 % (ref 0.0–0.2)

## 2024-12-07 LAB — BASIC METABOLIC PANEL WITH GFR
Anion gap: 10 (ref 5–15)
BUN: 22 mg/dL (ref 8–23)
CO2: 26 mmol/L (ref 22–32)
Calcium: 9.2 mg/dL (ref 8.9–10.3)
Chloride: 103 mmol/L (ref 98–111)
Creatinine, Ser: 0.82 mg/dL (ref 0.61–1.24)
GFR, Estimated: >60 mL/min
Glucose, Bld: 117 mg/dL — ABNORMAL HIGH (ref 70–99)
Potassium: 3.4 mmol/L — ABNORMAL LOW (ref 3.5–5.1)
Sodium: 139 mmol/L (ref 135–145)

## 2024-12-07 LAB — MAGNESIUM: Magnesium: 1.9 mg/dL (ref 1.7–2.4)

## 2024-12-07 MED ORDER — ONDANSETRON HCL 4 MG PO TABS: 4.0000 mg | ORAL_TABLET | Freq: Four times a day (QID) | ORAL | Status: DC | PRN

## 2024-12-07 MED ORDER — MELATONIN 5 MG PO TABS
5.0000 mg | ORAL_TABLET | Freq: Every day | ORAL | Status: DC
  Administered 2024-12-07 – 2024-12-11 (×5): 5 mg via ORAL
  Filled 2024-12-07 (×5): qty 1

## 2024-12-07 MED ORDER — POTASSIUM CHLORIDE CRYS ER 20 MEQ PO TBCR
20.0000 meq | EXTENDED_RELEASE_TABLET | Freq: Once | ORAL | Status: AC
  Administered 2024-12-07: 20 meq via ORAL
  Filled 2024-12-07: qty 1

## 2024-12-07 MED ORDER — METOPROLOL TARTRATE 5 MG/5ML IV SOLN: 5.0000 mg | Freq: Four times a day (QID) | INTRAVENOUS | Status: DC | PRN

## 2024-12-07 MED ORDER — HYDROCHLOROTHIAZIDE 25 MG PO TABS
25.0000 mg | ORAL_TABLET | Freq: Every day | ORAL | Status: DC
  Administered 2024-12-07 – 2024-12-11 (×5): 25 mg via ORAL
  Filled 2024-12-07 (×5): qty 1

## 2024-12-07 MED ORDER — LOSARTAN POTASSIUM 50 MG PO TABS
100.0000 mg | ORAL_TABLET | Freq: Every day | ORAL | Status: DC
  Administered 2024-12-07 – 2024-12-11 (×5): 100 mg via ORAL
  Filled 2024-12-07 (×5): qty 2

## 2024-12-07 MED ORDER — ACETAMINOPHEN 325 MG PO TABS
650.0000 mg | ORAL_TABLET | Freq: Four times a day (QID) | ORAL | Status: DC | PRN
  Administered 2024-12-11 – 2024-12-12 (×3): 650 mg via ORAL
  Filled 2024-12-07 (×4): qty 2

## 2024-12-07 MED ORDER — ACETAMINOPHEN 650 MG RE SUPP: 650.0000 mg | Freq: Four times a day (QID) | RECTAL | Status: DC | PRN

## 2024-12-07 MED ORDER — DILTIAZEM HCL ER COATED BEADS 240 MG PO CP24
240.0000 mg | ORAL_CAPSULE | Freq: Every day | ORAL | Status: DC
  Administered 2024-12-07 – 2024-12-11 (×5): 240 mg via ORAL
  Filled 2024-12-07 (×4): qty 1
  Filled 2024-12-07: qty 2

## 2024-12-07 MED ORDER — DOCUSATE SODIUM 100 MG PO CAPS
200.0000 mg | ORAL_CAPSULE | Freq: Every day | ORAL | Status: DC
  Administered 2024-12-08 – 2024-12-12 (×4): 200 mg via ORAL
  Filled 2024-12-07 (×5): qty 2

## 2024-12-07 MED ORDER — LOSARTAN POTASSIUM-HCTZ 100-25 MG PO TABS: 1.0000 | ORAL_TABLET | Freq: Every day | ORAL | Status: DC

## 2024-12-07 MED ORDER — PANTOPRAZOLE SODIUM 40 MG PO TBEC
40.0000 mg | DELAYED_RELEASE_TABLET | Freq: Every day | ORAL | Status: DC
  Administered 2024-12-08 – 2024-12-12 (×4): 40 mg via ORAL
  Filled 2024-12-07 (×5): qty 1

## 2024-12-07 MED ORDER — TAMSULOSIN HCL 0.4 MG PO CAPS
0.4000 mg | ORAL_CAPSULE | Freq: Every day | ORAL | Status: DC
  Administered 2024-12-08 – 2024-12-09 (×2): 0.4 mg via ORAL
  Filled 2024-12-07 (×2): qty 1

## 2024-12-07 MED ORDER — DILTIAZEM HCL ER 240 MG PO CP24: 240.0000 mg | ORAL_CAPSULE | Freq: Every day | ORAL | Status: DC

## 2024-12-07 MED ORDER — SODIUM CHLORIDE 0.9 % IR SOLN
3000.0000 mL | Status: DC
  Administered 2024-12-07 – 2024-12-10 (×2): 3000 mL

## 2024-12-07 MED ORDER — ONDANSETRON HCL 4 MG/2ML IJ SOLN
4.0000 mg | Freq: Four times a day (QID) | INTRAMUSCULAR | Status: DC | PRN
  Administered 2024-12-11: 4 mg via INTRAVENOUS
  Filled 2024-12-07: qty 2

## 2024-12-07 MED ORDER — ATORVASTATIN CALCIUM 10 MG PO TABS
20.0000 mg | ORAL_TABLET | Freq: Every day | ORAL | Status: DC
  Administered 2024-12-07 – 2024-12-11 (×5): 20 mg via ORAL
  Filled 2024-12-07 (×5): qty 2

## 2024-12-07 MED ORDER — TADALAFIL 5 MG PO TABS
5.0000 mg | ORAL_TABLET | Freq: Every day | ORAL | Status: DC
  Administered 2024-12-08 – 2024-12-12 (×5): 5 mg via ORAL
  Filled 2024-12-07 (×5): qty 1

## 2024-12-07 NOTE — Assessment & Plan Note (Addendum)
-   Home regimen continued at discharge

## 2024-12-07 NOTE — Assessment & Plan Note (Addendum)
 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 Plan for surgery in January 2026

## 2024-12-07 NOTE — ED Notes (Signed)
 Decent sized clot observed in leg bag followed by bloody urine return.   Bladder scan - 0.   Catheter irrigated with return.   Pt verbalizes great relief. Requesting supplies and a little more education on irrigating at home.

## 2024-12-07 NOTE — Assessment & Plan Note (Signed)
 Patient will continue tums Ultra

## 2024-12-07 NOTE — Discharge Instructions (Signed)
 Please hand irrigate the catheter as we have show new if this becomes blocked again.  If you are unable to relieve the obstruction please return to the emergency department.  Please keep your follow-up appointment with the urology doctors on Wednesday.

## 2024-12-07 NOTE — Assessment & Plan Note (Signed)
 History of DVT, believed to be provoked Completed course of eliquis .

## 2024-12-07 NOTE — H&P (Signed)
 " History and Physical    Patient: Jonathan Lane FMW:[id number] DOB: [birth year] DOA: 12/07/2024 DOS: the patient was seen and examined on 12/07/2024 PCP: Chrystal Lamarr RAMAN, MD  Patient coming from: Home - lives alone, but has a fiance. Ambulates independently.    Chief Complaint: blocked catheter   HPI: Jonathan Lane is a 83 y.o. 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 This is his 4th ED visit since 12/04/24.  He had a cystoscopy on 12/04/24. This was his routine 6 month surveillance cystoscopy.  He states they found 3 lesions Thursday and is scheduled for surgery in January. He was sent home without a catheter. He was fine after this until about 8pm and was unable to urinate. He came to ED evening of 12/04/24. He then had a foley placed at this time with irrigation and was sent home with foley. He states on Saturday he was unable to clear out the clots in the foley. He came back to ED and catheter was irrigated. He was sent home with irrigation supplies. Sunday morning he said there was so many blood clots that the port wouldn't stay clear so he came back to ED. He had irrigation again and was trained on irrigation and sent home. He states he went home and even though he was irrigating the urge to urinate was so strong that urine was coming out of his penis. This happened 2-3 times and was bloody urine. He called on call [facility] and was told he was irrigating out of the wrong port. She advised him to come back to ED. No fevers /chills. No N/V/D. He has had decreased PO intake.   Last took plavix  12/20  Denies any fever/chills, vision changes/headaches, chest pain or palpitations, shortness of breath or cough, abdominal pain, N/V/D,leg swelling.   He does not smoke or drink  alcohol.   ER Course:  vitals: afebrile, bp: 180/73, HR: 65, RR: 18, oxygen: 95% RA Pertinent labs: potassium: 3.4 In ED: started on continuous bladder irrigation. TR[facility] asked to admit.    Review of Systems: As mentioned in the history of present illness. All other systems reviewed and are negative. Past Medical History:  Diagnosis Date   Atrial fibrillation (HCC)    Blood clot in vein    2023 after surgery, dvt left leg, eliquis  x 6 months   Bradycardia    Cancer (HCC)    bladder   Cardiac murmur    GERD (gastroesophageal reflux disease)    Glaucoma    Heart palpitations    Hyperlipidemia    Hypertension    Iliac artery aneurysm    Right   Macular degeneration    Numbness 08/26/2019   LEFT FACE   Pneumonia    yrs ago   PVC (premature ventricular contraction)    Skin abnormalities    pre cancerous lesion R hand   Skin cancer    Sleep apnea    uses cpap   Snoring    Past Surgical History:  Procedure Laterality Date   APPENDECTOMY  12/19/1963   BIOPSY  10/10/2023   Procedure: BIOPSY;  Surgeon: Wilhelmenia Aloha Raddle., MD;  Location: [facility];  Service: Gastroenterology;;   CHOLECYSTECTOMY N/A 10/11/2023   Procedure: LAPAROSCOPIC CHOLECYSTECTOMY;  Surgeon: Rubin Calamity, MD;  Location: [facility];  Service: General;  Laterality: N/A;   COLONOSCOPY     CYSTOSCOPY W/ RETROGRADES Bilateral 05/29/2024   Procedure: CYSTOSCOPY, WITH RETROGRADE PYELOGRAM;  Surgeon: Alvaro Ricardo KATHEE Mickey., MD;  Location: [facility];  Service: Urology;  Laterality: Bilateral;   CYSTOSCOPY WITH BIOPSY N/A 05/16/2022   Procedure: CYSTOSCOPY WITH BIOPSY;  Surgeon: Watt Rush, MD;  Location: [facility];  Service: Urology;  Laterality: N/A;   CYSTOSCOPY WITH HOLMIUM LASER LITHOTRIPSY Left 11/02/2023   Procedure: HOLMIUM LASER ABLATION OF LEFT RENAL TUMOR;  Surgeon: Alvaro Ricardo KATHEE Mickey., MD;  Location: [facility];  Service: Urology;  Laterality: Left;   CYSTOSCOPY WITH RETROGRADE PYELOGRAM,  URETEROSCOPY AND STENT PLACEMENT Bilateral 05/16/2022   Procedure: CYSTOSCOPY WITH BILATERAL RETROGRADE PYELOGRAM, LEFT URETEROSCOPY WITH BIOPSY, HOLMIUM LASER OF LEFT URETERAL STONE  AND LEFT STENT EXCHANGE, TUR LEFT URETERAL ORIFACE;  Surgeon: Watt Rush, MD;  Location: [facility];  Service: Urology;  Laterality: Bilateral;  60 MINUTES   CYSTOSCOPY WITH RETROGRADE PYELOGRAM, URETEROSCOPY AND STENT PLACEMENT Left 07/26/2022   Procedure: CYSTOSCOPY WITH RETROGRADE PYELOGRAM, URETEROSCOPY AND STENT EXCHANGE;  Surgeon: Alvaro Ricardo, MD;  Location: [facility];  Service: Urology;  Laterality: Left;   CYSTOSCOPY WITH RETROGRADE PYELOGRAM, URETEROSCOPY AND STENT PLACEMENT Bilateral 11/02/2023   Procedure: CYSTOSCOPY WITH BILATERAL RETROGRADE PYELOGRAM, AND LEFT DIAGNOSTIC URETEROSCOPY;  Surgeon: Alvaro Ricardo KATHEE Mickey., MD;  Location: [facility];  Service: Urology;  Laterality: Bilateral;   CYSTOSCOPY/URETEROSCOPY/HOLMIUM LASER/STENT PLACEMENT Left 05/29/2024   Procedure: CYSTOSCOPY/URETEROSCOPY/HOLMIUM LASER/STENT PLACEMENT;  Surgeon: Alvaro Ricardo KATHEE Mickey., MD;  Location: [facility];  Service: Urology;  Laterality: Left;   ERCP N/A 10/10/2023   Procedure: ENDOSCOPIC RETROGRADE CHOLANGIOPANCREATOGRAPHY (ERCP);  Surgeon: Wilhelmenia Aloha Mickey., MD;  Location: [facility];  Service: Gastroenterology;  Laterality: N/A;   LYMPH NODE DISSECTION Bilateral 07/26/2022   Procedure: LYMPH NODE DISSECTION;  Surgeon: Alvaro Ricardo, MD;  Location: [facility];  Service: Urology;  Laterality: Bilateral;   POLYPECTOMY     REMOVAL OF STONES  10/10/2023   Procedure: REMOVAL OF SLUDGE;  Surgeon: Wilhelmenia Aloha Mickey., MD;  Location: [facility];  Service: Gastroenterology;;   SKIN CANCER EXCISION  04/2023   mohs procedure right leg   SPHINCTEROTOMY  10/10/2023   Procedure: SPHINCTEROTOMY;  Surgeon: Mansouraty, Aloha Mickey., MD;  Location: [facility];  Service: Gastroenterology;;   TONSILLECTOMY     as a child x2    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   Procedure: RESTAGING TRANSURETHRAL RESECTION OF BLADDER TUMOR  (TURBT) LEFT URETEROSCOPY WITH LASER LEFT STENT EXCHANGE RIGHT STENT REMOVAL;  Surgeon: Watt Rush, MD;  Location: [facility];  Service: Urology;  Laterality: Bilateral;   TRANSURETHRAL RESECTION OF BLADDER TUMOR  05/29/2024   Procedure: TURBT (TRANSURETHRAL RESECTION OF BLADDER TUMOR);  Surgeon: Alvaro Ricardo KATHEE Mickey., MD;  Location: [facility];  Service: Urology;;   TRANSURETHRAL RESECTION OF BLADDER TUMOR WITH MITOMYCIN -C Bilateral 01/03/2022   Procedure: CYSTOSCOPY TRANSURETHRAL RESECTION OF BLADDER TUMOR WITH POSSIBLE  BILATERAL RETROGRADES STENT PLACEMENT;  Surgeon: Watt Rush, MD;  Location: [facility];  Service: Urology;  Laterality: Bilateral;   WISDOM TOOTH EXTRACTION     Social History:  reports that he quit smoking about 59 years ago. His smoking use included cigarettes. He has never been exposed to tobacco smoke. He has never used smokeless tobacco. He reports that he does not currently use alcohol. He reports that he does not use drugs.  Allergies[1]  Family History  Problem Relation Age of Onset   Stroke  Father    Stroke Mother    Stroke Sister    Breast cancer Sister    High blood pressure Sister    Diabetes Sister    Colon cancer Other        Uncle   Colon cancer Paternal Uncle    Mitral valve prolapse Other        sugery 09-27-2017   Rectal cancer Neg Hx    Stomach cancer Neg Hx     Prior to Admission medications  Medication Sig Start Date End Date Taking? Authorizing Provider  acetaminophen  (TYLENOL ) 500 MG tablet Take 500 mg by mouth every 8 (eight) hours as needed for moderate pain (pain score 4-6) or mild pain (pain score 1-3).    [provider]  Apoaequorin (PREVAGEN EXTRA STRENGTH) 20 MG CAPS Take 1 capsule by mouth daily.    [provider]  atorvastatin  (LIPITOR ) 20 MG tablet Take 1 tablet (20 mg total) by mouth  daily. Patient taking differently: Take 20 mg by mouth at bedtime. 08/26/19   Fairy Frames, MD  Cholecalciferol 25 MCG (1000 UT) capsule Take 1,000 Units by mouth daily.    [provider]  clopidogrel  (PLAVIX ) 75 MG tablet Take 75 mg by mouth daily.    [provider]  Coenzyme Q-10 200 MG CAPS Take 200 mg by mouth daily.    [provider]  COSOPT  PF 2-0.5 % SOLN ophthalmic solution Place 1 drop into both eyes in the morning and at bedtime.    [provider]  CRANBERRY PO Take 650 mg by mouth daily.    [provider]  cyanocobalamin (VITAMIN B12) 1000 MCG tablet Take 1,000 mcg by mouth daily.    [provider]  diltiazem  (DILT-XR) 240 MG 24 hr capsule Take 1 capsule (240 mg total) by mouth daily. Patient taking differently: Take 240 mg by mouth at bedtime. 03/27/24   Delford Maude BROCKS, MD  docusate sodium  (COLACE) 100 MG capsule Take 1 capsule (100 mg total) by mouth 2 (two) times daily. Patient taking differently: Take 200 mg by mouth daily. 07/26/22   Cory Palma, PA-C  esomeprazole  (NEXIUM ) 20 MG capsule Take 20 mg by mouth 2 (two) times daily before a meal.    [provider]  fluticasone  (FLONASE ) 50 MCG/ACT nasal spray Place 2 sprays into both nostrils daily as needed for allergies or rhinitis.    [provider]  losartan -hydrochlorothiazide  (HYZAAR) 100-25 MG tablet Take 1 tablet by mouth daily. Patient taking differently: Take 1 tablet by mouth at bedtime. 03/27/24   Nishan, Peter C, MD  Melatonin 3 MG CAPS Take 6 capsules by mouth at bedtime.    [provider]  Misc Natural Products (PROSTATE HEALTH) CAPS Take 1 capsule by mouth daily.    [provider]  Multiple Vitamins-Minerals (ADULT GUMMY PO) Take 2 capsules by mouth daily.    [provider]  Multiple Vitamins-Minerals (PRESERVISION AREDS 2+MULTI VIT) CAPS Take 1 capsule by mouth 2 times daily at 12 noon and 4 pm.    [provider]  nitroGLYCERIN  (NITROSTAT ) 0.4 MG SL tablet Place 1 tablet (0.4 mg total) under the tongue every 5 (five) minutes as needed for chest pain. 08/18/19   Regalado, Belkys A, MD  NON FORMULARY Pt uses a cpap nightly    [provider]  Omega-3 Fatty Acids (FISH OIL) 1200 MG CAPS Take 1,200 mg by mouth 2 (two) times daily.    [provider]  OVER THE COUNTER MEDICATION Take 25 mg by mouth daily. Saffron 28 mg    [provider]  Probiotic Product (ALIGN PO) Take 1 capsule by mouth in the morning.    [provider]  RHOPRESSA  0.02 % SOLN Place 1 drop into both eyes at bedtime.  01/08/18   [provider]  senna-docusate (SENOKOT-S) 8.6-50 MG tablet Take 1 tablet by mouth 2 (two) times daily. While taking prescription pain meds to prevent constipation 05/29/24   Alvaro Ricardo KATHEE Raddle., MD  tadalafil  (CIALIS ) 5 MG tablet Take 5 mg by mouth daily.    [provider]  tamsulosin  (FLOMAX ) 0.4 MG CAPS capsule Take 1 capsule (0.4 mg total) by mouth daily. 08/08/22   Cam Morene ORN, MD  traMADol  (ULTRAM ) 50 MG tablet Take 1 tablet (50 mg total) by mouth every 6 (six) hours as needed for moderate pain (pain score 4-6) or severe pain (pain score 7-10) (post-operatively). 05/29/24 05/29/25  Alvaro Ricardo KATHEE Raddle., MD  valACYclovir (VALTREX) 1000 MG tablet Take 1,000 mg by mouth 2 (two) times daily as needed (for outbreaks).    [provider]  Zinc 25 MG TABS Take 25 mg by mouth daily.    [provider]    Physical Exam: Vitals:   12/07/24 1955  BP: (!) 174/96  Pulse: 95  Resp: 20  Temp: 99 F (37.2 C)  TempSrc: Oral  SpO2: 97%   General:  Appears calm and comfortable and is in NAD Eyes:  PERRL, EOMI, normal lids, iris ENT:  HOH, lips & tongue, mmm; appropriate dentition Neck:  no LAD, masses or thyromegaly; no carotid bruits Cardiovascular:  RRR, no m/r/g. No LE edema.  Respiratory:   CTA bilaterally with no  wheezes/rales/rhonchi.  Normal respiratory effort. Abdomen:  soft, NT, ND, NABS Back:   normal alignment, no CVAT Skin:  no rash or induration seen on limited exam Musculoskeletal:  grossly normal tone BUE/BLE, good ROM, no bony abnormality Lower extremity:  No LE edema.  Limited foot exam with no ulcerations.  2+ distal pulses. Psychiatric:  grossly normal mood and affect, speech fluent and appropriate, AOx3 Neurologic:  CN 2-12 grossly intact, moves all extremities in coordinated fashion, sensation intact   Radiological Exams on Admission: Independently reviewed - see discussion in A/P where applicable  No results found.   Labs on Admission: I have personally reviewed the available labs and imaging studies at the time of the admission.  Pertinent labs:   Potassium: 3.4   Assessment and Plan: Principal Problem:   Complication, blocked Foley catheter, initial encounter Active Problems:   Hypokalemia   Essential hypertension   History of DVT (deep vein thrombosis)   Bladder cancer (HCC)   Gastroesophageal reflux disease   OSA on CPAP   TIA (transient ischemic attack)    Assessment and Plan: * Complication, blocked Foley catheter, initial encounter 83 year old with history of bladder and left ureteral cancer s/p urinary retention and foley placement on 12/18 after routine cystoscopy found to have blocked foley catheter -obs to med surg -urology consulted -continuous foley irrigation  -last took plavix  12/20, will hold today with gross hematuria and clots -hgb stable  -cystoscopy with abnormal findings and surgery planned for January  -NPO after midnight in case of procedure    Hypokalemia Check magnesium  Likely from hydrochlorothiazide   Replete and trend   Essential hypertension Elevated, but has not had his home medication  Continue diltiazem  240mg  and losartan -hydrochlorothiazide  100-25mg  Hydralazine PRN  History of DVT (deep vein thrombosis) History of DVT,  believed to be provoked Completed course of eliquis .   Bladder cancer (HCC) Hx of bladder cancer and left ureteral cancer followed by urology  No chemo or radiation  Routine cystoscopy surveillance on 12/18 with 3 abnormal lesions. Plan for surgery in January 2026    Gastroesophageal reflux disease Continue PPI   OSA on CPAP Continue cpap at night   TIA (transient ischemic attack) Has not taken his plavix  since 12/20 Will hold with gross hematuria Continue statin     Advance Care Planning:   Code Status: Full Code    Consultants: Urology: Dr. Norva    Procedures: Continuous bladder irrigation    Antibiotics: None     DVT Prophylaxis: SCDs  Family Communication: none   Severity of Illness: The appropriate patient status for this patient is OBSERVATION. Observation status is judged to be reasonable and necessary in order to provide the required intensity of service to ensure the patient's safety. The patient's presenting symptoms, physical exam findings, and initial radiographic and laboratory data in the context of their medical condition is felt to place them at decreased risk for further clinical deterioration. Furthermore, it is anticipated that the patient will be medically stable for discharge from the hospital within 2 midnights of admission.   Author: Isaiah Geralds, MD 12/07/2024 10:58 PM  For on call review [email or web address].      [1]  Allergies Allergen Reactions   Brimonidine Tartrate-Timolol  Itching, Rash and Other (See Comments)    Itching eyes Other reaction(s): Not available   Brimonidine Tartrate Other (See Comments)    Irritates the eyes and increased pressure   Tetracycline Hcl Other (See Comments)    Doesn't remember reaction-like 55 years ago    Ciprofloxacin Other (See Comments)    Constipation   "

## 2024-12-07 NOTE — ED Triage Notes (Signed)
" °  Patient comes in with stated catheter blockage.  Patient states he hasn't seen much drainage from catheter and has had urine leaking around the catheter itself.  Patient attempted to flush catheter at home but was unsuccessful.  Pain 5/10, pressure.  "

## 2024-12-07 NOTE — Assessment & Plan Note (Signed)
 Continue cpap at night

## 2024-12-07 NOTE — Assessment & Plan Note (Addendum)
Replete and trend 

## 2024-12-07 NOTE — Assessment & Plan Note (Addendum)
-   hx bladder and left ureteral cancer s/p urinary retention and foley placement on 12/18 after routine cystoscopy found to have blocked foley catheter -urology consulted -continue CBI given ongoing hematuria and abdominal discomfort when paused  -last took plavix  12/20; continue holding - cardiology clearance completed; no issues for proceeding with procedure  - s/p cystoscopy and TURBT on 12/10/2024 - foley removed 12/25; monitor for voiding ability and PVR as needed - Plavix  to remain on hold for at least 5 days postop

## 2024-12-07 NOTE — Assessment & Plan Note (Signed)
 Has not taken his plavix  since 12/20 Will hold with gross hematuria Continue statin

## 2024-12-07 NOTE — ED Provider Notes (Signed)
 "  Emergency Department Provider Note   I have reviewed the triage vital signs and the nursing notes.   HISTORY  Chief Complaint Catheter Blockage   HPI Jonathan Lane is a 83 y.o. male with PMH reviewed below presents to the emergency department for evaluation of of blocked catheter.  He is followed by [facility] with catheter in place.  He has had several ED visits recently with hematuria and 1 yesterday with blocked Foley catheter.  It was irrigated and the obstruction was freed.  He was sent home with supplies to irrigate at home but could not get the supplies to work properly and so came back when the clot lodged.  He had irrigation in the ED with a different device and had extensive training on how to do this at home.  The clot was dislodged and he felt immediate relief.  No other symptoms.  He has follow-up with Dr. Alvaro on Wednesday.   Past Medical History:  Diagnosis Date   Atrial fibrillation (HCC)    Blood clot in vein    2023 after surgery, dvt left leg, eliquis  x 6 months   Bradycardia    Cancer (HCC)    bladder   Cardiac murmur    GERD (gastroesophageal reflux disease)    Glaucoma    Heart palpitations    Hyperlipidemia    Hypertension    Iliac artery aneurysm    Right   Macular degeneration    Numbness 08/26/2019   LEFT FACE   Pneumonia    yrs ago   PVC (premature ventricular contraction)    Skin abnormalities    pre cancerous lesion R hand   Skin cancer    Sleep apnea    uses cpap   Snoring     Review of Systems  Constitutional: No fever/chills Cardiovascular: Denies chest pain. Respiratory: Denies shortness of breath. Gastrointestinal: No abdominal pain.  No nausea, no vomiting.   Genitourinary: Positive urinary retention.  Musculoskeletal: Negative for back pain. Skin: Negative for rash. Neurological: Negative for headache.   ____________________________________________   PHYSICAL EXAM:  VITAL SIGNS: ED Triage Vitals   Encounter Vitals Group     BP 12/07/24 0647 (!) 180/73     Pulse Rate 12/07/24 0647 65     Resp 12/07/24 0647 18     Temp 12/07/24 0647 97.8 F (36.6 C)     Temp src --      SpO2 12/07/24 0647 95 %   Constitutional: Alert and oriented. Well appearing and in no acute distress. Eyes: Conjunctivae are normal.  Head: Atraumatic. Nose: No congestion/rhinnorhea. Mouth/Throat: Mucous membranes are moist.  Neck: No stridor.   Cardiovascular: Good peripheral circulation.  Respiratory: Normal respiratory effort.   Gastrointestinal: Soft and nontender. No distention.  Genitourinary: Foley catheter in place with watery red urine in the bag.  There are a few small clots.  Musculoskeletal: No gross deformities of extremities. Neurologic:  Normal speech and language.  Skin:  Skin is warm, dry and intact. No rash noted.  ____________________________________________   PROCEDURES  Procedure(s) performed:   Procedures  None  ____________________________________________   INITIAL IMPRESSION / ASSESSMENT AND PLAN / ED COURSE  Pertinent labs & imaging results that were available during my care of the patient were reviewed by me and considered in my medical decision making (see chart for details).   This patient is Presenting for Evaluation of foley catheter blockage, which does require a range of treatment options, and is a complaint  that involves a moderate risk of morbidity and mortality.  The Differential Diagnoses include hematuria, UTI, symptomatic anemia, etc.  Medical Decision Making: Summary:  Patient presents emergency department Foley catheter obstruction from clot.  This was hand irrigated by the nurse using a Toomey syringe and extensive patient instruction provided at bedside.  The clot was removed and catheter is flowing freely.  Patient has no symptoms.  He feels comfortable using the new equipment to irrigate the catheter himself at home.  He reports having follow-up with  urology on Wednesday.  Discussed home irrigation as needed with ED return if he is unable to relieve any obstruction.  Patient's presentation is most consistent with acute, uncomplicated illness.   Disposition: discharge  ____________________________________________  FINAL CLINICAL IMPRESSION(S) / ED DIAGNOSES  Final diagnoses:  Obstruction of Foley catheter, initial encounter    Note:  This document was prepared using Dragon voice recognition software and may include unintentional dictation errors.  Fonda Law, MD, [facility]    Jamilah Jean, Fonda MATSU, MD 12/07/24 [phone]  "

## 2024-12-07 NOTE — ED Provider Notes (Signed)
 " [place] EMERGENCY DEPARTMENT AT [facility] Provider Note   CSN: [id number] Arrival date & time: 12/07/24  1949     Patient presents with: Blocked Catheter   Jonathan Lane is a 83 y.o. male.   HPI Patient presents in urinary retention and hematuria.  Now with fourth ER visit for this.  Had a cystoscopy with removal of bladder tumor 4 days ago.  Has had some bleeding since but has had recurrent clots and obstruction of the catheter.  Had an attempted home irrigation but recurrent events.  Does not feel lightheaded or dizziness.  No fever.  Has an appoint to see Dr. Alvaro on Wednesday with today being Sunday.  Is on Plavix .   Past Medical History:  Diagnosis Date   Atrial fibrillation (HCC)    Blood clot in vein    2023 after surgery, dvt left leg, eliquis  x 6 months   Bradycardia    Cancer (HCC)    bladder   Cardiac murmur    GERD (gastroesophageal reflux disease)    Glaucoma    Heart palpitations    Hyperlipidemia    Hypertension    Iliac artery aneurysm    Right   Macular degeneration    Numbness 08/26/2019   LEFT FACE   Pneumonia    yrs ago   PVC (premature ventricular contraction)    Skin abnormalities    pre cancerous lesion R hand   Skin cancer    Sleep apnea    uses cpap   Snoring     Prior to Admission medications  Medication Sig Start Date End Date Taking? Authorizing Provider  acetaminophen  (TYLENOL ) 500 MG tablet Take 500 mg by mouth every 8 (eight) hours as needed for moderate pain (pain score 4-6) or mild pain (pain score 1-3).    [provider]  Apoaequorin (PREVAGEN EXTRA STRENGTH) 20 MG CAPS Take 1 capsule by mouth daily.    [provider]  atorvastatin  (LIPITOR ) 20 MG tablet Take 1 tablet (20 mg total) by mouth daily. Patient taking differently: Take 20 mg by mouth at bedtime. 08/26/19   Fairy Frames, MD  Cholecalciferol 25 MCG (1000 UT) capsule Take 1,000 Units by mouth daily.    [provider]   clopidogrel  (PLAVIX ) 75 MG tablet Take 75 mg by mouth daily.    [provider]  Coenzyme Q-10 200 MG CAPS Take 200 mg by mouth daily.    [provider]  COSOPT  PF 2-0.5 % SOLN ophthalmic solution Place 1 drop into both eyes in the morning and at bedtime.    [provider]  CRANBERRY PO Take 650 mg by mouth daily.    [provider]  cyanocobalamin (VITAMIN B12) 1000 MCG tablet Take 1,000 mcg by mouth daily.    [provider]  diltiazem  (DILT-XR) 240 MG 24 hr capsule Take 1 capsule (240 mg total) by mouth daily. Patient taking differently: Take 240 mg by mouth at bedtime. 03/27/24   Delford Maude BROCKS, MD  docusate sodium  (COLACE) 100 MG capsule Take 1 capsule (100 mg total) by mouth 2 (two) times daily. Patient taking differently: Take 200 mg by mouth daily. 07/26/22   Cory Palma, PA-C  esomeprazole  (NEXIUM ) 20 MG capsule Take 20 mg by mouth 2 (two) times daily before a meal.    [provider]  fluticasone  (FLONASE ) 50 MCG/ACT nasal spray Place 2 sprays into both nostrils daily as needed for allergies or rhinitis.    [provider]  losartan -hydrochlorothiazide  (HYZAAR) 100-25 MG tablet Take 1 tablet by mouth daily. Patient taking differently: Take 1 tablet by mouth at bedtime. 03/27/24   Nishan, Peter C, MD  Melatonin 3 MG CAPS Take 6 capsules by mouth at bedtime.    [provider]  Misc Natural Products (PROSTATE HEALTH) CAPS Take 1 capsule by mouth daily.    [provider]  Multiple Vitamins-Minerals (ADULT GUMMY PO) Take 2 capsules by mouth daily.    [provider]  Multiple Vitamins-Minerals (PRESERVISION AREDS 2+MULTI VIT) CAPS Take 1 capsule by mouth 2 times daily at 12 noon and 4 pm.    [provider]  nitroGLYCERIN  (NITROSTAT ) 0.4 MG SL tablet Place 1 tablet (0.4 mg total) under the tongue every 5 (five) minutes as needed for chest pain. 08/18/19   Regalado, Belkys A, MD  NON FORMULARY  Pt uses a cpap nightly    [provider]  Omega-3 Fatty Acids (FISH OIL) 1200 MG CAPS Take 1,200 mg by mouth 2 (two) times daily.    [provider]  OVER THE COUNTER MEDICATION Take 25 mg by mouth daily. Saffron 28 mg    [provider]  Probiotic Product (ALIGN PO) Take 1 capsule by mouth in the morning.    [provider]  RHOPRESSA  0.02 % SOLN Place 1 drop into both eyes at bedtime.  01/08/18   [provider]  senna-docusate (SENOKOT-S) 8.6-50 MG tablet Take 1 tablet by mouth 2 (two) times daily. While taking prescription pain meds to prevent constipation 05/29/24   Alvaro Ricardo KATHEE Raddle., MD  tadalafil  (CIALIS ) 5 MG tablet Take 5 mg by mouth daily.    [provider]  tamsulosin  (FLOMAX ) 0.4 MG CAPS capsule Take 1 capsule (0.4 mg total) by mouth daily. 08/08/22   Cam Morene ORN, MD  traMADol  (ULTRAM ) 50 MG tablet Take 1 tablet (50 mg total) by mouth every 6 (six) hours as needed for moderate pain (pain score 4-6) or severe pain (pain score 7-10) (post-operatively). 05/29/24 05/29/25  Alvaro Ricardo KATHEE Raddle., MD  valACYclovir (VALTREX) 1000 MG tablet Take 1,000 mg by mouth 2 (two) times daily as needed (for outbreaks).    [provider]  Zinc 25 MG TABS Take 25 mg by mouth daily.    [provider]    Allergies: Brimonidine tartrate-timolol , Brimonidine tartrate, Tetracycline hcl, and Ciprofloxacin    Review of Systems  Updated Vital Signs BP (!) 174/96 (BP Location: Left Arm)   Pulse 95   Temp 99 F (37.2 C) (Oral)   Resp 20   SpO2 97%   Physical Exam Vitals and nursing note reviewed.  Abdominal:     Tenderness: There is abdominal tenderness.     Comments: Some suprapubic tenderness.  Genitourinary:    Comments: Foley catheter in place.  Bloody urine in bag. Neurological:     Mental Status: He is alert.     (all labs ordered are listed, but only abnormal results are displayed) Labs Reviewed  CBC -  Abnormal; Notable for the following components:      Result Value   RBC 4.08 (*)    HCT 38.8 (*)    All other components within normal limits  BASIC METABOLIC PANEL WITH GFR    EKG: None  Radiology: No results found.   Irrigation  Date/Time: 12/07/2024 9:00 PM  Performed by: Patsey Lot, MD Authorized by: Patsey Lot, MD  Consent: Verbal consent obtained Risks and benefits: risks, benefits  and alternatives were discussed Patient identity confirmed: verbally with patient Local anesthesia used: no  Anesthesia: Local anesthesia used: no  Sedation: Patient sedated: no  Comments: Irrigation of Foley catheter through drainage port.  Clots expressed.  Did have some bloody urine also.  Recurrent irrigation done with continued bloody urine and some clots.  Flowing more freely after saline through irrigation port.      Medications Ordered in the ED  sodium chloride  irrigation 0.9 % 3,000 mL (3,000 mLs Irrigation New Bag/Given 12/07/24 2126)                                    Medical Decision Making Amount and/or Complexity of Data Reviewed Labs: ordered.  Risk Prescription drug management.   Patient with recurrent obstruction of Foley catheter with clots.  Recent bladder tumor removal.  Has had multiple ER visits for the same.  Irrigated by me with good return of clots.  However bedside ultrasound done and did show some clot in the bladder.  Discussed with Dr. Norva from urology.  Will need CBI.  Discussed admission versus ER irrigation and with the recurrent visits we think that over night will be appropriate with his recurrent visits.  Recommends hospitalist admission and they will see either tonight or tomorrow morning.       Final diagnoses:  Gross hematuria  Obstructed Foley catheter, initial encounter    ED Discharge Orders     None          Patsey Lot, MD 12/07/24 2131  "

## 2024-12-07 NOTE — ED Triage Notes (Signed)
 Patient c/o blocked catheter again. Patient was seen today and yesterday with same complain. Patient states he was talking to Urology today and probably need CBI started.

## 2024-12-08 ENCOUNTER — Encounter (HOSPITAL_COMMUNITY): Payer: Self-pay | Admitting: Family Medicine

## 2024-12-08 DIAGNOSIS — R310 Gross hematuria: Secondary | ICD-10-CM | POA: Diagnosis present

## 2024-12-08 DIAGNOSIS — Z7902 Long term (current) use of antithrombotics/antiplatelets: Secondary | ICD-10-CM | POA: Diagnosis not present

## 2024-12-08 DIAGNOSIS — G4733 Obstructive sleep apnea (adult) (pediatric): Secondary | ICD-10-CM | POA: Diagnosis present

## 2024-12-08 DIAGNOSIS — E785 Hyperlipidemia, unspecified: Secondary | ICD-10-CM | POA: Diagnosis present

## 2024-12-08 DIAGNOSIS — I10 Essential (primary) hypertension: Secondary | ICD-10-CM | POA: Diagnosis present

## 2024-12-08 DIAGNOSIS — H409 Unspecified glaucoma: Secondary | ICD-10-CM | POA: Diagnosis present

## 2024-12-08 DIAGNOSIS — C662 Malignant neoplasm of left ureter: Secondary | ICD-10-CM | POA: Diagnosis present

## 2024-12-08 DIAGNOSIS — Z8673 Personal history of transient ischemic attack (TIA), and cerebral infarction without residual deficits: Secondary | ICD-10-CM

## 2024-12-08 DIAGNOSIS — T83091A Other mechanical complication of indwelling urethral catheter, initial encounter: Secondary | ICD-10-CM | POA: Diagnosis present

## 2024-12-08 DIAGNOSIS — H3530 Unspecified macular degeneration: Secondary | ICD-10-CM | POA: Diagnosis present

## 2024-12-08 DIAGNOSIS — Z881 Allergy status to other antibiotic agents status: Secondary | ICD-10-CM | POA: Diagnosis not present

## 2024-12-08 DIAGNOSIS — C652 Malignant neoplasm of left renal pelvis: Secondary | ICD-10-CM | POA: Diagnosis present

## 2024-12-08 DIAGNOSIS — Z5181 Encounter for therapeutic drug level monitoring: Secondary | ICD-10-CM | POA: Diagnosis not present

## 2024-12-08 DIAGNOSIS — Z85828 Personal history of other malignant neoplasm of skin: Secondary | ICD-10-CM | POA: Diagnosis not present

## 2024-12-08 DIAGNOSIS — Z86718 Personal history of other venous thrombosis and embolism: Secondary | ICD-10-CM | POA: Diagnosis not present

## 2024-12-08 DIAGNOSIS — K219 Gastro-esophageal reflux disease without esophagitis: Secondary | ICD-10-CM | POA: Diagnosis present

## 2024-12-08 DIAGNOSIS — C675 Malignant neoplasm of bladder neck: Secondary | ICD-10-CM | POA: Diagnosis present

## 2024-12-08 DIAGNOSIS — Z01810 Encounter for preprocedural cardiovascular examination: Secondary | ICD-10-CM | POA: Diagnosis not present

## 2024-12-08 DIAGNOSIS — Z906 Acquired absence of other parts of urinary tract: Secondary | ICD-10-CM | POA: Diagnosis not present

## 2024-12-08 DIAGNOSIS — C679 Malignant neoplasm of bladder, unspecified: Secondary | ICD-10-CM | POA: Diagnosis not present

## 2024-12-08 DIAGNOSIS — Z7901 Long term (current) use of anticoagulants: Secondary | ICD-10-CM | POA: Diagnosis not present

## 2024-12-08 DIAGNOSIS — Z87442 Personal history of urinary calculi: Secondary | ICD-10-CM | POA: Diagnosis not present

## 2024-12-08 DIAGNOSIS — Y738 Miscellaneous gastroenterology and urology devices associated with adverse incidents, not elsewhere classified: Secondary | ICD-10-CM | POA: Diagnosis present

## 2024-12-08 DIAGNOSIS — R339 Retention of urine, unspecified: Secondary | ICD-10-CM | POA: Diagnosis present

## 2024-12-08 DIAGNOSIS — Z79899 Other long term (current) drug therapy: Secondary | ICD-10-CM | POA: Diagnosis not present

## 2024-12-08 DIAGNOSIS — Z87891 Personal history of nicotine dependence: Secondary | ICD-10-CM | POA: Diagnosis not present

## 2024-12-08 DIAGNOSIS — E876 Hypokalemia: Secondary | ICD-10-CM | POA: Diagnosis present

## 2024-12-08 DIAGNOSIS — N3289 Other specified disorders of bladder: Secondary | ICD-10-CM | POA: Diagnosis present

## 2024-12-08 DIAGNOSIS — I723 Aneurysm of iliac artery: Secondary | ICD-10-CM | POA: Diagnosis present

## 2024-12-08 LAB — CBC
HCT: 33.4 % — ABNORMAL LOW (ref 39.0–52.0)
Hemoglobin: 11.2 g/dL — ABNORMAL LOW (ref 13.0–17.0)
MCH: 31.8 pg (ref 26.0–34.0)
MCHC: 33.5 g/dL (ref 30.0–36.0)
MCV: 94.9 fL (ref 80.0–100.0)
Platelets: 165 K/uL (ref 150–400)
RBC: 3.52 MIL/uL — ABNORMAL LOW (ref 4.22–5.81)
RDW: 13.7 % (ref 11.5–15.5)
WBC: 6.6 K/uL (ref 4.0–10.5)
nRBC: 0 % (ref 0.0–0.2)

## 2024-12-08 LAB — BASIC METABOLIC PANEL WITH GFR
Anion gap: 8 (ref 5–15)
BUN: 20 mg/dL (ref 8–23)
CO2: 28 mmol/L (ref 22–32)
Calcium: 8.8 mg/dL — ABNORMAL LOW (ref 8.9–10.3)
Chloride: 103 mmol/L (ref 98–111)
Creatinine, Ser: 0.81 mg/dL (ref 0.61–1.24)
GFR, Estimated: >60 mL/min
Glucose, Bld: 127 mg/dL — ABNORMAL HIGH (ref 70–99)
Potassium: 3.2 mmol/L — ABNORMAL LOW (ref 3.5–5.1)
Sodium: 139 mmol/L (ref 135–145)

## 2024-12-08 MED ORDER — LACTULOSE 10 GM/15ML PO SOLN: 30.0000 g | Freq: Two times a day (BID) | ORAL | Status: DC | PRN

## 2024-12-08 MED ORDER — POLYETHYLENE GLYCOL 3350 17 G PO PACK
17.0000 g | PACK | Freq: Every day | ORAL | Status: DC
  Administered 2024-12-08 – 2024-12-12 (×4): 17 g via ORAL
  Filled 2024-12-08 (×4): qty 1

## 2024-12-08 NOTE — Consult Note (Signed)
 Urology Consult   Physician requesting consult: Dr. Patsy  Reason for consult: hematuria   History of Present Illness: Jonathan Lane is a 83 y.o. 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 Alvaro last week and was discharged with foley catheter in place d/t history of post-operative urinary retention. He has had ongoing issued with hematuria and foley clotting off for which he has presented to the ED 4 times. The first three times, the patient's catheter was irrigated to clear and he was discharged with flushing supplies (Urology was not consulted). Last night, pt presented again after having worsening bladder spasms at home and poorly draining catheter.   On arrival to the ED, patient was AFVSS. Cr 0.81, which is baseline. Hgb 13.0 on arrival and 11.2 this morning. His catheter was irrigated in the ED via the drainage port last night and follow-up POC ultrasound showed minimal residual clot in the bladder. He was started on CBI and admitted. CBI was clamped by me at 0430 this morning--urine was clear light pink. It has remained so for several hours.  Past Medical History:  Diagnosis Date   Atrial fibrillation (HCC)    Blood clot in vein    2023 after surgery, dvt left leg, eliquis  x 6 months   Bradycardia    Cancer (HCC)    bladder   Cardiac murmur    GERD (gastroesophageal reflux disease)    Glaucoma    Heart palpitations    Hyperlipidemia    Hypertension    Iliac artery aneurysm    Right   Macular degeneration    Numbness 08/26/2019   LEFT FACE   Pneumonia    yrs ago   PVC (premature ventricular contraction)    Skin abnormalities    pre cancerous lesion R hand   Skin cancer    Sleep apnea    uses cpap   Snoring     Past Surgical History:  Procedure Laterality Date   APPENDECTOMY  12/19/1963   BIOPSY  10/10/2023   Procedure: BIOPSY;  Surgeon:  Wilhelmenia Aloha Raddle., MD;  Location: [facility];  Service: Gastroenterology;;   CHOLECYSTECTOMY N/A 10/11/2023   Procedure: LAPAROSCOPIC CHOLECYSTECTOMY;  Surgeon: Rubin Calamity, MD;  Location: [facility];  Service: General;  Laterality: N/A;   COLONOSCOPY     CYSTOSCOPY W/ RETROGRADES Bilateral 05/29/2024   Procedure: PHYLLIS, WITH RETROGRADE PYELOGRAM;  Surgeon: Alvaro Ricardo KATHEE Raddle., MD;  Location: [facility];  Service: Urology;  Laterality: Bilateral;   CYSTOSCOPY WITH BIOPSY N/A 05/16/2022   Procedure: CYSTOSCOPY WITH BIOPSY;  Surgeon: Watt Rush, MD;  Location: [facility];  Service: Urology;  Laterality: N/A;   CYSTOSCOPY WITH HOLMIUM LASER LITHOTRIPSY Left 11/02/2023   Procedure: HOLMIUM LASER ABLATION OF LEFT RENAL TUMOR;  Surgeon: Alvaro Ricardo KATHEE Raddle., MD;  Location: [facility];  Service: Urology;  Laterality: Left;   CYSTOSCOPY WITH RETROGRADE PYELOGRAM, URETEROSCOPY AND STENT PLACEMENT Bilateral 05/16/2022   Procedure: CYSTOSCOPY WITH BILATERAL RETROGRADE PYELOGRAM, LEFT URETEROSCOPY WITH BIOPSY, HOLMIUM LASER OF LEFT URETERAL STONE  AND LEFT STENT EXCHANGE, TUR LEFT URETERAL ORIFACE;  Surgeon: Watt Rush, MD;  Location: [facility];  Service: Urology;  Laterality: Bilateral;  60 MINUTES   CYSTOSCOPY WITH RETROGRADE PYELOGRAM, URETEROSCOPY AND STENT PLACEMENT Left 07/26/2022   Procedure: CYSTOSCOPY WITH RETROGRADE PYELOGRAM, URETEROSCOPY AND STENT EXCHANGE;  Surgeon: Alvaro Ricardo, MD;  Location: [facility];  Service:  Urology;  Laterality: Left;   CYSTOSCOPY WITH RETROGRADE PYELOGRAM, URETEROSCOPY AND STENT PLACEMENT Bilateral 11/02/2023   Procedure: CYSTOSCOPY WITH BILATERAL RETROGRADE PYELOGRAM, AND LEFT DIAGNOSTIC URETEROSCOPY;  Surgeon: Alvaro Ricardo KATHEE Mickey., MD;  Location: [facility];  Service: Urology;  Laterality: Bilateral;   CYSTOSCOPY/URETEROSCOPY/HOLMIUM LASER/STENT PLACEMENT Left 05/29/2024   Procedure: CYSTOSCOPY/URETEROSCOPY/HOLMIUM LASER/STENT  PLACEMENT;  Surgeon: Alvaro Ricardo KATHEE Mickey., MD;  Location: [facility];  Service: Urology;  Laterality: Left;   ERCP N/A 10/10/2023   Procedure: ENDOSCOPIC RETROGRADE CHOLANGIOPANCREATOGRAPHY (ERCP);  Surgeon: Wilhelmenia Aloha Mickey., MD;  Location: [facility];  Service: Gastroenterology;  Laterality: N/A;   LYMPH NODE DISSECTION Bilateral 07/26/2022   Procedure: LYMPH NODE DISSECTION;  Surgeon: Alvaro Ricardo, MD;  Location: [facility];  Service: Urology;  Laterality: Bilateral;   POLYPECTOMY     REMOVAL OF STONES  10/10/2023   Procedure: REMOVAL OF SLUDGE;  Surgeon: Wilhelmenia Aloha Mickey., MD;  Location: [facility];  Service: Gastroenterology;;   SKIN CANCER EXCISION  04/2023   mohs procedure right leg   SPHINCTEROTOMY  10/10/2023   Procedure: SPHINCTEROTOMY;  Surgeon: Mansouraty, Aloha Mickey., MD;  Location: [facility];  Service: Gastroenterology;;   TONSILLECTOMY     as a child x2   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   Procedure: RESTAGING TRANSURETHRAL RESECTION OF BLADDER TUMOR  (TURBT) LEFT URETEROSCOPY WITH LASER LEFT STENT EXCHANGE RIGHT STENT REMOVAL;  Surgeon: Watt Rush, MD;  Location: [facility];  Service: Urology;  Laterality: Bilateral;   TRANSURETHRAL RESECTION OF BLADDER TUMOR  05/29/2024   Procedure: TURBT (TRANSURETHRAL RESECTION OF BLADDER TUMOR);  Surgeon: Alvaro Ricardo KATHEE Mickey., MD;  Location: [facility];  Service: Urology;;   TRANSURETHRAL RESECTION OF BLADDER TUMOR WITH MITOMYCIN -C Bilateral 01/03/2022   Procedure: CYSTOSCOPY TRANSURETHRAL RESECTION OF BLADDER TUMOR WITH POSSIBLE  BILATERAL RETROGRADES STENT PLACEMENT;  Surgeon: Watt Rush, MD;  Location: [facility];  Service: Urology;  Laterality: Bilateral;   WISDOM TOOTH EXTRACTION      Current Hospital Medications:  Home Meds: Medications Ordered Prior to Encounter[1]   Scheduled Meds:  atorvastatin   20 mg Oral QHS   diltiazem   240 mg Oral QHS   docusate sodium   200 mg Oral Daily   losartan    100 mg Oral QHS   And   hydrochlorothiazide   25 mg Oral QHS   melatonin  5 mg Oral QHS   pantoprazole   40 mg Oral Daily   tadalafil   5 mg Oral Daily   tamsulosin   0.4 mg Oral Daily   Continuous Infusions:  sodium chloride  irrigation     PRN Meds:.acetaminophen  **OR** acetaminophen , ondansetron  **OR** ondansetron  (ZOFRAN ) IV  Allergies: Allergies[2]  Family History  Problem Relation Age of Onset   Stroke Father    Stroke Mother    Stroke Sister    Breast cancer Sister    High blood pressure Sister    Diabetes Sister    Colon cancer Other        Uncle   Colon cancer Paternal Uncle    Mitral valve prolapse Other        sugery 09-27-2017   Rectal cancer Neg Hx    Stomach cancer Neg Hx     Social History:  reports that he quit smoking about 59 years ago. His smoking use included cigarettes. He has never been exposed to tobacco smoke. He has never used smokeless tobacco. He reports that he does not currently use alcohol. He reports that he does not use drugs.  ROS: A  complete review of systems was performed.  All systems are negative except for pertinent findings as noted.  Physical Exam:  Vital signs in last 24 hours: Temp:  [97.9 F (36.6 C)-99 F (37.2 C)] 98.6 F (37 C) (12/22 0421) Pulse Rate:  [58-95] 58 (12/22 0421) Resp:  [16-20] 16 (12/22 0421) BP: (121-174)/(51-96) 121/51 (12/22 0421) SpO2:  [97 %] 97 % (12/21 1955) Weight:  [91.6 kg] 91.6 kg (12/21 2356) Constitutional:  Alert and oriented, No acute distress Cardiovascular: Regular rate and rhythm, No JVD Respiratory: Normal respiratory effort, Lungs clear bilaterally GI: Abdomen is soft, nontender, nondistended, no abdominal masses GU: 3-way foley in place draining clear thin pink urine with CBI clamped  Lymphatic: No lymphadenopathy Neurologic: Grossly intact, no focal deficits Psychiatric: Normal mood and affect  Laboratory Data:  Recent Labs    12/07/24 2111 12/08/24 0453  WBC 8.2 6.6  HGB 13.0  11.2*  HCT 38.8* 33.4*  PLT 184 165    Recent Labs    12/07/24 2111 12/08/24 0453  NA 139 139  K 3.4* 3.2*  CL 103 103  GLUCOSE 117* 127*  BUN 22 20  CALCIUM  9.2 8.8*  CREATININE 0.82 0.81     Results for orders placed or performed during the hospital encounter of 12/07/24 (from the past 24 hours)  CBC     Status: Abnormal   Collection Time: 12/07/24  9:11 PM  Result Value Ref Range   WBC 8.2 4.0 - 10.5 K/uL   RBC 4.08 (L) 4.22 - 5.81 MIL/uL   Hemoglobin 13.0 13.0 - 17.0 g/dL   HCT 61.1 (L) 60.9 - 47.9 %   MCV 95.1 80.0 - 100.0 fL   MCH 31.9 26.0 - 34.0 pg   MCHC 33.5 30.0 - 36.0 g/dL   RDW 86.4 88.4 - 84.4 %   Platelets 184 150 - 400 K/uL   nRBC 0.0 0.0 - 0.2 %  Basic metabolic panel     Status: Abnormal   Collection Time: 12/07/24  9:11 PM  Result Value Ref Range   Sodium 139 135 - 145 mmol/L   Potassium 3.4 (L) 3.5 - 5.1 mmol/L   Chloride 103 98 - 111 mmol/L   CO2 26 22 - 32 mmol/L   Glucose, Bld 117 (H) 70 - 99 mg/dL   BUN 22 8 - 23 mg/dL   Creatinine, Ser 9.17 0.61 - 1.24 mg/dL   Calcium  9.2 8.9 - 10.3 mg/dL   GFR, Estimated >39 >39 mL/min   Anion gap 10 5 - 15  Magnesium      Status: None   Collection Time: 12/07/24 10:26 PM  Result Value Ref Range   Magnesium  1.9 1.7 - 2.4 mg/dL  CBC     Status: Abnormal   Collection Time: 12/08/24  4:53 AM  Result Value Ref Range   WBC 6.6 4.0 - 10.5 K/uL   RBC 3.52 (L) 4.22 - 5.81 MIL/uL   Hemoglobin 11.2 (L) 13.0 - 17.0 g/dL   HCT 66.5 (L) 60.9 - 47.9 %   MCV 94.9 80.0 - 100.0 fL   MCH 31.8 26.0 - 34.0 pg   MCHC 33.5 30.0 - 36.0 g/dL   RDW 86.2 88.4 - 84.4 %   Platelets 165 150 - 400 K/uL   nRBC 0.0 0.0 - 0.2 %  Basic metabolic panel     Status: Abnormal   Collection Time: 12/08/24  4:53 AM  Result Value Ref Range   Sodium 139 135 - 145 mmol/L  Potassium 3.2 (L) 3.5 - 5.1 mmol/L   Chloride 103 98 - 111 mmol/L   CO2 28 22 - 32 mmol/L   Glucose, Bld 127 (H) 70 - 99 mg/dL   BUN 20 8 - 23 mg/dL    Creatinine, Ser 9.18 0.61 - 1.24 mg/dL   Calcium  8.8 (L) 8.9 - 10.3 mg/dL   GFR, Estimated >39 >39 mL/min   Anion gap 8 5 - 15   No results found for this or any previous visit (from the past 240 hours).  Renal Function: Recent Labs    12/04/24 2056 12/07/24 2111 12/08/24 0453  CREATININE 0.94 0.82 0.81   Estimated Creatinine Clearance: 76.7 mL/min (by C-G formula based on SCr of 0.81 mg/dL).  Radiologic Imaging: No results found.  I independently reviewed the above imaging studies.  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 He currently has TURBT and ureteroscopy scheduled for January, He is HDS and renal function and Hgb are reasonable, but he will likely continue to bleed and have issues with voiding until surgery. After discussing with Dr. Alvaro, plan will be to proceed to OR this week (12/24) for TURBT and ureteroscopy.  - Ok for regular diet - NPO at midnight prior to 12/24 - Continue holding Plavix  - Titrate CBI to maintain clear to light pink urine  Maurilio Agar 12/08/2024, 7:59 AM       [1]  No current facility-administered medications on file prior to encounter.   Current Outpatient Medications on File Prior to Encounter  Medication Sig Dispense Refill   acetaminophen  (TYLENOL ) 500 MG tablet Take 500 mg by mouth every 8 (eight) hours as needed for moderate pain (pain score 4-6) or mild pain (pain score 1-3).     Apoaequorin (PREVAGEN EXTRA STRENGTH) 20 MG CAPS Take 1 capsule by mouth daily.     atorvastatin  (LIPITOR ) 20 MG tablet Take 1 tablet (20 mg total) by mouth daily. (Patient taking differently: Take 20 mg by mouth at bedtime.) 30 tablet 0   clopidogrel  (PLAVIX ) 75 MG tablet Take 75 mg by mouth daily.     Coenzyme Q-10 200 MG CAPS Take 200 mg by mouth daily.     COSOPT  PF 2-0.5 % SOLN ophthalmic solution Place 1 drop into both eyes in the morning and at bedtime.     CRANBERRY  PO Take 650 mg by mouth daily.     cyanocobalamin (VITAMIN B12) 1000 MCG tablet Take 1,000 mcg by mouth daily.     diltiazem  (DILT-XR) 240 MG 24 hr capsule Take 1 capsule (240 mg total) by mouth daily. (Patient taking differently: Take 240 mg by mouth at bedtime.) 90 capsule 2   docusate sodium  (COLACE) 100 MG capsule Take 1 capsule (100 mg total) by mouth 2 (two) times daily. (Patient taking differently: Take 200 mg by mouth daily.)     esomeprazole  (NEXIUM ) 20 MG capsule Take 20 mg by mouth 2 (two) times daily before a meal.     fluticasone  (FLONASE ) 50 MCG/ACT nasal spray Place 2 sprays into both nostrils daily as needed for allergies or rhinitis.     losartan -hydrochlorothiazide  (HYZAAR) 100-25 MG tablet Take 1 tablet by mouth daily. (Patient taking differently: Take 1 tablet by mouth at bedtime.) 90 tablet 2   Melatonin 3 MG CAPS Take 6 capsules by mouth at bedtime.     Misc Natural Products (PROSTATE HEALTH) CAPS Take 1 capsule by mouth daily.  Multiple Vitamins-Minerals (ADULT GUMMY PO) Take 2 capsules by mouth daily.     Multiple Vitamins-Minerals (PRESERVISION AREDS 2+MULTI VIT) CAPS Take 1 capsule by mouth 2 times daily at 12 noon and 4 pm.     nitroGLYCERIN  (NITROSTAT ) 0.4 MG SL tablet Place 1 tablet (0.4 mg total) under the tongue every 5 (five) minutes as needed for chest pain. 10 tablet 1   NON FORMULARY Pt uses a cpap nightly     Omega-3 Fatty Acids (FISH OIL) 1200 MG CAPS Take 1,200 mg by mouth 2 (two) times daily.     OVER THE COUNTER MEDICATION Take 25 mg by mouth daily. Saffron 28 mg     RHOPRESSA  0.02 % SOLN Place 1 drop into both eyes at bedtime.      tadalafil  (CIALIS ) 5 MG tablet Take 5 mg by mouth daily.     tamsulosin  (FLOMAX ) 0.4 MG CAPS capsule Take 1 capsule (0.4 mg total) by mouth daily. (Patient taking differently: Take 0.4 mg by mouth in the morning and at bedtime.) 30 capsule 0   valACYclovir (VALTREX) 1000 MG tablet Take 1,000 mg by mouth 2 (two) times daily as  needed (for outbreaks).     senna-docusate (SENOKOT-S) 8.6-50 MG tablet Take 1 tablet by mouth 2 (two) times daily. While taking prescription pain meds to prevent constipation (Patient not taking: Reported on 12/07/2024) 10 tablet 0   traMADol  (ULTRAM ) 50 MG tablet Take 1 tablet (50 mg total) by mouth every 6 (six) hours as needed for moderate pain (pain score 4-6) or severe pain (pain score 7-10) (post-operatively). (Patient not taking: Reported on 12/07/2024) 15 tablet 0  [2]  Allergies Allergen Reactions   Brimonidine Tartrate-Timolol  Itching, Rash and Other (See Comments)    Itching eyes Other reaction(s): Not available   Brimonidine Tartrate Other (See Comments)    Irritates the eyes and increased pressure   Tetracycline Hcl Other (See Comments)    Doesn't remember reaction-like 55 years ago    Ciprofloxacin Other (See Comments)    Constipation

## 2024-12-08 NOTE — Hospital Course (Addendum)
 Jonathan Lane is a 83 y.o. 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   This is his 4th ED visit since 12/04/24.  He had a cystoscopy on 12/04/24. This was his routine 6 month surveillance cystoscopy.  He states they found 3 lesions Thursday and is scheduled for surgery in January.  He was sent home without a catheter. He was fine after this until about 8pm, then was unable to urinate. He came to ED evening of 12/04/24. He then had a foley placed at this time with irrigation and was sent home with foley. He states on Saturday he was unable to clear out the clots in the foley. He came back to ED and catheter was irrigated. He was sent home with irrigation supplies.  Sunday morning he said there were so many blood clots that the port wouldn't stay clear so he came back to ED. He had irrigation again and was trained on irrigation and sent home. He states he went home and even though he was irrigating the urge to urinate was so strong that urine was coming out of his penis. This happened 2-3 times and was bloody urine. He called on call [facility] and was told he was irrigating out of the wrong port. She advised him to come back to ED. No fevers /chills. No N/V/D. He has had decreased PO intake.    Last took plavix  12/20.   He was started on CBI on admission and urology was consulted. He ultimately underwent TURBT on 12/10/2024.

## 2024-12-08 NOTE — Telephone Encounter (Addendum)
 Helping in outpatient preop today. Chart reviewed; surgery date updated 12/10/24. Patient has not been seen in over a year, therefore previously recommended for in-office visit, but is now in the hospital. Cannot do VV while patient actively in the hospital.  Per chart review, patient is on Plavix  for non-cardiac reasons (TIA) and we do not prescribe. Vascular surgery note also references continuation of ASA for hx of iliac artery aneurysm. Therefore decision to hold clearance, if needed, would be at the discretion of PCP/vascular surgery. IM notes indicate patient already holding regardless in setting of hematuria.  Reached out to Dr. Patsy about whether cardiac clearance still needed in which case we will need to get inpatient team to see. He will review and let me know.

## 2024-12-08 NOTE — Telephone Encounter (Signed)
 Callback team - please let pt know that his inpatient medical team reached out to the inpatient cardiology team and Dr. Delford will see him in consultation in the morning - tentatively Dr Delford feels he should be OK for surgery but will consult tomorrow at [place]. We cannot do a virtual visit while he is in the hospital but appreciate the offer. I also already relayed the FYI regarding Plavix  indication to Dr. Patsy as he is not on this for cardiac reasons.  Will remove from preop APP box.

## 2024-12-08 NOTE — Telephone Encounter (Signed)
 Per preop APP I called the pt and I left a message that Dr. Nishan will be rounding tomorrow morning. See notes from preop APP Raphael Bring, PAC.

## 2024-12-08 NOTE — Assessment & Plan Note (Signed)
-   follows with [facility]; last seen Sept 2023 - per last note, no intervention would be considered unless aneurysm reached 3.5 cm and last measurement was 1.5 cm; he was recommended for repeat imaging in about 2 years from last visit -He has undergone repeat imaging on 10/17/2024.  Right CIA mid and distal measuring 1.5 and 1.6 cm respectively; largest transverse diameter 1.6 cm distal right CIA -He was evaluated by vascular surgery also on 10/17/2024.  Recommended to continue statin and Plavix  and repeat imaging again in 2 more years

## 2024-12-08 NOTE — Consult Note (Signed)
 CARDIOLOGY CONSULT NOTE       Patient ID: Jonathan Lane MRN: [id number] DOB/AGE: July 17, 1941 83 y.o.  Admit date: 12/07/2024 Referring Physician: Manny/Girguis Primary Physician: Chrystal Lamarr RAMAN, MD Primary Cardiologist: Delford Reason for Consultation: Anticoagulation/Preoperative  Principal Problem:   Complication, blocked Foley catheter, initial encounter Active Problems:   Essential hypertension   OSA on CPAP   Gastroesophageal reflux disease   Hypokalemia   History of TIA (transient ischemic attack)   Bladder cancer (HCC)   History of DVT (deep vein thrombosis)   Aneurysm of right common iliac artery   HPI:  83 y.o. admitted with bladder obstruction. Has been Rx with cystoscopies Has bladder cancer and sees Dr Alvaro. Last cystoscopy done 12/04/24 with recurrent cancer. Was scheduled for ablative surgery in January but admitted with retention. Has had retention with clots in past. Post bladder irrigation yesterday. He has no active cardiac issues. Prior eliquis  use for DVT. He sees [facility] for right common iliac aneurysm and was on ASA/Plavix . 5 years ago when his wife died ? TIA but in talking with him nothing found on carotids/MRI. Been on plavix  since then. At some point he was transitioned to plavix  only which has been held since Thursday.   ROS All other systems reviewed and negative except as noted above  Past Medical History:  Diagnosis Date   Atrial fibrillation (HCC)    Blood clot in vein    2023 after surgery, dvt left leg, eliquis  x 6 months   Bradycardia    Cancer (HCC)    bladder   Cardiac murmur    GERD (gastroesophageal reflux disease)    Glaucoma    Heart palpitations    Hyperlipidemia    Hypertension    Iliac artery aneurysm    Right   Macular degeneration    Numbness 08/26/2019   LEFT FACE   Pneumonia    yrs ago   PVC (premature ventricular contraction)    Skin abnormalities    pre cancerous lesion R hand   Skin cancer    Sleep  apnea    uses cpap   Snoring     Family History  Problem Relation Age of Onset   Stroke Father    Stroke Mother    Stroke Sister    Breast cancer Sister    High blood pressure Sister    Diabetes Sister    Colon cancer Other        Uncle   Colon cancer Paternal Uncle    Mitral valve prolapse Other        sugery 09-27-2017   Rectal cancer Neg Hx    Stomach cancer Neg Hx     Social History   Socioeconomic History   Marital status: Widowed    Spouse name: Avel   Number of children: 1   Years of education: Masters   Highest education level: Not on file  Occupational History    Employer: RETIRED  Tobacco Use   Smoking status: Former    Current packs/day: 0.00    Types: Cigarettes    Quit date: 12/18/1965    Years since quitting: 59.0    Passive exposure: Never   Smokeless tobacco: Never  Vaping Use   Vaping status: Never Used  Substance and Sexual Activity   Alcohol use: Not Currently   Drug use: No   Sexual activity: Not on file  Other Topics Concern   Not on file  Social History Narrative   Patient is married (  Lynda).   Patient is retired.   Patient has one adult child.   Patient does not drink any caffeine.   Patient is right-handed.   Patient has a Event organiser.            Social Drivers of Health   Tobacco Use: Medium Risk (12/08/2024)   Patient History    Smoking Tobacco Use: Former    Smokeless Tobacco Use: Never    Passive Exposure: Never  Programmer, Applications: Not on file  Food Insecurity: No Food Insecurity (12/08/2024)   Epic    Worried About Programme Researcher, Broadcasting/film/video in the Last Year: Never true    Ran Out of Food in the Last Year: Never true  Transportation Needs: No Transportation Needs (12/08/2024)   Epic    Lack of Transportation (Medical): No    Lack of Transportation (Non-Medical): No  Physical Activity: Not on file  Stress: Not on file  Social Connections: Not on file  Intimate Partner Violence: Not At Risk (12/08/2024)   Epic     Fear of Current or Ex-Partner: No    Emotionally Abused: No    Physically Abused: No    Sexually Abused: No  Depression (PHQ2-9): Not on file  Alcohol Screen: Not on file  Housing: Low Risk (12/08/2024)   Epic    Unable to Pay for Housing in the Last Year: No    Number of Times Moved in the Last Year: 0    Homeless in the Last Year: No  Utilities: Not At Risk (12/08/2024)   Epic    Threatened with loss of utilities: No  Health Literacy: Not on file    Past Surgical History:  Procedure Laterality Date   APPENDECTOMY  12/19/1963   BIOPSY  10/10/2023   Procedure: BIOPSY;  Surgeon: Wilhelmenia Aloha Raddle., MD;  Location: [facility];  Service: Gastroenterology;;   CHOLECYSTECTOMY N/A 10/11/2023   Procedure: LAPAROSCOPIC CHOLECYSTECTOMY;  Surgeon: Rubin Calamity, MD;  Location: [facility];  Service: General;  Laterality: N/A;   COLONOSCOPY     CYSTOSCOPY W/ RETROGRADES Bilateral 05/29/2024   Procedure: PHYLLIS, WITH RETROGRADE PYELOGRAM;  Surgeon: Alvaro Ricardo KATHEE Raddle., MD;  Location: [facility];  Service: Urology;  Laterality: Bilateral;   CYSTOSCOPY WITH BIOPSY N/A 05/16/2022   Procedure: CYSTOSCOPY WITH BIOPSY;  Surgeon: Watt Rush, MD;  Location: [facility];  Service: Urology;  Laterality: N/A;   CYSTOSCOPY WITH HOLMIUM LASER LITHOTRIPSY Left 11/02/2023   Procedure: HOLMIUM LASER ABLATION OF LEFT RENAL TUMOR;  Surgeon: Alvaro Ricardo KATHEE Raddle., MD;  Location: [facility];  Service: Urology;  Laterality: Left;   CYSTOSCOPY WITH RETROGRADE PYELOGRAM, URETEROSCOPY AND STENT PLACEMENT Bilateral 05/16/2022   Procedure: CYSTOSCOPY WITH BILATERAL RETROGRADE PYELOGRAM, LEFT URETEROSCOPY WITH BIOPSY, HOLMIUM LASER OF LEFT URETERAL STONE  AND LEFT STENT EXCHANGE, TUR LEFT URETERAL ORIFACE;  Surgeon: Watt Rush, MD;  Location: [facility];  Service: Urology;  Laterality: Bilateral;  60 MINUTES   CYSTOSCOPY WITH RETROGRADE PYELOGRAM, URETEROSCOPY AND STENT PLACEMENT Left 07/26/2022    Procedure: CYSTOSCOPY WITH RETROGRADE PYELOGRAM, URETEROSCOPY AND STENT EXCHANGE;  Surgeon: Alvaro Ricardo, MD;  Location: [facility];  Service: Urology;  Laterality: Left;   CYSTOSCOPY WITH RETROGRADE PYELOGRAM, URETEROSCOPY AND STENT PLACEMENT Bilateral 11/02/2023   Procedure: CYSTOSCOPY WITH BILATERAL RETROGRADE PYELOGRAM, AND LEFT DIAGNOSTIC URETEROSCOPY;  Surgeon: Alvaro Ricardo KATHEE Raddle., MD;  Location: [facility];  Service: Urology;  Laterality: Bilateral;   CYSTOSCOPY/URETEROSCOPY/HOLMIUM LASER/STENT PLACEMENT Left 05/29/2024   Procedure: CYSTOSCOPY/URETEROSCOPY/HOLMIUM LASER/STENT PLACEMENT;  Surgeon: Alvaro Ricardo KATHEE Mickey., MD;  Location: [facility];  Service: Urology;  Laterality: Left;   ERCP N/A 10/10/2023   Procedure: ENDOSCOPIC RETROGRADE CHOLANGIOPANCREATOGRAPHY (ERCP);  Surgeon: Wilhelmenia Aloha Mickey., MD;  Location: [facility];  Service: Gastroenterology;  Laterality: N/A;   LYMPH NODE DISSECTION Bilateral 07/26/2022   Procedure: LYMPH NODE DISSECTION;  Surgeon: Alvaro Ricardo, MD;  Location: [facility];  Service: Urology;  Laterality: Bilateral;   POLYPECTOMY     REMOVAL OF STONES  10/10/2023   Procedure: REMOVAL OF SLUDGE;  Surgeon: Wilhelmenia Aloha Mickey., MD;  Location: [facility];  Service: Gastroenterology;;   SKIN CANCER EXCISION  04/2023   mohs procedure right leg   SPHINCTEROTOMY  10/10/2023   Procedure: SPHINCTEROTOMY;  Surgeon: Mansouraty, Aloha Mickey., MD;  Location: [facility];  Service: Gastroenterology;;   TONSILLECTOMY     as a child x2   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   Procedure: RESTAGING TRANSURETHRAL RESECTION OF BLADDER TUMOR  (TURBT) LEFT URETEROSCOPY WITH LASER LEFT STENT EXCHANGE RIGHT STENT REMOVAL;  Surgeon: Watt Rush, MD;  Location: [facility];  Service: Urology;  Laterality: Bilateral;   TRANSURETHRAL RESECTION OF BLADDER TUMOR  05/29/2024   Procedure: TURBT (TRANSURETHRAL RESECTION OF BLADDER TUMOR);  Surgeon:  Alvaro Ricardo KATHEE Mickey., MD;  Location: [facility];  Service: Urology;;   TRANSURETHRAL RESECTION OF BLADDER TUMOR WITH MITOMYCIN -C Bilateral 01/03/2022   Procedure: CYSTOSCOPY TRANSURETHRAL RESECTION OF BLADDER TUMOR WITH POSSIBLE  BILATERAL RETROGRADES STENT PLACEMENT;  Surgeon: Watt Rush, MD;  Location: [facility];  Service: Urology;  Laterality: Bilateral;   WISDOM TOOTH EXTRACTION       Current Medications[1]  atorvastatin   20 mg Oral QHS   diltiazem   240 mg Oral QHS   docusate sodium   200 mg Oral Daily   losartan   100 mg Oral QHS   And   hydrochlorothiazide   25 mg Oral QHS   melatonin  5 mg Oral QHS   pantoprazole   40 mg Oral Daily   polyethylene glycol  17 g Oral Daily   tadalafil   5 mg Oral Daily   tamsulosin   0.4 mg Oral Daily    sodium chloride  irrigation      Physical Exam: Blood pressure (!) 149/59, pulse 74, temperature 98.3 F (36.8 C), resp. rate 18, height 6' 1 (1.854 m), weight 91.6 kg, SpO2 94%.    Affect appropriate Healthy:  appears stated age HEENT: normal Neck supple with no adenopathy JVP normal no bruits no thyromegaly Lungs clear with no wheezing and good diaphragmatic motion Heart:  S1/S2 no murmur, no rub, gallop or click PMI normal Abdomen: benighn, BS positve, no tenderness, no AAA no bruit.  No HSM or HJR Distal pulses intact with no bruits No edema Neuro non-focal Skin warm and dry No muscular weakness   Labs:   Lab Results  Component Value Date   WBC 6.6 12/08/2024   HGB 11.2 (L) 12/08/2024   HCT 33.4 (L) 12/08/2024   MCV 94.9 12/08/2024   PLT 165 12/08/2024    Recent Labs  Lab 12/08/24 0453  NA 139  K 3.2*  CL 103  CO2 28  BUN 20  CREATININE 0.81  CALCIUM  8.8*  GLUCOSE 127*   No results found for: CKTOTAL, CKMB, CKMBINDEX, TROPONINI  Lab Results  Component Value Date   CHOL 87 10/06/2023   CHOL 123 08/18/2019   CHOL 182 07/24/2007   Lab Results  Component Value Date   HDL 32 (L) 10/06/2023   HDL 37 (  L)  08/18/2019   HDL 35.1 (L) 07/24/2007   Lab Results  Component Value Date   LDLCALC 46 10/06/2023   LDLCALC 75 08/18/2019   LDLCALC 122 (H) 07/24/2007   Lab Results  Component Value Date   TRIG 44 10/06/2023   TRIG 54 08/18/2019   TRIG 125 07/24/2007   Lab Results  Component Value Date   CHOLHDL 2.7 10/06/2023   CHOLHDL 3.3 08/18/2019   CHOLHDL 5.2 CALC 07/24/2007   No results found for: LDLDIRECT    Radiology: No results found.  EKG: SR rate 63 PAC    ASSESSMENT AND PLAN:   Preoperative:  ok to proceed with cystoscopy off ASA/Plavix  12/24. From cardiac perspective does not need to be on either. Depending on how difficult cancer lesions are to ablate can stay of Plavix  for 5 days or indefinitely from my perspective as both indications for plavix  are soft Patient should be d/c with foley or at least learn how to self cath himself to avoid recurrent ER visits for obstruction Urology can discuss this with him. He is on flomax  which will be continued  HTN:  continue cardizem  and losartan   HLD:  continue statin  Signed: Wyett Narine 12/08/2024, 3:27 PM      [1]  Current Facility-Administered Medications:    acetaminophen  (TYLENOL ) tablet 650 mg, 650 mg, Oral, Q6H PRN **OR** acetaminophen  (TYLENOL ) suppository 650 mg, 650 mg, Rectal, Q6H PRN, Waddell Rake, MD   atorvastatin  (LIPITOR ) tablet 20 mg, 20 mg, Oral, QHS, Waddell Rake, MD, 20 mg at 12/07/24 2323   diltiazem  (CARDIZEM  CD) 24 hr capsule 240 mg, 240 mg, Oral, QHS, Waddell Rake, MD, 240 mg at 12/07/24 2340   docusate sodium  (COLACE) capsule 200 mg, 200 mg, Oral, Daily, Waddell Rake, MD, 200 mg at 12/08/24 1051   losartan  (COZAAR ) tablet 100 mg, 100 mg, Oral, QHS, 100 mg at 12/07/24 2340 **AND** hydrochlorothiazide  (HYDRODIURIL ) tablet 25 mg, 25 mg, Oral, QHS, Waddell Rake, MD, 25 mg at 12/07/24 2340   lactulose  (CHRONULAC ) 10 GM/15ML solution 30 g, 30 g, Oral, BID PRN, Girguis, David, MD   melatonin tablet  5 mg, 5 mg, Oral, QHS, Waddell Rake, MD, 5 mg at 12/07/24 2323   ondansetron  (ZOFRAN ) tablet 4 mg, 4 mg, Oral, Q6H PRN **OR** ondansetron  (ZOFRAN ) injection 4 mg, 4 mg, Intravenous, Q6H PRN, Waddell Rake, MD   pantoprazole  (PROTONIX ) EC tablet 40 mg, 40 mg, Oral, Daily, Waddell Rake, MD, 40 mg at 12/08/24 1051   polyethylene glycol (MIRALAX  / GLYCOLAX ) packet 17 g, 17 g, Oral, Daily, Patsy Lenis, MD, 17 g at 12/08/24 1054   Continuous Bladder Irrigation, , , Until Discontinued **AND** sodium chloride  irrigation 0.9 % 3,000 mL, 3,000 mL, Irrigation, Continuous, Pickering, Rankin, MD, 3,000 mL at 12/07/24 2126   tadalafil  (CIALIS ) tablet 5 mg, 5 mg, Oral, Daily, Waddell Rake, MD, 5 mg at 12/08/24 1051   tamsulosin  (FLOMAX ) capsule 0.4 mg, 0.4 mg, Oral, Daily, Waddell Rake, MD, 0.4 mg at 12/08/24 1051

## 2024-12-08 NOTE — Progress Notes (Signed)
 " Progress Note    Jonathan Lane   FMW:[id number]  DOB: 1941/10/14  DOA: 12/07/2024     0 PCP: Chrystal Lamarr RAMAN, MD  Initial CC: Urinary retention and hematuria  Hospital Course: Jonathan Lane is a 83 y.o. 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   This is his 4th ED visit since 12/04/24.  He had a cystoscopy on 12/04/24. This was his routine 6 month surveillance cystoscopy.  He states they found 3 lesions Thursday and is scheduled for surgery in January.  He was sent home without a catheter. He was fine after this until about 8pm, then was unable to urinate. He came to ED evening of 12/04/24. He then had a foley placed at this time with irrigation and was sent home with foley. He states on Saturday he was unable to clear out the clots in the foley. He came back to ED and catheter was irrigated. He was sent home with irrigation supplies.  Sunday morning he said there were so many blood clots that the port wouldn't stay clear so he came back to ED. He had irrigation again and was trained on irrigation and sent home. He states he went home and even though he was irrigating the urge to urinate was so strong that urine was coming out of his penis. This happened 2-3 times and was bloody urine. He called on call [facility] and was told he was irrigating out of the wrong port. She advised him to come back to ED. No fevers /chills. No N/V/D. He has had decreased PO intake.    Last took plavix  12/20.   He was started on CBI on admission and urology was consulted.  Interval History:  No issues overnight.  Significant other present bedside this morning. Complaining of some abdominal discomfort after CBI paused this morning.  Still has hematuria noted in bag.  Assessment and Plan: * Complication, blocked Foley catheter,  initial encounter - hx bladder and left ureteral cancer s/p urinary retention and foley placement on 12/18 after routine cystoscopy found to have blocked foley catheter -urology consulted -continue CBI given ongoing hematuria and abdominal discomfort when paused  -last took plavix  12/20; continue holding - plan is for cystoscopy and potential TURBT on 12/10/2024 - cardiology clearance requested after discussion with urology  Aneurysm of right common iliac artery - follows with [facility]; last seen Sept 2023 - per last note, no intervention would be considered unless aneurysm reached 3.5 cm and last measurement was 1.5 cm; he was recommended for repeat imaging in about 2 years from last visit -He has undergone repeat imaging on 10/17/2024.  Right CIA mid and distal measuring 1.5 and 1.6 cm respectively; largest transverse diameter 1.6 cm distal right CIA -He was evaluated by vascular surgery also on 10/17/2024.  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  MRI negative at that time - He was recommended for DAPT then monotherapy plavix  thereafter and has remained compliant - last dose plavix  12/20; now on hold, see above   History of DVT (deep vein thrombosis) History of DVT, believed to be provoked Completed course of eliquis .   Bladder cancer (  HCC) Hx of bladder cancer and left ureteral cancer followed by urology  No chemo or radiation  Routine cystoscopy surveillance on 12/18 with 3 abnormal lesions - Further management as per urology   Hypokalemia - Replete and trend   Gastroesophageal reflux disease Continue PPI   OSA on CPAP Continue cpap at night   Essential hypertension Elevated, but has not had his home medication  Continue diltiazem  240mg  and losartan -hydrochlorothiazide  100-25mg  Hydralazine PRN     Antimicrobials: N/a  DVT prophylaxis:  SCDs Start: 12/07/24 2223   Code Status:   Code Status: Full Code  Mobility Assessment (Last 72 Hours)     Mobility Assessment     Row Name 12/08/24 0002 12/08/24 0001         Does the patient have exclusion criteria? No- Perform mobility assessment No- Perform mobility assessment      What is the highest level of mobility based on the mobility assessment? Level 5 (Ambulates independently) - Balance while walking independently - Complete Level 4 (Ambulates with assistance) - Balance while stepping forward/back - Complete         Diet: Diet Orders (From admission, onward)     Start     Ordered   12/07/24 2224  Diet regular Room service appropriate? Yes; Fluid consistency: Thin  Diet effective now       Question Answer Comment  Room service appropriate? Yes   Fluid consistency: Thin      12/07/24 2225            Barriers to discharge: none Disposition Plan:  Home  HH orders placed: n/a Status is: Inpt   Objective: Blood pressure (!) 149/59, pulse 74, temperature 98.3 F (36.8 C), resp. rate 18, height 6' 1 (1.854 m), weight 91.6 kg, SpO2 94%.  Examination:  Physical Exam Constitutional:      General: He is not in acute distress.    Appearance: Normal appearance.  HENT:     Head: Normocephalic and atraumatic.     Mouth/Throat:     Mouth: Mucous membranes are moist.  Eyes:     Extraocular Movements: Extraocular movements intact.  Cardiovascular:     Rate and Rhythm: Normal rate and regular rhythm.  Pulmonary:     Effort: Pulmonary effort is normal. No respiratory distress.     Breath sounds: Normal breath sounds. No wheezing.  Abdominal:     General: Bowel sounds are normal. There is no distension.     Palpations: Abdomen is soft.     Tenderness: There is no abdominal tenderness.  Genitourinary:    Comments: Foley in place with hematuria noted in Foley bag Musculoskeletal:        General: Normal range of  motion.     Cervical back: Normal range of motion and neck supple.  Skin:    General: Skin is warm and dry.  Neurological:     General: No focal deficit present.     Mental Status: He is alert.  Psychiatric:        Mood and Affect: Mood normal.        Behavior: Behavior normal.      Consultants:  Urology Cardiology   Procedures:    Data Reviewed: Results for orders placed or performed during the hospital encounter of 12/07/24 (from the past 24 hours)  CBC     Status: Abnormal   Collection Time: 12/07/24  9:11 PM  Result Value Ref Range   WBC 8.2 4.0 - 10.5 K/uL  RBC 4.08 (L) 4.22 - 5.81 MIL/uL   Hemoglobin 13.0 13.0 - 17.0 g/dL   HCT 61.1 (L) 60.9 - 47.9 %   MCV 95.1 80.0 - 100.0 fL   MCH 31.9 26.0 - 34.0 pg   MCHC 33.5 30.0 - 36.0 g/dL   RDW 86.4 88.4 - 84.4 %   Platelets 184 150 - 400 K/uL   nRBC 0.0 0.0 - 0.2 %  Basic metabolic panel     Status: Abnormal   Collection Time: 12/07/24  9:11 PM  Result Value Ref Range   Sodium 139 135 - 145 mmol/L   Potassium 3.4 (L) 3.5 - 5.1 mmol/L   Chloride 103 98 - 111 mmol/L   CO2 26 22 - 32 mmol/L   Glucose, Bld 117 (H) 70 - 99 mg/dL   BUN 22 8 - 23 mg/dL   Creatinine, Ser 9.17 0.61 - 1.24 mg/dL   Calcium  9.2 8.9 - 10.3 mg/dL   GFR, Estimated >39 >39 mL/min   Anion gap 10 5 - 15  Magnesium      Status: None   Collection Time: 12/07/24 10:26 PM  Result Value Ref Range   Magnesium  1.9 1.7 - 2.4 mg/dL  CBC     Status: Abnormal   Collection Time: 12/08/24  4:53 AM  Result Value Ref Range   WBC 6.6 4.0 - 10.5 K/uL   RBC 3.52 (L) 4.22 - 5.81 MIL/uL   Hemoglobin 11.2 (L) 13.0 - 17.0 g/dL   HCT 66.5 (L) 60.9 - 47.9 %   MCV 94.9 80.0 - 100.0 fL   MCH 31.8 26.0 - 34.0 pg   MCHC 33.5 30.0 - 36.0 g/dL   RDW 86.2 88.4 - 84.4 %   Platelets 165 150 - 400 K/uL   nRBC 0.0 0.0 - 0.2 %  Basic metabolic panel     Status: Abnormal   Collection Time: 12/08/24  4:53 AM  Result Value Ref Range   Sodium 139 135 - 145 mmol/L    Potassium 3.2 (L) 3.5 - 5.1 mmol/L   Chloride 103 98 - 111 mmol/L   CO2 28 22 - 32 mmol/L   Glucose, Bld 127 (H) 70 - 99 mg/dL   BUN 20 8 - 23 mg/dL   Creatinine, Ser 9.18 0.61 - 1.24 mg/dL   Calcium  8.8 (L) 8.9 - 10.3 mg/dL   GFR, Estimated >39 >39 mL/min   Anion gap 8 5 - 15    I have reviewed pertinent nursing notes, vitals, labs, and images as necessary. I have ordered labwork to follow up on as indicated.  I have reviewed the last notes from staff over past 24 hours. I have discussed patient's care plan and test results with nursing staff, CM/SW, and other staff as appropriate.  Old records reviewed in assessment of this patient  Time spent: Greater than 50% of the 55 minute visit was spent in counseling/coordination of care for the patient as laid out in the A&P.   LOS: 0 days   Alm Apo, MD [facility] 12/08/2024, 1:54 PM "

## 2024-12-09 DIAGNOSIS — T83091A Other mechanical complication of indwelling urethral catheter, initial encounter: Secondary | ICD-10-CM | POA: Diagnosis not present

## 2024-12-09 LAB — BASIC METABOLIC PANEL WITH GFR
Anion gap: 9 (ref 5–15)
BUN: 16 mg/dL (ref 8–23)
CO2: 26 mmol/L (ref 22–32)
Calcium: 8.9 mg/dL (ref 8.9–10.3)
Chloride: 105 mmol/L (ref 98–111)
Creatinine, Ser: 0.76 mg/dL (ref 0.61–1.24)
GFR, Estimated: >60 mL/min
Glucose, Bld: 107 mg/dL — ABNORMAL HIGH (ref 70–99)
Potassium: 3.1 mmol/L — ABNORMAL LOW (ref 3.5–5.1)
Sodium: 140 mmol/L (ref 135–145)

## 2024-12-09 MED ORDER — CHLORHEXIDINE GLUCONATE CLOTH 2 % EX PADS
6.0000 | MEDICATED_PAD | Freq: Every day | CUTANEOUS | Status: DC
  Administered 2024-12-09 – 2024-12-12 (×3): 6 via TOPICAL

## 2024-12-09 MED ORDER — POTASSIUM CHLORIDE CRYS ER 20 MEQ PO TBCR
40.0000 meq | EXTENDED_RELEASE_TABLET | ORAL | Status: AC
  Administered 2024-12-09 (×2): 40 meq via ORAL
  Filled 2024-12-09 (×2): qty 2

## 2024-12-09 NOTE — Progress Notes (Signed)
" °   12/09/24 0912  TOC Brief Assessment  Insurance and Status Reviewed  Patient has primary care physician Yes  Home environment has been reviewed single family home  Prior level of function: independent  Prior/Current Home Services No current home services  Social Drivers of Health Review SDOH reviewed no interventions necessary  Readmission risk has been reviewed Yes  Transition of care needs transition of care needs identified, TOC will continue to follow    Signed: Heather Saltness, MSW, LCSW Clinical Social Worker Inpatient Care Management 12/09/2024 9:12 AM   "

## 2024-12-09 NOTE — Progress Notes (Signed)
 " Progress Note    Jonathan Lane   FMW:[id number]  DOB: Jun 01, 1941  DOA: 12/07/2024     1 PCP: Chrystal Lamarr RAMAN, MD  Initial CC: Urinary retention and hematuria  Hospital Course: Jonathan Lane is a 83 y.o. 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   This is his 4th ED visit since 12/04/24.  He had a cystoscopy on 12/04/24. This was his routine 6 month surveillance cystoscopy.  He states they found 3 lesions Thursday and is scheduled for surgery in January.  He was sent home without a catheter. He was fine after this until about 8pm, then was unable to urinate. He came to ED evening of 12/04/24. He then had a foley placed at this time with irrigation and was sent home with foley. He states on Saturday he was unable to clear out the clots in the foley. He came back to ED and catheter was irrigated. He was sent home with irrigation supplies.  Sunday morning he said there were so many blood clots that the port wouldn't stay clear so he came back to ED. He had irrigation again and was trained on irrigation and sent home. He states he went home and even though he was irrigating the urge to urinate was so strong that urine was coming out of his penis. This happened 2-3 times and was bloody urine. He called on call [facility] and was told he was irrigating out of the wrong port. She advised him to come back to ED. No fevers /chills. No N/V/D. He has had decreased PO intake.    Last took plavix  12/20.   He was started on CBI on admission and urology was consulted.  Interval History:  No issues overnight.   Urine very light pink this am and feeling much better in general.  Planning for undergoing cystoscopy tomorrow as planned with urology.   Assessment and Plan: * Complication, blocked Foley catheter, initial  encounter - hx bladder and left ureteral cancer s/p urinary retention and foley placement on 12/18 after routine cystoscopy found to have blocked foley catheter -urology consulted -continue CBI given ongoing hematuria and abdominal discomfort when paused  -last took plavix  12/20; continue holding - plan is for cystoscopy and potential TURBT on 12/10/2024 - cardiology clearance completed; no issues for proceeding with procedure   Aneurysm of right common iliac artery - follows with [facility]; last seen Sept 2023 - per last note, no intervention would be considered unless aneurysm reached 3.5 cm and last measurement was 1.5 cm; he was recommended for repeat imaging in about 2 years from last visit -He has undergone repeat imaging on 10/17/2024.  Right CIA mid and distal measuring 1.5 and 1.6 cm respectively; largest transverse diameter 1.6 cm distal right CIA -He was evaluated by vascular surgery also on 10/17/2024.  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  MRI negative at that time - He was recommended for DAPT then monotherapy plavix  thereafter and has remained compliant - last dose plavix  12/20; now on hold, see above   History of DVT (deep vein thrombosis)-resolved as of 12/09/2024 History of DVT, believed to be provoked Completed  course of eliquis .   Bladder cancer (HCC) Hx of bladder cancer and left ureteral cancer followed by urology  No chemo or radiation  Routine cystoscopy surveillance on 12/18 with 3 abnormal lesions - Further management as per urology   Hypokalemia - Replete and trend   Gastroesophageal reflux disease Continue PPI   OSA on CPAP Continue cpap at night   Essential hypertension Elevated, but has not had his home medication  Continue diltiazem  240mg  and losartan -hydrochlorothiazide   100-25mg  Hydralazine PRN    Antimicrobials: N/a  DVT prophylaxis:  SCDs Start: 12/07/24 2223   Code Status:   Code Status: Full Code  Mobility Assessment (Last 72 Hours)     Mobility Assessment     Row Name 12/09/24 1027 12/08/24 1955 12/08/24 0925 12/08/24 0002 12/08/24 0001   Does the patient have exclusion criteria? No- Perform mobility assessment No- Perform mobility assessment No- Perform mobility assessment No- Perform mobility assessment No- Perform mobility assessment   What is the highest level of mobility based on the mobility assessment? Level 5 (Ambulates independently) - Balance while walking independently - Complete Level 5 (Ambulates independently) - Balance while walking independently - Complete Level 5 (Ambulates independently) - Balance while walking independently - Complete Level 5 (Ambulates independently) - Balance while walking independently - Complete Level 4 (Ambulates with assistance) - Balance while stepping forward/back - Complete      Diet: Diet Orders (From admission, onward)     Start     Ordered   12/07/24 2224  Diet regular Room service appropriate? Yes; Fluid consistency: Thin  Diet effective now       Question Answer Comment  Room service appropriate? Yes   Fluid consistency: Thin      12/07/24 2225            Barriers to discharge: none Disposition Plan:  Home  HH orders placed: n/a Status is: Inpt   Objective: Blood pressure 124/64, pulse (!) 58, temperature 98.3 F (36.8 C), temperature source Oral, resp. rate 17, height 6' 1 (1.854 m), weight 91.6 kg, SpO2 97%.  Examination:  Physical Exam Constitutional:      General: He is not in acute distress.    Appearance: Normal appearance.  HENT:     Head: Normocephalic and atraumatic.     Mouth/Throat:     Mouth: Mucous membranes are moist.  Eyes:     Extraocular Movements: Extraocular movements intact.  Cardiovascular:     Rate and Rhythm: Normal rate and regular rhythm.   Pulmonary:     Effort: Pulmonary effort is normal. No respiratory distress.     Breath sounds: Normal breath sounds. No wheezing.  Abdominal:     General: Bowel sounds are normal. There is no distension.     Palpations: Abdomen is soft.     Tenderness: There is no abdominal tenderness.  Genitourinary:    Comments: Light pink-tinged urine noted in Foley bag Musculoskeletal:        General: Normal range of motion.     Cervical back: Normal range of motion and neck supple.  Skin:    General: Skin is warm and dry.  Neurological:     General: No focal deficit present.     Mental Status: He is alert.  Psychiatric:        Mood and Affect: Mood normal.        Behavior: Behavior normal.      Consultants:  Urology Cardiology   Procedures:    Data Reviewed: Results  for orders placed or performed during the hospital encounter of 12/07/24 (from the past 24 hours)  Basic metabolic panel with GFR     Status: Abnormal   Collection Time: 12/09/24  5:45 AM  Result Value Ref Range   Sodium 140 135 - 145 mmol/L   Potassium 3.1 (L) 3.5 - 5.1 mmol/L   Chloride 105 98 - 111 mmol/L   CO2 26 22 - 32 mmol/L   Glucose, Bld 107 (H) 70 - 99 mg/dL   BUN 16 8 - 23 mg/dL   Creatinine, Ser 9.23 0.61 - 1.24 mg/dL   Calcium  8.9 8.9 - 10.3 mg/dL   GFR, Estimated >39 >39 mL/min   Anion gap 9 5 - 15    I have reviewed pertinent nursing notes, vitals, labs, and images as necessary. I have ordered labwork to follow up on as indicated.  I have reviewed the last notes from staff over past 24 hours. I have discussed patient's care plan and test results with nursing staff, CM/SW, and other staff as appropriate.  Old records reviewed in assessment of this patient  Time spent: Greater than 50% of the 55 minute visit was spent in counseling/coordination of care for the patient as laid out in the A&P.   LOS: 1 day   Alm Apo, MD [facility] 12/09/2024, 3:34 PM "

## 2024-12-09 NOTE — Anesthesia Preprocedure Evaluation (Addendum)
 "                                  Anesthesia Evaluation  Patient identified by MRN, date of birth, ID band Patient awake    Reviewed: Allergy & Precautions, NPO status , Patient's Chart, lab work & pertinent test results  History of Anesthesia Complications (+) history of anesthetic complications (patient reports feeling over anesthetized after his most recent procedure where he received gas but felt great the time before that when he had a TIVA)  Airway Mallampati: II  TM Distance: >3 FB Neck ROM: Full   Comment: Previous grade I view with Miller 3, easy mask with [employer]  (+) Dental Advisory Given   Pulmonary neg shortness of breath, sleep apnea and Continuous Positive Airway Pressure Ventilation , neg COPD, neg recent URI, former smoker   Pulmonary exam normal breath sounds clear to auscultation       Cardiovascular hypertension (HCTZ, losartan ), Pt. on medications (-) angina + DVT (LLE in 2023 after surgery, s/p Eliquis  x6 months)  (-) Past MI, (-) Cardiac Stents and (-) CABG + dysrhythmias (PVC, PACs) Atrial Fibrillation + Valvular Problems/Murmurs  Rhythm:Regular Rate:Normal  HLD, right iliac artery aneurysm  TTE 12/28/2022: IMPRESSIONS    1. Left ventricular ejection fraction, by estimation, is 60 to 65%. The  left ventricle has normal function. The left ventricle has no regional  wall motion abnormalities. Left ventricular diastolic parameters are  indeterminate. Elevated left ventricular  end-diastolic pressure.   2. Right ventricular systolic function is normal. The right ventricular  size is moderately enlarged.   3. The mitral valve is normal in structure. Trivial mitral valve  regurgitation. No evidence of mitral stenosis.   4. The aortic valve is tricuspid. There is moderate calcification of the  aortic valve. There is moderate thickening of the aortic valve. Aortic  valve regurgitation is not visualized. Aortic valve  sclerosis/calcification is  present, without any evidence  of aortic stenosis.   5. The inferior vena cava is normal in size with greater than 50%  respiratory variability, suggesting right atrial pressure of 3 mmHg.     Neuro/Psych neg Seizures TIA   GI/Hepatic Neg liver ROS,GERD  Medicated,,  Endo/Other  negative endocrine ROS    Renal/GU negative Renal ROS   Bladder cancer    Musculoskeletal   Abdominal   Peds  Hematology  (+) Blood dyscrasia, anemia Lab Results      Component                Value               Date                      WBC                      6.6                 12/08/2024                HGB                      11.2 (L)            12/08/2024                HCT  33.4 (L)            12/08/2024                MCV                      94.9                12/08/2024                PLT                      165                 12/08/2024              Anesthesia Other Findings 83 y.o. 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   Last Plavix : 12/05/2024  Reproductive/Obstetrics                              Anesthesia Physical Anesthesia Plan  ASA: 3  Anesthesia Plan: General   Post-op Pain Management: Tylenol  PO (pre-op)*   Induction: Intravenous  PONV Risk Score and Plan: 2 and Ondansetron , Dexamethasone , Treatment may vary due to age or medical condition, Propofol  infusion and TIVA  Airway Management Planned: LMA  Additional Equipment:   Intra-op Plan:   Post-operative Plan: Extubation in OR  Informed Consent: I have reviewed the patients History and Physical, chart, labs and discussed the procedure including the risks, benefits and alternatives for the proposed anesthesia with the patient or authorized representative who has indicated his/her understanding and  acceptance.     Dental advisory given  Plan Discussed with: CRNA and Anesthesiologist  Anesthesia Plan Comments: (Risks of general anesthesia discussed including, but not limited to, sore throat, hoarse voice, chipped/damaged teeth, injury to vocal cords, nausea and vomiting, allergic reactions, lung infection, heart attack, stroke, and death. All questions answered. )         Anesthesia Quick Evaluation  "

## 2024-12-09 NOTE — Plan of Care (Signed)

## 2024-12-09 NOTE — Progress Notes (Signed)
" °   12/09/24 2200  BiPAP/CPAP/SIPAP  BiPAP/CPAP/SIPAP Pt Type Adult  Reason BIPAP/CPAP not in use Other(comment) (refused)    "

## 2024-12-09 NOTE — Progress Notes (Signed)
" ° ° °  PROCEDURAL EXPEDITER PROGRESS NOTE  Patient Name: Jonathan Lane  DOB:[birth year] Date of Admission: 12/07/2024  Date of Assessment:12/09/2024   -------------------------------------------------------------------------------------------------------------------   Brief clinical summary: 83 yr old make with Hx of HTNGERD, OSA with CPAP, DVT and bladder cancer and aneurysm of irght common iliac artery.  He is having a TURBT on 12/10/2024  Orders in place:  Yes   Communication with surgical team if no orders: n/a   Labs, test, and orders reviewed: yes   Requires surgical clearance:   No  What type of clearance: n/a  Clearance received: n/a  Barriers noted:needs EKG    Intervention provided by [facility] team: EKG order placed   Barrier resolved:  no   -------------------------------------------------------------------------------------------------------------------  [employer], Ronal DELENA Bald Please contact us  directly via secure chat (search for [facility]) or by calling us  at [phone] [facility]).  "

## 2024-12-10 ENCOUNTER — Encounter (HOSPITAL_COMMUNITY): Payer: Self-pay | Admitting: Family Medicine

## 2024-12-10 ENCOUNTER — Inpatient Hospital Stay (HOSPITAL_COMMUNITY): Payer: Self-pay | Admitting: Anesthesiology

## 2024-12-10 ENCOUNTER — Encounter (HOSPITAL_COMMUNITY): Admission: EM | Disposition: A | Payer: Self-pay | Source: Home / Self Care | Attending: Internal Medicine

## 2024-12-10 ENCOUNTER — Inpatient Hospital Stay (HOSPITAL_COMMUNITY)

## 2024-12-10 ENCOUNTER — Inpatient Hospital Stay (HOSPITAL_COMMUNITY): Admission: RE | Admit: 2024-12-10 | Admitting: Urology

## 2024-12-10 DIAGNOSIS — Z87891 Personal history of nicotine dependence: Secondary | ICD-10-CM | POA: Diagnosis not present

## 2024-12-10 DIAGNOSIS — I10 Essential (primary) hypertension: Secondary | ICD-10-CM

## 2024-12-10 DIAGNOSIS — C679 Malignant neoplasm of bladder, unspecified: Secondary | ICD-10-CM

## 2024-12-10 DIAGNOSIS — T83091A Other mechanical complication of indwelling urethral catheter, initial encounter: Secondary | ICD-10-CM | POA: Diagnosis not present

## 2024-12-10 HISTORY — PX: CYSTOSCOPY/RETROGRADE/URETEROSCOPY: SHX5316

## 2024-12-10 HISTORY — PX: TRANSURETHRAL RESECTION OF BLADDER TUMOR: SHX2575

## 2024-12-10 SURGERY — TURBT (TRANSURETHRAL RESECTION OF BLADDER TUMOR)
Anesthesia: General | Site: Ureter | Laterality: Right

## 2024-12-10 MED ORDER — CEFAZOLIN SODIUM-DEXTROSE 2-4 GM/100ML-% IV SOLN: 2.0000 g | Freq: Once | INTRAVENOUS | Status: DC

## 2024-12-10 MED ORDER — OXYCODONE HCL 5 MG PO TABS: 5.0000 mg | ORAL_TABLET | ORAL | Status: DC | PRN

## 2024-12-10 MED ORDER — CEFAZOLIN SODIUM-DEXTROSE 2-4 GM/100ML-% IV SOLN
INTRAVENOUS | Status: AC
  Filled 2024-12-10: qty 100

## 2024-12-10 MED ORDER — MIDAZOLAM HCL 2 MG/2ML IJ SOLN
INTRAMUSCULAR | Status: AC
  Filled 2024-12-10: qty 2

## 2024-12-10 MED ORDER — SODIUM CHLORIDE 0.9 % IR SOLN
Status: DC | PRN
  Administered 2024-12-10: 6000 mL

## 2024-12-10 MED ORDER — ONDANSETRON HCL 4 MG/2ML IJ SOLN
INTRAMUSCULAR | Status: DC | PRN
  Administered 2024-12-10: 4 mg via INTRAVENOUS

## 2024-12-10 MED ORDER — LIDOCAINE 2% (20 MG/ML) 5 ML SYRINGE
INTRAMUSCULAR | Status: DC | PRN
  Administered 2024-12-10: 100 mg via INTRAVENOUS

## 2024-12-10 MED ORDER — OXYCODONE HCL 5 MG PO TABS
5.0000 mg | ORAL_TABLET | Freq: Once | ORAL | Status: AC | PRN
  Administered 2024-12-10: 5 mg via ORAL

## 2024-12-10 MED ORDER — PROPOFOL 10 MG/ML IV BOLUS
INTRAVENOUS | Status: DC | PRN
  Administered 2024-12-10: 30 mg via INTRAVENOUS
  Administered 2024-12-10: 120 mg via INTRAVENOUS

## 2024-12-10 MED ORDER — FENTANYL CITRATE (PF) 50 MCG/ML IJ SOSY
25.0000 ug | PREFILLED_SYRINGE | INTRAMUSCULAR | Status: DC | PRN
  Administered 2024-12-10 (×2): 50 ug via INTRAVENOUS

## 2024-12-10 MED ORDER — MIDAZOLAM HCL 5 MG/5ML IJ SOLN
INTRAMUSCULAR | Status: DC | PRN
  Administered 2024-12-10: 1 mg via INTRAVENOUS

## 2024-12-10 MED ORDER — PROPOFOL 500 MG/50ML IV EMUL
INTRAVENOUS | Status: DC | PRN
  Administered 2024-12-10: 125 ug/kg/min via INTRAVENOUS

## 2024-12-10 MED ORDER — AMISULPRIDE (ANTIEMETIC) 5 MG/2ML IV SOLN: 10.0000 mg | Freq: Once | INTRAVENOUS | Status: DC | PRN

## 2024-12-10 MED ORDER — OXYCODONE HCL 5 MG/5ML PO SOLN: 5.0000 mg | Freq: Once | ORAL | Status: AC | PRN

## 2024-12-10 MED ORDER — ORAL CARE MOUTH RINSE: 15.0000 mL | Freq: Once | OROMUCOSAL | Status: AC

## 2024-12-10 MED ORDER — ACETAMINOPHEN 500 MG PO TABS
1000.0000 mg | ORAL_TABLET | Freq: Once | ORAL | Status: AC
  Administered 2024-12-10: 1000 mg via ORAL
  Filled 2024-12-10: qty 2

## 2024-12-10 MED ORDER — TAMSULOSIN HCL 0.4 MG PO CAPS
0.8000 mg | ORAL_CAPSULE | Freq: Every day | ORAL | Status: DC
  Administered 2024-12-10 – 2024-12-11 (×2): 0.8 mg via ORAL
  Filled 2024-12-10 (×2): qty 2

## 2024-12-10 MED ORDER — DEXAMETHASONE SOD PHOSPHATE PF 10 MG/ML IJ SOLN
INTRAMUSCULAR | Status: DC | PRN
  Administered 2024-12-10: 4 mg via INTRAVENOUS

## 2024-12-10 MED ORDER — LACTATED RINGERS IV SOLN: INTRAVENOUS | Status: DC

## 2024-12-10 MED ORDER — OXYCODONE HCL 5 MG PO TABS
ORAL_TABLET | ORAL | Status: AC
  Filled 2024-12-10: qty 1

## 2024-12-10 MED ORDER — CHLORHEXIDINE GLUCONATE 0.12 % MT SOLN
15.0000 mL | Freq: Once | OROMUCOSAL | Status: AC
  Administered 2024-12-10: 15 mL via OROMUCOSAL

## 2024-12-10 MED ORDER — FENTANYL CITRATE (PF) 50 MCG/ML IJ SOSY
PREFILLED_SYRINGE | INTRAMUSCULAR | Status: AC
  Filled 2024-12-10: qty 1

## 2024-12-10 MED ORDER — FENTANYL CITRATE (PF) 100 MCG/2ML IJ SOLN
INTRAMUSCULAR | Status: AC
  Filled 2024-12-10: qty 2

## 2024-12-10 MED ORDER — FENTANYL CITRATE (PF) 250 MCG/5ML IJ SOLN
INTRAMUSCULAR | Status: DC | PRN
  Administered 2024-12-10: 50 ug via INTRAVENOUS
  Administered 2024-12-10: 25 ug via INTRAVENOUS

## 2024-12-10 MED ORDER — PROPOFOL 10 MG/ML IV BOLUS
INTRAVENOUS | Status: AC
  Filled 2024-12-10: qty 20

## 2024-12-10 MED ORDER — IOHEXOL 300 MG/ML  SOLN
INTRAMUSCULAR | Status: DC | PRN
  Administered 2024-12-10: 44 mL

## 2024-12-10 MED ORDER — MORPHINE SULFATE (PF) 2 MG/ML IV SOLN
2.0000 mg | INTRAVENOUS | Status: DC | PRN
  Administered 2024-12-10 – 2024-12-11 (×2): 2 mg via INTRAVENOUS
  Filled 2024-12-10 (×2): qty 1

## 2024-12-10 SURGICAL SUPPLY — 23 items
BAG URINE DRAIN 2000ML AR STRL (UROLOGICAL SUPPLIES) IMPLANT
BAG URO CATCHER STRL LF (MISCELLANEOUS) ×2 IMPLANT
BASKET LASER NITINOL 1.9FR (BASKET) IMPLANT
CATH FOLEY 2WAY SLVR 5CC 18FR (CATHETERS) IMPLANT
CATH URETL OPEN END 6FR 70 (CATHETERS) ×2 IMPLANT
CLOTH BEACON ORANGE TIMEOUT ST (SAFETY) ×2 IMPLANT
DRAPE FOOT SWITCH (DRAPES) ×2 IMPLANT
ELECT REM PT RETURN 15FT ADLT (MISCELLANEOUS) ×2 IMPLANT
GLOVE SURG LX STRL 7.5 STRW (GLOVE) ×2 IMPLANT
GOWN STRL REUS W/ TWL XL LVL3 (GOWN DISPOSABLE) ×2 IMPLANT
GUIDEWIRE ANG ZIPWIRE 038X150 (WIRE) ×2 IMPLANT
GUIDEWIRE STR DUAL SENSOR (WIRE) ×2 IMPLANT
KIT TURNOVER KIT A (KITS) ×2 IMPLANT
LOOP CUT BIPOLAR 24F LRG (ELECTROSURGICAL) IMPLANT
MANIFOLD NEPTUNE II (INSTRUMENTS) ×2 IMPLANT
PACK CYSTO (CUSTOM PROCEDURE TRAY) ×2 IMPLANT
SHEATH NAVIGATOR HD 11/13X28 (SHEATH) IMPLANT
SHEATH NAVIGATOR HD 11/13X36 (SHEATH) IMPLANT
SYRINGE TOOMEY IRRIG 70ML (MISCELLANEOUS) IMPLANT
TRACTIP FLEXIVA PULS ID 200XHI (Laser) IMPLANT
TUBE PU 8FR 16IN ENFIT (TUBING) ×2 IMPLANT
TUBING CONNECTING 10 (TUBING) ×2 IMPLANT
TUBING UROLOGY SET (TUBING) ×2 IMPLANT

## 2024-12-10 NOTE — Progress Notes (Signed)
 " Progress Note    Jonathan Lane   FMW:[id number]  DOB: 10/13/1941  DOA: 12/07/2024     2 PCP: Chrystal Lamarr RAMAN, MD  Initial CC: Urinary retention and hematuria  Hospital Course: Jonathan Lane is a 83 y.o. 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   This is his 4th ED visit since 12/04/24.  He had a cystoscopy on 12/04/24. This was his routine 6 month surveillance cystoscopy.  He states they found 3 lesions Thursday and is scheduled for surgery in January.  He was sent home without a catheter. He was fine after this until about 8pm, then was unable to urinate. He came to ED evening of 12/04/24. He then had a foley placed at this time with irrigation and was sent home with foley. He states on Saturday he was unable to clear out the clots in the foley. He came back to ED and catheter was irrigated. He was sent home with irrigation supplies.  Sunday morning he said there were so many blood clots that the port wouldn't stay clear so he came back to ED. He had irrigation again and was trained on irrigation and sent home. He states he went home and even though he was irrigating the urge to urinate was so strong that urine was coming out of his penis. This happened 2-3 times and was bloody urine. He called on call [facility] and was told he was irrigating out of the wrong port. She advised him to come back to ED. No fevers /chills. No N/V/D. He has had decreased PO intake.    Last took plavix  12/20.   He was started on CBI on admission and urology was consulted.  Interval History:  No issues overnight.   Urine remains light pink. Doing well otherwise.  Undergoing surgery today still. Seen in the afternoon prior to surgery.   Assessment and Plan: * Complication, blocked Foley catheter, initial encounter - hx  bladder and left ureteral cancer s/p urinary retention and foley placement on 12/18 after routine cystoscopy found to have blocked foley catheter -urology consulted -continue CBI given ongoing hematuria and abdominal discomfort when paused  -last took plavix  12/20; continue holding - cardiology clearance completed; no issues for proceeding with procedure  - s/p cystoscopy and TURBT on 12/10/2024  Aneurysm of right common iliac artery - follows with [facility]; last seen Sept 2023 - per last note, no intervention would be considered unless aneurysm reached 3.5 cm and last measurement was 1.5 cm; he was recommended for repeat imaging in about 2 years from last visit -He has undergone repeat imaging on 10/17/2024.  Right CIA mid and distal measuring 1.5 and 1.6 cm respectively; largest transverse diameter 1.6 cm distal right CIA -He was evaluated by vascular surgery also on 10/17/2024.  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  MRI negative at that time - He was recommended for DAPT then monotherapy plavix  thereafter and has remained compliant - last dose plavix  12/20; now on hold, see above   History of DVT (deep vein thrombosis)-resolved as of 12/09/2024 History of DVT, believed to be provoked Completed course of eliquis .   Bladder  cancer [facility]) Hx of bladder cancer and left ureteral cancer followed by urology  No chemo or radiation  Routine cystoscopy surveillance on 12/18 with 3 abnormal lesions - Further management as per urology   Hypokalemia - Replete and trend   Gastroesophageal reflux disease Continue PPI   OSA on CPAP Continue cpap at night   Essential hypertension Elevated, but has not had his home medication  Continue diltiazem  240mg  and losartan -hydrochlorothiazide  100-25mg  Hydralazine PRN     Antimicrobials: N/a  DVT prophylaxis:  SCDs Start: 12/07/24 2223   Code Status:   Code Status: Full Code  Mobility Assessment (Last 72 Hours)     Mobility Assessment     Row Name 12/10/24 1014 12/09/24 2154 12/09/24 1027 12/08/24 1955 12/08/24 0925   Does the patient have exclusion criteria? No- Perform mobility assessment No- Perform mobility assessment No- Perform mobility assessment No- Perform mobility assessment No- Perform mobility assessment   What is the highest level of mobility based on the mobility assessment? Level 5 (Ambulates independently) - Balance while walking independently - Complete Level 5 (Ambulates independently) - Balance while walking independently - Complete Level 5 (Ambulates independently) - Balance while walking independently - Complete Level 5 (Ambulates independently) - Balance while walking independently - Complete Level 5 (Ambulates independently) - Balance while walking independently - Complete    Row Name 12/08/24 0002 12/08/24 0001         Does the patient have exclusion criteria? No- Perform mobility assessment No- Perform mobility assessment      What is the highest level of mobility based on the mobility assessment? Level 5 (Ambulates independently) - Balance while walking independently - Complete Level 4 (Ambulates with assistance) - Balance while stepping forward/back - Complete         Diet: Diet Orders (From admission, onward)     Start     Ordered   12/10/24 1637  Diet Carb Modified  Diet effective now       Question Answer Comment  Calorie Level Medium 1600-2000   Fluid consistency: Thin      12/10/24 1636            Barriers to discharge: none Disposition Plan:  Home  HH orders placed: n/a Status is: Inpt   Objective: Blood pressure (!) 154/73, pulse (!) 57, temperature 98.2 F (36.8 C), temperature source Oral, resp. rate 18, height 6' 1 (1.854 m), weight 91.6 kg, SpO2 100%.  Examination:  Physical  Exam Constitutional:      General: He is not in acute distress.    Appearance: Normal appearance.  HENT:     Head: Normocephalic and atraumatic.     Mouth/Throat:     Mouth: Mucous membranes are moist.  Eyes:     Extraocular Movements: Extraocular movements intact.  Cardiovascular:     Rate and Rhythm: Normal rate and regular rhythm.  Pulmonary:     Effort: Pulmonary effort is normal. No respiratory distress.     Breath sounds: Normal breath sounds. No wheezing.  Abdominal:     General: Bowel sounds are normal. There is no distension.     Palpations: Abdomen is soft.     Tenderness: There is no abdominal tenderness.  Genitourinary:    Comments: Light pink-tinged urine noted in Foley bag Musculoskeletal:        General: Normal range of motion.     Cervical back: Normal range of motion and neck supple.  Skin:    General: Skin is warm and dry.  Neurological:     General: No focal deficit present.     Mental Status: He is alert.  Psychiatric:        Mood and Affect: Mood normal.        Behavior: Behavior normal.      Consultants:  Urology Cardiology   Procedures:    Data Reviewed: No results found for this or any previous visit (from the past 24 hours).   I have reviewed pertinent nursing notes, vitals, labs, and images as necessary. I have ordered labwork to follow up on as indicated.  I have reviewed the last notes from staff over past 24 hours. I have discussed patient's care plan and test results with nursing staff, CM/SW, and other staff as appropriate.  Old records reviewed in assessment of this patient  Time spent: Greater than 50% of the 55 minute visit was spent in counseling/coordination of care for the patient as laid out in the A&P.   LOS: 2 days   Alm Apo, MD [facility] 12/10/2024, 5:44 PM "

## 2024-12-10 NOTE — Op Note (Unsigned)
 NAME: Jonathan Lane, TUMMINELLO MEDICAL RECORD NO: [id number] ACCOUNT NO: [id number] DATE OF BIRTH: 07-28-1941 FACILITY: [facility] LOCATION: [id number] PHYSICIAN: Ricardo Likens, MD  Operative Report   SURGEON:  Ricardo Likens, MD.  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  SPECIMENS: 1.  Bladder tumor. 2.  Base of bladder tumor.  FINDINGS: 1.  Recurrent papillary bladder tumor approximately 6 cm2, mostly in the right lateral and bladder neck areas. 2.  Unremarkable retrograde pyelogram. 3.  Multifocal papillary tumors involving nearly the entire length of the left ureter as well as left lower pole of the kidney.  PREOPERATIVE INDICATION:  The patient is a very pleasant and quite vigorous 83 year old man with longstanding history of multifocal urothelial carcinoma.  He is status post left distal ureterectomy and reimplantation years ago.  He has been very  compliant with surveillance of this.  He has been found on followup to have some new foci of tumor in his left renal pelvis which he has elected local therapy for with p.r.n. laser ablation.  He subsequently developed lower tract disease with recurrent  high-grade bladder cancer.  He was noted to have recurrence on recent office cystoscopy and is scheduled for elective transurethral resection next month.  Unfortunately, he presented to the hospital with refractory hematuria with clot retention several  days ago, likely due to bleeding from his known bladder cancer.  Options were discussed including recommended path of proceeding with the transurethral resection during this hospitalization.  He wished to proceed.  He has hold his Plavix  for  approximately 5 days.  Informed consent was obtained and placed in the medical record.  PROCEDURE IN DETAIL:  The patient being  identified and verified and the procedure being cysto with bilateral retrogrades, transurethral resection of bladder tumor and left ureteroscopy was confirmed.  Procedure timeout was performed.  Intravenous  antibiotics were administered.  General LMA anesthesia was induced.  The patient was placed into a low lithotomy position.  Sterile field was created, prepping and draping the patient's penis, perineum, and proximal thigh using iodine.  Cystourethroscopy  was performed using a 21-French rigid cystoscope with offset lens.  Inspection of the anterior and posterior urethra was unremarkable.  Inspection of the urinary bladder revealed minimal formed clot in the bladder but multifocal papillary tumor mostly  involving the right lateral area bladder neck and some foci on the left bladder neck, total surface area at least 6 cm2.  His right ureteral orifice was orthotopic in position.  His left ureteral orifice was reimplanted at the dome as I expected.   Cystoscope was then exchanged for the 26-French resectoscope sheath with a visual obturator and transurethral resection was performed of each foci of tumor down to the superficial fibromuscular stroma of the urinary bladder.  This generated multiple  bladder tumor fragments which were irrigated, set aside and labeled as such.  The dominant focus of tumor on the right lateral was then sampled at the deep aspect using cold cup forceps and a separate base of bladder tumor area was set aside for  permanent pathology.  Each site was then fulgurated again.  No evidence of bladder perforation.  Hemostasis following this was quite good, and we achieved the goals of stopping the bleeding and resecting the tumor.  Given his operating room stay and  known multifocality of his tumor, I felt that retrograde  pyelograms and left ureteroscopy was warranted.  As such, the right retrograde pyelogram was obtained.  Right retrograde pyelogram demonstrated a single right ureter  and a single system in the right kidney.  No filling defects or narrowing noted *** via a 6-French open-ended catheter.  Next, left retrograde pyelogram was obtained.  Left retrograde pyelogram demonstrated single left ureter, single system left kidney.  There was questionable multifocal filling defects in the area of the ureter.  The renal pelvis was relatively unremarkable.  A sensor wire was advanced to the level of  *** pole.  Alongside a semirigid ureteroscopy was performed of the distal *** ureter with the semirigid ureteroscope.  There was multifocal ____ papillary tumor noted in the distal ureter and mid ureter with this.  Each focus approximately 1-2 cm and  not obstructing.  Semirigid ureteroscope was then exchanged for a medium length ureteral access sheath.  Flexible digital ureteroscopy was performed in the proximal ureter and systematic inspection of the left kidney.  Inspection of the kidney did reveal  multifocal papillary tumors in the lower pole, estimated surface approximately 2-2.5 cm.  Again this was nonobstructive and withdrawing it revealed additional foci of papillary tumor in the ureter, again approximately 1-2 cm focus every 5-6 cm of its  entire length.  Again, this was nonobstructing and not actively bleeding.  Given the patient's near-term goals which are to avoid bleeding as he has an engagement in early February that is quite important to him locally, and possibly considering further  curative therapy following this, I elected not to place stents or perform any laser ablation of this foci of tumor as I felt this would actually promote further bleeding and decrease the chances of him being able to proceed with his engagement in early  February.  As such, the access sheath was removed under continuous vision.  No significant mucosal abnormalities were found.  A new 18-French Foley catheter was placed per urethra to straight drain, 10 mL of sterile water  in the balloon.  This was   irrigated quantitatively and procedure terminated.  The patient tolerated the procedure well, no immediate periprocedural complications.  The patient was taken to postanesthesia care unit in stable condition with plan for continued inpatient admission.   I will recommend removal of the catheter in the morning with a trial of void, possible discharge as soon as tomorrow afternoon pending his medical status.   NIK D: 12/10/2024 4:32:50 pm T: 12/10/2024 9:44:00 pm  JOB: [id number]/ [id number]

## 2024-12-10 NOTE — Progress Notes (Signed)
"   ° °  Subjective/Chief Complaint:  1 - BCG-Refractory Multifocal Urothelial Carcioma - 03/2022 full BCG induction then T2G3N0Mx left distal ureteral tumor with negative margins at robotic left distal ureterectomy and V-Y plasty / psoas hitch ureteral reimplant 07/2022  Recent Surveillance: 3/20224 - CT, CMP, Cysto - no recurrence, Cr 0.7; 10/2023 - OR cysto, bilateral uretreroscopy ==> left lower pole 1cm papilary tumor treated with laser ablation 05/2024 - OR TURBT, Left ureteroscopy ==> T1G3 bladder tumor + left lower pole scant tumor laser ablation + stent placement 11/2024 - cysto - multifocal bladder recurrence papilary tumor  Today Charlena is seen to proceed with TURBT, retrogrades, left ureteroscopy for his multifocal recurrent urothelial cancer that is causing large bleeding. Has held Plavix  x 5 days. Hgb 11.2.  Objective: Vital signs in last 24 hours: Temp:  [98.2 F (36.8 C)-98.3 F (36.8 C)] 98.2 F (36.8 C) (12/24 0427) Pulse Rate:  [57-58] 57 (12/24 0427) Resp:  [17-18] 18 (12/24 0427) BP: (124-149)/(64-70) 149/70 (12/24 0427) Last BM Date : 12/06/24  Intake/Output from previous day: 12/23 0701 - 12/24 0700 In: 30  Out: 7525 [Urine:7525] Intake/Output this shift: Total I/O In: -  Out: 2000 [Urine:2000]  NAD, very mentally spry, at baseline NLB-RA SNTND, prior scars w/o hernias 3 way foley in place with light pink urine on slow gtt NO c/c/e  Lab Results:  Recent Labs    12/07/24 2111 12/08/24 0453  WBC 8.2 6.6  HGB 13.0 11.2*  HCT 38.8* 33.4*  PLT 184 165   BMET Recent Labs    12/08/24 0453 12/09/24 0545  NA 139 140  K 3.2* 3.1*  CL 103 105  CO2 28 26  GLUCOSE 127* 107*  BUN 20 16  CREATININE 0.81 0.76  CALCIUM  8.8* 8.9   PT/INR No results for input(s): LABPROT, INR in the last 72 hours. ABG No results for input(s): PHART, HCO3 in the last 72 hours.  Invalid input(s): PCO2, PO2  Studies/Results: No results  found.  Anti-infectives: Anti-infectives (From admission, onward)    None       Assessment/Plan:  Proceed as planned with TURBT / retrogrades, left uretreroscopy for recurrent bladder cancer and to r/o recurrence left renal pelvis tumor. Risks, benefits, alternatives, expected peri-op course discussed previously and reiteratd today.    Ricardo KATHEE Alvaro Mickey. 12/10/2024  "

## 2024-12-10 NOTE — Brief Op Note (Signed)
 12/10/2024  4:25 PM  PATIENT:  Jonathan Lane  83 y.o. male  PRE-OPERATIVE DIAGNOSIS:  BLADDER CA, UPPER TRACT KIDNEY CA  POST-OPERATIVE DIAGNOSIS:  BLADDER CA, UPPER TRACT KIDNEY CA  PROCEDURE:  Procedures with comments: TURBT (TRANSURETHRAL RESECTION OF BLADDER TUMOR) (N/A) CYSTOSCOPY/RETROGRADE/URETEROSCOPY (Right) - LEFT DIAGNOSTIC URETEROSCOPY  SURGEON:  Surgeons and Role:    * Manny, Ricardo KATHEE Raddle., MD - Primary  PHYSICIAN ASSISTANT:   ASSISTANTS: none   ANESTHESIA:   general  EBL:  minimal   BLOOD ADMINISTERED:none  DRAINS: 78F foley to gravity   LOCAL MEDICATIONS USED:  NONE  SPECIMEN:  Source of Specimen:  1- bladder tumor; 2 - base of bladder tumor  DISPOSITION OF SPECIMEN:  PATHOLOGY  COUNTS:  YES  TOURNIQUET:  * No tourniquets in log *  DICTATION: .Other Dictation: Dictation Number [id number]  PLAN OF CARE: Admit to inpatient   PATIENT DISPOSITION:  PACU - hemodynamically stable.   Delay start of Pharmacological VTE agent (>24hrs) due to surgical blood loss or risk of bleeding: yes

## 2024-12-10 NOTE — Anesthesia Postprocedure Evaluation (Signed)
"   Anesthesia Post Note  Patient: Jonathan Lane  Procedure(s) Performed: TURBT (TRANSURETHRAL RESECTION OF BLADDER TUMOR) (Bladder) CYSTOSCOPY/RETROGRADE/URETEROSCOPY (Right: Ureter)     Patient location during evaluation: PACU Anesthesia Type: General Level of consciousness: awake Pain management: pain level controlled Vital Signs Assessment: post-procedure vital signs reviewed and stable Respiratory status: spontaneous breathing, nonlabored ventilation and respiratory function stable Cardiovascular status: blood pressure returned to baseline and stable Postop Assessment: no apparent nausea or vomiting Anesthetic complications: no   No notable events documented.  Last Vitals:  Vitals:   12/10/24 1631 12/10/24 1645  BP: 135/63 139/77  Pulse: 67 62  Resp: 20 15  Temp: (!) 36.3 C   SpO2: 93% 97%    Last Pain:  Vitals:   12/10/24 1655  TempSrc:   PainSc: 4                  [name]      "

## 2024-12-10 NOTE — Plan of Care (Signed)

## 2024-12-10 NOTE — Anesthesia Procedure Notes (Signed)
 Procedure Name: LMA Insertion Date/Time: 12/10/2024 3:34 PM  Performed by: Cena Epps, CRNAPre-anesthesia Checklist: Patient identified, Emergency Drugs available, Suction available and Patient being monitored Patient Re-evaluated:Patient Re-evaluated prior to induction Oxygen Delivery Method: Circle System Utilized Preoxygenation: Pre-oxygenation with 100% oxygen Induction Type: IV induction Ventilation: Mask ventilation without difficulty LMA: LMA inserted LMA Size: 5.0 Number of attempts: 1 Airway Equipment and Method: Bite block Placement Confirmation: positive ETCO2 Tube secured with: Tape Dental Injury: Teeth and Oropharynx as per pre-operative assessment

## 2024-12-10 NOTE — Transfer of Care (Signed)
 Immediate Anesthesia Transfer of Care Note  Patient: Jonathan Lane  Procedure(s) Performed: TURBT (TRANSURETHRAL RESECTION OF BLADDER TUMOR) (Bladder) CYSTOSCOPY/RETROGRADE/URETEROSCOPY (Right: Ureter)  Patient Location: PACU  Anesthesia Type:General  Level of Consciousness: drowsy and patient cooperative  Airway & Oxygen Therapy: Patient Spontanous Breathing and Patient connected to nasal cannula oxygen  Post-op Assessment: Report given to RN and Post -op Vital signs reviewed and stable  Post vital signs: Reviewed and stable  Last Vitals:  Vitals Value Taken Time  BP 135/63 12/10/24 16:31  Temp    Pulse 65 12/10/24 16:35  Resp 18 12/10/24 16:36  SpO2 92 % 12/10/24 16:35  Vitals shown include unfiled device data.  Last Pain:  Vitals:   12/10/24 1446  TempSrc:   PainSc: 0-No pain         Complications: No notable events documented.

## 2024-12-10 NOTE — Consult Note (Signed)
 Urology Consult   Physician requesting consult: Dr. Patsy  Reason for consult: hematuria   History of Present Illness: Jonathan Lane is a 83 y.o. 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 Alvaro last week and was discharged with foley catheter in place d/t history of post-operative urinary retention. He has had ongoing issued with hematuria and foley clotting off for which he has presented to the ED 4 times. The first three times, the patient's catheter was irrigated to clear and he was discharged with flushing supplies (Urology was not consulted). Last night, pt presented again after having worsening bladder spasms at home and poorly draining catheter.   On arrival to the ED, patient was AFVSS. Cr 0.81, which is baseline. Hgb 13.0 on arrival and 11.2 this morning. His catheter was irrigated in the ED via the drainage port last night and follow-up POC ultrasound showed minimal residual clot in the bladder. He was started on CBI and admitted. CBI was clamped by me at 0430 this morning--urine was clear light pink. It has remained so for several hours.  Interval 12/24: Pt doing well. Has continued on CBI in anticipation of OR today for TURBT/ureteroscopy. No labs this am. Patient does report intermittent bladder spasms; would like to avoid them but is wondering if he should have some at home if he is discharged with catheter.  Past Medical History:  Diagnosis Date   Atrial fibrillation (HCC)    Blood clot in vein    2023 after surgery, dvt left leg, eliquis  x 6 months   Bradycardia    Cancer (HCC)    bladder   Cardiac murmur    GERD (gastroesophageal reflux disease)    Glaucoma    Heart palpitations    Hyperlipidemia    Hypertension    Iliac artery aneurysm    Right   Macular degeneration    Numbness 08/26/2019   LEFT FACE   Pneumonia    yrs ago   PVC  (premature ventricular contraction)    Skin abnormalities    pre cancerous lesion R hand   Skin cancer    Sleep apnea    uses cpap   Snoring     Past Surgical History:  Procedure Laterality Date   APPENDECTOMY  12/19/1963   BIOPSY  10/10/2023   Procedure: BIOPSY;  Surgeon: Wilhelmenia Aloha Raddle., MD;  Location: [facility];  Service: Gastroenterology;;   CHOLECYSTECTOMY N/A 10/11/2023   Procedure: LAPAROSCOPIC CHOLECYSTECTOMY;  Surgeon: Rubin Calamity, MD;  Location: [facility];  Service: General;  Laterality: N/A;   COLONOSCOPY     CYSTOSCOPY W/ RETROGRADES Bilateral 05/29/2024   Procedure: PHYLLIS, WITH RETROGRADE PYELOGRAM;  Surgeon: Alvaro Ricardo KATHEE Raddle., MD;  Location: [facility];  Service: Urology;  Laterality: Bilateral;   CYSTOSCOPY WITH BIOPSY N/A 05/16/2022   Procedure: CYSTOSCOPY WITH BIOPSY;  Surgeon: Watt Rush, MD;  Location: [facility];  Service: Urology;  Laterality: N/A;   CYSTOSCOPY WITH HOLMIUM LASER LITHOTRIPSY Left 11/02/2023   Procedure: HOLMIUM LASER ABLATION OF LEFT RENAL TUMOR;  Surgeon: Alvaro Ricardo KATHEE Raddle., MD;  Location: [facility];  Service: Urology;  Laterality: Left;   CYSTOSCOPY WITH RETROGRADE PYELOGRAM, URETEROSCOPY AND STENT PLACEMENT Bilateral 05/16/2022   Procedure: CYSTOSCOPY WITH BILATERAL RETROGRADE PYELOGRAM, LEFT URETEROSCOPY WITH BIOPSY, HOLMIUM LASER OF LEFT URETERAL STONE  AND LEFT STENT EXCHANGE, TUR LEFT URETERAL ORIFACE;  Surgeon: Watt Rush,  MD;  Location: [facility];  Service: Urology;  Laterality: Bilateral;  60 MINUTES   CYSTOSCOPY WITH RETROGRADE PYELOGRAM, URETEROSCOPY AND STENT PLACEMENT Left 07/26/2022   Procedure: CYSTOSCOPY WITH RETROGRADE PYELOGRAM, URETEROSCOPY AND STENT EXCHANGE;  Surgeon: Alvaro Hummer, MD;  Location: [facility];  Service: Urology;  Laterality: Left;   CYSTOSCOPY WITH RETROGRADE PYELOGRAM, URETEROSCOPY AND STENT PLACEMENT Bilateral 11/02/2023   Procedure: CYSTOSCOPY WITH BILATERAL RETROGRADE PYELOGRAM,  AND LEFT DIAGNOSTIC URETEROSCOPY;  Surgeon: Alvaro Hummer KATHEE Mickey., MD;  Location: [facility];  Service: Urology;  Laterality: Bilateral;   CYSTOSCOPY/URETEROSCOPY/HOLMIUM LASER/STENT PLACEMENT Left 05/29/2024   Procedure: CYSTOSCOPY/URETEROSCOPY/HOLMIUM LASER/STENT PLACEMENT;  Surgeon: Alvaro Hummer KATHEE Mickey., MD;  Location: [facility];  Service: Urology;  Laterality: Left;   ERCP N/A 10/10/2023   Procedure: ENDOSCOPIC RETROGRADE CHOLANGIOPANCREATOGRAPHY (ERCP);  Surgeon: Wilhelmenia Aloha Mickey., MD;  Location: [facility];  Service: Gastroenterology;  Laterality: N/A;   LYMPH NODE DISSECTION Bilateral 07/26/2022   Procedure: LYMPH NODE DISSECTION;  Surgeon: Alvaro Hummer, MD;  Location: [facility];  Service: Urology;  Laterality: Bilateral;   POLYPECTOMY     REMOVAL OF STONES  10/10/2023   Procedure: REMOVAL OF SLUDGE;  Surgeon: Wilhelmenia Aloha Mickey., MD;  Location: [facility];  Service: Gastroenterology;;   SKIN CANCER EXCISION  04/2023   mohs procedure right leg   SPHINCTEROTOMY  10/10/2023   Procedure: SPHINCTEROTOMY;  Surgeon: Mansouraty, Aloha Mickey., MD;  Location: [facility];  Service: Gastroenterology;;   TONSILLECTOMY     as a child x2   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   Procedure: RESTAGING TRANSURETHRAL RESECTION OF BLADDER TUMOR  (TURBT) LEFT URETEROSCOPY WITH LASER LEFT STENT EXCHANGE RIGHT STENT REMOVAL;  Surgeon: Watt Rush, MD;  Location: [facility];  Service: Urology;  Laterality: Bilateral;   TRANSURETHRAL RESECTION OF BLADDER TUMOR  05/29/2024   Procedure: TURBT (TRANSURETHRAL RESECTION OF BLADDER TUMOR);  Surgeon: Alvaro Hummer KATHEE Mickey., MD;  Location: [facility];  Service: Urology;;   TRANSURETHRAL RESECTION OF BLADDER TUMOR WITH MITOMYCIN -C Bilateral 01/03/2022   Procedure: CYSTOSCOPY TRANSURETHRAL RESECTION OF BLADDER TUMOR WITH POSSIBLE  BILATERAL RETROGRADES STENT PLACEMENT;  Surgeon: Watt Rush, MD;  Location: [facility];  Service:  Urology;  Laterality: Bilateral;   WISDOM TOOTH EXTRACTION      Current Hospital Medications:  Home Meds: Medications Ordered Prior to Encounter[1]   Scheduled Meds:  atorvastatin   20 mg Oral QHS   Chlorhexidine  Gluconate Cloth  6 each Topical Daily   diltiazem   240 mg Oral QHS   docusate sodium   200 mg Oral Daily   losartan   100 mg Oral QHS   And   hydrochlorothiazide   25 mg Oral QHS   melatonin  5 mg Oral QHS   pantoprazole   40 mg Oral Daily   polyethylene glycol  17 g Oral Daily   tadalafil   5 mg Oral Daily   tamsulosin   0.4 mg Oral Daily   Continuous Infusions:  sodium chloride  irrigation     PRN Meds:.acetaminophen  **OR** acetaminophen , lactulose , ondansetron  **OR** ondansetron  (ZOFRAN ) IV  Allergies: Allergies[2]  Family History  Problem Relation Age of Onset   Stroke Father    Stroke Mother    Stroke Sister    Breast cancer Sister    High blood pressure Sister    Diabetes Sister    Colon cancer Other        Uncle   Colon cancer Paternal Uncle    Mitral valve prolapse Other        sugery 09-27-2017  Rectal cancer Neg Hx    Stomach cancer Neg Hx     Social History:  reports that he quit smoking about 59 years ago. His smoking use included cigarettes. He has never been exposed to tobacco smoke. He has never used smokeless tobacco. He reports that he does not currently use alcohol. He reports that he does not use drugs.  ROS: A complete review of systems was performed.  All systems are negative except for pertinent findings as noted.  Physical Exam:  Vital signs in last 24 hours: Temp:  [98.2 F (36.8 C)-98.3 F (36.8 C)] 98.2 F (36.8 C) (12/24 0427) Pulse Rate:  [57-58] 57 (12/24 0427) Resp:  [17-18] 18 (12/24 0427) BP: (124-149)/(64-70) 149/70 (12/24 0427) Constitutional:  Alert and oriented, No acute distress Cardiovascular: Regular rate and rhythm, No JVD Respiratory: Normal respiratory effort, Lungs clear bilaterally GI: Abdomen is soft,  nontender, nondistended, no abdominal masses GU: 3-way foley in place draining clear thin pink urine with CBI clamped  Lymphatic: No lymphadenopathy Neurologic: Grossly intact, no focal deficits Psychiatric: Normal mood and affect  Laboratory Data:  Recent Labs    12/07/24 2111 12/08/24 0453  WBC 8.2 6.6  HGB 13.0 11.2*  HCT 38.8* 33.4*  PLT 184 165    Recent Labs    12/07/24 2111 12/08/24 0453 12/09/24 0545  NA 139 139 140  K 3.4* 3.2* 3.1*  CL 103 103 105  GLUCOSE 117* 127* 107*  BUN 22 20 16   CALCIUM  9.2 8.8* 8.9  CREATININE 0.82 0.81 0.76     No results found for this or any previous visit (from the past 24 hours).  No results found for this or any previous visit (from the past 240 hours).  Renal Function: Recent Labs    12/04/24 2056 12/07/24 2111 12/08/24 0453 12/09/24 0545  CREATININE 0.94 0.82 0.81 0.76   Estimated Creatinine Clearance: 77.7 mL/min (by C-G formula based on SCr of 0.76 mg/dL).  Radiologic Imaging: No results found.  I independently reviewed the above imaging studies.  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 He currently has TURBT and ureteroscopy scheduled for January, He is HDS and renal function and Hgb are reasonable, but he will likely continue to bleed and have issues with voiding until surgery. After discussing with Dr. Alvaro, plan  to proceed to OR today (12/24) for TURBT and ureteroscopy.  - NPO for OR - Continue holding Plavix  - Titrate CBI to maintain clear to light pink urine - Ok for Levsin  prn if patient having bladder spasms   Maurilio Agar 12/10/2024, 7:24 AM        [1]  No current facility-administered medications on file prior to encounter.   Current Outpatient Medications on File Prior to Encounter  Medication Sig Dispense Refill   acetaminophen  (TYLENOL ) 500 MG tablet Take 500 mg by mouth every 8 (eight) hours as needed for  moderate pain (pain score 4-6) or mild pain (pain score 1-3).     Apoaequorin (PREVAGEN EXTRA STRENGTH) 20 MG CAPS Take 1 capsule by mouth daily.     atorvastatin  (LIPITOR ) 20 MG tablet Take 1 tablet (20 mg total) by mouth daily. (Patient taking differently: Take 20 mg by mouth at bedtime.) 30 tablet 0   clopidogrel  (PLAVIX ) 75 MG tablet Take 75 mg by mouth daily.     Coenzyme Q-10 200 MG CAPS Take 200 mg by mouth daily.     COSOPT  PF 2-0.5 %  SOLN ophthalmic solution Place 1 drop into both eyes in the morning and at bedtime.     CRANBERRY PO Take 650 mg by mouth daily.     cyanocobalamin (VITAMIN B12) 1000 MCG tablet Take 1,000 mcg by mouth daily.     diltiazem  (DILT-XR) 240 MG 24 hr capsule Take 1 capsule (240 mg total) by mouth daily. (Patient taking differently: Take 240 mg by mouth at bedtime.) 90 capsule 2   docusate sodium  (COLACE) 100 MG capsule Take 1 capsule (100 mg total) by mouth 2 (two) times daily. (Patient taking differently: Take 200 mg by mouth daily.)     esomeprazole  (NEXIUM ) 20 MG capsule Take 20 mg by mouth 2 (two) times daily before a meal.     fluticasone  (FLONASE ) 50 MCG/ACT nasal spray Place 2 sprays into both nostrils daily as needed for allergies or rhinitis.     losartan -hydrochlorothiazide  (HYZAAR) 100-25 MG tablet Take 1 tablet by mouth daily. (Patient taking differently: Take 1 tablet by mouth at bedtime.) 90 tablet 2   Melatonin 3 MG CAPS Take 6 capsules by mouth at bedtime.     Misc Natural Products (PROSTATE HEALTH) CAPS Take 1 capsule by mouth daily.     Multiple Vitamins-Minerals (ADULT GUMMY PO) Take 2 capsules by mouth daily.     Multiple Vitamins-Minerals (PRESERVISION AREDS 2+MULTI VIT) CAPS Take 1 capsule by mouth 2 times daily at 12 noon and 4 pm.     nitroGLYCERIN  (NITROSTAT ) 0.4 MG SL tablet Place 1 tablet (0.4 mg total) under the tongue every 5 (five) minutes as needed for chest pain. 10 tablet 1   NON FORMULARY Pt uses a cpap nightly     Omega-3 Fatty  Acids (FISH OIL) 1200 MG CAPS Take 1,200 mg by mouth 2 (two) times daily.     OVER THE COUNTER MEDICATION Take 25 mg by mouth daily. Saffron 28 mg     RHOPRESSA  0.02 % SOLN Place 1 drop into both eyes at bedtime.      tadalafil  (CIALIS ) 5 MG tablet Take 5 mg by mouth daily.     tamsulosin  (FLOMAX ) 0.4 MG CAPS capsule Take 1 capsule (0.4 mg total) by mouth daily. (Patient taking differently: Take 0.4 mg by mouth in the morning and at bedtime.) 30 capsule 0   valACYclovir (VALTREX) 1000 MG tablet Take 1,000 mg by mouth 2 (two) times daily as needed (for outbreaks).     senna-docusate (SENOKOT-S) 8.6-50 MG tablet Take 1 tablet by mouth 2 (two) times daily. While taking prescription pain meds to prevent constipation (Patient not taking: Reported on 12/07/2024) 10 tablet 0   traMADol  (ULTRAM ) 50 MG tablet Take 1 tablet (50 mg total) by mouth every 6 (six) hours as needed for moderate pain (pain score 4-6) or severe pain (pain score 7-10) (post-operatively). (Patient not taking: Reported on 12/07/2024) 15 tablet 0  [2]  Allergies Allergen Reactions   Brimonidine Tartrate-Timolol  Itching, Rash and Other (See Comments)    Itching eyes Other reaction(s): Not available   Brimonidine Tartrate Other (See Comments)    Irritates the eyes and increased pressure   Tetracycline Hcl Other (See Comments)    Doesn't remember reaction-like 55 years ago    Ciprofloxacin Other (See Comments)    Constipation

## 2024-12-10 NOTE — Progress Notes (Signed)
" °   12/10/24 2355  BiPAP/CPAP/SIPAP  BiPAP/CPAP/SIPAP Pt Type Adult  Reason BIPAP/CPAP not in use Non-compliant    "

## 2024-12-10 NOTE — Plan of Care (Signed)
" °  Problem: Clinical Measurements: Goal: Ability to maintain clinical measurements within normal limits will improve Outcome: Progressing Goal: Will remain free from infection Outcome: Progressing   Problem: Activity: Goal: Risk for activity intolerance will decrease Outcome: Progressing   Problem: Nutrition: Goal: Adequate nutrition will be maintained Outcome: Progressing   Problem: Coping: Goal: Level of anxiety will decrease Outcome: Progressing   Problem: Elimination: Goal: Will not experience complications related to urinary retention Outcome: Progressing   Problem: Safety: Goal: Ability to remain free from injury will improve Outcome: Progressing   "

## 2024-12-11 ENCOUNTER — Encounter (HOSPITAL_COMMUNITY): Payer: Self-pay | Admitting: Urology

## 2024-12-11 DIAGNOSIS — T83091A Other mechanical complication of indwelling urethral catheter, initial encounter: Secondary | ICD-10-CM | POA: Diagnosis not present

## 2024-12-11 LAB — BASIC METABOLIC PANEL WITH GFR
Anion gap: 8 (ref 5–15)
BUN: 14 mg/dL (ref 8–23)
CO2: 29 mmol/L (ref 22–32)
Calcium: 9 mg/dL (ref 8.9–10.3)
Chloride: 102 mmol/L (ref 98–111)
Creatinine, Ser: 0.86 mg/dL (ref 0.61–1.24)
GFR, Estimated: >60 mL/min
Glucose, Bld: 99 mg/dL (ref 70–99)
Potassium: 3.8 mmol/L (ref 3.5–5.1)
Sodium: 139 mmol/L (ref 135–145)

## 2024-12-11 LAB — CBC WITH DIFFERENTIAL/PLATELET
Abs Immature Granulocytes: 0.02 K/uL (ref 0.00–0.07)
Basophils Absolute: 0 K/uL (ref 0.0–0.1)
Basophils Relative: 1 %
Eosinophils Absolute: 0.2 K/uL (ref 0.0–0.5)
Eosinophils Relative: 3 %
HCT: 36.3 % — ABNORMAL LOW (ref 39.0–52.0)
Hemoglobin: 11.9 g/dL — ABNORMAL LOW (ref 13.0–17.0)
Immature Granulocytes: 0 %
Lymphocytes Relative: 21 %
Lymphs Abs: 1.6 K/uL (ref 0.7–4.0)
MCH: 31.7 pg (ref 26.0–34.0)
MCHC: 32.8 g/dL (ref 30.0–36.0)
MCV: 96.8 fL (ref 80.0–100.0)
Monocytes Absolute: 0.9 K/uL (ref 0.1–1.0)
Monocytes Relative: 12 %
Neutro Abs: 4.7 K/uL (ref 1.7–7.7)
Neutrophils Relative %: 63 %
Platelets: 165 K/uL (ref 150–400)
RBC: 3.75 MIL/uL — ABNORMAL LOW (ref 4.22–5.81)
RDW: 13.7 % (ref 11.5–15.5)
WBC: 7.5 K/uL (ref 4.0–10.5)
nRBC: 0 % (ref 0.0–0.2)

## 2024-12-11 LAB — MAGNESIUM: Magnesium: 2 mg/dL (ref 1.7–2.4)

## 2024-12-11 NOTE — Progress Notes (Signed)
 1 Day Post-Op Subjective: Doing well, catheter out urineated small amout already slightly bloody.  Objective: Vital signs in last 24 hours: Temp:  [97.3 F (36.3 C)-98.5 F (36.9 C)] 98.5 F (36.9 C) (12/25 0459) Pulse Rate:  [55-68] 55 (12/25 0459) Resp:  [11-22] 18 (12/25 0459) BP: (117-158)/(58-85) 117/68 (12/25 0459) SpO2:  [93 %-100 %] 98 % (12/25 0459) Weight:  [91.6 kg] 91.6 kg (12/24 1446)  Intake/Output from previous day: 12/24 0701 - 12/25 0700 In: 950 [P.O.:150; I.V.:800] Out: 3675 [Urine:3675] Intake/Output this shift: Total I/O In: -  Out: 50 [Urine:50]  Physical Exam:  General: Alert and oriented CV: RRR Lungs: Clear Abdomen: Soft, ND, ATTP GU: no catheter  Lab Results: Recent Labs    12/11/24 0556  HGB 11.9*  HCT 36.3*   BMET Recent Labs    12/09/24 0545 12/11/24 0556  NA 140 139  K 3.1* 3.8  CL 105 102  CO2 26 29  GLUCOSE 107* 99  BUN 16 14  CREATININE 0.76 0.86  CALCIUM  8.9 9.0     Studies/Results: DG C-Arm 1-60 Min-No Report Result Date: 12/10/2024 Fluoroscopy was utilized by the requesting physician.  No radiographic interpretation.    Assessment/Plan: 1 - BCG-Refractory Multifocal Urothelial Carcioma - 03/2022 full BCG induction then [id number] left distal ureteral tumor with negative margins at robotic left distal ureterectomy and V-Y plasty / psoas hitch ureteral reimplant 07/2022   Recent Surveillance: 3/20224 - CT, CMP, Cysto - no recurrence, Cr 0.7; 10/2023 - OR cysto, bilateral uretreroscopy ==> left lower pole 1cm papilary tumor treated with laser ablation 05/2024 - OR TURBT, Left ureteroscopy ==> T1G3 bladder tumor + left lower pole scant tumor laser ablation + stent placement 11/2024 - cysto - multifocal bladder recurrence papilary tumor  Gross hematuria bladder tumor: - S?p TURBT on 12/10/24 - catheter rmeoved today  - please get PVR today and message urologist on call with result  - if PVR low patient can be  discharged - recommend holding plavix  for 5 days post op       LOS: 3 days   Jackey Pea MD 12/11/2024, 9:16 AM [facility]

## 2024-12-11 NOTE — Progress Notes (Signed)
 Pt urinating small volumes Will get PVR pt would like to stay in patient for an additional day will continue to monitor urination.

## 2024-12-11 NOTE — Progress Notes (Signed)
 " Progress Note    Jonathan Lane   FMW:[id number]  DOB: 1941-11-26  DOA: 12/07/2024     3 PCP: Chrystal Lamarr RAMAN, MD  Initial CC: Urinary retention and hematuria  Hospital Course: Jonathan Lane is a 83 y.o. 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   This is his 4th ED visit since 12/04/24.  He had a cystoscopy on 12/04/24. This was his routine 6 month surveillance cystoscopy.  He states they found 3 lesions Thursday and is scheduled for surgery in January.  He was sent home without a catheter. He was fine after this until about 8pm, then was unable to urinate. He came to ED evening of 12/04/24. He then had a foley placed at this time with irrigation and was sent home with foley. He states on Saturday he was unable to clear out the clots in the foley. He came back to ED and catheter was irrigated. He was sent home with irrigation supplies.  Sunday morning he said there were so many blood clots that the port wouldn't stay clear so he came back to ED. He had irrigation again and was trained on irrigation and sent home. He states he went home and even though he was irrigating the urge to urinate was so strong that urine was coming out of his penis. This happened 2-3 times and was bloody urine. He called on call [facility] and was told he was irrigating out of the wrong port. She advised him to come back to ED. No fevers /chills. No N/V/D. He has had decreased PO intake.    Last took plavix  12/20.   He was started on CBI on admission and urology was consulted.  Interval History:  No issues overnight.   Foley catheter removed this morning. We will monitor his ability to void throughout today and bladder scans as necessary.  Assessment and Plan: * Complication, blocked Foley catheter, initial encounter - hx bladder  and left ureteral cancer s/p urinary retention and foley placement on 12/18 after routine cystoscopy found to have blocked foley catheter -urology consulted -continue CBI given ongoing hematuria and abdominal discomfort when paused  -last took plavix  12/20; continue holding - cardiology clearance completed; no issues for proceeding with procedure  - s/p cystoscopy and TURBT on 12/10/2024 - foley removed 12/25; monitor for voiding ability and PVR as needed - Plavix  to remain on hold for at least 5 days postop  Aneurysm of right common iliac artery - follows with [facility]; last seen Sept 2023 - per last note, no intervention would be considered unless aneurysm reached 3.5 cm and last measurement was 1.5 cm; he was recommended for repeat imaging in about 2 years from last visit -He has undergone repeat imaging on 10/17/2024.  Right CIA mid and distal measuring 1.5 and 1.6 cm respectively; largest transverse diameter 1.6 cm distal right CIA -He was evaluated by vascular surgery also on 10/17/2024.  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  MRI negative at that time - He was recommended for DAPT then monotherapy plavix  thereafter and has remained compliant - last dose plavix  12/20; now on hold, see above  History of DVT (deep vein thrombosis)-resolved as of 12/09/2024 History of DVT, believed to be provoked Completed course of eliquis .   Bladder cancer (HCC) Hx of bladder cancer and left ureteral cancer followed by urology  No chemo or radiation  Routine cystoscopy surveillance on 12/18 with 3 abnormal lesions - Further management as per urology   Hypokalemia - Replete and trend   Gastroesophageal reflux disease Continue PPI   OSA on CPAP Continue cpap at night   Essential hypertension Elevated, but has not had  his home medication  Continue diltiazem  240mg  and losartan -hydrochlorothiazide  100-25mg  Hydralazine PRN    Antimicrobials: N/a  DVT prophylaxis:  SCDs Start: 12/07/24 2223   Code Status:   Code Status: Full Code  Mobility Assessment (Last 72 Hours)     Mobility Assessment     Row Name 12/11/24 0747 12/10/24 2115 12/10/24 1014 12/09/24 2154 12/09/24 1027   Does the patient have exclusion criteria? No- Perform mobility assessment No- Perform mobility assessment No- Perform mobility assessment No- Perform mobility assessment No- Perform mobility assessment   What is the highest level of mobility based on the mobility assessment? Level 5 (Ambulates independently) - Balance while walking independently - Complete Level 5 (Ambulates independently) - Balance while walking independently - Complete Level 5 (Ambulates independently) - Balance while walking independently - Complete Level 5 (Ambulates independently) - Balance while walking independently - Complete Level 5 (Ambulates independently) - Balance while walking independently - Complete    Row Name 12/08/24 1955           Does the patient have exclusion criteria? No- Perform mobility assessment       What is the highest level of mobility based on the mobility assessment? Level 5 (Ambulates independently) - Balance while walking independently - Complete          Diet: Diet Orders (From admission, onward)     Start     Ordered   12/10/24 1637  Diet Carb Modified  Diet effective now       Question Answer Comment  Calorie Level Medium 1600-2000   Fluid consistency: Thin      12/10/24 1636            Barriers to discharge: none Disposition Plan:  Home  HH orders placed: n/a Status is: Inpt   Objective: Blood pressure 134/63, pulse 63, temperature 98.2 F (36.8 C), resp. rate 19, height 6' 1 (1.854 m), weight 91.6 kg, SpO2 94%.  Examination:  Physical Exam Constitutional:      General: He is not in acute distress.     Appearance: Normal appearance.  HENT:     Head: Normocephalic and atraumatic.     Mouth/Throat:     Mouth: Mucous membranes are moist.  Eyes:     Extraocular Movements: Extraocular movements intact.  Cardiovascular:     Rate and Rhythm: Normal rate and regular rhythm.  Pulmonary:     Effort: Pulmonary effort is normal. No respiratory distress.     Breath sounds: Normal breath sounds. No wheezing.  Abdominal:     General: Bowel sounds are normal. There is no distension.     Palpations: Abdomen is soft.     Tenderness: There is no abdominal tenderness.  Genitourinary:    Comments: Light pink-tinged urine noted in Foley bag Musculoskeletal:        General: Normal range of motion.     Cervical back: Normal range of motion and neck supple.  Skin:    General: Skin  is warm and dry.  Neurological:     General: No focal deficit present.     Mental Status: He is alert.  Psychiatric:        Mood and Affect: Mood normal.        Behavior: Behavior normal.      Consultants:  Urology Cardiology   Procedures:  12/10/2024 PROCEDURE:  Procedures with comments: TURBT (TRANSURETHRAL RESECTION OF BLADDER TUMOR) (N/A) CYSTOSCOPY/RETROGRADE/URETEROSCOPY (Right) - LEFT DIAGNOSTIC URETEROSCOPY  Data Reviewed: Results for orders placed or performed during the hospital encounter of 12/07/24 (from the past 24 hours)  Basic metabolic panel with GFR     Status: None   Collection Time: 12/11/24  5:56 AM  Result Value Ref Range   Sodium 139 135 - 145 mmol/L   Potassium 3.8 3.5 - 5.1 mmol/L   Chloride 102 98 - 111 mmol/L   CO2 29 22 - 32 mmol/L   Glucose, Bld 99 70 - 99 mg/dL   BUN 14 8 - 23 mg/dL   Creatinine, Ser 9.13 0.61 - 1.24 mg/dL   Calcium  9.0 8.9 - 10.3 mg/dL   GFR, Estimated >39 >39 mL/min   Anion gap 8 5 - 15  CBC with Differential/Platelet     Status: Abnormal   Collection Time: 12/11/24  5:56 AM  Result Value Ref Range   WBC 7.5 4.0 - 10.5 K/uL   RBC 3.75 (L) 4.22 - 5.81  MIL/uL   Hemoglobin 11.9 (L) 13.0 - 17.0 g/dL   HCT 63.6 (L) 60.9 - 47.9 %   MCV 96.8 80.0 - 100.0 fL   MCH 31.7 26.0 - 34.0 pg   MCHC 32.8 30.0 - 36.0 g/dL   RDW 86.2 88.4 - 84.4 %   Platelets 165 150 - 400 K/uL   nRBC 0.0 0.0 - 0.2 %   Neutrophils Relative % 63 %   Neutro Abs 4.7 1.7 - 7.7 K/uL   Lymphocytes Relative 21 %   Lymphs Abs 1.6 0.7 - 4.0 K/uL   Monocytes Relative 12 %   Monocytes Absolute 0.9 0.1 - 1.0 K/uL   Eosinophils Relative 3 %   Eosinophils Absolute 0.2 0.0 - 0.5 K/uL   Basophils Relative 1 %   Basophils Absolute 0.0 0.0 - 0.1 K/uL   Immature Granulocytes 0 %   Abs Immature Granulocytes 0.02 0.00 - 0.07 K/uL  Magnesium      Status: None   Collection Time: 12/11/24  5:56 AM  Result Value Ref Range   Magnesium  2.0 1.7 - 2.4 mg/dL     I have reviewed pertinent nursing notes, vitals, labs, and images as necessary. I have ordered labwork to follow up on as indicated.  I have reviewed the last notes from staff over past 24 hours. I have discussed patient's care plan and test results with nursing staff, CM/SW, and other staff as appropriate.  Old records reviewed in assessment of this patient  Time spent: Greater than 50% of the 55 minute visit was spent in counseling/coordination of care for the patient as laid out in the A&P.   LOS: 3 days   Alm Apo, MD [facility] 12/11/2024, 3:32 PM "

## 2024-12-11 NOTE — Progress Notes (Signed)
" °   12/11/24 2236  BiPAP/CPAP/SIPAP  BiPAP/CPAP/SIPAP Pt Type Adult  Reason BIPAP/CPAP not in use Non-compliant (Patiient refused CPAP qhs.  He stated that this is his last night and he has done fine without it so far and would prefer to sleep without it again.)    "

## 2024-12-11 NOTE — Plan of Care (Signed)
  Problem: Education: Goal: Knowledge of General Education information will improve Description: Including pain rating scale, medication(s)/side effects and non-pharmacologic comfort measures Outcome: Progressing   Problem: Clinical Measurements: Goal: Will remain free from infection Outcome: Progressing   Problem: Nutrition: Goal: Adequate nutrition will be maintained Outcome: Progressing   Problem: Elimination: Goal: Will not experience complications related to bowel motility Outcome: Progressing Goal: Will not experience complications related to urinary retention Outcome: Progressing   Problem: Pain Managment: Goal: General experience of comfort will improve and/or be controlled Outcome: Progressing   Problem: Safety: Goal: Ability to remain free from injury will improve Outcome: Progressing

## 2024-12-12 DIAGNOSIS — Z8673 Personal history of transient ischemic attack (TIA), and cerebral infarction without residual deficits: Secondary | ICD-10-CM | POA: Diagnosis not present

## 2024-12-12 DIAGNOSIS — C679 Malignant neoplasm of bladder, unspecified: Secondary | ICD-10-CM | POA: Diagnosis not present

## 2024-12-12 DIAGNOSIS — I723 Aneurysm of iliac artery: Secondary | ICD-10-CM | POA: Diagnosis not present

## 2024-12-12 DIAGNOSIS — T83091A Other mechanical complication of indwelling urethral catheter, initial encounter: Secondary | ICD-10-CM | POA: Diagnosis not present

## 2024-12-12 MED ORDER — TAMSULOSIN HCL 0.4 MG PO CAPS: 0.8000 mg | ORAL_CAPSULE | Freq: Every day | ORAL | Status: AC

## 2024-12-12 MED ORDER — TAMSULOSIN HCL 0.4 MG PO CAPS
0.8000 mg | ORAL_CAPSULE | Freq: Every day | ORAL | Status: DC
  Administered 2024-12-12: 0.8 mg via ORAL
  Filled 2024-12-12: qty 2

## 2024-12-12 NOTE — Plan of Care (Signed)
   Problem: Education: Goal: Knowledge of General Education information will improve Description: Including pain rating scale, medication(s)/side effects and non-pharmacologic comfort measures Outcome: Progressing   Problem: Clinical Measurements: Goal: Will remain free from infection Outcome: Progressing   Problem: Clinical Measurements: Goal: Diagnostic test results will improve Outcome: Progressing

## 2024-12-12 NOTE — Progress Notes (Signed)
 DC instructions and packet provided to pt. All questions answered.

## 2024-12-12 NOTE — Discharge Summary (Signed)
 " Physician Discharge Summary   Jonathan Lane FMW:[id number] DOB: 15-Mar-1941 DOA: 12/07/2024  PCP: Chrystal Lamarr RAMAN, MD  Admit date: 12/07/2024 Discharge date: 12/12/2024  Admitted From: Home Disposition: Home Discharging physician: Alm Apo, MD Barriers to discharge: None  Recommendations at discharge: Follow-up with urology   Discharge Condition: stable CODE STATUS: Full  Diet recommendation:  Diet Orders (From admission, onward)     Start     Ordered   12/10/24 1637  Diet Carb Modified  Diet effective now       Question Answer Comment  Calorie Level Medium 1600-2000   Fluid consistency: Thin      12/10/24 1636            Hospital Course: Jonathan Lane is a 83 y.o. 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   This is his 4th ED visit since 12/04/24.  He had a cystoscopy on 12/04/24. This was his routine 6 month surveillance cystoscopy.  He states they found 3 lesions Thursday and is scheduled for surgery in January.  He was sent home without a catheter. He was fine after this until about 8pm, then was unable to urinate. He came to ED evening of 12/04/24. He then had a foley placed at this time with irrigation and was sent home with foley. He states on Saturday he was unable to clear out the clots in the foley. He came back to ED and catheter was irrigated. He was sent home with irrigation supplies.  Sunday morning he said there were so many blood clots that the port wouldn't stay clear so he came back to ED. He had irrigation again and was trained on irrigation and sent home. He states he went home and even though he was irrigating the urge to urinate was so strong that urine was coming out of his penis. This happened 2-3 times and was bloody urine. He called on call [facility] and was told  he was irrigating out of the wrong port. She advised him to come back to ED. No fevers /chills. No N/V/D. He has had decreased PO intake.    Last took plavix  12/20.   He was started on CBI on admission and urology was consulted. He ultimately underwent TURBT on 12/10/2024.  Assessment and Plan: * Complication, blocked Foley catheter, initial encounter-resolved as of 12/12/2024 - hx bladder and left ureteral cancer s/p urinary retention and foley placement on 12/18 after routine cystoscopy found to have blocked foley catheter -urology consulted -continue CBI given ongoing hematuria and abdominal discomfort when paused  -last took plavix  12/20; continue holding - cardiology clearance completed; no issues for proceeding with procedure  - s/p cystoscopy and TURBT on 12/10/2024 - foley removed 12/25; monitor for voiding ability and PVR as needed - Plavix  to remain on hold for 10 days postop - Patient able to void well spontaneously prior to discharge  Aneurysm of right common iliac artery - follows with [facility]; last seen Sept 2023 - per last note, no intervention would be considered unless aneurysm reached 3.5 cm and last measurement was 1.5 cm; he was recommended for repeat imaging in about 2 years from last visit -He has undergone repeat imaging on 10/17/2024.  Right CIA mid and distal measuring 1.5 and 1.6 cm respectively; largest transverse diameter 1.6 cm distal right CIA -He  was evaluated by vascular surgery also on 10/17/2024.  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  MRI negative at that time - He was recommended for DAPT then monotherapy plavix  thereafter and has remained compliant - last dose plavix  12/20; now on hold, see above   History of DVT (deep vein thrombosis)-resolved as of 12/09/2024 History of DVT,  believed to be provoked Completed course of eliquis .   Bladder cancer (HCC) Hx of bladder cancer and left ureteral cancer followed by urology  No chemo or radiation  Routine cystoscopy surveillance on 12/18 with 3 abnormal lesions - Further management as per urology   Hypokalemia - Replete and trend   Gastroesophageal reflux disease Continue PPI   OSA on CPAP Continue cpap at night   Essential hypertension - Home regimen continued at discharge   The patient's acute and chronic medical conditions were treated accordingly. On day of discharge, patient was felt deemed stable for discharge. Patient/family member advised to call PCP or come back to ER if needed.   Principal Diagnosis: Complication, blocked Foley catheter, initial encounter  Discharge Diagnoses: Active Hospital Problems   Diagnosis Date Noted   Aneurysm of right common iliac artery 12/08/2024    Priority: 2.   History of TIA (transient ischemic attack) 08/25/2019    Priority: 2.   Bladder cancer (HCC) 01/03/2022    Priority: 4.   Preoperative cardiovascular examination 12/08/2024   Anticoagulation management encounter 12/08/2024   Hypokalemia 08/18/2019   Gastroesophageal reflux disease 05/22/2018   OSA on CPAP 10/11/2015   Essential hypertension 06/02/2009    Resolved Hospital Problems   Diagnosis Date Noted Date Resolved   Complication, blocked Foley catheter, initial encounter 12/07/2024 12/12/2024    Priority: 1.   History of DVT (deep vein thrombosis) 12/07/2024 12/09/2024    Priority: 3.     Discharge Instructions     Increase activity slowly   Complete by: As directed       Allergies as of 12/12/2024       Reactions   Brimonidine Tartrate-timolol  Itching, Rash, Other (See Comments)   Itching eyes Other reaction(s): Not available   Brimonidine Tartrate Other (See Comments)   Irritates the eyes and increased pressure   Tetracycline Hcl Other (See Comments)   Doesn't remember  reaction-like 55 years ago   Ciprofloxacin Other (See Comments)   Constipation        Medication List     PAUSE taking these medications    clopidogrel  75 MG tablet Wait to take this until: December 18, 2024 Commonly known as: PLAVIX  Take 75 mg by mouth daily.       STOP taking these medications    traMADol  50 MG tablet Commonly known as: Ultram        TAKE these medications    acetaminophen  500 MG tablet Commonly known as: TYLENOL  Take 500 mg by mouth every 8 (eight) hours as needed for moderate pain (pain score 4-6) or mild pain (pain score 1-3).   ADULT GUMMY PO Take 2 capsules by mouth daily.   PreserVision AREDS 2+Multi Vit Caps Take 1 capsule by mouth 2 times daily at 12 noon and 4 pm.   atorvastatin  20 MG tablet Commonly known as: LIPITOR  Take 1 tablet (20 mg total) by mouth daily. What changed: when to take this   Coenzyme  Q-10 200 MG Caps Take 200 mg by mouth daily.   Cosopt  PF 2-0.5 % Soln Generic drug: Dorzolamide  HCl-Timolol  Mal PF Place 1 drop into both eyes in the morning and at bedtime.   CRANBERRY PO Take 650 mg by mouth daily.   cyanocobalamin 1000 MCG tablet Commonly known as: VITAMIN B12 Take 1,000 mcg by mouth daily.   diltiazem  240 MG 24 hr capsule Commonly known as: Dilt-XR Take 1 capsule (240 mg total) by mouth daily. What changed: when to take this   docusate sodium  100 MG capsule Commonly known as: COLACE Take 1 capsule (100 mg total) by mouth 2 (two) times daily. What changed:  how much to take when to take this   esomeprazole  20 MG capsule Commonly known as: NEXIUM  Take 20 mg by mouth 2 (two) times daily before a meal.   Fish Oil 1200 MG Caps Take 1,200 mg by mouth 2 (two) times daily.   fluticasone  50 MCG/ACT nasal spray Commonly known as: FLONASE  Place 2 sprays into both nostrils daily as needed for allergies or rhinitis.   losartan -hydrochlorothiazide  100-25 MG tablet Commonly known as: HYZAAR Take 1  tablet by mouth daily. What changed: when to take this   Melatonin 3 MG Caps Take 6 capsules by mouth at bedtime.   nitroGLYCERIN  0.4 MG SL tablet Commonly known as: NITROSTAT  Place 1 tablet (0.4 mg total) under the tongue every 5 (five) minutes as needed for chest pain.   NON FORMULARY Pt uses a cpap nightly   OVER THE COUNTER MEDICATION Take 25 mg by mouth daily. Saffron 28 mg   Prevagen Extra Strength 20 MG Caps Generic drug: Apoaequorin Take 1 capsule by mouth daily.   Prostate Health Caps Take 1 capsule by mouth daily.   Rhopressa  0.02 % Soln Generic drug: Netarsudil  Dimesylate Place 1 drop into both eyes at bedtime.   senna-docusate 8.6-50 MG tablet Commonly known as: Senokot-S Take 1 tablet by mouth 2 (two) times daily. While taking prescription pain meds to prevent constipation   tadalafil  5 MG tablet Commonly known as: CIALIS  Take 5 mg by mouth daily.   tamsulosin  0.4 MG Caps capsule Commonly known as: FLOMAX  Take 2 capsules (0.8 mg total) by mouth daily. What changed: how much to take   valACYclovir 1000 MG tablet Commonly known as: VALTREX Take 1,000 mg by mouth 2 (two) times daily as needed (for outbreaks).        Follow-up Information     Alvaro Ricardo KATHEE Raddle., MD Follow up on 01/13/2025.   Specialty: Urology Why: at 11:15 for MD visit Contact information: [address] [place] [address] [phone]                Allergies[1]  Consultations: Urology   Procedures: 12/24: PROCEDURES: 1- Trasurethral Resection of Bladder Tumor - Large 2 - Bilateral Retrograde Pyelograms with interpretation 3 - LEFT Diagnostic ureteroscopy  Discharge Exam: BP (!) 119/49 (BP Location: Right Arm)   Pulse (!) 59   Temp 98.8 F (37.1 C)   Resp 20   Ht 6' 1 (1.854 m)   Wt 91.6 kg   SpO2 93%   BMI 26.64 kg/m  Physical Exam Constitutional:      General: He is not in acute distress.    Appearance: Normal appearance.  HENT:     Head:  Normocephalic and atraumatic.     Mouth/Throat:     Mouth: Mucous membranes are moist.  Eyes:     Extraocular Movements:  Extraocular movements intact.  Cardiovascular:     Rate and Rhythm: Normal rate and regular rhythm.  Pulmonary:     Effort: Pulmonary effort is normal. No respiratory distress.     Breath sounds: Normal breath sounds. No wheezing.  Abdominal:     General: Bowel sounds are normal. There is no distension.     Palpations: Abdomen is soft.     Tenderness: There is no abdominal tenderness.  Genitourinary:    Comments: Light pink-tinged urine noted in Foley bag Musculoskeletal:        General: Normal range of motion.     Cervical back: Normal range of motion and neck supple.  Skin:    General: Skin is warm and dry.  Neurological:     General: No focal deficit present.     Mental Status: He is alert.  Psychiatric:        Mood and Affect: Mood normal.        Behavior: Behavior normal.      The results of significant diagnostics from this hospitalization (including imaging, microbiology, ancillary and laboratory) are listed below for reference.   Microbiology: No results found for this or any previous visit (from the past 240 hours).   Labs: BNP (last 3 results) No results for input(s): BNP in the last 8760 hours. Basic Metabolic Panel: Recent Labs  Lab 12/07/24 2111 12/07/24 2226 12/08/24 0453 12/09/24 0545 12/11/24 0556  NA 139  --  139 140 139  K 3.4*  --  3.2* 3.1* 3.8  CL 103  --  103 105 102  CO2 26  --  28 26 29   GLUCOSE 117*  --  127* 107* 99  BUN 22  --  20 16 14   CREATININE 0.82  --  0.81 0.76 0.86  CALCIUM  9.2  --  8.8* 8.9 9.0  MG  --  1.9  --   --  2.0   Liver Function Tests: No results for input(s): AST, ALT, ALKPHOS, BILITOT, PROT, ALBUMIN in the last 168 hours. No results for input(s): LIPASE, AMYLASE in the last 168 hours. No results for input(s): AMMONIA in the last 168 hours. CBC: Recent Labs  Lab  12/07/24 2111 12/08/24 0453 12/11/24 0556  WBC 8.2 6.6 7.5  NEUTROABS  --   --  4.7  HGB 13.0 11.2* 11.9*  HCT 38.8* 33.4* 36.3*  MCV 95.1 94.9 96.8  PLT 184 165 165   Cardiac Enzymes: No results for input(s): CKTOTAL, CKMB, CKMBINDEX, TROPONINI in the last 168 hours. BNP: Invalid input(s): POCBNP CBG: No results for input(s): GLUCAP in the last 168 hours. D-Dimer No results for input(s): DDIMER in the last 72 hours. Hgb A1c No results for input(s): HGBA1C in the last 72 hours. Lipid Profile No results for input(s): CHOL, HDL, LDLCALC, TRIG, CHOLHDL, LDLDIRECT in the last 72 hours. Thyroid  function studies No results for input(s): TSH, T4TOTAL, T3FREE, THYROIDAB in the last 72 hours.  Invalid input(s): FREET3 Anemia work up No results for input(s): VITAMINB12, FOLATE, FERRITIN, TIBC, IRON, RETICCTPCT in the last 72 hours. Urinalysis    Component Value Date/Time   COLORURINE YELLOW 10/05/2023 1742   APPEARANCEUR CLEAR 10/05/2023 1742   LABSPEC 1.025 10/05/2023 1742   PHURINE 7.5 10/05/2023 1742   GLUCOSEU NEGATIVE 10/05/2023 1742   HGBUR NEGATIVE 10/05/2023 1742   BILIRUBINUR SMALL (A) 10/05/2023 1742   KETONESUR NEGATIVE 10/05/2023 1742   PROTEINUR 30 (A) 10/05/2023 1742   NITRITE NEGATIVE 10/05/2023 1742   LEUKOCYTESUR NEGATIVE 10/05/2023  1742   Sepsis Labs Recent Labs  Lab 12/07/24 2111 12/08/24 0453 12/11/24 0556  WBC 8.2 6.6 7.5   Microbiology No results found for this or any previous visit (from the past 240 hours).  Procedures/Studies: DG C-Arm 1-60 Min-No Report Result Date: 12/10/2024 Fluoroscopy was utilized by the requesting physician.  No radiographic interpretation.     Time coordinating discharge: Over 30 minutes    Alm Apo, MD  [facility] 12/12/2024, 4:16 PM    [1]  Allergies Allergen Reactions   Brimonidine Tartrate-Timolol  Itching, Rash and Other (See Comments)     Itching eyes Other reaction(s): Not available   Brimonidine Tartrate Other (See Comments)    Irritates the eyes and increased pressure   Tetracycline Hcl Other (See Comments)    Doesn't remember reaction-like 55 years ago    Ciprofloxacin Other (See Comments)    Constipation   "

## 2024-12-12 NOTE — Progress Notes (Addendum)
 "  Urology Inpatient Progress Report  Gross hematuria [R31.0] Complication, blocked Foley catheter, initial encounter [T83.091A] Obstructed Foley catheter, initial encounter [T83.091A] Procedures: TURBT (TRANSURETHRAL RESECTION OF BLADDER TUMOR) CYSTOSCOPY/RETROGRADE/URETEROSCOPY 2 Days Post-Op  Intv/Subj: No acute events overnight. Patient is without complaint. Voiding on his own, emptying fairly well, urine straw colored.  Principal Problem:   Complication, blocked Foley catheter, initial encounter Active Problems:   Essential hypertension   OSA on CPAP   Gastroesophageal reflux disease   Hypokalemia   History of TIA (transient ischemic attack)   Bladder cancer (HCC)   Aneurysm of right common iliac artery   Preoperative cardiovascular examination   Anticoagulation management encounter  Current Facility-Administered Medications  Medication Dose Route Frequency Provider Last Rate Last Admin   acetaminophen  (TYLENOL ) tablet 650 mg  650 mg Oral Q6H PRN Waddell Rake, MD   650 mg at 12/12/24 0221   Or   acetaminophen  (TYLENOL ) suppository 650 mg  650 mg Rectal Q6H PRN Waddell Rake, MD       atorvastatin  (LIPITOR ) tablet 20 mg  20 mg Oral QHS Waddell Rake, MD   20 mg at 12/11/24 2159   Chlorhexidine  Gluconate Cloth 2 % PADS 6 each  6 each Topical Daily Patsy Lenis, MD   6 each at 12/12/24 9053   diltiazem  (CARDIZEM  CD) 24 hr capsule 240 mg  240 mg Oral QHS Waddell Rake, MD   240 mg at 12/11/24 2159   docusate sodium  (COLACE) capsule 200 mg  200 mg Oral Daily Waddell Rake, MD   200 mg at 12/12/24 0945   losartan  (COZAAR ) tablet 100 mg  100 mg Oral QHS Waddell Rake, MD   100 mg at 12/11/24 2159   And   hydrochlorothiazide  (HYDRODIURIL ) tablet 25 mg  25 mg Oral QHS Waddell Rake, MD   25 mg at 12/11/24 2159   lactulose  (CHRONULAC ) 10 GM/15ML solution 30 g  30 g Oral BID PRN Patsy Lenis, MD       melatonin tablet 5 mg  5 mg Oral QHS Waddell Rake, MD   5 mg at  12/11/24 2200   morphine  (PF) 2 MG/ML injection 2 mg  2 mg Intravenous Q3H PRN Patsy Lenis, MD   2 mg at 12/11/24 0157   ondansetron  (ZOFRAN ) tablet 4 mg  4 mg Oral Q6H PRN Waddell Rake, MD       Or   ondansetron  (ZOFRAN ) injection 4 mg  4 mg Intravenous Q6H PRN Waddell Rake, MD   4 mg at 12/11/24 0158   oxyCODONE  (Oxy IR/ROXICODONE ) immediate release tablet 5 mg  5 mg Oral Q4H PRN Patsy Lenis, MD       pantoprazole  (PROTONIX ) EC tablet 40 mg  40 mg Oral Daily Waddell Rake, MD   40 mg at 12/12/24 0945   polyethylene glycol (MIRALAX  / GLYCOLAX ) packet 17 g  17 g Oral Daily Patsy Lenis, MD   17 g at 12/12/24 0945   tadalafil  (CIALIS ) tablet 5 mg  5 mg Oral Daily Waddell Rake, MD   5 mg at 12/12/24 0945   tamsulosin  (FLOMAX ) capsule 0.8 mg  0.8 mg Oral Daily Blondie Lynwood POUR, NP   0.8 mg at 12/12/24 9386     Objective: Vital: Vitals:   12/11/24 0459 12/11/24 1442 12/11/24 1917 12/12/24 0503  BP: 117/68 134/63 (!) 153/75 (!) 119/49  Pulse: (!) 55 63 73 (!) 59  Resp: 18 19 20 20   Temp: 98.5 F (36.9 C) 98.2 F (36.8 C) 98.2 F (36.8  C) 98.8 F (37.1 C)  TempSrc:      SpO2: 98% 94% 94% 93%  Weight:      Height:       I/Os: I/O last 3 completed shifts: In: -  Out: 2420 [Urine:2420]  Physical Exam:  General: Patient is in no apparent distress Lungs: Normal respiratory effort, chest expands symmetrically. GI:  The abdomen is soft and nontender without mass. Foley: out, urine straw colored in urinal  Ext: lower extremities symmetric  Lab Results: Recent Labs    12/11/24 0556  WBC 7.5  HGB 11.9*  HCT 36.3*   Recent Labs    12/11/24 0556  NA 139  K 3.8  CL 102  CO2 29  GLUCOSE 99  BUN 14  CREATININE 0.86  CALCIUM  9.0   No results for input(s): LABPT, INR in the last 72 hours. No results for input(s): LABURIN in the last 72 hours. Results for orders placed or performed during the hospital encounter of 10/05/23  Blood culture (routine x 2)      Status: None   Collection Time: 10/05/23  5:50 PM   Specimen: BLOOD  Result Value Ref Range Status   Specimen Description   Final    BLOOD LEFT ANTECUBITAL Performed at [facility], [address] [place], [place] [address]    Special Requests   Final    BOTTLES DRAWN AEROBIC AND ANAEROBIC Blood Culture results may not be optimal due to an excessive volume of blood received in culture bottles Performed at [facility], [address], [place], [place] [address]    Culture   Final    NO GROWTH 5 DAYS Performed at [facility], [address], [place], [place] [address]    Report Status 10/11/2023 FINAL  Final  Blood culture (routine x 2)     Status: None   Collection Time: 10/05/23  8:46 PM   Specimen: BLOOD  Result Value Ref Range Status   Specimen Description   Final    BLOOD LEFT ANTECUBITAL Performed at [facility], [address] [place], [place] [address]    Special Requests   Final    BOTTLES DRAWN AEROBIC AND ANAEROBIC Blood Culture results may not be optimal due to an excessive volume of blood received in culture bottles Performed at [facility], [address] [place], [place] [address]    Culture   Final    NO GROWTH 5 DAYS Performed at [facility], [address] [place], [place] [address]    Report Status 10/11/2023 FINAL  Final    Studies/Results: DG C-Arm 1-60 Min-No Report Result Date: 12/10/2024 Fluoroscopy was utilized by the requesting physician.  No radiographic interpretation.    Assessment: Procedures: TURBT (TRANSURETHRAL RESECTION OF BLADDER TUMOR) CYSTOSCOPY/RETROGRADE/URETEROSCOPY, 2 Days Post-Op  doing well.  Plan: Catheter out, voiding on his own, hematuria resolving. Would be fine to discharge today.  Will f/u with urology [place]) as scheduled. Recommended holding his plavix  for 10 days post-op, which the patient is willing to  do.   Morene Salines, MD Urology 12/12/2024, 10:27 AM  "

## 2024-12-15 LAB — SURGICAL PATHOLOGY

## 2024-12-29 ENCOUNTER — Encounter (INDEPENDENT_AMBULATORY_CARE_PROVIDER_SITE_OTHER): Payer: Self-pay | Admitting: Otolaryngology

## 2024-12-29 ENCOUNTER — Ambulatory Visit (INDEPENDENT_AMBULATORY_CARE_PROVIDER_SITE_OTHER): Admitting: Otolaryngology

## 2024-12-29 VITALS — HR 60 | Wt 200.0 lb

## 2024-12-29 DIAGNOSIS — H6123 Impacted cerumen, bilateral: Secondary | ICD-10-CM

## 2024-12-29 DIAGNOSIS — H903 Sensorineural hearing loss, bilateral: Secondary | ICD-10-CM | POA: Diagnosis not present

## 2024-12-29 NOTE — Progress Notes (Signed)
 Patient ID: Jonathan Lane, male   DOB: [birth year], 84 y.o.   MRN: [id number]  Follow-up: Progressive hearing loss  HPI: The patient is an 84 year old male who presents today complaining of bilateral progressive hearing loss.  He has a history of bilateral high-frequency sensorineural hearing loss, consistent with presbycusis.  He has never worn hearing aids.  The patient returns today complaining of increasing hearing difficulty, especially in noisy environments.  He denies any otalgia, otorrhea, or vertigo.  Exam: General: Communicates without difficulty, well nourished, no acute distress. Head: Normocephalic, no evidence injury, no tenderness, facial buttresses intact without stepoff. Face/sinus: No tenderness to palpation and percussion. Facial movement is normal and symmetric. Eyes: PERRL, EOMI. No scleral icterus, conjunctivae clear. Neuro: CN II exam reveals vision grossly intact.  No nystagmus at any point of gaze. EAC: Bilateral cerumen impaction.  Under the operating microscope, the cerumen is carefully removed with a combination of cerumen currette, alligator forceps, and suction catheters.  After the cerumen is removed, the TMs are noted to be normal. Nose: External evaluation reveals normal support and skin without lesions.  Dorsum is intact.  Anterior rhinoscopy reveals pink mucosa over anterior aspect of inferior turbinates and intact septum.  No purulence noted. Oral:  Oral cavity and oropharynx are intact, symmetric, without erythema or edema.  Mucosa is moist without lesions. Neck: Full range of motion without pain.  There is no significant lymphadenopathy.  No masses palpable.  Thyroid  bed within normal limits to palpation.  Parotid glands and submandibular glands equal bilaterally without mass.  Trachea is midline. Neuro:  CN 2-12 grossly intact. Gait normal.   Procedure: Bilateral cerumen disimpaction Anesthesia: None Description: Under the operating microscope, the cerumen is  carefully removed with a combination of cerumen currette, alligator forceps, and suction catheters.  After the cerumen is removed, the TMs are noted to be normal.  No mass, erythema, or lesions. The patient tolerated the procedure well.  Assessment: 1.  Bilateral cerumen impaction.  After the disimpaction procedure, both tympanic membranes and middle ear spaces are noted to be normal. 2.  Bilateral high-frequency sensorineural hearing loss, secondary to presbycusis.    Plan: 1.  Otomicroscopy with bilateral cerumen disimpaction. 2.  The physical exam findings are reviewed with the patient. 3.  The patient is a candidate for hearing amplification.  The hearing aid options are discussed. 4.  The patient will return for reevaluation in 6 months.

## 2024-12-31 ENCOUNTER — Other Ambulatory Visit: Payer: Self-pay | Admitting: Cardiovascular Disease

## 2025-01-01 NOTE — Telephone Encounter (Signed)
 In accordance with refill protocols, please review and address the following requirements before this medication refill can be authorized:  Labs

## 2025-01-08 ENCOUNTER — Ambulatory Visit (HOSPITAL_COMMUNITY): Admit: 2025-01-08 | Admitting: Urology

## 2025-01-08 SURGERY — TURBT (TRANSURETHRAL RESECTION OF BLADDER TUMOR)
Anesthesia: General

## 2025-02-17 ENCOUNTER — Telehealth: Admitting: Adult Health

## 2025-03-10 ENCOUNTER — Telehealth: Admitting: Adult Health

## 2025-04-10 ENCOUNTER — Telehealth: Admitting: Adult Health

## 2025-07-01 ENCOUNTER — Ambulatory Visit (INDEPENDENT_AMBULATORY_CARE_PROVIDER_SITE_OTHER): Admitting: Otolaryngology
